# Patient Record
Sex: Female | Born: 1935 | Race: White | Hispanic: No | Smoking: Never smoker
Health system: Southern US, Community
[De-identification: ages and names within clinical notes are randomized; demographics above are authoritative.]

## PROBLEM LIST (undated history)

## (undated) DIAGNOSIS — R42 Dizziness and giddiness: Secondary | ICD-10-CM

## (undated) DIAGNOSIS — E785 Hyperlipidemia, unspecified: Secondary | ICD-10-CM

## (undated) DIAGNOSIS — R112 Nausea with vomiting, unspecified: Secondary | ICD-10-CM

## (undated) DIAGNOSIS — Z9889 Other specified postprocedural states: Secondary | ICD-10-CM

## (undated) DIAGNOSIS — M8580 Other specified disorders of bone density and structure, unspecified site: Secondary | ICD-10-CM

## (undated) DIAGNOSIS — C029 Malignant neoplasm of tongue, unspecified: Secondary | ICD-10-CM

## (undated) DIAGNOSIS — Z860100 Personal history of colon polyps, unspecified: Secondary | ICD-10-CM

## (undated) DIAGNOSIS — E039 Hypothyroidism, unspecified: Secondary | ICD-10-CM

## (undated) DIAGNOSIS — H353190 Nonexudative age-related macular degeneration, unspecified eye, stage unspecified: Secondary | ICD-10-CM

## (undated) DIAGNOSIS — R7303 Prediabetes: Secondary | ICD-10-CM

## (undated) HISTORY — DX: Personal history of colonic polyps: Z86.010

## (undated) HISTORY — DX: Malignant neoplasm of tongue, unspecified: C02.9

## (undated) HISTORY — PX: TONSILLECTOMY AND ADENOIDECTOMY: SUR1326

## (undated) HISTORY — DX: Hyperlipidemia, unspecified: E78.5

## (undated) HISTORY — PX: COLONOSCOPY W/ POLYPECTOMY: SHX1380

## (undated) HISTORY — DX: Personal history of colon polyps, unspecified: Z86.0100

## (undated) HISTORY — PX: PILONIDAL CYST EXCISION: SHX744

## (undated) HISTORY — PX: TOTAL ABDOMINAL HYSTERECTOMY: SHX209

## (undated) HISTORY — DX: Other specified disorders of bone density and structure, unspecified site: M85.80

## (undated) HISTORY — DX: Hypothyroidism, unspecified: E03.9

## (undated) HISTORY — DX: Nonexudative age-related macular degeneration, unspecified eye, stage unspecified: H35.3190

---

## 1998-02-22 ENCOUNTER — Other Ambulatory Visit: Admission: RE | Admit: 1998-02-22 | Discharge: 1998-02-22 | Payer: Self-pay | Admitting: Obstetrics and Gynecology

## 1999-07-12 ENCOUNTER — Other Ambulatory Visit: Admission: RE | Admit: 1999-07-12 | Discharge: 1999-07-12 | Payer: Self-pay | Admitting: Obstetrics and Gynecology

## 2000-11-06 ENCOUNTER — Other Ambulatory Visit: Admission: RE | Admit: 2000-11-06 | Discharge: 2000-11-06 | Payer: Self-pay | Admitting: Obstetrics and Gynecology

## 2001-11-07 ENCOUNTER — Other Ambulatory Visit: Admission: RE | Admit: 2001-11-07 | Discharge: 2001-11-07 | Payer: Self-pay | Admitting: Obstetrics and Gynecology

## 2004-09-22 ENCOUNTER — Ambulatory Visit: Payer: Self-pay | Admitting: Internal Medicine

## 2004-10-04 ENCOUNTER — Ambulatory Visit: Payer: Self-pay | Admitting: Internal Medicine

## 2005-07-06 ENCOUNTER — Ambulatory Visit: Payer: Self-pay | Admitting: Internal Medicine

## 2005-10-06 ENCOUNTER — Ambulatory Visit: Payer: Self-pay | Admitting: Internal Medicine

## 2006-10-01 ENCOUNTER — Ambulatory Visit: Payer: Self-pay | Admitting: Internal Medicine

## 2006-10-01 LAB — CONVERTED CEMR LAB
ALT: 20 units/L (ref 0–40)
AST: 25 units/L (ref 0–37)
Total CHOL/HDL Ratio: 2.7

## 2006-12-19 ENCOUNTER — Ambulatory Visit: Payer: Self-pay | Admitting: Internal Medicine

## 2007-02-15 ENCOUNTER — Telehealth (INDEPENDENT_AMBULATORY_CARE_PROVIDER_SITE_OTHER): Payer: Self-pay | Admitting: *Deleted

## 2007-02-18 DIAGNOSIS — M8580 Other specified disorders of bone density and structure, unspecified site: Secondary | ICD-10-CM

## 2007-03-12 ENCOUNTER — Encounter: Payer: Self-pay | Admitting: Internal Medicine

## 2007-07-15 ENCOUNTER — Telehealth (INDEPENDENT_AMBULATORY_CARE_PROVIDER_SITE_OTHER): Payer: Self-pay | Admitting: *Deleted

## 2007-08-29 ENCOUNTER — Telehealth (INDEPENDENT_AMBULATORY_CARE_PROVIDER_SITE_OTHER): Payer: Self-pay | Admitting: *Deleted

## 2007-09-12 HISTORY — PX: CATARACT EXTRACTION: SUR2

## 2007-11-19 ENCOUNTER — Ambulatory Visit: Payer: Self-pay | Admitting: Internal Medicine

## 2007-11-19 DIAGNOSIS — E782 Mixed hyperlipidemia: Secondary | ICD-10-CM

## 2007-11-19 DIAGNOSIS — E7800 Pure hypercholesterolemia, unspecified: Secondary | ICD-10-CM | POA: Insufficient documentation

## 2007-11-19 DIAGNOSIS — E039 Hypothyroidism, unspecified: Secondary | ICD-10-CM

## 2007-11-26 ENCOUNTER — Encounter (INDEPENDENT_AMBULATORY_CARE_PROVIDER_SITE_OTHER): Payer: Self-pay | Admitting: *Deleted

## 2008-01-23 ENCOUNTER — Telehealth (INDEPENDENT_AMBULATORY_CARE_PROVIDER_SITE_OTHER): Payer: Self-pay | Admitting: *Deleted

## 2008-02-14 ENCOUNTER — Telehealth (INDEPENDENT_AMBULATORY_CARE_PROVIDER_SITE_OTHER): Payer: Self-pay | Admitting: *Deleted

## 2008-03-06 ENCOUNTER — Encounter: Payer: Self-pay | Admitting: Internal Medicine

## 2008-03-18 ENCOUNTER — Telehealth (INDEPENDENT_AMBULATORY_CARE_PROVIDER_SITE_OTHER): Payer: Self-pay | Admitting: *Deleted

## 2008-04-27 ENCOUNTER — Telehealth (INDEPENDENT_AMBULATORY_CARE_PROVIDER_SITE_OTHER): Payer: Self-pay | Admitting: *Deleted

## 2008-06-18 ENCOUNTER — Ambulatory Visit: Payer: Self-pay | Admitting: Internal Medicine

## 2008-10-20 ENCOUNTER — Telehealth (INDEPENDENT_AMBULATORY_CARE_PROVIDER_SITE_OTHER): Payer: Self-pay | Admitting: *Deleted

## 2008-10-30 ENCOUNTER — Telehealth (INDEPENDENT_AMBULATORY_CARE_PROVIDER_SITE_OTHER): Payer: Self-pay | Admitting: *Deleted

## 2008-10-30 ENCOUNTER — Encounter: Payer: Self-pay | Admitting: Internal Medicine

## 2008-11-06 ENCOUNTER — Ambulatory Visit: Payer: Self-pay | Admitting: Internal Medicine

## 2008-11-06 DIAGNOSIS — T887XXA Unspecified adverse effect of drug or medicament, initial encounter: Secondary | ICD-10-CM | POA: Insufficient documentation

## 2008-11-06 LAB — CONVERTED CEMR LAB
Glucose, Urine, Semiquant: NEGATIVE
Specific Gravity, Urine: <1.005
WBC Urine, dipstick: NEGATIVE
pH: 7.5

## 2008-11-11 DIAGNOSIS — Z9849 Cataract extraction status, unspecified eye: Secondary | ICD-10-CM

## 2008-11-16 LAB — CONVERTED CEMR LAB
AST: 25 units/L (ref 0–37)
Albumin: 3.9 g/dL (ref 3.5–5.2)
Alkaline Phosphatase: 54 units/L (ref 39–117)
BUN: 16 mg/dL (ref 6–23)
Basophils Relative: 0.3 % (ref 0.0–3.0)
Chloride: 97 meq/L (ref 96–112)
Creatinine, Ser: 0.8 mg/dL (ref 0.4–1.2)
Eosinophils Absolute: 0.2 10*3/uL (ref 0.0–0.7)
Eosinophils Relative: 3 % (ref 0.0–5.0)
GFR calc non Af Amer: 74 mL/min
Glucose, Bld: 95 mg/dL (ref 70–99)
HCT: 37.5 % (ref 36.0–46.0)
MCV: 95.3 fL (ref 78.0–100.0)
Monocytes Absolute: 0.7 10*3/uL (ref 0.1–1.0)
Monocytes Relative: 9.6 % (ref 3.0–12.0)
RBC: 3.94 M/uL (ref 3.87–5.11)
TSH: 1.89 microintl units/mL (ref 0.35–5.50)
Total CHOL/HDL Ratio: 2.9
Total Protein: 7.4 g/dL (ref 6.0–8.3)
WBC: 7.5 10*3/uL (ref 4.5–10.5)

## 2008-11-17 ENCOUNTER — Encounter (INDEPENDENT_AMBULATORY_CARE_PROVIDER_SITE_OTHER): Payer: Self-pay | Admitting: *Deleted

## 2008-11-17 ENCOUNTER — Telehealth (INDEPENDENT_AMBULATORY_CARE_PROVIDER_SITE_OTHER): Payer: Self-pay | Admitting: *Deleted

## 2008-11-17 ENCOUNTER — Ambulatory Visit: Payer: Self-pay | Admitting: Internal Medicine

## 2008-11-17 DIAGNOSIS — Z860100 Personal history of colon polyps, unspecified: Secondary | ICD-10-CM | POA: Insufficient documentation

## 2008-11-17 DIAGNOSIS — E559 Vitamin D deficiency, unspecified: Secondary | ICD-10-CM

## 2009-01-14 ENCOUNTER — Telehealth (INDEPENDENT_AMBULATORY_CARE_PROVIDER_SITE_OTHER): Payer: Self-pay | Admitting: *Deleted

## 2009-03-18 ENCOUNTER — Ambulatory Visit: Payer: Self-pay | Admitting: Internal Medicine

## 2009-03-23 ENCOUNTER — Encounter (INDEPENDENT_AMBULATORY_CARE_PROVIDER_SITE_OTHER): Payer: Self-pay | Admitting: *Deleted

## 2009-03-23 LAB — CONVERTED CEMR LAB
AST: 22 units/L (ref 0–37)
Albumin: 3.8 g/dL (ref 3.5–5.2)
Alkaline Phosphatase: 45 units/L (ref 39–117)
Bilirubin, Direct: 0.1 mg/dL (ref 0.0–0.3)
LDL Cholesterol: 64 mg/dL (ref 0–99)
Total Bilirubin: 0.8 mg/dL (ref 0.3–1.2)
Total CHOL/HDL Ratio: 2
Triglycerides: 107 mg/dL (ref 0.0–149.0)

## 2009-05-31 ENCOUNTER — Telehealth (INDEPENDENT_AMBULATORY_CARE_PROVIDER_SITE_OTHER): Payer: Self-pay | Admitting: *Deleted

## 2009-09-30 ENCOUNTER — Telehealth (INDEPENDENT_AMBULATORY_CARE_PROVIDER_SITE_OTHER): Payer: Self-pay | Admitting: *Deleted

## 2009-11-30 ENCOUNTER — Telehealth (INDEPENDENT_AMBULATORY_CARE_PROVIDER_SITE_OTHER): Payer: Self-pay | Admitting: *Deleted

## 2010-01-07 ENCOUNTER — Telehealth (INDEPENDENT_AMBULATORY_CARE_PROVIDER_SITE_OTHER): Payer: Self-pay | Admitting: *Deleted

## 2010-01-07 ENCOUNTER — Ambulatory Visit: Payer: Self-pay | Admitting: Internal Medicine

## 2010-01-10 ENCOUNTER — Telehealth (INDEPENDENT_AMBULATORY_CARE_PROVIDER_SITE_OTHER): Payer: Self-pay | Admitting: *Deleted

## 2010-01-14 ENCOUNTER — Ambulatory Visit: Payer: Self-pay | Admitting: Internal Medicine

## 2010-01-24 LAB — CONVERTED CEMR LAB
ALT: 17 units/L (ref 0–35)
BUN: 12 mg/dL (ref 6–23)
Basophils Relative: 0.6 % (ref 0.0–3.0)
Bilirubin, Direct: 0 mg/dL (ref 0.0–0.3)
CO2: 31 meq/L (ref 19–32)
Calcium: 9.9 mg/dL (ref 8.4–10.5)
Eosinophils Relative: 3.7 % (ref 0.0–5.0)
Glucose, Bld: 95 mg/dL (ref 70–99)
HCT: 38 % (ref 36.0–46.0)
HDL: 65.4 mg/dL (ref >39.00)
Hemoglobin: 12.7 g/dL (ref 12.0–15.0)
LDL Cholesterol: 65 mg/dL (ref 0–99)
Lymphs Abs: 2.5 10*3/uL (ref 0.7–4.0)
MCV: 97.8 fL (ref 78.0–100.0)
Monocytes Absolute: 0.7 10*3/uL (ref 0.1–1.0)
Monocytes Relative: 10.4 % (ref 3.0–12.0)
Platelets: 246 10*3/uL (ref 150.0–400.0)
RBC: 3.89 M/uL (ref 3.87–5.11)
Sodium: 138 meq/L (ref 135–145)
TSH: 2.16 microintl units/mL (ref 0.35–5.50)
Total Bilirubin: 0.7 mg/dL (ref 0.3–1.2)
Total CHOL/HDL Ratio: 2
VLDL: 24.4 mg/dL (ref 0.0–40.0)
WBC: 6.6 10*3/uL (ref 4.5–10.5)

## 2010-02-14 ENCOUNTER — Telehealth (INDEPENDENT_AMBULATORY_CARE_PROVIDER_SITE_OTHER): Payer: Self-pay | Admitting: *Deleted

## 2010-03-01 ENCOUNTER — Telehealth (INDEPENDENT_AMBULATORY_CARE_PROVIDER_SITE_OTHER): Payer: Self-pay | Admitting: *Deleted

## 2010-03-21 ENCOUNTER — Encounter: Payer: Self-pay | Admitting: Internal Medicine

## 2010-06-22 ENCOUNTER — Ambulatory Visit: Payer: Self-pay | Admitting: Internal Medicine

## 2010-06-22 ENCOUNTER — Telehealth: Payer: Self-pay | Admitting: Internal Medicine

## 2010-07-12 ENCOUNTER — Encounter: Payer: Self-pay | Admitting: Internal Medicine

## 2010-08-15 ENCOUNTER — Encounter: Payer: Self-pay | Admitting: Internal Medicine

## 2010-08-18 ENCOUNTER — Telehealth (INDEPENDENT_AMBULATORY_CARE_PROVIDER_SITE_OTHER): Payer: Self-pay | Admitting: *Deleted

## 2010-08-23 ENCOUNTER — Ambulatory Visit: Payer: Self-pay | Admitting: Family Medicine

## 2010-08-23 ENCOUNTER — Encounter: Payer: Self-pay | Admitting: Internal Medicine

## 2010-08-23 ENCOUNTER — Ambulatory Visit: Payer: Self-pay | Admitting: Internal Medicine

## 2010-08-23 DIAGNOSIS — IMO0002 Reserved for concepts with insufficient information to code with codable children: Secondary | ICD-10-CM

## 2010-08-29 ENCOUNTER — Emergency Department (HOSPITAL_BASED_OUTPATIENT_CLINIC_OR_DEPARTMENT_OTHER)
Admission: EM | Admit: 2010-08-29 | Discharge: 2010-08-29 | Payer: Self-pay | Source: Home / Self Care | Admitting: Emergency Medicine

## 2010-08-29 ENCOUNTER — Ambulatory Visit: Payer: Self-pay | Admitting: Family Medicine

## 2010-08-30 ENCOUNTER — Ambulatory Visit: Payer: Self-pay | Admitting: Internal Medicine

## 2010-09-01 LAB — CONVERTED CEMR LAB
Albumin: 3.2 g/dL — ABNORMAL LOW (ref 3.5–5.2)
Alkaline Phosphatase: 70 units/L (ref 39–117)
Basophils Relative: 0.5 % (ref 0.0–3.0)
CO2: 31 meq/L (ref 19–32)
Chloride: 96 meq/L (ref 96–112)
Eosinophils Absolute: 0.3 10*3/uL (ref 0.0–0.7)
Glucose, Bld: 101 mg/dL — ABNORMAL HIGH (ref 70–99)
HCT: 34.9 % — ABNORMAL LOW (ref 36.0–46.0)
HDL: 48.3 mg/dL (ref >39.00)
Hemoglobin: 11.9 g/dL — ABNORMAL LOW (ref 12.0–15.0)
LDL Cholesterol: 53 mg/dL (ref 0–99)
Lymphs Abs: 2.1 10*3/uL (ref 0.7–4.0)
MCHC: 33.9 g/dL (ref 30.0–36.0)
MCV: 96.5 fL (ref 78.0–100.0)
Monocytes Absolute: 1.1 10*3/uL — ABNORMAL HIGH (ref 0.1–1.0)
Neutro Abs: 10 10*3/uL — ABNORMAL HIGH (ref 1.4–7.7)
RBC: 3.62 M/uL — ABNORMAL LOW (ref 3.87–5.11)
RDW: 12.6 % (ref 11.5–14.6)
Sodium: 137 meq/L (ref 135–145)
Total Bilirubin: 0.5 mg/dL (ref 0.3–1.2)
Total CHOL/HDL Ratio: 2
Triglycerides: 92 mg/dL (ref 0.0–149.0)

## 2010-09-02 ENCOUNTER — Ambulatory Visit: Payer: Self-pay | Admitting: Internal Medicine

## 2010-09-02 DIAGNOSIS — D649 Anemia, unspecified: Secondary | ICD-10-CM | POA: Insufficient documentation

## 2010-09-02 DIAGNOSIS — F411 Generalized anxiety disorder: Secondary | ICD-10-CM | POA: Insufficient documentation

## 2010-09-02 LAB — CONVERTED CEMR LAB
Eosinophils Absolute: 0.1 10*3/uL (ref 0.0–0.7)
Eosinophils Relative: 0.6 % (ref 0.0–5.0)
Folate: 15.3 ng/mL
HCT: 36.2 % (ref 36.0–46.0)
Iron: 34 ug/dL — ABNORMAL LOW (ref 42–145)
Lymphs Abs: 1.6 10*3/uL (ref 0.7–4.0)
MCHC: 33.4 g/dL (ref 30.0–36.0)
MCV: 96.3 fL (ref 78.0–100.0)
Monocytes Absolute: 1.2 10*3/uL — ABNORMAL HIGH (ref 0.1–1.0)
Platelets: 496 10*3/uL — ABNORMAL HIGH (ref 150.0–400.0)
RDW: 12.6 % (ref 11.5–14.6)
WBC: 16.4 10*3/uL — ABNORMAL HIGH (ref 4.5–10.5)

## 2010-09-07 ENCOUNTER — Ambulatory Visit
Admission: RE | Admit: 2010-09-07 | Discharge: 2010-09-07 | Payer: Self-pay | Source: Home / Self Care | Attending: Internal Medicine | Admitting: Internal Medicine

## 2010-09-07 LAB — CONVERTED CEMR LAB
OCCULT 2: NEGATIVE
OCCULT 3: NEGATIVE

## 2010-09-08 ENCOUNTER — Ambulatory Visit: Payer: Self-pay | Admitting: Internal Medicine

## 2010-09-08 ENCOUNTER — Telehealth (INDEPENDENT_AMBULATORY_CARE_PROVIDER_SITE_OTHER): Payer: Self-pay | Admitting: *Deleted

## 2010-09-08 ENCOUNTER — Encounter (INDEPENDENT_AMBULATORY_CARE_PROVIDER_SITE_OTHER): Payer: Self-pay | Admitting: *Deleted

## 2010-09-14 ENCOUNTER — Telehealth: Payer: Self-pay | Admitting: Internal Medicine

## 2010-09-19 ENCOUNTER — Ambulatory Visit
Admission: RE | Admit: 2010-09-19 | Discharge: 2010-09-19 | Payer: Self-pay | Source: Home / Self Care | Attending: Internal Medicine | Admitting: Internal Medicine

## 2010-09-21 ENCOUNTER — Telehealth: Payer: Self-pay | Admitting: Internal Medicine

## 2010-09-23 ENCOUNTER — Encounter: Payer: Self-pay | Admitting: Internal Medicine

## 2010-09-26 ENCOUNTER — Encounter
Admission: RE | Admit: 2010-09-26 | Discharge: 2010-09-26 | Payer: Self-pay | Source: Home / Self Care | Attending: Otolaryngology | Admitting: Otolaryngology

## 2010-10-09 LAB — CONVERTED CEMR LAB
Alkaline Phosphatase: 49 units/L (ref 39–117)
BUN: 16 mg/dL (ref 6–23)
Basophils Relative: 0.6 % (ref 0.0–1.0)
Bilirubin, Direct: 0.1 mg/dL (ref 0.0–0.3)
CO2: 32 meq/L (ref 19–32)
Cholesterol: 147 mg/dL (ref 0–200)
GFR calc Af Amer: 78 mL/min
Glucose, Bld: 96 mg/dL (ref 70–99)
HDL: 65 mg/dL (ref >39.0)
Hemoglobin: 12.8 g/dL (ref 12.0–15.0)
Lymphocytes Relative: 31.4 % (ref 12.0–46.0)
Monocytes Absolute: 0.6 10*3/uL (ref 0.2–0.7)
Monocytes Relative: 9.4 % (ref 3.0–11.0)
Neutro Abs: 4 10*3/uL (ref 1.4–7.7)
Potassium: 4.1 meq/L (ref 3.5–5.1)
Sodium: 140 meq/L (ref 135–145)
TSH: 1.73 microintl units/mL (ref 0.35–5.50)
Total Protein: 7.6 g/dL (ref 6.0–8.3)
VLDL: 26 mg/dL (ref 0–40)
Vit D, 1,25-Dihydroxy: 35 (ref 30–89)

## 2010-10-11 NOTE — Assessment & Plan Note (Signed)
Summary: FLU SHOT///SPH   Nurse Visit  CC: Flu shot./kb   Allergies: No Known Drug Allergies  Orders Added: 1)  Flu Vaccine 3yrs + MEDICARE PATIENTS [Q2039] 2)  Administration Flu vaccine - MCR [G0008]            Flu Vaccine Consent Questions     Do you have a history of severe allergic reactions to this vaccine? no    Any prior history of allergic reactions to egg and/or gelatin? no    Do you have a sensitivity to the preservative Thimersol? no    Do you have a past history of Guillan-Barre Syndrome? no    Do you currently have an acute febrile illness? no    Have you ever had a severe reaction to latex? no    Vaccine information given and explained to patient? yes    Are you currently pregnant? no    Lot Number:[id number]   Exp Date:03/11/2011   Site Given  Left Deltoid IMu 

## 2010-10-11 NOTE — Progress Notes (Signed)
Summary: Zosta vacc  Phone Note Other Incoming   Summary of Call: Patient was in the office for flu shot and has requested Zosta vacc. I made the patient aware to check with her insurance first and then schedule appt. Patient will call back with her insurance recommendations. Initial call taken by: Lucious Groves CMA,  June 22, 2010 9:17 AM

## 2010-10-11 NOTE — Assessment & Plan Note (Signed)
Summary: tetnus inj/cbs  Nurse Visit   Allergies: No Known Drug Allergies  Immunizations Administered:  Tetanus Vaccine:    Vaccine Type: Tdap    Site: right deltoid    Mfr: GlaxoSmithKline    Dose: 0.5 ml    Route: IM    Given by: Jeremy Johann CMA    Exp. Date: 12/04/2011    Lot #: [id number]    VIS given: 07/30/07 version given January 07, 2010.  Orders Added: 1)  Tdap => 56yrs IM [90715] 2)  Admin 1st Vaccine [09811]

## 2010-10-11 NOTE — Progress Notes (Signed)
Summary: refill  Phone Note Refill Request Message from:  Fax from Pharmacy on November 30, 2009 11:05 AM  Refills Requested: Medication #1:  SIMVASTATIN 40 MG TABS 1 by mouth once daily [facility] fax [phone]   Method Requested: Fax to Local Pharmacy Next Appointment Scheduled: no appt Initial call taken by: Barb Merino,  November 30, 2009 11:06 AM    Prescriptions: SIMVASTATIN 40 MG TABS (SIMVASTATIN) 1 by mouth once daily  #90 x 0   Entered by:   Kandice Hams   Authorized by:   Marga Melnick MD   Signed by:   Kandice Hams on 11/30/2009   Method used:   Faxed to ...       [facility] (retail)       [address] [place]       [address]       [place], [place]  [address]       Ph: [phone]       Fax: [phone]   RxID:   [id number]

## 2010-10-11 NOTE — Progress Notes (Signed)
Summary: need lab orders for 12/13  Phone Note Call from Patient   Summary of Call: has CPX on 12/20---will have labs on 12/13----what orders and codes do you want to use???   thanks Initial call taken by: Jerolyn Shin,  August 18, 2010 2:15 PM  Follow-up for Phone Call        Please make sure paitent checked with insurance to verify labs covered if done prior to appointment  Lipid,Hep,BMP,CBCD,TSH,Stool cards, Vit D: V70.0, 244.9,272.2,268.9 Follow-up by: Shonna Chock CMA,  August 18, 2010 4:06 PM  Additional Follow-up for Phone Call Additional follow up Details #1::        entered lab info Additional Follow-up by: Jerolyn Shin,  August 19, 2010 10:54 AM

## 2010-10-11 NOTE — Progress Notes (Signed)
Summary: Refill Request  Phone Note Refill Request Call back at [phone] Message from:  Pharmacy on Jan 10, 2010 9:15 AM  Refills Requested: Medication #1:  SYNTHROID 88 MCG TABS 1 tab by mouth once daily except 1/2 tab q wed   Dosage confirmed as above?Dosage Confirmed   Supply Requested: 1 month   Last Refilled: 12/06/2009 [facility]  Next Appointment Scheduled: none Initial call taken by: Harold Barban,  Jan 10, 2010 9:15 AM    Prescriptions: SYNTHROID 88 MCG TABS (LEVOTHYROXINE SODIUM) 1 tab by mouth once daily except 1/2 tab q wed  #30 x 0   Entered by:   Shonna Chock   Authorized by:   Marga Melnick MD   Signed by:   Shonna Chock on 01/10/2010   Method used:   Printed then faxed to ...       [facility] (retail)       [address] [place]       [address]       [place], [place]  [address]       Ph: [phone]       Fax: [phone]   RxID:   [id number]

## 2010-10-11 NOTE — Progress Notes (Signed)
Summary: Refill Request  Phone Note Refill Request Call back at [phone] Message from:  Pharmacy on March 01, 2010 2:36 PM  Refills Requested: Medication #1:  SIMVASTATIN 40 MG TABS 1 by mouth once daily   Dosage confirmed as above?Dosage Confirmed   Supply Requested: 3 months   Last Refilled: 01/26/2010 [facility]  Next Appointment Scheduled: none Initial call taken by: Harold Barban,  March 01, 2010 2:36 PM    Prescriptions: SIMVASTATIN 40 MG TABS (SIMVASTATIN) 1 by mouth once daily  #90 x 3   Entered by:   Shonna Chock   Authorized by:   Marga Melnick MD   Signed by:   Shonna Chock on 03/01/2010   Method used:   Faxed to ...       [facility] (retail)       [address] [place]       [address]       [place], [place]  [address]       Ph: [phone]       Fax: [phone]   RxID:   [id number]

## 2010-10-11 NOTE — Progress Notes (Signed)
Summary: lab appt [phone]  Phone Note Call from Patient Call back at Home Phone [phone]   Caller: Patient Summary of Call: patient said she is due labs ---last lab was 0710 -  need lab order   Initial call taken by: Okey Regal [place],  January 07, 2010 12:13 PM  Follow-up for Phone Call        Lipid,Hep,TSH,CBCD,Vit D, BMP 995.20/244.9/995.20/272.4 Follow-up by: Shonna Chock,  Jan 10, 2010 4:17 PM  Additional Follow-up for Phone Call Additional follow up Details #1::        lm am for patient to call to schedule lab appt Additional Follow-up by: Okey Regal [place],  Jan 11, 2010 8:16 AM    Additional Follow-up for Phone Call Additional follow up Details #2::    patient returned call lab appt scheduled [id number] Follow-up by: Okey Regal Spring,  Jan 11, 2010 10:46 AM

## 2010-10-11 NOTE — Progress Notes (Signed)
Summary: Refill Request  Phone Note Refill Request Call back at [phone] Message from:  Pharmacy on February 14, 2010 10:07 AM  Refills Requested: Medication #1:  SYNTHROID 88 MCG TABS 1 tab by mouth once daily except 1/2 tab q wed   Dosage confirmed as above?Dosage Confirmed   Supply Requested: 1 month   Last Refilled: 01/10/2010 [facility]  Next Appointment Scheduled: none Initial call taken by: Harold Barban,  February 14, 2010 10:07 AM    Prescriptions: SYNTHROID 88 MCG TABS (LEVOTHYROXINE SODIUM) 1 tab by mouth once daily except 1/2 tab q wed  #30 x 11   Entered by:   Shonna Chock   Authorized by:   Marga Melnick MD   Signed by:   Shonna Chock on 02/14/2010   Method used:   Faxed to ...       [facility] (retail)       [address] [place]       [address]       [place], [place]  [address]       Ph: [phone]       Fax: [phone]   RxID:   [id number]

## 2010-10-11 NOTE — Consult Note (Signed)
Summary: [facility]  [facility]   Imported By: Lanelle Bal 07/26/2010 11:08:15  _____________________________________________________________________  External Attachment:    Type:   Image     Comment:   External Document

## 2010-10-11 NOTE — Progress Notes (Signed)
Summary: REFILL  Phone Note Refill Request Message from:  Fax from Pharmacy on [facility] [phone]  Refills Requested: Medication #1:  SYNTHROID 88 MCG TABS 1 tab by mouth once daily except 1/2 tab q wed Initial call taken by: Barb Merino,  September 30, 2009 10:54 AM    Prescriptions: SYNTHROID 88 MCG TABS (LEVOTHYROXINE SODIUM) 1 tab by mouth once daily except 1/2 tab q wed  #30 x 2   Entered by:   Shonna Chock   Authorized by:   Marga Melnick MD   Signed by:   Shonna Chock on 09/30/2009   Method used:   Faxed to ...       [facility] (retail)       [address] [place]       [address]       [place], [place]  [address]       Ph: [phone]       Fax: [phone]   RxID:   [id number]

## 2010-10-13 ENCOUNTER — Encounter: Payer: Self-pay | Admitting: Internal Medicine

## 2010-10-13 NOTE — Progress Notes (Signed)
Summary: Iron question  Phone Note Call from Patient Call back at Home Phone [phone] Call back at [phone]   Summary of Call: Patient left message on triage noting that she is taking Slow FE iron and that it is upsetting her stomach. Patient would like to know if it is ok to stop it and if MD has other recommendations. Please advise. Initial call taken by: Lucious Groves CMA,  September 21, 2010 10:58 AM  Follow-up for Phone Call        stop it but recheck CBC , iron panel in 6 weeks (285.9). Take an OTC  MVI with iron once daily  Follow-up by: Marga Melnick MD,  September 21, 2010 11:19 AM  Additional Follow-up for Phone Call Additional follow up Details #1::        Patient notified. Additional Follow-up by: Lucious Groves CMA,  September 21, 2010 11:27 AM

## 2010-10-13 NOTE — Assessment & Plan Note (Signed)
Summary: INSECT BITE/CBS   Vital Signs:  Patient profile:   77 year old female Weight:      115 pounds Temp:     98.5 degrees F oral BP sitting:   118 / 70  (left arm)  Vitals Entered By: Doristine Devoid CMA (August 23, 2010 11:12 AM) CC: L upper arm insect bite x2 days no pain just itchy    History of Present Illness: 77 yo woman here today for bug bite.  first noticed 2 days ago.  L upper arm.  no pain.  + itching.  doesn't feel redness is spreading.  using Neosporin w/out relief.  no other lesions.  Current Medications (verified): 1)  Simvastatin 40 Mg Tabs (Simvastatin) .Marland Kitchen.. 1 By Mouth Once Daily 2)  Synthroid 88 Mcg Tabs (Levothyroxine Sodium) .Marland Kitchen.. 1 Tab By Mouth Once Daily Except 1/2 Tab Q Wed 3)  Caltrate 600  Tabs (Calcium Carbonate Tabs) .... Two Times A Day 4)  Centrum Silver  Tabs (Multiple Vitamins-Minerals) 5)  Preservision/lutein  Caps (Multiple Vitamins-Minerals) .... Daily  Allergies (verified): No Known Drug Allergies  Review of Systems      See HPI  Physical Exam  General:  Well-developed,well-nourished,in no acute distress; alert,appropriate and cooperative throughout examination Skin:  1.5 in x 2 in area of erythema on L upper arm w/ 2 central punctures.  small area of streaking redness superiorly.  no TTP, no induration, fluctuance, or drainage   Impression & Recommendations:  Problem # 1:  CELLULITIS/ABSCESS, ARM (ICD-682.3) Assessment New start abx for cellulitis.  reviewed supportive care and red flags that should prompt return.  Pt expresses understanding and is in agreement w/ this plan. Her updated medication list for this problem includes:    Cephalexin 500 Mg Tabs (Cephalexin) .Marland Kitchen... Take one by mouth 2 times daily for the skin infection  Complete Medication List: 1)  Simvastatin 40 Mg Tabs (Simvastatin) .Marland Kitchen.. 1 by mouth once daily 2)  Synthroid 88 Mcg Tabs (Levothyroxine sodium) .Marland Kitchen.. 1 tab by mouth once daily except 1/2 tab q wed 3)  Caltrate  600 Tabs (Calcium carbonate tabs) .... Two times a day 4)  Centrum Silver Tabs (Multiple vitamins-minerals) 5)  Preservision/lutein Caps (Multiple vitamins-minerals) .... Daily 6)  Cephalexin 500 Mg Tabs (Cephalexin) .... Take one by mouth 2 times daily for the skin infection  Patient Instructions: 1)  This appears to be a spider bite 2)  The Cephalexin will prevent any infection- take twice a day with food to avoid upset stomach 3)  Use Benadryl cream or hydrocortisone cream (both over the counter) as needed for itching 4)  Call with any questions or concerns- especially if the redness is spreading 5)  Hang in there! 6)  Happy Holidays! Prescriptions: CEPHALEXIN 500 MG  TABS (CEPHALEXIN) take one by mouth 2 times daily for the skin infection  #14 x 0   Entered and Authorized by:   Neena Rhymes MD   Signed by:   Neena Rhymes MD on 08/23/2010   Method used:   Faxed to ...       [facility] (retail)       [address] [place]       [address]       [place], [place]  [address]       Ph: [phone]       Fax: [phone]   RxID:   [id number]    Orders Added: 1)  Est. Patient Level III [84132]

## 2010-10-13 NOTE — Progress Notes (Signed)
Summary: RX request  Phone Note Call from Patient Call back at Home Phone [phone]   Details for Reason: [facility] Summary of Call: Patient left message on triage that she would like something called in to help increase her appetitie. Patient notes that she has to make herself eat b/c she does not feel hungry of have a desire to eat. Please advise. Initial call taken by: Lucious Groves CMA,  September 14, 2010 9:37 AM  Follow-up for Phone Call        see Rx for Citalopram Follow-up by: Marga Melnick MD,  September 14, 2010 1:46 PM  Additional Follow-up for Phone Call Additional follow up Details #1::        Patient aware. Additional Follow-up by: Lucious Groves CMA,  September 14, 2010 2:44 PM    New/Updated Medications: CITALOPRAM HYDROBROMIDE 10 MG TABS (CITALOPRAM HYDROBROMIDE) 1 once daily Prescriptions: CITALOPRAM HYDROBROMIDE 10 MG TABS (CITALOPRAM HYDROBROMIDE) 1 once daily  #30 x 2   Entered by:   Lucious Groves CMA   Authorized by:   Marga Melnick MD   Signed by:   Lucious Groves CMA on 09/14/2010   Method used:   Electronically to        [id number] (retail)       [address]       [place], [place]  [address]       Ph: [phone]       Fax: [phone]   RxID:   [id number]

## 2010-10-13 NOTE — Assessment & Plan Note (Addendum)
Summary: 3 WEEK RTO/KN   Vital Signs:  Patient profile:   77 year old female Weight:      112.2 pounds Temp:     98.3 degrees F oral Pulse rate:   76 / minute Resp:     12 per minute BP sitting:   140 / 78  (left arm) Cuff size:   regular  Vitals Entered By: Shonna Chock CMA (September 19, 2010 3:23 PM) CC: 3 week follow-up: c/o weight loss ( didnt start Citalopram yet)    CC:  3 week follow-up: c/o weight loss ( didnt start Citalopram yet) .  History of Present Illness:   Ongoing anxiety; she did not start Citalopram  fearing it would make her sleepy. The pathophysiology of Serotonin deficiency diagrammed  & discussed with her & her daughter -in -law. xanax is helping her sleep.Counselling encompassed > 50 % of appt.  Current Medications (verified): 1)  Simvastatin 40 Mg Tabs (Simvastatin) .... 1/2  By Mouth Once Daily 2)  Synthroid 88 Mcg Tabs (Levothyroxine Sodium) .Marland Kitchen.. 1 Tab By Mouth Once Daily Except 1/2 Tab Q Wed 3)  Caltrate 600  Tabs (Calcium Carbonate Tabs) .... Two Times A Day 4)  Centrum Silver  Tabs (Multiple Vitamins-Minerals) 5)  Preservision/lutein  Caps (Multiple Vitamins-Minerals) .... Daily 6)  Amoxicillin 875 Mg Tabs (Amoxicillin) .Marland Kitchen.. 1 By Mouth Two Times A Day 7)  Vitamin D3 1000 Unit Tabs (Cholecalciferol) .Marland Kitchen.. 1 By Mouth Once Daily 8)  Ciprodex 0.3-0.1 % Susp (Ciprofloxacin-Dexamethasone) .... 4 Drops Two Times A Day in Both Ears 9)  Lorazepam 0.5 Mg Tabs (Lorazepam) .Marland Kitchen.. 1 Every 8 Hrs As Needed For Anxiety 10)  Citalopram Hydrobromide 10 Mg Tabs (Citalopram Hydrobromide) .Marland Kitchen.. 1 Once Daily  Allergies (verified): No Known Drug Allergies  Physical Exam  General:  Thin ,in no acute distress; alert,appropriate and cooperative throughout examination Eyes:  No lid lag  Heart:  Normal rate and regular rhythm. S1 and S2 normal without gallop, murmur, click, rub. S4 Neurologic:  alert & oriented X3 and DTRs symmetrical and normal.   No tremor Cervical Nodes:  No  lymphadenopathy noted Axillary Nodes:  No palpable lymphadenopathy Psych:  Oriented X3 and slightly anxious.     Impression & Recommendations:  Problem # 1:  ANXIETY STATE, UNSPECIFIED (ICD-300.00)  Her updated medication list for this problem includes:    Lorazepam 0.5 Mg Tabs (Lorazepam) .Marland Kitchen... 1 every 8 hrs as needed for anxiety    Citalopram Hydrobromide 10 Mg Tabs (Citalopram hydrobromide) .Marland Kitchen... 1 once daily  Complete Medication List: 1)  Simvastatin 40 Mg Tabs (Simvastatin) .... 1/2  by mouth once daily 2)  Synthroid 88 Mcg Tabs (Levothyroxine sodium) .Marland Kitchen.. 1 tab by mouth once daily except 1/2 tab q wed 3)  Caltrate 600 Tabs (Calcium carbonate tabs) .... Two times a day 4)  Centrum Silver Tabs (Multiple vitamins-minerals) 5)  Preservision/lutein Caps (Multiple vitamins-minerals) .... Daily 6)  Amoxicillin 875 Mg Tabs (Amoxicillin) .Marland Kitchen.. 1 by mouth two times a day 7)  Vitamin D3 1000 Unit Tabs (Cholecalciferol) .Marland Kitchen.. 1 by mouth once daily 8)  Ciprodex 0.3-0.1 % Susp (Ciprofloxacin-dexamethasone) .... 4 drops two times a day in both ears 9)  Lorazepam 0.5 Mg Tabs (Lorazepam) .Marland Kitchen.. 1 every 8 hrs as needed for anxiety 10)  Citalopram Hydrobromide 10 Mg Tabs (Citalopram hydrobromide) .Marland Kitchen.. 1 once daily  Patient Instructions: 1)  Assess response  Citalopram 10 mg  once daily .  Review  diagram on Neurotransmitter Deficiency.  Orders Added: 1)  Est. Patient Level III [40981]  Appended Document: 3 WEEK RTO/KN as per Coding instructions: total time 22 minutes(3:39 - 4:01pm). Gretater than 50 % of time spent in counselling patient & her daughter-in -law, Tammy.

## 2010-10-13 NOTE — Letter (Signed)
Summary: Results Follow up Letter  [facility]  [address] [place], [place] [address]   Phone: [phone]  Fax: [phone]    09/08/2010 MRN: [id number]  Jessica Sherman [address] [place], [place]  [address]  Dear Jessica Sherman,  The following are the results of your recent test(s):  Test         Result    Pap Smear:        Normal _____  Not Normal _____ Comments: ______________________________________________________ Cholesterol: LDL(Bad cholesterol):         Your goal is less than:         HDL (Good cholesterol):       Your goal is more than: Comments:  ______________________________________________________ Mammogram:        Normal _____  Not Normal _____ Comments:  ___________________________________________________________________ Hemoccult:        Normal __X___  Not normal _______ Comments:    _____________________________________________________________________ Other Tests:    We routinely do not discuss normal results over the telephone.  If you desire a copy of the results, or you have any questions about this information we can discuss them at your next office visit.   Sincerely,

## 2010-10-13 NOTE — Progress Notes (Signed)
Summary: wants to know what iron supplement to take  Phone Note Call from Patient   Caller: Patient Call For: Marga Melnick MD Details for Reason: wants an iron supplement Summary of Call: Call from patient and she stated her iron had been low and she wanted to get an iron supplement and wanted to know the name of one. I advised she can try OTC Slow Fe. she voiced understanding.... [name] CMA Duncan Dull)  September 08, 2010 4:24 PM

## 2010-10-13 NOTE — Assessment & Plan Note (Signed)
Summary: WEAKNESS/KN   Vital Signs:  Patient profile:   77 year old female Weight:      113 pounds Temp:     98.2 degrees F oral Pulse rate:   76 / minute Resp:     15 per minute BP sitting:   132 / 78  (left arm) Cuff size:   regular  Vitals Entered By: Shonna Chock CMA (September 02, 2010 1:44 PM) CC: 1.) Weak, fatigue, no appetitie and anxiety  2.) Recheck spider bite, Fatigue   CC:  1.) Weak, fatigue, no appetitie and anxiety  2.) Recheck spider bite, and Fatigue.  History of Present Illness:     ER  records reviewed & labs discussed. Mild anemia with with  iron of  34. A1c 6.2%.  The patient reports persistent fatigue and fatigue  even without physical activity.  The patient also reports some  night sweats, weight loss, and  dry cough.  The patient denies fever, exertional chest pain, dyspnea, and hemoptysis.  The patient denies the following symptoms: leg swelling, orthopnea, PND, melena, adenopathy, and skin changes.  The patient  admits to  feeling depressed & anxious due to family stresses.  Last colonoscopy  was in 2009.  Allergies (verified): No Known Drug Allergies  Past History:  Past Medical History: Osteopenia-spine BMD-1.69 Hyperlipidemia Hypothyroidism Colonic polyps, PMH  of Dry Macular Degeneration  Past Surgical History: G4P4 TAH w/ bladder repair T&A Colon--polyps-03/07/2005; polyps 2009 (due 2011),Dr Magod Pilonidal cyst sx. x 3 Cataract extraction OS ,2009  Review of Systems ENT:  Denies nosebleeds. GI:  Denies bloody stools. GU:  Denies abnormal vaginal bleeding and hematuria. Psych:  Complains of anxiety, depression, and easily tearful; denies easily angered and irritability. Heme:  Denies abnormal bruising and bleeding.  Physical Exam  General:  Thin,in no acute distress; alert,appropriate and cooperative throughout examination Neck:  No deformities, masses, or tenderness noted. Lungs:  Normal respiratory effort, chest expands  symmetrically. Lungs are clear to auscultation, no crackles or wheezes. Heart:  Normal rate and regular rhythm. S1 and S2 normal without gallop, murmur, click, rub .S4 with slurring Abdomen:  Bowel sounds positive,abdomen soft and non-tender without masses, organomegaly or hernias noted. Aorta palpable Skin:  Intact without suspicious lesions or rashes Cervical Nodes:  No lymphadenopathy noted Axillary Nodes:  No palpable lymphadenopathy Psych:  Oriented X3, memory intact for recent and remote, and slightly anxious.     Impression & Recommendations:  Problem # 1:  ANEMIA (ICD-285.9) iron deficient  Problem # 2:  COLONIC POLYPS, HX OF (ICD-V12.72)  Problem # 3:  VITAMIN D DEFICIENCY (ICD-268.9) corrected  Problem # 4:  ANXIETY STATE, UNSPECIFIED (ICD-300.00)  Her updated medication list for this problem includes:    Lorazepam 0.5 Mg Tabs (Lorazepam) .Marland Kitchen... 1 every 8 hrs as needed for anxiety  Problem # 5:  HYPERLIPIDEMIA (ICD-272.2)  Her updated medication list for this problem includes:    Simvastatin 40 Mg Tabs (Simvastatin) .Marland Kitchen... 1/2  by mouth once daily  Problem # 6:  HYPOTHYROIDISM (ICD-244.9) TSH therapeutic Her updated medication list for this problem includes:    Synthroid 88 Mcg Tabs (Levothyroxine sodium) .Marland Kitchen... 1 tab by mouth once daily except 1/2 tab q wed  Complete Medication List: 1)  Simvastatin 40 Mg Tabs (Simvastatin) .... 1/2  by mouth once daily 2)  Synthroid 88 Mcg Tabs (Levothyroxine sodium) .Marland Kitchen.. 1 tab by mouth once daily except 1/2 tab q wed 3)  Caltrate 600 Tabs (Calcium carbonate tabs) .... Two  times a day 4)  Centrum Silver Tabs (Multiple vitamins-minerals) 5)  Preservision/lutein Caps (Multiple vitamins-minerals) .... Daily 6)  Amoxicillin 875 Mg Tabs (Amoxicillin) .Marland Kitchen.. 1 by mouth two times a day 7)  Vitamin D3 1000 Unit Tabs (Cholecalciferol) .Marland Kitchen.. 1 by mouth once daily 8)  Ciprodex 0.3-0.1 % Susp (Ciprofloxacin-dexamethasone) .... 4 drops two times a  day in both ears 9)  Lorazepam 0.5 Mg Tabs (Lorazepam) .Marland Kitchen.. 1 every 8 hrs as needed for anxiety  Patient Instructions: 1)  Please  complete & return  stool cards.Repeat CBC & dif  in 2 weeks (285.9). 2)  Please schedule a follow-up appointment in 3 weeks. Prescriptions: LORAZEPAM 0.5 MG TABS (LORAZEPAM) 1 every 8 hrs as needed for anxiety  #30 x 2   Entered and Authorized by:   Marga Melnick MD   Signed by:   Marga Melnick MD on 09/02/2010   Method used:   Print then Give to Patient   RxID:   [id number]    Orders Added: 1)  Est. Patient Level IV [14782]

## 2010-10-13 NOTE — Op Note (Signed)
Summary: [facility]  Myringotomy/[facility]   Imported By: Lanelle Bal 09/07/2010 09:19:33  _____________________________________________________________________  External Attachment:    Type:   Image     Comment:   External Document

## 2010-10-13 NOTE — Consult Note (Signed)
Summary: [facility]  [facility]   Imported By: Lanelle Bal 10/07/2010 14:26:09  _____________________________________________________________________  External Attachment:    Type:   Image     Comment:   External Document

## 2010-10-17 ENCOUNTER — Telehealth: Payer: Self-pay | Admitting: Internal Medicine

## 2010-10-27 NOTE — Progress Notes (Signed)
Summary: Probiotic question  Phone Note Call from Patient Call back at Home Phone [phone]   Summary of Call: Patient left message on triage that she is having issues with energy. She would like to know if she began taking a Probiotic could it help with her decreased energy. Please advise. Initial call taken by: Lucious Groves CMA,  October 17, 2010 1:51 PM  Follow-up for Phone Call        it will normalize bowels if loose or constipated Follow-up by: Marga Melnick MD,  October 17, 2010 2:50 PM  Additional Follow-up for Phone Call Additional follow up Details #1::        Patient notified. Additional Follow-up by: Lucious Groves CMA,  October 17, 2010 3:06 PM

## 2010-11-02 ENCOUNTER — Other Ambulatory Visit: Payer: Self-pay | Admitting: Internal Medicine

## 2010-11-02 ENCOUNTER — Telehealth: Payer: Self-pay | Admitting: Internal Medicine

## 2010-11-02 ENCOUNTER — Encounter (INDEPENDENT_AMBULATORY_CARE_PROVIDER_SITE_OTHER): Payer: Self-pay | Admitting: *Deleted

## 2010-11-02 ENCOUNTER — Other Ambulatory Visit (INDEPENDENT_AMBULATORY_CARE_PROVIDER_SITE_OTHER): Payer: Medicare Other

## 2010-11-02 DIAGNOSIS — D649 Anemia, unspecified: Secondary | ICD-10-CM

## 2010-11-02 LAB — CBC WITH DIFFERENTIAL/PLATELET
Basophils Absolute: 0 10*3/uL (ref 0.0–0.1)
Hemoglobin: 10.5 g/dL — ABNORMAL LOW (ref 12.0–15.0)
Lymphocytes Relative: 11.1 % — ABNORMAL LOW (ref 12.0–46.0)
Monocytes Relative: 9.7 % (ref 3.0–12.0)
Neutro Abs: 10 10*3/uL — ABNORMAL HIGH (ref 1.4–7.7)
Platelets: 575 10*3/uL — ABNORMAL HIGH (ref 150.0–400.0)
RDW: 13.9 % (ref 11.5–14.6)

## 2010-11-02 LAB — IBC PANEL
Iron: 26 ug/dL — ABNORMAL LOW (ref 42–145)
Saturation Ratios: 7.7 % — ABNORMAL LOW (ref 20.0–50.0)

## 2010-11-02 NOTE — Letter (Signed)
Summary: [facility]  [facility]   Imported By: Maryln Gottron 10/24/2010 13:33:12  _____________________________________________________________________  External Attachment:    Type:   Image     Comment:   External Document

## 2010-11-07 ENCOUNTER — Other Ambulatory Visit: Payer: Self-pay | Admitting: Internal Medicine

## 2010-11-07 ENCOUNTER — Encounter: Payer: Self-pay | Admitting: Internal Medicine

## 2010-11-07 ENCOUNTER — Ambulatory Visit (INDEPENDENT_AMBULATORY_CARE_PROVIDER_SITE_OTHER): Payer: Medicare Other | Admitting: Internal Medicine

## 2010-11-07 DIAGNOSIS — D649 Anemia, unspecified: Secondary | ICD-10-CM

## 2010-11-07 DIAGNOSIS — Z86010 Personal history of colonic polyps: Secondary | ICD-10-CM

## 2010-11-07 DIAGNOSIS — Z79899 Other long term (current) drug therapy: Secondary | ICD-10-CM

## 2010-11-07 LAB — B12 AND FOLATE PANEL: Vitamin B-12: 343 pg/mL (ref 211–911)

## 2010-11-08 NOTE — Progress Notes (Signed)
Summary: Shingles vacc  Phone Note Call from Patient Call back at Home Phone [phone]   Summary of Call: Patient called back noting that the shingles vacc is cheaper for her at [facility]. Patient would like prescription sent to [facility]. Please advise. Initial call taken by: Lucious Groves CMA,  November 02, 2010 12:00 PM  Follow-up for Phone Call        OK Follow-up by: Marga Melnick MD,  November 02, 2010 3:01 PM  Additional Follow-up for Phone Call Additional follow up Details #1::        Rx sent.  Additional Follow-up by: Lucious Groves CMA,  November 02, 2010 3:09 PM    New/Updated Medications: ZOSTAVAX 09811 UNT/0.65ML SOLR (ZOSTER VACCINE LIVE) administer as directed Prescriptions: ZOSTAVAX 91478 UNT/0.65ML SOLR (ZOSTER VACCINE LIVE) administer as directed  #1 x 0   Entered by:   Lucious Groves CMA   Authorized by:   Marga Melnick MD   Signed by:   Lucious Groves CMA on 11/02/2010   Method used:   Electronically to        [id number] (retail)       [address]       [place], [place]  [address]       Ph: [phone]       Fax: [phone]   RxID:   [id number]

## 2010-11-08 NOTE — Progress Notes (Signed)
Summary: Shingles Vacc.  Phone Note Call from Patient   Summary of Call: Patient called about shingles vacc. She is aware it will cost her $92+ at [facility]. I advised the patient to speak with her insurance company and see if it is convered in MD office for free to her or at least cheaper to her. She expessed understanding and will call her insurance company. Initial call taken by: Lucious Groves CMA,  November 02, 2010 11:38 AM

## 2010-11-17 ENCOUNTER — Encounter: Payer: Self-pay | Admitting: Internal Medicine

## 2010-11-17 NOTE — Assessment & Plan Note (Signed)
Summary: Follw-up on Anemia/CM   Vital Signs:  Patient profile:   77 year old female Height:      62.75 inches Weight:      108.2 pounds BMI:     19.39 Temp:     98.2 degrees F oral Pulse rate:   64 / minute Resp:     15 per minute BP sitting:   118 / 74  (left arm) Cuff size:   regular  Vitals Entered By: Shonna Chock CMA (November 07, 2010 11:18 AM) CC: Follow-up visit: address anemia, Heartburn   CC:  Follow-up visit: address anemia and Heartburn.  History of Present Illness:    Anemia has progressed ; HCT has decreased from 36.2 to 32 despite iron every other day. FOB were normal.She is still fatigued but  reports weight gain She denies acid reflux, sour taste in mouth, epigastric pain, chest pain, and trouble swallowing.  The patient denies the following alarm features: melena, dysphagia, hematemesis, and vomiting.  Colonoscopy  F/U survelliance  date will be confirmed with Dr Ewing Schlein.  Current Medications (verified): 1)  Simvastatin 40 Mg Tabs (Simvastatin) .... 1/2  By Mouth Once Daily 2)  Synthroid 88 Mcg Tabs (Levothyroxine Sodium) .Marland Kitchen.. 1 Tab By Mouth Once Daily Except 1/2 Tab Q Wed 3)  Caltrate 600  Tabs (Calcium Carbonate Tabs) .... Two Times A Day 4)  Centrum Silver  Tabs (Multiple Vitamins-Minerals) 5)  Preservision/lutein  Caps (Multiple Vitamins-Minerals) .... Daily 6)  Amoxicillin 875 Mg Tabs (Amoxicillin) .Marland Kitchen.. 1 By Mouth Two Times A Day 7)  Vitamin D3 1000 Unit Tabs (Cholecalciferol) .Marland Kitchen.. 1 By Mouth Once Daily 8)  Ciprodex 0.3-0.1 % Susp (Ciprofloxacin-Dexamethasone) .... 4 Drops Two Times A Day in Both Ears 9)  Lorazepam 0.5 Mg Tabs (Lorazepam) .Marland Kitchen.. 1 Every 8 Hrs As Needed For Anxiety 10)  Citalopram Hydrobromide 10 Mg Tabs (Citalopram Hydrobromide) .Marland Kitchen.. 1 Once Daily 11)  Slow Fe 142 (45 Fe) Mg Cr-Tabs (Ferrous Sulfate) .Marland Kitchen.. 1 By Mouth Every Other Day  Allergies (verified): No Known Drug Allergies  Past History:  Past Surgical History: G4P4 TAH w/ bladder  repair T&A Colon--polyps-03/07/2005; polyps 2009 (due 2011),Dr  [facility] Pilonidal cyst sx. x 3 Cataract extraction OS ,2009  Review of Systems ENT:  Denies nosebleeds. Resp:  Denies coughing up blood. GU:  Denies hematuria. Psych:  Denies anxiety, depression, easily angered, easily tearful, and irritability; Citalopram  has been valuable. Heme:  Denies abnormal bruising and bleeding.  Physical Exam  General:  Thin but well-nourished,in no acute distress; alert,appropriate and cooperative throughout examination Eyes:  No corneal or conjunctival inflammation noted. No icterus Mouth:  Oral mucosa and oropharynx without lesions or exudates.  Teeth in good repair.No  pharyngeal erythema.   Lungs:  Normal respiratory effort, chest expands symmetrically. Lungs are clear to auscultation, no crackles or wheezes. Heart:  Normal rate and regular rhythm. S1 and S2 normal without gallop, murmur, click, rub .S4  Abdomen:  Bowel sounds positive,abdomen soft and non-tender without masses, organomegaly or hernias noted. Aortic bruit w/o AAA Skin:  Intact without suspicious lesions or rashes No jaundice Cervical Nodes:  No lymphadenopathy noted Axillary Nodes:  No palpable lymphadenopathy Psych:  memory intact for recent and remote, normally interactive, good eye contact, not anxious appearing, and not depressed appearing.     Impression & Recommendations:  Problem # 1:  ANEMIA (ICD-285.9)  Her updated medication list for this problem includes:    Slow Fe 142 (45 Fe) Mg Cr-tabs (Ferrous sulfate) .Marland KitchenMarland KitchenMarland KitchenMarland Kitchen  1 by mouth every other day  Orders: Venipuncture (04540) TLB-B12 + Folate Pnl (98119_14782-N56/OZH) TLB-IBC Pnl (Iron/FE;Transferrin) (83550-IBC)  Problem # 2:  COLONIC POLYPS, HX OF (ICD-V12.72) survelliance as per Dr Ewing Schlein  Complete Medication List: 1)  Simvastatin 40 Mg Tabs (Simvastatin) .... 1/2  by mouth once daily 2)  Synthroid 88 Mcg Tabs (Levothyroxine sodium) .Marland Kitchen.. 1 tab by mouth  once daily except 1/2 tab q wed 3)  Caltrate 600 Tabs (Calcium carbonate tabs) .... Two times a day 4)  Centrum Silver Tabs (Multiple vitamins-minerals) 5)  Preservision/lutein Caps (Multiple vitamins-minerals) .... Daily 6)  Amoxicillin 875 Mg Tabs (Amoxicillin) .Marland Kitchen.. 1 by mouth two times a day 7)  Vitamin D3 1000 Unit Tabs (Cholecalciferol) .Marland Kitchen.. 1 by mouth once daily 8)  Ciprodex 0.3-0.1 % Susp (Ciprofloxacin-dexamethasone) .... 4 drops two times a day in both ears 9)  Lorazepam 0.5 Mg Tabs (Lorazepam) .Marland Kitchen.. 1 every 8 hrs as needed for anxiety 10)  Citalopram Hydrobromide 10 Mg Tabs (Citalopram hydrobromide) .Marland Kitchen.. 1 once daily 11)  Slow Fe 142 (45 Fe) Mg Cr-tabs (Ferrous sulfate) .Marland Kitchen.. 1 by mouth every other day  Patient Instructions: 1)  Call Dr Marlane Hatcher office concerning  any need for Colonoscopy @ this time.   Orders Added: 1)  Est. Patient Level III [08657] 2)  Venipuncture [36415] 3)  TLB-B12 + Folate Pnl [82746_82607-B12/FOL] 4)  TLB-IBC Pnl (Iron/FE;Transferrin) [83550-IBC]

## 2010-11-21 LAB — COMPREHENSIVE METABOLIC PANEL
ALT: 15 U/L (ref 0–35)
CO2: 26 mEq/L (ref 19–32)
Calcium: 9.6 mg/dL (ref 8.4–10.5)
Creatinine, Ser: 0.6 mg/dL (ref 0.4–1.2)
GFR calc non Af Amer: >60 mL/min (ref >60)
Glucose, Bld: 136 mg/dL — ABNORMAL HIGH (ref 70–99)

## 2010-11-21 LAB — DIFFERENTIAL
Basophils Absolute: 0 10*3/uL (ref 0.0–0.1)
Eosinophils Absolute: 0.1 10*3/uL (ref 0.0–0.7)
Lymphocytes Relative: 7 % — ABNORMAL LOW (ref 12–46)
Lymphs Abs: 1.1 10*3/uL (ref 0.7–4.0)
Neutrophils Relative %: 87 % — ABNORMAL HIGH (ref 43–77)

## 2010-11-21 LAB — URINE MICROSCOPIC-ADD ON

## 2010-11-21 LAB — CBC
HCT: 36.1 % (ref 36.0–46.0)
Hemoglobin: 12.3 g/dL (ref 12.0–15.0)
MCH: 30.7 pg (ref 26.0–34.0)
MCHC: 34.1 g/dL (ref 30.0–36.0)

## 2010-11-21 LAB — POCT CARDIAC MARKERS
CKMB, poc: 2 ng/mL (ref 1.0–8.0)
Myoglobin, poc: 76.7 ng/mL (ref 12–200)
Troponin i, poc: <0.05 ng/mL (ref 0.00–0.09)

## 2010-11-21 LAB — URINALYSIS, ROUTINE W REFLEX MICROSCOPIC
Bilirubin Urine: NEGATIVE
Glucose, UA: NEGATIVE mg/dL
Ketones, ur: 15 mg/dL — AB
Leukocytes, UA: NEGATIVE
Protein, ur: 30 mg/dL — AB

## 2010-11-28 ENCOUNTER — Encounter: Payer: Self-pay | Admitting: Internal Medicine

## 2010-11-28 ENCOUNTER — Telehealth: Payer: Self-pay

## 2010-11-29 ENCOUNTER — Ambulatory Visit (INDEPENDENT_AMBULATORY_CARE_PROVIDER_SITE_OTHER): Payer: Medicare Other | Admitting: Internal Medicine

## 2010-11-29 ENCOUNTER — Encounter: Payer: Self-pay | Admitting: Internal Medicine

## 2010-11-29 DIAGNOSIS — L509 Urticaria, unspecified: Secondary | ICD-10-CM

## 2010-11-29 MED ORDER — PREDNISONE 20 MG PO TABS: 20.0000 mg | ORAL_TABLET | Freq: Every day | ORAL | Status: AC

## 2010-11-29 NOTE — Letter (Signed)
Summary: [facility]   Imported By: Kassie Mends 11/24/2010 08:34:29  _____________________________________________________________________  External Attachment:    Type:   Image     Comment:   External Document

## 2010-11-29 NOTE — Patient Instructions (Signed)
Review the information on urticaria or hives in  Web MD. Please do not use your normal deodorant. Use cornstarch until this rash resolves completely.

## 2010-11-29 NOTE — Progress Notes (Signed)
  Subjective:    Patient ID: Jessica Sherman, female    DOB: 1931/07/23, 77 y.o.   MRN: [id number]  HPI Comments:  She describes a rash which appeared approximately 3 weeks ago. It started in the axillary areas as well as inguinal areas. She has used hydrocortisone cream topically with a good response only in the inguinal  areas. The rash has persisted in the armpits. She describes it is minimally itchy. She denies fever chills or sweats. Weight loss has been a slight chronic problem. She's lost approximately 3 pounds despite increased appetite.   She denies itchy watery eyes or sneezing or wheezing. She denies abdominal pain nausea vomiting or diarrhea. She's had no signs of sinusitis or conjunctivitis.    She denies using any new  Deodorant.    On exam she is in no acute distress. Eyes are clear with no signs clinically of conjunctivitis. Nares are clear without exudate. Oropharynx is unremarkable. She has no lymphadenopathy about the head neck or axilla. Chest is clear without wheezing or rhonchi. Abdomen is nontender. No organomegaly is present. She exhibits scattered plaque-like lesions which are slightly pink and raised in both axillary areas. These actually blanch with pressure. They are nontender.Dermatographia can be exhibited when a clear area of the skin is scratched with a Qtip .    Rash      Review of Systems  Skin: Positive for rash.       Objective:   Physical Exam        Assessment & Plan:   #1 she has a nonpruritic rash which appears to be an urticarial variant. Clinically there is no evidence of any underlying contributing process.   #2 weight loss ; this is essentially stable.  Plan : #1 a burst of oral steroids  will be recommended. I will ask her daughter-in-law Tammy who is a nurse to look up urticaria or hives in Web M.D.   If the lesions fail to resolve a biopsy may be necessary to rule out a   vasculitic process.

## 2010-12-08 NOTE — Progress Notes (Signed)
Summary: Rash  Phone Note Call from Patient   Caller: Patient Summary of Call: patient left msg on voicemail that she has rash under arm and in groin area   Follow-up for Phone Call        returned patient call left message on machine .Marland KitchenMarland KitchenMarland KitchenDoristine Devoid CMA  November 28, 2010 2:27 PM   Additional Follow-up for Phone Call Additional follow up Details #1::        Spoke with patient and she thinks the rash is related to slow fe iron but she feels much better since taking. Rash x 2 weeks since starting full tablet. Scheduled appointment for tomorrow to futher address Additional Follow-up by: Shonna Chock CMA,  November 28, 2010 3:18 PM

## 2011-03-07 ENCOUNTER — Other Ambulatory Visit: Payer: Self-pay | Admitting: Internal Medicine

## 2011-03-07 NOTE — Telephone Encounter (Signed)
Refill sent again

## 2011-03-07 NOTE — Telephone Encounter (Signed)
Refill sent. Pt TSH was last checked 08/2010.

## 2011-04-03 ENCOUNTER — Encounter: Payer: Self-pay | Admitting: Internal Medicine

## 2011-04-10 ENCOUNTER — Telehealth: Payer: Self-pay | Admitting: Internal Medicine

## 2011-04-10 NOTE — Telephone Encounter (Signed)
Pt aware appt for labs scheduled.

## 2011-04-10 NOTE — Telephone Encounter (Addendum)
Pt called says she has been having muscle pain for about a month believes it is causing muscle pain and would like to know what she should do.

## 2011-04-10 NOTE — Telephone Encounter (Signed)
CK & vitamin D level ( 729.1; 733.90)

## 2011-04-11 ENCOUNTER — Other Ambulatory Visit (INDEPENDENT_AMBULATORY_CARE_PROVIDER_SITE_OTHER): Payer: Medicare Other

## 2011-04-11 DIAGNOSIS — M949 Disorder of cartilage, unspecified: Secondary | ICD-10-CM

## 2011-04-11 DIAGNOSIS — IMO0001 Reserved for inherently not codable concepts without codable children: Secondary | ICD-10-CM

## 2011-04-11 LAB — CK: Total CK: 49 U/L (ref 7–177)

## 2011-04-11 NOTE — Progress Notes (Signed)
Labs only

## 2011-06-21 ENCOUNTER — Ambulatory Visit (INDEPENDENT_AMBULATORY_CARE_PROVIDER_SITE_OTHER): Payer: Medicare Other

## 2011-06-21 DIAGNOSIS — Z23 Encounter for immunization: Secondary | ICD-10-CM

## 2011-08-16 ENCOUNTER — Other Ambulatory Visit: Payer: Self-pay | Admitting: Internal Medicine

## 2011-08-16 NOTE — Telephone Encounter (Signed)
rx sent to pharmacy by e-script Noted has not been in office since 11-2010 sent only #60 to last pt till upcoming appt on 10-04-11

## 2011-09-17 ENCOUNTER — Other Ambulatory Visit: Payer: Self-pay | Admitting: Internal Medicine

## 2011-10-04 ENCOUNTER — Encounter: Payer: Self-pay | Admitting: Internal Medicine

## 2011-10-04 ENCOUNTER — Ambulatory Visit (INDEPENDENT_AMBULATORY_CARE_PROVIDER_SITE_OTHER): Payer: Medicare Other | Admitting: Internal Medicine

## 2011-10-04 DIAGNOSIS — R7309 Other abnormal glucose: Secondary | ICD-10-CM

## 2011-10-04 DIAGNOSIS — D649 Anemia, unspecified: Secondary | ICD-10-CM

## 2011-10-04 DIAGNOSIS — R9431 Abnormal electrocardiogram [ECG] [EKG]: Secondary | ICD-10-CM

## 2011-10-04 DIAGNOSIS — E559 Vitamin D deficiency, unspecified: Secondary | ICD-10-CM

## 2011-10-04 DIAGNOSIS — E782 Mixed hyperlipidemia: Secondary | ICD-10-CM

## 2011-10-04 DIAGNOSIS — Z0000 Encounter for general adult medical examination without abnormal findings: Secondary | ICD-10-CM

## 2011-10-04 DIAGNOSIS — M899 Disorder of bone, unspecified: Secondary | ICD-10-CM

## 2011-10-04 DIAGNOSIS — E039 Hypothyroidism, unspecified: Secondary | ICD-10-CM

## 2011-10-04 NOTE — Progress Notes (Signed)
Subjective:    Patient ID: Jessica Sherman, female    DOB: 03-08-1931, 78 y.o.   MRN: [id number]  HPI Medicare Wellness Visit:  The following psychosocial & medical history were reviewed as required by Medicare.   Social history: caffeine:5-6 cups tea/ day , alcohol:  1 wine /day ,  tobacco use : never  & exercise : walking 5X/ week 30-60 min.   Home & personal  safety / fall risk: no issues, activities of daily living: no limitations , seatbelt use : yes , and smoke alarm employment : yes .  Power of Attorney/Living Will status : in place  Vision ( as recorded per Nurse) & Hearing  evaluation :  Ophth sen annually(dry MD); hearing loss evaluated by Dr Jenne Pane , ENT. Orientation :oriented X 3 , memory & recall :good, spelling  testing: good,and mood & affect :normal  . Depression / anxiety: denied Travel history : last 2005 , [place] , immunization status :up to date, transfusion history: post TAH, and preventive health surveillance ( colonoscopies, BMD , etc as per protocol/ [facility]): colonoscopy up to date, Dental care: seen every 6 mos. Chart reviewed &  Updated. Active issues reviewed & addressed.       Review of Systems  Patient reports no significant   adenopathy,fever, weight change,  persistant / recurrent hoarseness , swallowing issues, chest pain,palpitations,edema,persistant /recurrent cough, hemoptysis, dyspnea( rest/ exertional/paroxysmal nocturnal), gastrointestinal bleeding(melena, rectal bleeding), abdominal pain, significant heartburn,  bowel changes,GU symptoms(dysuria, hematuria,pyuria, incontinence), Gyn symptoms(abnormal  bleeding , pain),  syncope, focal weakness, memory loss,numbness & tingling, skin/hair /nail changes,abnormal bruising or bleeding.    Objective:   Physical Exam Gen.: Thin but healthy but well-nourished in appearance. Alert, appropriate and cooperative throughout exam. Head: Normocephalic without obvious abnormalities Eyes: No corneal or conjunctival  inflammation noted.  Extraocular motion intact. Ears: External  ear exam reveals no significant lesions or deformities. Canals clear .TMs normal.  Nose: External nasal exam reveals no deformity or inflammation. Nasal mucosa are pink and moist. No lesions or exudates noted.  Mouth: Oral mucosa and oropharynx reveal no lesions or exudates. Teeth in good repair; upper & lower partials. Neck: No deformities, masses, or tenderness noted. Range of motion slightly reduced; Thyroid small. Lungs: Normal respiratory effort; chest expands symmetrically. Lungs are clear to auscultation without rales, wheezes, or increased work of breathing. Heart: Normal rate and rhythm. Normal S1 and S2. No gallop, click, or rub. S4 with slurring ; no murmur. Abdomen: Bowel sounds normal; abdomen soft and nontender. No masses, organomegaly or hernias noted. Genitalia: Dr Ambrose Mantle   .                                                                                   Musculoskeletal/extremities:Minimal lordosis noted of  the thoracic or lumbar spine. No clubbing,cyanosis, or edema.Mixed hand joint deformities noted. Range of motion  normal .Tone & strength  normal.Joints normal. Nail health  good. Vascular: Carotid, radial artery, dorsalis pedis and  posterior tibial pulses are full and equal. No bruits present. Neurologic: Alert and oriented x3. Deep tendon reflexes symmetrical and normal.          Skin: Intact without  suspicious lesions or rashes. Lymph: No cervical, axillary lymphadenopathy present. Psych: Mood and affect are normal. Normally interactive                                                                                         Assessment & Plan:  #1 Medicare Wellness Exam; criteria met ; data entered #2 Problem List reviewed ; Assessment/ Recommendations made Plan: see Orders    EKG reveals a T wave in lead V2 ; pattern suggestive of incomplete right bundle branch block. No ischemic changes are present.  EKG changes are stable compared to prior EKGs.

## 2011-10-04 NOTE — Patient Instructions (Signed)
Please  schedule fasting Labs : BMET,Lipids, hepatic panel, CBC & dif, TSH, vit d level, A1c. PLEASE BRING THESE INSTRUCTIONS TO FOLLOW UP  LAB APPOINTMENT.This will guarantee correct labs are drawn, eliminating need for repeat blood sampling ( needle sticks ! ). Diagnoses /Codes: 272.4, 995.20,211.3, [id number]

## 2011-10-09 ENCOUNTER — Other Ambulatory Visit: Payer: Self-pay | Admitting: Internal Medicine

## 2011-10-09 DIAGNOSIS — E559 Vitamin D deficiency, unspecified: Secondary | ICD-10-CM

## 2011-10-09 DIAGNOSIS — E785 Hyperlipidemia, unspecified: Secondary | ICD-10-CM

## 2011-10-09 DIAGNOSIS — T887XXA Unspecified adverse effect of drug or medicament, initial encounter: Secondary | ICD-10-CM

## 2011-10-09 DIAGNOSIS — D126 Benign neoplasm of colon, unspecified: Secondary | ICD-10-CM

## 2011-10-09 DIAGNOSIS — R7309 Other abnormal glucose: Secondary | ICD-10-CM

## 2011-10-10 ENCOUNTER — Other Ambulatory Visit (INDEPENDENT_AMBULATORY_CARE_PROVIDER_SITE_OTHER): Payer: Medicare Other

## 2011-10-10 DIAGNOSIS — T887XXA Unspecified adverse effect of drug or medicament, initial encounter: Secondary | ICD-10-CM

## 2011-10-10 DIAGNOSIS — R7309 Other abnormal glucose: Secondary | ICD-10-CM

## 2011-10-10 DIAGNOSIS — E785 Hyperlipidemia, unspecified: Secondary | ICD-10-CM

## 2011-10-10 DIAGNOSIS — E559 Vitamin D deficiency, unspecified: Secondary | ICD-10-CM

## 2011-10-10 DIAGNOSIS — D126 Benign neoplasm of colon, unspecified: Secondary | ICD-10-CM

## 2011-10-10 LAB — BASIC METABOLIC PANEL
BUN: 17 mg/dL (ref 6–23)
Chloride: 100 mEq/L (ref 96–112)
Glucose, Bld: 92 mg/dL (ref 70–99)
Potassium: 4.1 mEq/L (ref 3.5–5.1)
Sodium: 137 mEq/L (ref 135–145)

## 2011-10-10 LAB — HEPATIC FUNCTION PANEL
ALT: 16 U/L (ref 0–35)
Albumin: 4 g/dL (ref 3.5–5.2)
Bilirubin, Direct: 0 mg/dL (ref 0.0–0.3)
Total Protein: 7.5 g/dL (ref 6.0–8.3)

## 2011-10-10 LAB — CBC WITH DIFFERENTIAL/PLATELET
Basophils Relative: 0.5 % (ref 0.0–3.0)
Eosinophils Relative: 3.7 % (ref 0.0–5.0)
Hemoglobin: 12.4 g/dL (ref 12.0–15.0)
Lymphocytes Relative: 33.7 % (ref 12.0–46.0)
MCHC: 34.1 g/dL (ref 30.0–36.0)
Monocytes Relative: 7.9 % (ref 3.0–12.0)
Neutro Abs: 4.7 10*3/uL (ref 1.4–7.7)
Neutrophils Relative %: 54.2 % (ref 43.0–77.0)
RBC: 3.73 Mil/uL — ABNORMAL LOW (ref 3.87–5.11)
WBC: 8.6 10*3/uL (ref 4.5–10.5)

## 2011-10-10 LAB — LIPID PANEL
Cholesterol: 160 mg/dL (ref 0–200)
HDL: 64.6 mg/dL (ref >39.00)
LDL Cholesterol: 70 mg/dL (ref 0–99)
VLDL: 25.6 mg/dL (ref 0.0–40.0)

## 2011-10-10 LAB — HEMOGLOBIN A1C: Hgb A1c MFr Bld: 5.7 % (ref 4.6–6.5)

## 2011-10-11 LAB — VITAMIN D 25 HYDROXY (VIT D DEFICIENCY, FRACTURES): Vit D, 25-Hydroxy: 73 ng/mL (ref 30–89)

## 2011-10-20 ENCOUNTER — Other Ambulatory Visit: Payer: Self-pay | Admitting: Internal Medicine

## 2011-12-28 ENCOUNTER — Other Ambulatory Visit: Payer: Self-pay | Admitting: Internal Medicine

## 2012-04-02 ENCOUNTER — Encounter: Payer: Self-pay | Admitting: Internal Medicine

## 2012-11-13 ENCOUNTER — Other Ambulatory Visit: Payer: Self-pay | Admitting: Internal Medicine

## 2012-11-13 NOTE — Telephone Encounter (Signed)
TSH 244.9 

## 2012-12-15 ENCOUNTER — Other Ambulatory Visit: Payer: Self-pay | Admitting: Internal Medicine

## 2012-12-16 ENCOUNTER — Telehealth: Payer: Self-pay | Admitting: *Deleted

## 2012-12-16 MED ORDER — LEVOTHYROXINE SODIUM 88 MCG PO TABS: ORAL_TABLET | ORAL | Status: DC

## 2012-12-16 NOTE — Telephone Encounter (Signed)
#  30, schedule OV in next 30 days

## 2012-12-16 NOTE — Telephone Encounter (Signed)
Left message on VM informing patient she needs to schedule a medication management appointment with Dr.Hopper. RX sent electronically for #30

## 2012-12-16 NOTE — Telephone Encounter (Signed)
Last TSH 10-09-12, last filled 11-14-11, last OV 10-04-11, .Please advise ok to fill med

## 2012-12-17 ENCOUNTER — Telehealth: Payer: Self-pay | Admitting: Internal Medicine

## 2012-12-17 ENCOUNTER — Ambulatory Visit (INDEPENDENT_AMBULATORY_CARE_PROVIDER_SITE_OTHER): Payer: Medicare Other | Admitting: Internal Medicine

## 2012-12-17 ENCOUNTER — Encounter: Payer: Self-pay | Admitting: Internal Medicine

## 2012-12-17 VITALS — BP 118/72 | HR 62 | Wt 116.6 lb

## 2012-12-17 DIAGNOSIS — M899 Disorder of bone, unspecified: Secondary | ICD-10-CM

## 2012-12-17 DIAGNOSIS — M949 Disorder of cartilage, unspecified: Secondary | ICD-10-CM

## 2012-12-17 DIAGNOSIS — E782 Mixed hyperlipidemia: Secondary | ICD-10-CM

## 2012-12-17 DIAGNOSIS — E039 Hypothyroidism, unspecified: Secondary | ICD-10-CM

## 2012-12-17 NOTE — Progress Notes (Signed)
  Subjective:    Patient ID: Jessica Sherman, female    DOB: [birth year], 79 y.o.   MRN: [id number]  HPI Patient is here to monitor thyroid & lipid  status There has been no change in the dose, brand,or mode of administration of thyroid supplement Last TSH  1/13.  Dyslipidemia assessment: No Prior Advanced Lipid Testing     Family history of premature CAD/ MI not present  .  No specific nutrition plan described . Regular Exercise as walking 3 mpd  5 days / week . No PMH of Diabetes . No PMH of HTN  No Smoking history .         Review of Systems Constitutional: No significant change in weight; significant fatigue; sleep disorder; change in appetite. Eye: no blurred, double ,loss of vision Cardiovascular: no palpitations; racing; irregularity ENT/GI: no constipation; diarrhea;hoarseness;dysphagia Derm: no change in nails,hair,skin Neuro: no numbness or tingling; tremor Psych:no anxiety; depression; panic attacks Endo: no temperature intolerance to heat ,cold No chest pain  ;claudication;  dyspnea; PND No myalgias but intermittent nocturnal leg cramps No significant  lightheadedness;  syncope ; memory loss      Objective:   Physical Exam  Gen.:  Thin but well-nourished; in no acute distress.Appears younger than stated age Eyes: Extraocular motion intact; no lid lag or proptosis ,nystagmus Neck: no masses ; thyroid small  Heart: Normal rhythm and rate without significant murmur, gallop, or extra heart sounds Lungs: Chest clear to auscultation without rales,rales, wheezes Neuro:Deep tendon reflexes are equal and within normal limits; no tremor   Abdomen: No organomegaly or masses. The aorta is palpable. There is no aortic aneurysm. There no periumbilical bruits Skin: Warm and dry without significant lesions or rashes; no onycholysis Lymphatic: no cervical or axillary LA  Vascular: All pulses intact; no bruits present Psych: Normally communicative and interactive; no  abnormal mood or affect clinically.        Assessment & Plan:

## 2012-12-17 NOTE — Telephone Encounter (Signed)
Noted patient to be seen today at 11:30, # 30 was sent to pharmacy yesterday

## 2012-12-17 NOTE — Telephone Encounter (Signed)
[facility] Triage Call Report Triage Record Num: [id number] Operator: Malachi Paradise Patient Name: Jessica Sherman Call Date & Time: 12/16/2012 6:14:02PM Patient Phone: [phone] PCP: Patient Gender: Female PCP Fax : Patient DOB: Jan 19, 1931 Practice Name: [facility] Reason for Call: Caller: Jessica Sherman; PCP: Marga Melnick; CB#: [phone]; Call regarding Rx for synthroid; Patient states she needs and appt to be able to get her synthroid refiiled. Pt reports she is out of medication. Appt scheduled for 11:30 with Dr Alwyn Ren. Protocol(s) Used: Office Note Recommended Outcome per Protocol: Information Noted and Sent to Office Reason for Outcome: Caller information to office Care Advice: ~

## 2012-12-17 NOTE — Assessment & Plan Note (Signed)
Due this year

## 2012-12-17 NOTE — Assessment & Plan Note (Signed)
Advanced cholesterol testing indicated to verify need for the statin @ her age

## 2012-12-17 NOTE — Patient Instructions (Addendum)
Please  schedule fasting Labs : BMET,NMR Lipoprofile Lipid Panel, hepatic panel, CK,  TSH.PLEASE BRING THESE INSTRUCTIONS TO FOLLOW UP  LAB APPOINTMENT.This will guarantee correct labs are drawn, eliminating need for repeat blood sampling ( needle sticks ! ). Diagnoses /Codes: 272.4,995.20,244.9. Review and correct the record as indicated. Please share record with all medical staff seen.

## 2012-12-17 NOTE — Assessment & Plan Note (Signed)
TSH will be performed to verify appropriate dose of thyroid

## 2012-12-19 ENCOUNTER — Other Ambulatory Visit (INDEPENDENT_AMBULATORY_CARE_PROVIDER_SITE_OTHER): Payer: Medicare Other

## 2012-12-19 DIAGNOSIS — E785 Hyperlipidemia, unspecified: Secondary | ICD-10-CM

## 2012-12-19 DIAGNOSIS — T887XXA Unspecified adverse effect of drug or medicament, initial encounter: Secondary | ICD-10-CM

## 2012-12-19 DIAGNOSIS — E039 Hypothyroidism, unspecified: Secondary | ICD-10-CM

## 2012-12-19 LAB — TSH: TSH: 3.69 u[IU]/mL (ref 0.35–5.50)

## 2012-12-19 LAB — HEPATIC FUNCTION PANEL
Bilirubin, Direct: 0.1 mg/dL (ref 0.0–0.3)
Total Bilirubin: 0.5 mg/dL (ref 0.3–1.2)
Total Protein: 7.4 g/dL (ref 6.0–8.3)

## 2012-12-19 LAB — BASIC METABOLIC PANEL
GFR: 77.78 mL/min (ref >60.00)
Potassium: 4.1 mEq/L (ref 3.5–5.1)
Sodium: 136 mEq/L (ref 135–145)

## 2012-12-19 LAB — CK: Total CK: 63 U/L (ref 7–177)

## 2012-12-20 LAB — NMR LIPOPROFILE WITH LIPIDS
HDL Size: 9.3 nm (ref >=9.2)
HDL-C: 55 mg/dL (ref >=40)
LDL Particle Number: 878 nmol/L (ref <1000)
LDL Size: 19.9 nm — ABNORMAL LOW (ref >20.5)
Large VLDL-P: 4.3 nmol/L — ABNORMAL HIGH (ref <=2.7)
Triglycerides: 157 mg/dL — ABNORMAL HIGH (ref <150)
VLDL Size: 47.7 nm — ABNORMAL HIGH (ref <=46.6)

## 2012-12-24 ENCOUNTER — Encounter: Payer: Self-pay | Admitting: *Deleted

## 2013-01-06 ENCOUNTER — Other Ambulatory Visit: Payer: Self-pay | Admitting: Internal Medicine

## 2013-02-17 ENCOUNTER — Other Ambulatory Visit: Payer: Self-pay | Admitting: Internal Medicine

## 2013-04-01 ENCOUNTER — Encounter: Payer: Self-pay | Admitting: Internal Medicine

## 2013-04-04 ENCOUNTER — Encounter: Payer: Self-pay | Admitting: Internal Medicine

## 2013-04-18 ENCOUNTER — Encounter: Payer: Self-pay | Admitting: Internal Medicine

## 2013-05-30 ENCOUNTER — Emergency Department (HOSPITAL_COMMUNITY): Payer: Medicare Other

## 2013-05-30 ENCOUNTER — Inpatient Hospital Stay (HOSPITAL_COMMUNITY)
Admission: EM | Admit: 2013-05-30 | Discharge: 2013-06-02 | DRG: 392 | Disposition: A | Discharge status: Home / Self Care | Payer: Medicare Other | Attending: Internal Medicine | Admitting: Internal Medicine

## 2013-05-30 ENCOUNTER — Encounter (HOSPITAL_COMMUNITY): Payer: Self-pay | Admitting: Emergency Medicine

## 2013-05-30 DIAGNOSIS — D72829 Elevated white blood cell count, unspecified: Secondary | ICD-10-CM | POA: Diagnosis present

## 2013-05-30 DIAGNOSIS — Z860100 Personal history of colon polyps, unspecified: Secondary | ICD-10-CM

## 2013-05-30 DIAGNOSIS — Z79899 Other long term (current) drug therapy: Secondary | ICD-10-CM

## 2013-05-30 DIAGNOSIS — E871 Hypo-osmolality and hyponatremia: Secondary | ICD-10-CM | POA: Diagnosis not present

## 2013-05-30 DIAGNOSIS — E559 Vitamin D deficiency, unspecified: Secondary | ICD-10-CM | POA: Diagnosis present

## 2013-05-30 DIAGNOSIS — K5289 Other specified noninfective gastroenteritis and colitis: Principal | ICD-10-CM | POA: Diagnosis present

## 2013-05-30 DIAGNOSIS — R7309 Other abnormal glucose: Secondary | ICD-10-CM | POA: Diagnosis present

## 2013-05-30 DIAGNOSIS — R1084 Generalized abdominal pain: Secondary | ICD-10-CM | POA: Diagnosis present

## 2013-05-30 DIAGNOSIS — R7303 Prediabetes: Secondary | ICD-10-CM

## 2013-05-30 DIAGNOSIS — R7989 Other specified abnormal findings of blood chemistry: Secondary | ICD-10-CM

## 2013-05-30 DIAGNOSIS — D649 Anemia, unspecified: Secondary | ICD-10-CM

## 2013-05-30 DIAGNOSIS — E876 Hypokalemia: Secondary | ICD-10-CM | POA: Diagnosis not present

## 2013-05-30 DIAGNOSIS — M8580 Other specified disorders of bone density and structure, unspecified site: Secondary | ICD-10-CM | POA: Insufficient documentation

## 2013-05-30 DIAGNOSIS — K529 Noninfective gastroenteritis and colitis, unspecified: Secondary | ICD-10-CM | POA: Diagnosis present

## 2013-05-30 DIAGNOSIS — E860 Dehydration: Secondary | ICD-10-CM

## 2013-05-30 DIAGNOSIS — R42 Dizziness and giddiness: Secondary | ICD-10-CM

## 2013-05-30 DIAGNOSIS — T887XXA Unspecified adverse effect of drug or medicament, initial encounter: Secondary | ICD-10-CM

## 2013-05-30 DIAGNOSIS — E782 Mixed hyperlipidemia: Secondary | ICD-10-CM

## 2013-05-30 DIAGNOSIS — H353190 Nonexudative age-related macular degeneration, unspecified eye, stage unspecified: Secondary | ICD-10-CM | POA: Insufficient documentation

## 2013-05-30 DIAGNOSIS — R52 Pain, unspecified: Secondary | ICD-10-CM

## 2013-05-30 DIAGNOSIS — E785 Hyperlipidemia, unspecified: Secondary | ICD-10-CM | POA: Diagnosis present

## 2013-05-30 DIAGNOSIS — M899 Disorder of bone, unspecified: Secondary | ICD-10-CM | POA: Diagnosis present

## 2013-05-30 DIAGNOSIS — E039 Hypothyroidism, unspecified: Secondary | ICD-10-CM | POA: Diagnosis present

## 2013-05-30 DIAGNOSIS — E872 Acidosis: Secondary | ICD-10-CM

## 2013-05-30 HISTORY — DX: Prediabetes: R73.03

## 2013-05-30 HISTORY — DX: Dizziness and giddiness: R42

## 2013-05-30 LAB — CBC WITH DIFFERENTIAL/PLATELET
Basophils Absolute: 0 10*3/uL (ref 0.0–0.1)
Basophils Relative: 0 % (ref 0–1)
Eosinophils Absolute: 0 10*3/uL (ref 0.0–0.7)
Eosinophils Relative: 0 % (ref 0–5)
HCT: 40.8 % (ref 36.0–46.0)
Hemoglobin: 13.7 g/dL (ref 12.0–15.0)
Lymphocytes Relative: 7 % — ABNORMAL LOW (ref 12–46)
Lymphs Abs: 1.3 10*3/uL (ref 0.7–4.0)
MCH: 32.1 pg (ref 26.0–34.0)
MCHC: 33.6 g/dL (ref 30.0–36.0)
MCV: 95.6 fL (ref 78.0–100.0)
Monocytes Absolute: 0.5 10*3/uL (ref 0.1–1.0)
Monocytes Relative: 3 % (ref 3–12)
Neutro Abs: 17.1 10*3/uL — ABNORMAL HIGH (ref 1.7–7.7)
Neutrophils Relative %: 90 % — ABNORMAL HIGH (ref 43–77)
Platelets: 179 10*3/uL (ref 150–400)
RBC: 4.27 MIL/uL (ref 3.87–5.11)
RDW: 12.5 % (ref 11.5–15.5)
WBC: 18.9 10*3/uL — ABNORMAL HIGH (ref 4.0–10.5)

## 2013-05-30 LAB — CBC
HCT: 32.3 % — ABNORMAL LOW (ref 36.0–46.0)
MCH: 32.2 pg (ref 26.0–34.0)
MCHC: 34.4 g/dL (ref 30.0–36.0)
MCV: 93.6 fL (ref 78.0–100.0)
Platelets: 192 10*3/uL (ref 150–400)
RDW: 12.6 % (ref 11.5–15.5)
WBC: 20.6 10*3/uL — ABNORMAL HIGH (ref 4.0–10.5)

## 2013-05-30 LAB — COMPREHENSIVE METABOLIC PANEL
Alkaline Phosphatase: 65 U/L (ref 39–117)
BUN: 19 mg/dL (ref 6–23)
Creatinine, Ser: 0.77 mg/dL (ref 0.50–1.10)
GFR calc Af Amer: 90 mL/min — ABNORMAL LOW (ref >90)
Glucose, Bld: 145 mg/dL — ABNORMAL HIGH (ref 70–99)
Potassium: 3.9 mEq/L (ref 3.5–5.1)
Total Protein: 7.4 g/dL (ref 6.0–8.3)

## 2013-05-30 LAB — LACTIC ACID, PLASMA: Lactic Acid, Venous: 3.5 mmol/L — ABNORMAL HIGH (ref 0.5–2.2)

## 2013-05-30 LAB — URINALYSIS, ROUTINE W REFLEX MICROSCOPIC
Bilirubin Urine: NEGATIVE
Ketones, ur: NEGATIVE mg/dL
Leukocytes, UA: NEGATIVE
Nitrite: NEGATIVE
Specific Gravity, Urine: 1.015 (ref 1.005–1.030)
Urobilinogen, UA: 0.2 mg/dL (ref 0.0–1.0)

## 2013-05-30 LAB — CREATININE, SERUM: GFR calc Af Amer: >90 mL/min (ref >90)

## 2013-05-30 LAB — LIPASE, BLOOD: Lipase: 41 U/L (ref 11–59)

## 2013-05-30 LAB — GLUCOSE, CAPILLARY
Glucose-Capillary: 133 mg/dL — ABNORMAL HIGH (ref 70–99)
Glucose-Capillary: 115 mg/dL — ABNORMAL HIGH (ref 70–99)

## 2013-05-30 MED ORDER — ACETAMINOPHEN 325 MG PO TABS: 650.0000 mg | ORAL_TABLET | Freq: Four times a day (QID) | ORAL | Status: DC | PRN

## 2013-05-30 MED ORDER — CIPROFLOXACIN IN D5W 200 MG/100ML IV SOLN
200.0000 mg | Freq: Once | INTRAVENOUS | Status: AC
  Administered 2013-05-30: 200 mg via INTRAVENOUS
  Filled 2013-05-30: qty 100

## 2013-05-30 MED ORDER — CIPROFLOXACIN IN D5W 400 MG/200ML IV SOLN
400.0000 mg | INTRAVENOUS | Status: DC
  Filled 2013-05-30: qty 200

## 2013-05-30 MED ORDER — TEMAZEPAM 7.5 MG PO CAPS: 7.5000 mg | ORAL_CAPSULE | Freq: Every evening | ORAL | Status: DC | PRN

## 2013-05-30 MED ORDER — ONDANSETRON HCL 4 MG/2ML IJ SOLN
4.0000 mg | Freq: Once | INTRAMUSCULAR | Status: AC
  Administered 2013-05-30: 4 mg via INTRAVENOUS
  Filled 2013-05-30: qty 2

## 2013-05-30 MED ORDER — SACCHAROMYCES BOULARDII 250 MG PO CAPS
250.0000 mg | ORAL_CAPSULE | Freq: Two times a day (BID) | ORAL | Status: DC
  Administered 2013-05-30 – 2013-06-02 (×6): 250 mg via ORAL
  Filled 2013-05-30 (×7): qty 1

## 2013-05-30 MED ORDER — MAGNESIUM HYDROXIDE 400 MG/5ML PO SUSP: 30.0000 mL | Freq: Every day | ORAL | Status: DC | PRN

## 2013-05-30 MED ORDER — INSULIN ASPART 100 UNIT/ML ~~LOC~~ SOLN: 0.0000 [IU] | Freq: Three times a day (TID) | SUBCUTANEOUS | Status: DC

## 2013-05-30 MED ORDER — METRONIDAZOLE IVPB CUSTOM
250.0000 mg | Freq: Four times a day (QID) | INTRAVENOUS | Status: DC
  Administered 2013-05-30 – 2013-06-02 (×11): 250 mg via INTRAVENOUS
  Filled 2013-05-30 (×16): qty 50

## 2013-05-30 MED ORDER — PANTOPRAZOLE SODIUM 40 MG IV SOLR
40.0000 mg | INTRAVENOUS | Status: DC
  Administered 2013-05-30 – 2013-05-31 (×2): 40 mg via INTRAVENOUS
  Filled 2013-05-30 (×3): qty 40

## 2013-05-30 MED ORDER — ENOXAPARIN SODIUM 40 MG/0.4ML ~~LOC~~ SOLN
40.0000 mg | SUBCUTANEOUS | Status: DC
  Administered 2013-05-30 – 2013-06-01 (×3): 40 mg via SUBCUTANEOUS
  Filled 2013-05-30 (×4): qty 0.4

## 2013-05-30 MED ORDER — CIPROFLOXACIN IN D5W 400 MG/200ML IV SOLN
400.0000 mg | Freq: Two times a day (BID) | INTRAVENOUS | Status: DC
  Administered 2013-05-31 – 2013-06-02 (×5): 400 mg via INTRAVENOUS
  Filled 2013-05-30 (×8): qty 200

## 2013-05-30 MED ORDER — ACETAMINOPHEN 650 MG RE SUPP: 650.0000 mg | Freq: Four times a day (QID) | RECTAL | Status: DC | PRN

## 2013-05-30 MED ORDER — ONDANSETRON HCL 4 MG PO TABS: 4.0000 mg | ORAL_TABLET | Freq: Four times a day (QID) | ORAL | Status: DC | PRN

## 2013-05-30 MED ORDER — METRONIDAZOLE IN NACL 5-0.79 MG/ML-% IV SOLN: 500.0000 mg | Freq: Once | INTRAVENOUS | Status: DC

## 2013-05-30 MED ORDER — MORPHINE SULFATE 2 MG/ML IJ SOLN
1.0000 mg | INTRAMUSCULAR | Status: DC | PRN
  Administered 2013-05-31 (×2): 1 mg via INTRAVENOUS
  Filled 2013-05-30 (×2): qty 1

## 2013-05-30 MED ORDER — GI COCKTAIL ~~LOC~~
30.0000 mL | Freq: Two times a day (BID) | ORAL | Status: DC | PRN
  Filled 2013-05-30: qty 30

## 2013-05-30 MED ORDER — SIMVASTATIN 20 MG PO TABS
20.0000 mg | ORAL_TABLET | Freq: Every evening | ORAL | Status: DC
  Administered 2013-05-30 – 2013-06-01 (×3): 20 mg via ORAL
  Filled 2013-05-30 (×4): qty 1

## 2013-05-30 MED ORDER — SIMETHICONE 80 MG PO CHEW
80.0000 mg | CHEWABLE_TABLET | Freq: Four times a day (QID) | ORAL | Status: DC | PRN
  Filled 2013-05-30: qty 1

## 2013-05-30 MED ORDER — IOHEXOL 300 MG/ML  SOLN
25.0000 mL | INTRAMUSCULAR | Status: DC
  Administered 2013-05-30: 25 mL via ORAL

## 2013-05-30 MED ORDER — SODIUM CHLORIDE 0.9 % IV BOLUS (SEPSIS)
1000.0000 mL | Freq: Once | INTRAVENOUS | Status: AC
  Administered 2013-05-30: 1000 mL via INTRAVENOUS

## 2013-05-30 MED ORDER — MORPHINE SULFATE 2 MG/ML IJ SOLN
2.0000 mg | Freq: Once | INTRAMUSCULAR | Status: AC
  Administered 2013-05-30: 2 mg via INTRAVENOUS
  Filled 2013-05-30: qty 1

## 2013-05-30 MED ORDER — ONDANSETRON HCL 4 MG/2ML IJ SOLN
4.0000 mg | Freq: Four times a day (QID) | INTRAMUSCULAR | Status: DC | PRN
  Administered 2013-05-31 – 2013-06-01 (×4): 4 mg via INTRAVENOUS
  Filled 2013-05-30 (×4): qty 2

## 2013-05-30 MED ORDER — LEVOTHYROXINE SODIUM 88 MCG PO TABS
88.0000 ug | ORAL_TABLET | Freq: Every day | ORAL | Status: DC
  Administered 2013-05-31 – 2013-06-02 (×3): 88 ug via ORAL
  Filled 2013-05-30 (×6): qty 1

## 2013-05-30 MED ORDER — SODIUM CHLORIDE 0.9 % IV SOLN
INTRAVENOUS | Status: DC
  Administered 2013-05-30 – 2013-05-31 (×2): via INTRAVENOUS

## 2013-05-30 MED ORDER — IOHEXOL 300 MG/ML  SOLN
80.0000 mL | Freq: Once | INTRAMUSCULAR | Status: AC | PRN
  Administered 2013-05-30: 80 mL via INTRAVENOUS

## 2013-05-30 NOTE — ED Notes (Signed)
Pt c/o left upper abdominal pain onset last night. Pt reports "I think I'm having a gallbladder attack." Pt reports nausea and loose stools.

## 2013-05-30 NOTE — ED Notes (Signed)
CT paged. 

## 2013-05-30 NOTE — Progress Notes (Addendum)
ANTIBIOTIC CONSULT NOTE - INITIAL  Pharmacy Consult for Cipro Indication: Abdominal pain, acute, generalized / Enteritis  Allergies  Allergen Reactions  . Other     Anesthesia "makes me very sick"    Patient Measurements: Height: 5\' 3"  (160 cm) Weight: 118 lb 9.7 oz (53.8 kg) IBW/kg (Calculated) : 52.4 Adjusted Body Weight:n/a  Vital Signs: Temp: 99.6 F (37.6 C) (09/19 1755) Temp src: Oral (09/19 1755) BP: 127/46 mmHg (09/19 1755) Pulse Rate: 86 (09/19 1755) Intake/Output from previous day:   Intake/Output from this shift:    Labs:  Recent Labs  05/30/13 0818  WBC 18.9*  HGB 13.7  PLT 179  CREATININE 0.77   Estimated Creatinine Clearance: 46.4 ml/min (by C-G formula based on Cr of 0.77). No results found for this basename: VANCOTROUGH, VANCOPEAK, VANCORANDOM, GENTTROUGH, GENTPEAK, GENTRANDOM, TOBRATROUGH, TOBRAPEAK, TOBRARND, AMIKACINPEAK, AMIKACINTROU, AMIKACIN,  in the last 72 hours   Microbiology: No results found for this or any previous visit (from the past 720 hour(s)).  Medical History: Past Medical History  Diagnosis Date  . Osteopenia     spine BMD T score - 1.69  . Hyperlipidemia   . Hypothyroidism   . Hx of colonic polyps   . Macular degeneration, dry   . Prediabetes     FBS 136  on 08/29/2010; A1c 6.2%  . Vertigo     Medications:  Scheduled:  . ciprofloxacin  200 mg Intravenous Once  . enoxaparin (LOVENOX) injection  40 mg Subcutaneous Q24H  . [START ON 05/31/2013] insulin aspart  0-9 Units Subcutaneous TID WC  . [START ON 05/31/2013] levothyroxine  88 mcg Oral QAC breakfast  . metronidazole  250 mg Intravenous Q6H  . metronidazole  500 mg Intravenous Once  . pantoprazole (PROTONIX) IV  40 mg Intravenous Q24H  . saccharomyces boulardii  250 mg Oral BID  . simvastatin  20 mg Oral QPM   Assessment: 79 yo female admitted with abdominal pain, CT in ED revealed area of inflammation in the jejunum suspicious for infection.  MD started  flagyl, and pharmacy asked to dose Cipro.  Weight = 53.8 kg, estimated CrCl ~ 45 ml/min.     Goal of Therapy:  Resolution of infection  Plan:  1. Cipro 400 mg IV q 12 hrs. 2. Will monitor renal function and adjust dose as needed.  Tad Moore, BCPS  Clinical Pharmacist Pager [phone]  05/30/2013 6:46 PM

## 2013-05-30 NOTE — ED Provider Notes (Signed)
Medical screening examination/treatment/procedure(s) were conducted as a shared visit with non-physician practitioner(s) and myself.  I personally evaluated the patient during the encounter  79 year old female presenting with generalized abdominal pain and nausea. No vomiting. In general well-appearing, in no distress, normal vital signs, normal heart sounds with regular rate and rhythm, lungs clear to auscultation bilaterally, abdomen soft but with generalized tenderness, no rigidity, rebound, guarding. Plan lab work and CT imaging.  Lab work shows persistently elevated lactate. CT shows evidence of enteritis. To be admitted to the hospital.  Clinical Impression: 1. Enteritis   2. Dehydration       Candyce Churn, MD 05/30/13 [phone]

## 2013-05-30 NOTE — ED Notes (Signed)
Pt returned from CT °

## 2013-05-30 NOTE — ED Provider Notes (Signed)
CSN: [id number]     Arrival date & time 05/30/13  0754 History   First MD Initiated Contact with Patient 05/30/13 [phone]     Chief Complaint  Patient presents with  . Abdominal Pain   (Consider location/radiation/quality/duration/timing/severity/associated sxs/prior Treatment) The history is provided by the patient and a relative. No language interpreter was used.  Jessica Sherman is an 79 year old female with past medical history of hyperlipidemia, hypothyroidism, osteopenia, colonic polyps presenting to the emergency department with abdominal pain that started approximately 3:00 AM this morning. Patient reported that the abdominal pain started suddenly, woke her up from sleep. Patient reported that the discomfort is localized to the epigastric, left upper quadrant region described as a bloating sensation, feels a pressure that is constant. Patient reported the discomfort radiates down to the lower abdomen. Reported that nothing makes the pain worse or better, denied using anything for alleviation. Patient reported when she had sudden onset of abdominal pain she broke out into a cold sweats. Reported that she's been having some weakness ever since the pain started. Reported that she's been feeling nauseous throughout the day, reported that she's been having dry heaves intermittently without emesis. Reported that her last bowel movement was approximately 3:00 AM this morning, reported that they were mildly loose - denied any blood. Reported that she felt fine yesterday. Denied fever, chills, melena, hematochezia, urinary symptoms, chest pain, shortness of breath, difficulty breathing, visual distortions. PCP Dr. Rockie Neighbours  Past Medical History  Diagnosis Date  . Osteopenia     spine BMD T score - 1.69  . Hyperlipidemia   . Hypothyroidism   . Hx of colonic polyps   . Macular degeneration, dry   . Prediabetes     FBS 136  on 08/29/2010; A1c 6.2%  . Vertigo    Past Surgical History  Procedure  Laterality Date  . Total abdominal hysterectomy      w/ bladder repair  . Tonsillectomy and adenoidectomy    . Pilonidal cyst excision      X 3  . Cataract extraction  2009    OS  . Colonoscopy w/ polypectomy      X 2; last 2012. Dr Ewing Schlein   Family History  Problem Relation Age of Onset  . Osteoporosis Mother   . Leukemia Mother   . Cancer Maternal Uncle     ? stomach  . Heart attack Neg Hx   . Aneurysm Father     AAA   History  Substance Use Topics  . Smoking status: Never Smoker   . Smokeless tobacco: Not on file  . Alcohol Use: 4.2 oz/week    7 Glasses of wine per week     Comment:     OB History   Grav Para Term Preterm Abortions TAB SAB Ect Mult Living                 Review of Systems  Constitutional: Negative for fever and chills.  HENT: Negative for trouble swallowing, neck pain and neck stiffness.   Eyes: Negative for visual disturbance.  Respiratory: Negative for chest tightness and shortness of breath.   Cardiovascular: Negative for chest pain.  Gastrointestinal: Positive for nausea, abdominal pain and diarrhea. Negative for vomiting, constipation and blood in stool.  Genitourinary: Negative for decreased urine volume and difficulty urinating.  Neurological: Positive for weakness. Negative for numbness and headaches.  All other systems reviewed and are negative.    Allergies  Other  Home Medications  No current outpatient prescriptions on file. BP 127/46  Pulse 86  Temp(Src) 99.6 F (37.6 C) (Oral)  Resp 16  Ht 5\' 3"  (1.6 m)  Wt 118 lb 9.7 oz (53.8 kg)  BMI 21.02 kg/m2  SpO2 93% Physical Exam  Nursing note and vitals reviewed. Constitutional: She is oriented to person, place, and time. She appears well-developed and well-nourished. No distress.  Patient found laying comfrotably in bed with family at bedside  HENT:  Head: Normocephalic and atraumatic.  Mouth/Throat: No oropharyngeal exudate.  Dry mucous membranes noted  Eyes:  Conjunctivae and EOM are normal. Pupils are equal, round, and reactive to light. Right eye exhibits no discharge. Left eye exhibits no discharge.  Neck: Normal range of motion. Neck supple.  Negative neck stiffness Negative nuchal rigidity Negative cervical lymphadenopathy  Cardiovascular: Normal rate, regular rhythm and normal heart sounds.  Exam reveals no friction rub.   No murmur heard. Pulses:      Radial pulses are 2+ on the right side, and 2+ on the left side.       Dorsalis pedis pulses are 2+ on the right side, and 2+ on the left side.  Pulmonary/Chest: Effort normal and breath sounds normal. No respiratory distress. She has no wheezes. She has no rales.  Abdominal: Soft. Bowel sounds are normal. She exhibits no distension. There is tenderness. There is no rebound and no guarding.  Tenderness upon palpation to the right and left lower quadrants of the abdomen. Negative guarding, rebound tenderness. Positive McBurney's point. Negative psoas, obturator sign. Negative Murphy's sign.  Musculoskeletal: Normal range of motion.  Lymphadenopathy:    She has no cervical adenopathy.  Neurological: She is alert and oriented to person, place, and time. She exhibits normal muscle tone. Coordination normal.  Skin: Skin is warm and dry. No rash noted. She is not diaphoretic. No erythema.  Psychiatric: She has a normal mood and affect. Her behavior is normal. Thought content normal.    ED Course  Procedures (including critical care time)  1:50 PM This provider discussed labs and imaging findings in detail with patient. Discussed with patient that we are to test whether or not she is able to tolerate fluids by mouth, reported that if she is able to without emesis we will repeat a lactic acid to see if there's any decrease in the lactic acid. If there is patient's pain is controlled we will discharge. Discussed with patient that if  lactic acid continues to remain elevated, abdominal pain continues to  be present patient is to be admitted to hospital. Family and patient understood and agreed to plan. Patient was reassessed at this time, bowel sounds normal active in all 4 quadrants with discomfort upon palpation to the right lower quadrant, suprapubic and left lower quadrant regions.  2:48 PM Patient was able to eat some crackers and drink fluids by mouth. Patient reported that she feels mildly better, reported that she still has abdominal pain the lower region. Patient reported that the abdominal pain is mildly improved. Lactic acid has been ordered.  4:40 PM Patient re-assessed. Sitting comfortably in bed with family at bedside. Reported that she feels okay. When stomach palpated, patient continues to have pain upon palpation to the lower abdomen.   4:55 PM Spoke with Dr. Standley Dakins, Family Medicine, due to continuous abdominal pain, elevated WBC, and two elevated lactic acids patient to be admitted to the hospital. Dr. Maryln Manuel recommended starting the patient on IV antibiotics. Patient to be admitted to  MedSurg, Team 10, as inpatient.    Date: 05/30/2013  Rate: 79  Rhythm: normal sinus rhythm  QRS Axis: normal  Intervals: normal  ST/T Wave abnormalities: nonspecific ST/T changes  Conduction Disutrbances:right bundle branch block incomplete  Narrative Interpretation:   Old EKG Reviewed: changes noted EKG analyzed reviewed by this provider and attending physician.  Medications  ciprofloxacin (CIPRO) IVPB 200 mg (not administered)  metroNIDAZOLE (FLAGYL) IVPB 500 mg (not administered)  levothyroxine (SYNTHROID, LEVOTHROID) tablet 88 mcg (not administered)  simvastatin (ZOCOR) tablet 20 mg (not administered)  pantoprazole (PROTONIX) injection 40 mg (not administered)  enoxaparin (LOVENOX) injection 40 mg (not administered)  0.9 %  sodium chloride infusion (not administered)  acetaminophen (TYLENOL) tablet 650 mg (not administered)    Or  acetaminophen (TYLENOL) suppository 650  mg (not administered)  morphine 2 MG/ML injection 1 mg (not administered)  magnesium hydroxide (MILK OF MAGNESIA) suspension 30 mL (not administered)  ondansetron (ZOFRAN) tablet 4 mg (not administered)    Or  ondansetron (ZOFRAN) injection 4 mg (not administered)  insulin aspart (novoLOG) injection 0-9 Units (not administered)  simethicone (MYLICON) chewable tablet 80 mg (not administered)  gi cocktail (Maalox,Lidocaine,Donnatal) (not administered)  metroNIDAZOLE (FLAGYL) IVPB 250 mg (not administered)  saccharomyces boulardii (FLORASTOR) capsule 250 mg (not administered)  temazepam (RESTORIL) capsule 7.5 mg (not administered)  sodium chloride 0.9 % bolus 1,000 mL (0 mLs Intravenous Stopped 05/30/13 1145)  ondansetron (ZOFRAN) injection 4 mg (4 mg Intravenous Given 05/30/13 0931)  morphine 2 MG/ML injection 2 mg (2 mg Intravenous Given 05/30/13 1103)  ondansetron (ZOFRAN) injection 4 mg (4 mg Intravenous Given 05/30/13 1103)  iohexol (OMNIPAQUE) 300 MG/ML solution 80 mL (80 mLs Intravenous Contrast Given 05/30/13 1210)  sodium chloride 0.9 % bolus 1,000 mL (1,000 mLs Intravenous New Bag/Given 05/30/13 1353)  morphine 2 MG/ML injection 2 mg (2 mg Intravenous Given 05/30/13 1404)  ondansetron (ZOFRAN) injection 4 mg (4 mg Intravenous Given 05/30/13 1403)    Labs Review Labs Reviewed  CBC WITH DIFFERENTIAL - Abnormal; Notable for the following:    WBC 18.9 (*)    Neutrophils Relative % 90 (*)    Neutro Abs 17.1 (*)    Lymphocytes Relative 7 (*)    All other components within normal limits  COMPREHENSIVE METABOLIC PANEL - Abnormal; Notable for the following:    Glucose, Bld 145 (*)    GFR calc non Af Amer 77 (*)    GFR calc Af Amer 90 (*)    All other components within normal limits  LACTIC ACID, PLASMA - Abnormal; Notable for the following:    Lactic Acid, Venous 3.6 (*)    All other components within normal limits  LACTIC ACID, PLASMA - Abnormal; Notable for the following:    Lactic  Acid, Venous 3.5 (*)    All other components within normal limits  CULTURE, BLOOD (ROUTINE X 2)  CULTURE, BLOOD (ROUTINE X 2)  LIPASE, BLOOD  URINALYSIS, ROUTINE W REFLEX MICROSCOPIC  CBC  CREATININE, SERUM  TROPONIN I  TROPONIN I  TROPONIN I  TSH  COMPREHENSIVE METABOLIC PANEL  CBC  HEMOGLOBIN A1C  LACTIC ACID, PLASMA  DIFFERENTIAL  POCT I-STAT TROPONIN I  POCT I-STAT TROPONIN I   Imaging Review Ct Abdomen Pelvis W Contrast  05/30/2013   CLINICAL DATA:  Left upper abdominal pain. Nausea and loose stools.  EXAM: CT ABDOMEN AND PELVIS WITH CONTRAST  TECHNIQUE: Multidetector CT imaging of the abdomen and pelvis was performed using the standard protocol following bolus  administration of intravenous contrast.  CONTRAST:  80mL OMNIPAQUE IOHEXOL 300 MG/ML  SOLN  COMPARISON:  None  FINDINGS: There is a small amount of ascites at collects in the posterior pelvic recess hand lies adjacent to the liver and spleen.  There are scattered diverticula along the colon mostly the left colon. There is no convincing diverticulitis.  There is wall thickening of a loop of jejunum in the left mid to upper quadrant. Mild stranding is seen in the adjacent mesentery. There are no other bowel inflammatory changes.  There are coarse reticular opacities at the lung bases most likely atelectasis. Infiltrate should be considered in the proper clinical setting. The heart is normal in overall size. There is a small hiatal hernia. The liver, spleen, gallbladder and pancreas are unremarkable. No bile duct dilation. Normal adrenal glands, kidneys, ureters and bladder. The uterus is surgically absent. There are no pelvic masses. No pathologically enlarged lymph nodes are seen. There are degenerative changes of the visualized spine most evident at L5-S1. No osteoblastic or osteolytic lesions.  IMPRESSION: 1. Mild wall thickening and adjacent inflammation of a loop of jejunum in the left mid to upper abdomen. This is nonspecific  and may reflect an infectious or inflammatory enteritis. It is consistent with this patient's symptoms.  2. Small amount of ascites, presumably related to the small bowel inflammation.  3.  Lung base reticular opacities most likely atelectasis.  4. Colonic diverticula without diverticulitis. Status post hysterectomy.  5.  Degenerative changes of the visualized spine.   Electronically Signed   By: Amie Portland   On: 05/30/2013 12:46    MDM   1. Enteritis   2. Dehydration   3. Vertigo   4. Abdominal pain, acute, generalized   5. Elevated lactic acid level   6. History of colon polyps   7. Hyperlipidemia   8. Hypothyroidism   9. Leukocytosis   10. Macular degeneration, dry     Patient presenting to emergency department with abdominal pain that started suddenly at approximately 3:00 AM this morning, with associated symptoms of nausea and dry heaves-negative signs of emesis. Denied fever, chills, bloody stools, melena, chest pain, shortness of breath, difficulty breathing. Alert and oriented. Heart rate and rhythm normal. Pulses palpable and strong, distal and proximal. Lungs clear to auscultation to upper and lower lobes bilaterally. Negative acute abdomen, negative peritoneal signs. Bowel sounds normoactive in all 4 quadrants. Discomfort upon palpation to the right and left lower abdomen, and suprapubic region. Positive McBurney's point. Negative psoas and obturators. Negative Murphy's sign. EKG noted changes with an incomplete right bundle branch block with ST and T wave abnormalities noted when compared to EKG that was performed on 10/04/2011. First and second set of troponins negative elevation noted - doubt ACS. CBC noted elevation white blood cell count, 18.9. Elevated lactic acid 3.6. Lipase negative elevation. Second lactic acid 3.5 - elevated. CT scan of the abdomen noted mild wall thickening and adjacent inflammation of the loop of jejunum in the left mid to upper abdomen - possible  enteritis with small ascites.  Patient given IV fluids and IV pain medications. Patient was able to eat and keep food/fluids down, but pain continued with palpation to the RLQ, suprapubic, and LLQ regions. Patient 79 y/o F with elevated WBC of 18.9, two elevated lactic acids of 3.6 and 3.5, pain not controlled in ED setting - patient to be admitted. Discussed case with Dr. Standley Dakins, recommended patient be placed on antibiotics IV to be administered,  patient admitted to MedSurg as inpatient, Team 10 for enteritis, dehydration, elevated WBC and lactic acid. Patient stable, afebrile.  Discussed with patient and family plan for admission - patient and family agreed to plan.    Raymon Mutton, PA-C 05/30/13 1835

## 2013-05-30 NOTE — ED Notes (Signed)
Pt complaining of increased pain- PA M. [place] aware and states she will put order in for pain meds- family and pt aware.

## 2013-05-30 NOTE — H&P (Signed)
History and Physical Examination  Jessica Sherman VHQ:[id number] DOB: 1930/12/15 DOA: 05/30/2013  Referring physician: Raymon Mutton, PA-C PCP: Marga Melnick, MD   Chief Complaint: abdominal pain   HPI: Jessica Sherman is an active 79 y.o. female with a past medical history significant for hypothyroidism, vertigo, colon polyps and osteopenia who was awakened in her sleep with a severe bout of epigastric abdominal pain.  The patient reports that she was very concerned that it was her gallbladder or appendix.  She's never had those organs removed.  She reports that she was previously healthy and fine.  Reports that the pain was severe and associated with nausea and vomiting.  She also had loose stools with this.  Reported that her last bowel movement was approximately 3:00 AM this morning, reported that he was mildly loose denied any blood.  She had no fever or chills. Patient reported when she had sudden onset of abdominal pain she broke out into a cold sweat.  The patient is reporting that she's been having some weakness ever since the pain started. Reported that she's been feeling nauseous throughout the day, reported that she's been having dry heaves intermittently without emesis. Pt denies melena, hematochezia, urinary symptoms, chest pain, shortness of breath, difficulty breathing, visual distortions.  The patient was seen and evaluated in the emergency department and a CT scan of the abdomen revealed that she had an area of inflammation in the jejunum suspicious for infection.  The patient also had an elevated white blood cell count of greater than 18,000 and an elevated lactic acid level.  Hospitalization was requested for further evaluation and management.  Past Medical History Past Medical History  Diagnosis Date  . Osteopenia     spine BMD T score - 1.69  . Hyperlipidemia   . Hypothyroidism   . Hx of colonic polyps   . Macular degeneration, dry   . Prediabetes     FBS 136  on  08/29/2010; A1c 6.2%  . Vertigo     Past Surgical History Past Surgical History  Procedure Laterality Date  . Total abdominal hysterectomy      w/ bladder repair  . Tonsillectomy and adenoidectomy    . Pilonidal cyst excision      X 3  . Cataract extraction  2009    OS  . Colonoscopy w/ polypectomy      X 2; last 2012. Dr Ewing Schlein    Home Meds: Prior to Admission medications   Medication Sig Start Date End Date Taking? Authorizing Provider  Calcium Carbonate (CALTRATE 600 PO) Take 600 mg by mouth 2 (two) times daily.    Yes Historical Provider, MD  Cholecalciferol (VITAMIN D3) 1000 UNITS CAPS Take 1,000 Units by mouth daily.    Yes Historical Provider, MD  levothyroxine (SYNTHROID, LEVOTHROID) 88 MCG tablet Take 88 mcg by mouth daily before breakfast.   Yes Historical Provider, MD  Multiple Vitamins-Minerals (CENTRUM SILVER PO) Take 1 tablet by mouth daily.    Yes Historical Provider, MD  Multiple Vitamins-Minerals (PRESERVISION/LUTEIN) CAPS Take 1 capsule by mouth 2 (two) times daily.    Yes Historical Provider, MD  simvastatin (ZOCOR) 40 MG tablet Take 20 mg by mouth every evening.   Yes Historical Provider, MD   Allergies: Other  Social History:  History   Social History  . Marital Status: Married    Spouse Name: N/A    Number of Children: N/A  . Years of Education: N/A   Occupational History  . retired  Social History Main Topics  . Smoking status: Never Smoker   . Smokeless tobacco: Not on file  . Alcohol Use: 4.2 oz/week    7 Glasses of wine per week     Comment:    . Drug Use: No  . Sexual Activity: Not on file   Other Topics Concern  . Not on file   Social History Narrative   Regular exercise- yes w/o symptoms   Family History:  Family History  Problem Relation Age of Onset  . Osteoporosis Mother   . Leukemia Mother   . Cancer Maternal Uncle     ? stomach  . Heart attack Neg Hx   . Aneurysm Father     AAA   Review of Systems: The  patient denies anorexia, fever, weight loss, vision loss, decreased hearing, hoarseness, chest pain, syncope, dyspnea on exertion, peripheral edema, balance deficits, hemoptysis, melena, hematochezia, severe indigestion/heartburn, hematuria, incontinence, genital sores, muscle weakness, suspicious skin lesions, transient blindness, difficulty walking, depression, unusual weight change, abnormal bleeding, enlarged lymph nodes, angioedema, and breast masses.   All other systems reviewed and reported as negative.   Physical Exam: Blood pressure 136/59, pulse 81, temperature 98 F (36.7 C), temperature source Oral, resp. rate 20, height 5\' 3"  (1.6 m), weight 52.617 kg (116 lb), SpO2 96.00%. General appearance: alert, cooperative, appears stated age and no distress Head: Normocephalic, without obvious abnormality, atraumatic Eyes: conjunctivae/corneas clear. PERRL, EOM's intact. Fundi benign. Nose: Nares normal. Septum midline. Mucosa normal. No drainage or sinus tenderness., no discharge Throat: dry mucous membranes Neck: no adenopathy, no carotid bruit, no JVD, supple, symmetrical, trachea midline and thyroid not enlarged, symmetric, no tenderness/mass/nodules Lungs: clear to auscultation bilaterally and normal percussion bilaterally Heart: regular rate and rhythm, S1, S2 normal, no murmur, click, rub or gallop Abdomen: distended, tympanitic, epigastric TTP, no guarding, no rebound TTP Extremities: extremities normal, atraumatic, no cyanosis or edema Pulses: 2+ and symmetric Skin: Skin color, texture, turgor normal. No rashes or lesions Lymph nodes: Cervical, supraclavicular, and axillary nodes normal. Neurologic: Alert and oriented X 3, normal strength and tone. Normal symmetric reflexes. Normal coordination and gait  Lab  And Imaging results:  Results for orders placed during the hospital encounter of 05/30/13 (from the past 24 hour(s))  CBC WITH DIFFERENTIAL     Status: Abnormal   Collection  Time    05/30/13  8:18 AM      Result Value Range   WBC 18.9 (*) 4.0 - 10.5 K/uL   RBC 4.27  3.87 - 5.11 MIL/uL   Hemoglobin 13.7  12.0 - 15.0 g/dL   HCT 16.1  09.6 - 04.5 %   MCV 95.6  78.0 - 100.0 fL   MCH 32.1  26.0 - 34.0 pg   MCHC 33.6  30.0 - 36.0 g/dL   RDW 40.9  81.1 - 91.4 %   Platelets 179  150 - 400 K/uL   Neutrophils Relative % 90 (*) 43 - 77 %   Neutro Abs 17.1 (*) 1.7 - 7.7 K/uL   Lymphocytes Relative 7 (*) 12 - 46 %   Lymphs Abs 1.3  0.7 - 4.0 K/uL   Monocytes Relative 3  3 - 12 %   Monocytes Absolute 0.5  0.1 - 1.0 K/uL   Eosinophils Relative 0  0 - 5 %   Eosinophils Absolute 0.0  0.0 - 0.7 K/uL   Basophils Relative 0  0 - 1 %   Basophils Absolute 0.0  0.0 - 0.1  K/uL  COMPREHENSIVE METABOLIC PANEL     Status: Abnormal   Collection Time    05/30/13  8:18 AM      Result Value Range   Sodium 135  135 - 145 mEq/L   Potassium 3.9  3.5 - 5.1 mEq/L   Chloride 96  96 - 112 mEq/L   CO2 23  19 - 32 mEq/L   Glucose, Bld 145 (*) 70 - 99 mg/dL   BUN 19  6 - 23 mg/dL   Creatinine, Ser 1.61  0.50 - 1.10 mg/dL   Calcium 9.9  8.4 - 09.6 mg/dL   Total Protein 7.4  6.0 - 8.3 g/dL   Albumin 3.9  3.5 - 5.2 g/dL   AST 25  0 - 37 U/L   ALT 17  0 - 35 U/L   Alkaline Phosphatase 65  39 - 117 U/L   Total Bilirubin 0.4  0.3 - 1.2 mg/dL   GFR calc non Af Amer 77 (*) >90 mL/min   GFR calc Af Amer 90 (*) >90 mL/min  LIPASE, BLOOD     Status: None   Collection Time    05/30/13  8:18 AM      Result Value Range   Lipase 41  11 - 59 U/L  POCT I-STAT TROPONIN I     Status: None   Collection Time    05/30/13  8:56 AM      Result Value Range   Troponin i, poc 0.00  0.00 - 0.08 ng/mL   Comment 3           LACTIC ACID, PLASMA     Status: Abnormal   Collection Time    05/30/13  9:15 AM      Result Value Range   Lactic Acid, Venous 3.6 (*) 0.5 - 2.2 mmol/L  POCT I-STAT TROPONIN I     Status: None   Collection Time    05/30/13  1:03 PM      Result Value Range   Troponin i, poc  0.00  0.00 - 0.08 ng/mL   Comment 3           URINALYSIS, ROUTINE W REFLEX MICROSCOPIC     Status: None   Collection Time    05/30/13  1:31 PM      Result Value Range   Color, Urine YELLOW  YELLOW   APPearance CLEAR  CLEAR   Specific Gravity, Urine 1.015  1.005 - 1.030   pH 5.5  5.0 - 8.0   Glucose, UA NEGATIVE  NEGATIVE mg/dL   Hgb urine dipstick NEGATIVE  NEGATIVE   Bilirubin Urine NEGATIVE  NEGATIVE   Ketones, ur NEGATIVE  NEGATIVE mg/dL   Protein, ur NEGATIVE  NEGATIVE mg/dL   Urobilinogen, UA 0.2  0.0 - 1.0 mg/dL   Nitrite NEGATIVE  NEGATIVE   Leukocytes, UA NEGATIVE  NEGATIVE  LACTIC ACID, PLASMA     Status: Abnormal   Collection Time    05/30/13  3:49 PM      Result Value Range   Lactic Acid, Venous 3.5 (*) 0.5 - 2.2 mmol/L   Impression / Plan   Abdominal pain, acute, generalized / Enteritis Admit for further treatment Start cipro/flagyl IV Clear liquid diet for now Zofran IV as needed for nausea and vomiting Simethicone for bloating, GI cocktail, probiotics IV pain meds as needed to control pain Low threshold for GI consult if not improving  Elevated lactic acid level / Leukocytosis / Dehydration Aggressive IV fluid hydration  initiated Monitor levels Blood cultures x 2 ordered Monitor closely and low threshold to transfer to higher level of care for any decompensation  Prediabetes Monitor blood glucose   History of Vertigo Pt reports that she takes meclizine prn for symptoms  Hypothyroidism Resume home meds    VITAMIN D DEFICIENCY /   OSTEOPENIA Resume home meds   Family updated at bedside  Standley Dakins MD [facility] [place], [place] [phone] 05/30/2013, 5:31 PM

## 2013-05-30 NOTE — ED Notes (Signed)
Pt tolerating fluids well. 

## 2013-05-31 LAB — CBC
Hemoglobin: 10.9 g/dL — ABNORMAL LOW (ref 12.0–15.0)
MCH: 32 pg (ref 26.0–34.0)
MCHC: 34 g/dL (ref 30.0–36.0)
Platelets: 180 10*3/uL (ref 150–400)
RDW: 13 % (ref 11.5–15.5)

## 2013-05-31 LAB — COMPREHENSIVE METABOLIC PANEL
ALT: 11 U/L (ref 0–35)
Albumin: 2.7 g/dL — ABNORMAL LOW (ref 3.5–5.2)
Alkaline Phosphatase: 58 U/L (ref 39–117)
CO2: 23 mEq/L (ref 19–32)
Calcium: 8.3 mg/dL — ABNORMAL LOW (ref 8.4–10.5)
GFR calc Af Amer: >90 mL/min (ref >90)
GFR calc non Af Amer: 78 mL/min — ABNORMAL LOW (ref >90)
Glucose, Bld: 127 mg/dL — ABNORMAL HIGH (ref 70–99)
Potassium: 3.8 mEq/L (ref 3.5–5.1)
Sodium: 130 mEq/L — ABNORMAL LOW (ref 135–145)
Total Protein: 5.8 g/dL — ABNORMAL LOW (ref 6.0–8.3)

## 2013-05-31 LAB — DIFFERENTIAL
Eosinophils Absolute: 0 10*3/uL (ref 0.0–0.7)
Eosinophils Relative: 0 % (ref 0–5)
Lymphocytes Relative: 7 % — ABNORMAL LOW (ref 12–46)
Lymphs Abs: 1 10*3/uL (ref 0.7–4.0)
Monocytes Absolute: 0.7 10*3/uL (ref 0.1–1.0)
Monocytes Relative: 4 % (ref 3–12)
Neutrophils Relative %: 89 % — ABNORMAL HIGH (ref 43–77)

## 2013-05-31 LAB — TROPONIN I: Troponin I: <0.3 ng/mL (ref <0.30)

## 2013-05-31 LAB — HEMOGLOBIN A1C
Hgb A1c MFr Bld: 6 % — ABNORMAL HIGH (ref <5.7)
Mean Plasma Glucose: 126 mg/dL — ABNORMAL HIGH (ref <117)

## 2013-05-31 LAB — TSH: TSH: 1.215 u[IU]/mL (ref 0.350–4.500)

## 2013-05-31 LAB — GLUCOSE, CAPILLARY
Glucose-Capillary: 121 mg/dL — ABNORMAL HIGH (ref 70–99)
Glucose-Capillary: 111 mg/dL — ABNORMAL HIGH (ref 70–99)

## 2013-05-31 LAB — CLOSTRIDIUM DIFFICILE BY PCR: Toxigenic C. Difficile by PCR: NEGATIVE

## 2013-05-31 LAB — LACTIC ACID, PLASMA: Lactic Acid, Venous: 1.4 mmol/L (ref 0.5–2.2)

## 2013-05-31 MED ORDER — CHLORHEXIDINE GLUCONATE 0.12 % MT SOLN
15.0000 mL | Freq: Two times a day (BID) | OROMUCOSAL | Status: DC
  Administered 2013-05-31 (×2): 15 mL via OROMUCOSAL
  Filled 2013-05-31 (×2): qty 15

## 2013-05-31 MED ORDER — BIOTENE DRY MOUTH MT LIQD: 15.0000 mL | Freq: Two times a day (BID) | OROMUCOSAL | Status: DC

## 2013-05-31 NOTE — Progress Notes (Signed)
[facility] PROGRESS NOTE  [name]:[id number] DOB: [birth year] DOA: 05/30/2013 PCP: Marga Melnick, MD  HPI: Jessica Sherman is an active 79 y.o. female with a past medical history significant for hypothyroidism, vertigo, colon polyps and osteopenia who was awakened in her sleep with a severe bout of epigastric abdominal pain with nausea and vomiting. The patient was seen and evaluated in the emergency department and a CT scan of the abdomen revealed that she had an area of inflammation in the jejunum suspicious for infection. The patient also had an elevated white blood cell count of greater than 18,000 and an elevated lactic acid level.   Assessment/Plan:  Abdominal pain, acute, generalized / Enteritis - improved overnight with hydration and antibiotics. C diff negative. GI pathogen panel and fecal lactoferrin ordered and pending. Her nausea has improved and WBC is better this morning. Lactic acid is now normal. Will advance her diet and monitor. She still has ongoing diarrhea.  Elevated lactic acid level / Leukocytosis / Dehydration - improving.  History of Vertigo - Pt reports that she takes meclizine prn for symptoms  Hypothyroidism - Resume home meds  VITAMIN D DEFICIENCY / OSTEOPENIA - Resume home meds   Code Status: Full Family Communication: none  Disposition Plan: inpatient  Consultants:  none  Procedures:  none  Anti-infectives   Start     Dose/Rate Route Frequency Ordered Stop   05/31/13 0600  ciprofloxacin (CIPRO) IVPB 400 mg  Status:  Discontinued     400 mg 200 mL/hr over 60 Minutes Intravenous Every 24 hours 05/30/13 1851 05/30/13 1853   05/31/13 0600  ciprofloxacin (CIPRO) IVPB 400 mg     400 mg 200 mL/hr over 60 Minutes Intravenous Every 12 hours 05/30/13 1853     05/30/13 1900  metroNIDAZOLE (FLAGYL) IVPB 250 mg     250 mg 50 mL/hr over 60 Minutes Intravenous 4 times per day 05/30/13 1812     05/30/13 1830  ciprofloxacin (CIPRO) IVPB  200 mg     200 mg 100 mL/hr over 60 Minutes Intravenous  Once 05/30/13 1741 05/30/13 2113   05/30/13 1745  metroNIDAZOLE (FLAGYL) IVPB 500 mg     500 mg 100 mL/hr over 60 Minutes Intravenous  Once 05/30/13 1741       Antibiotics Given (last 72 hours)   Date/Time Action Medication Dose Rate   05/30/13 2012 Given   metroNIDAZOLE (FLAGYL) IVPB 250 mg 250 mg 50 mL/hr   05/30/13 2013 Given   ciprofloxacin (CIPRO) IVPB 200 mg 200 mg 100 mL/hr   05/31/13 0106 Given   metroNIDAZOLE (FLAGYL) IVPB 250 mg 250 mg 50 mL/hr   05/31/13 0539 Given   metroNIDAZOLE (FLAGYL) IVPB 250 mg 250 mg 50 mL/hr   05/31/13 0541 Given   ciprofloxacin (CIPRO) IVPB 400 mg 400 mg 200 mL/hr      HPI/Subjective: - subjectively better, abdominal pain better, still "sore", no nausea. Still having diarrhea, denies blood in her stool.  Objective: Filed Vitals:   05/30/13 1730 05/30/13 1755 05/30/13 2207 05/31/13 0500  BP: 106/84 127/46 122/56 132/60  Pulse: 85 86 83 77  Temp:  99.6 F (37.6 C) 99.3 F (37.4 C) 98.9 F (37.2 C)  TempSrc:  Oral Oral Oral  Resp: 20 16 18 18   Height:  5\' 3"  (1.6 m)    Weight:  53.8 kg (118 lb 9.7 oz)  54 kg (119 lb 0.8 oz)  SpO2: 90% 93% 94% 95%   No intake or output data  in the 24 hours ending 05/31/13 1103 Filed Weights   05/30/13 0758 05/30/13 1755 05/31/13 0500  Weight: 52.617 kg (116 lb) 53.8 kg (118 lb 9.7 oz) 54 kg (119 lb 0.8 oz)    Exam:   General:  NAD  Cardiovascular: regular rate and rhythm, without MRG  Respiratory: good air movement, clear to auscultation throughout, no wheezing, ronchi or rales  Abdomen: soft, not tender to palpation, positive bowel sounds  MSK: no peripheral edema  Neuro: non focal  Data Reviewed: Basic Metabolic Panel:  Recent Labs Lab 05/30/13 0818 05/30/13 1830 05/31/13 0530  NA 135  --  130*  K 3.9  --  3.8  CL 96  --  97  CO2 23  --  23  GLUCOSE 145*  --  127*  BUN 19  --  12  CREATININE 0.77 0.72 0.74  CALCIUM  9.9  --  8.3*   Liver Function Tests:  Recent Labs Lab 05/30/13 0818 05/31/13 0530  AST 25 18  ALT 17 11  ALKPHOS 65 58  BILITOT 0.4 0.5  PROT 7.4 5.8*  ALBUMIN 3.9 2.7*    Recent Labs Lab 05/30/13 0818  LIPASE 41   CBC:  Recent Labs Lab 05/30/13 0818 05/30/13 1830 05/31/13 0530  WBC 18.9* 20.6* 15.3*  NEUTROABS 17.1*  --  13.6*  HGB 13.7 11.1* 10.9*  HCT 40.8 32.3* 32.1*  MCV 95.6 93.6 94.1  PLT 179 192 180   Cardiac Enzymes:  Recent Labs Lab 05/30/13 1813 05/30/13 2316 05/31/13 0530  TROPONINI <0.30 <0.30 <0.30   CBG:  Recent Labs Lab 05/30/13 1825 05/30/13 2210 05/31/13 0759  GLUCAP 133* 115* 121*   Recent Results (from the past 240 hour(s))  CLOSTRIDIUM DIFFICILE BY PCR     Status: None   Collection Time    05/31/13  8:51 AM      Result Value Range Status   C difficile by pcr NEGATIVE  NEGATIVE Final   Studies: Ct Abdomen Pelvis W Contrast  05/30/2013   CLINICAL DATA:  Left upper abdominal pain. Nausea and loose stools.  EXAM: CT ABDOMEN AND PELVIS WITH CONTRAST  TECHNIQUE: Multidetector CT imaging of the abdomen and pelvis was performed using the standard protocol following bolus administration of intravenous contrast.  CONTRAST:  80mL OMNIPAQUE IOHEXOL 300 MG/ML  SOLN  COMPARISON:  None  FINDINGS: There is a small amount of ascites at collects in the posterior pelvic recess hand lies adjacent to the liver and spleen.  There are scattered diverticula along the colon mostly the left colon. There is no convincing diverticulitis.  There is wall thickening of a loop of jejunum in the left mid to upper quadrant. Mild stranding is seen in the adjacent mesentery. There are no other bowel inflammatory changes.  There are coarse reticular opacities at the lung bases most likely atelectasis. Infiltrate should be considered in the proper clinical setting. The heart is normal in overall size. There is a small hiatal hernia. The liver, spleen, gallbladder and  pancreas are unremarkable. No bile duct dilation. Normal adrenal glands, kidneys, ureters and bladder. The uterus is surgically absent. There are no pelvic masses. No pathologically enlarged lymph nodes are seen. There are degenerative changes of the visualized spine most evident at L5-S1. No osteoblastic or osteolytic lesions.  IMPRESSION: 1. Mild wall thickening and adjacent inflammation of a loop of jejunum in the left mid to upper abdomen. This is nonspecific and may reflect an infectious or inflammatory enteritis. It is consistent  with this patient's symptoms.  2. Small amount of ascites, presumably related to the small bowel inflammation.  3.  Lung base reticular opacities most likely atelectasis.  4. Colonic diverticula without diverticulitis. Status post hysterectomy.  5.  Degenerative changes of the visualized spine.   Electronically Signed   By: Amie Portland   On: 05/30/2013 12:46    Scheduled Meds: . antiseptic oral rinse  15 mL Mouth Rinse q12n4p  . chlorhexidine  15 mL Mouth Rinse BID  . ciprofloxacin  400 mg Intravenous Q12H  . enoxaparin (LOVENOX) injection  40 mg Subcutaneous Q24H  . levothyroxine  88 mcg Oral QAC breakfast  . metronidazole  250 mg Intravenous Q6H  . metronidazole  500 mg Intravenous Once  . pantoprazole (PROTONIX) IV  40 mg Intravenous Q24H  . saccharomyces boulardii  250 mg Oral BID  . simvastatin  20 mg Oral QPM   Continuous Infusions: . sodium chloride 80 mL/hr at 05/30/13 2011   Principal Problem:   Abdominal pain, acute, generalized Active Problems:   VITAMIN D DEFICIENCY   OSTEOPENIA   History of colon polyps   Prediabetes   Hypothyroidism   Hyperlipidemia   Enteritis   Leukocytosis   Dehydration   Elevated lactic acid level  Time spent: 35  Pamella Pert, MD [facility] Pager [phone]. If 7 PM - 7 AM, please contact night-coverage at [email or web address], password [facility] 05/31/2013, 11:03 AM  LOS: 1 day

## 2013-05-31 NOTE — Progress Notes (Signed)
PT Cancellation Note  Patient Details Name: Jessica Sherman MRN: [id number] DOB: [birth year]   Cancelled Treatment:    Reason Eval/Treat Not Completed: Fatigue/lethargy limiting ability to participate.  Patient declined PT session following max encouragement provided.  Will return in am for PT evaluation.   Vena Austria 05/31/2013, 5:17 PM Durenda Hurt. Renaldo Fiddler, [facility] Acute Rehab Services Pager [phone]

## 2013-05-31 NOTE — Progress Notes (Signed)
ANTIBIOTIC CONSULT NOTE - FOLLOW UP  Pharmacy Consult for Cipro Indication: Abdominal pain, acute, generalized / Enteritis  Assessment: Afebrile. WBC 15.3 down. Scr 0.74 with estimated CrCl 42 79 yo female admitted with abdominal pain, CT in ED revealed area of inflammation in the jejunum suspicious for infection.  Plan:  Continue Cipro 400mg  IV q12 hrs. Dose ok down to a CrCl of 30 Pharmacy will sign off. Please reconsult for further dosing assistance.  Alyxis Grippi S. Merilynn Finland, PharmD, BCPS Clinical Staff Pharmacist Pager [phone]   Misty Stanley Stillinger 05/31/2013,12:49 PM

## 2013-06-01 DIAGNOSIS — E876 Hypokalemia: Secondary | ICD-10-CM

## 2013-06-01 DIAGNOSIS — E871 Hypo-osmolality and hyponatremia: Secondary | ICD-10-CM | POA: Diagnosis present

## 2013-06-01 LAB — GLUCOSE, CAPILLARY: Glucose-Capillary: 117 mg/dL — ABNORMAL HIGH (ref 70–99)

## 2013-06-01 LAB — BASIC METABOLIC PANEL
BUN: 8 mg/dL (ref 6–23)
Calcium: 8.4 mg/dL (ref 8.4–10.5)
GFR calc Af Amer: >90 mL/min (ref >90)
GFR calc non Af Amer: 81 mL/min — ABNORMAL LOW (ref >90)
Glucose, Bld: 101 mg/dL — ABNORMAL HIGH (ref 70–99)
Potassium: 3.4 mEq/L — ABNORMAL LOW (ref 3.5–5.1)
Sodium: 129 mEq/L — ABNORMAL LOW (ref 135–145)

## 2013-06-01 LAB — FECAL LACTOFERRIN, QUANT: Fecal Lactoferrin: NEGATIVE

## 2013-06-01 LAB — CBC
Hemoglobin: 10.6 g/dL — ABNORMAL LOW (ref 12.0–15.0)
MCH: 31.5 pg (ref 26.0–34.0)
MCHC: 33.7 g/dL (ref 30.0–36.0)
Platelets: 168 10*3/uL (ref 150–400)
RDW: 12.7 % (ref 11.5–15.5)

## 2013-06-01 MED ORDER — POTASSIUM CHLORIDE CRYS ER 20 MEQ PO TBCR
40.0000 meq | EXTENDED_RELEASE_TABLET | Freq: Once | ORAL | Status: AC
  Administered 2013-06-01: 40 meq via ORAL
  Filled 2013-06-01: qty 2

## 2013-06-01 NOTE — ED Provider Notes (Signed)
Medical screening examination/treatment/procedure(s) were conducted as a shared visit with non-physician practitioner(s) and myself.  I personally evaluated the patient during the encounter.   Please see my separate note.     Candyce Churn, MD 06/01/13 [phone]

## 2013-06-01 NOTE — Progress Notes (Signed)
PT Cancellation Note  Patient Details Name: Jessica Sherman MRN: [id number] DOB: 1931/08/26   Cancelled Treatment:    Reason Eval/Treat Not Completed: Other (comment) Noted order to discontinue PT after had walked in hallway with pt; Pt and MD notified of error Will sign off   Van Clines [facility] 06/01/2013, 4:24 PM

## 2013-06-01 NOTE — Progress Notes (Signed)
[facility] PROGRESS NOTE  Jessica Sherman ZOX:[id number] DOB: May 26, 1931 DOA: 05/30/2013 PCP: Marga Melnick, MD  HPI: Jessica Sherman is an active 79 y.o. female with a past medical history significant for hypothyroidism, vertigo, colon polyps and osteopenia who was awakened in her sleep with a severe bout of epigastric abdominal pain with nausea and vomiting. The patient was seen and evaluated in the emergency department and a CT scan of the abdomen revealed that she had an area of inflammation in the jejunum suspicious for infection. The patient also had an elevated white blood cell count of greater than 18,000 and an elevated lactic acid level.   Assessment/Plan:  Abdominal pain, acute, generalized / Enteritis - improving.  WBC much better.  Advance diet.  D/C ivf. Increase activity.  Home in am if stable  Elevated lactic acid level / Leukocytosis / Dehydration resolved  Hypothyroidism: TSH ok  Hypokalemia: replete  Hyponatremia: monitor  Code Status: Full Family Communication: none  Disposition Plan: inpatient  Consultants:  none  Procedures:  none  Anti-infectives   Start     Dose/Rate Route Frequency Ordered Stop   05/31/13 0600  ciprofloxacin (CIPRO) IVPB 400 mg  Status:  Discontinued     400 mg 200 mL/hr over 60 Minutes Intravenous Every 24 hours 05/30/13 1851 05/30/13 1853   05/31/13 0600  ciprofloxacin (CIPRO) IVPB 400 mg     400 mg 200 mL/hr over 60 Minutes Intravenous Every 12 hours 05/30/13 1853     05/30/13 1900  metroNIDAZOLE (FLAGYL) IVPB 250 mg     250 mg 50 mL/hr over 60 Minutes Intravenous 4 times per day 05/30/13 1812     05/30/13 1830  ciprofloxacin (CIPRO) IVPB 200 mg     200 mg 100 mL/hr over 60 Minutes Intravenous  Once 05/30/13 1741 05/30/13 2113   05/30/13 1745  metroNIDAZOLE (FLAGYL) IVPB 500 mg     500 mg 100 mL/hr over 60 Minutes Intravenous  Once 05/30/13 1741       Antibiotics Given (last 72 hours)   Date/Time Action  Medication Dose Rate   05/30/13 2012 Given   metroNIDAZOLE (FLAGYL) IVPB 250 mg 250 mg 50 mL/hr   05/30/13 2013 Given   ciprofloxacin (CIPRO) IVPB 200 mg 200 mg 100 mL/hr   05/31/13 0106 Given   metroNIDAZOLE (FLAGYL) IVPB 250 mg 250 mg 50 mL/hr   05/31/13 0539 Given   metroNIDAZOLE (FLAGYL) IVPB 250 mg 250 mg 50 mL/hr   05/31/13 0541 Given   ciprofloxacin (CIPRO) IVPB 400 mg 400 mg 200 mL/hr   05/31/13 1313 Given   metroNIDAZOLE (FLAGYL) IVPB 250 mg 250 mg 50 mL/hr   05/31/13 1704 Given   metroNIDAZOLE (FLAGYL) IVPB 250 mg 250 mg 50 mL/hr   05/31/13 1747 Given   ciprofloxacin (CIPRO) IVPB 400 mg 400 mg 200 mL/hr   06/01/13 0000 Given   metroNIDAZOLE (FLAGYL) IVPB 250 mg 250 mg 50 mL/hr   06/01/13 0523 Given   metroNIDAZOLE (FLAGYL) IVPB 250 mg 250 mg 50 mL/hr   06/01/13 0523 Given   ciprofloxacin (CIPRO) IVPB 400 mg 400 mg 200 mL/hr   06/01/13 1234 Given   metroNIDAZOLE (FLAGYL) IVPB 250 mg 250 mg 50 mL/hr   06/01/13 1710 Given   metroNIDAZOLE (FLAGYL) IVPB 250 mg 250 mg 50 mL/hr      HPI/Subjective: No pain. No N/V/D  Objective: Filed Vitals:   05/31/13 2236 06/01/13 0500 06/01/13 0755 06/01/13 1424  BP: 140/63 117/57  144/56  Pulse: 78 83  73  Temp: 97.9 F (36.6 C) 98.3 F (36.8 C)  97.9 F (36.6 C)  TempSrc: Oral Oral    Resp: 18 18  20   Height:      Weight:   59.104 kg (130 lb 4.8 oz)   SpO2: 95% 93%  99%    Intake/Output Summary (Last 24 hours) at 06/01/13 1821 Last data filed at 06/01/13 1400  Gross per 24 hour  Intake   1000 ml  Output      0 ml  Net   1000 ml   Filed Weights   05/30/13 1755 05/31/13 0500 06/01/13 0755  Weight: 53.8 kg (118 lb 9.7 oz) 54 kg (119 lb 0.8 oz) 59.104 kg (130 lb 4.8 oz)    Exam:   General:  NAD  HEENT:  MMM  Cardiovascular: regular rate and rhythm, without MRG  Respiratory: good air movement, clear to auscultation throughout, no wheezing, ronchi or rales  Abdomen: soft, not tender to palpation, positive  bowel sounds  MSK: no peripheral edema  Neuro: non focal  Data Reviewed: Basic Metabolic Panel:  Recent Labs Lab 05/30/13 0818 05/30/13 1830 05/31/13 0530 06/01/13 0500  NA 135  --  130* 129*  K 3.9  --  3.8 3.4*  CL 96  --  97 97  CO2 23  --  23 20  GLUCOSE 145*  --  127* 101*  BUN 19  --  12 8  CREATININE 0.77 0.72 0.74 0.67  CALCIUM 9.9  --  8.3* 8.4   Liver Function Tests:  Recent Labs Lab 05/30/13 0818 05/31/13 0530  AST 25 18  ALT 17 11  ALKPHOS 65 58  BILITOT 0.4 0.5  PROT 7.4 5.8*  ALBUMIN 3.9 2.7*    Recent Labs Lab 05/30/13 0818  LIPASE 41   CBC:  Recent Labs Lab 05/30/13 0818 05/30/13 1830 05/31/13 0530 06/01/13 1040  WBC 18.9* 20.6* 15.3* 10.8*  NEUTROABS 17.1*  --  13.6*  --   HGB 13.7 11.1* 10.9* 10.6*  HCT 40.8 32.3* 32.1* 31.5*  MCV 95.6 93.6 94.1 93.8  PLT 179 192 180 168   Cardiac Enzymes:  Recent Labs Lab 05/30/13 1813 05/30/13 2316 05/31/13 0530  TROPONINI <0.30 <0.30 <0.30   CBG:  Recent Labs Lab 05/30/13 1825 05/30/13 2210 05/31/13 0759 05/31/13 2236 06/01/13 0806  GLUCAP 133* 115* 121* 111* 117*   Recent Results (from the past 240 hour(s))  CULTURE, BLOOD (ROUTINE X 2)     Status: None   Collection Time    05/30/13  7:10 PM      Result Value Range Status   Specimen Description BLOOD ARM LEFT   Final   Special Requests BOTTLES DRAWN AEROBIC ONLY 10CC   Final   Culture  Setup Time     Final   Value: 05/31/2013 04:52     Performed at [facility]   Culture     Final   Value:        BLOOD CULTURE RECEIVED NO GROWTH TO DATE CULTURE WILL BE HELD FOR 5 DAYS BEFORE ISSUING A FINAL NEGATIVE REPORT     Performed at [facility]   Report Status PENDING   Incomplete  CULTURE, BLOOD (ROUTINE X 2)     Status: None   Collection Time    05/30/13  7:15 PM      Result Value Range Status   Specimen Description BLOOD ARM RIGHT   Final   Special Requests BOTTLES DRAWN AEROBIC AND  ANAEROBIC 10CC   Final    Culture  Setup Time     Final   Value: 05/31/2013 04:53     Performed at [facility]   Culture     Final   Value:        BLOOD CULTURE RECEIVED NO GROWTH TO DATE CULTURE WILL BE HELD FOR 5 DAYS BEFORE ISSUING A FINAL NEGATIVE REPORT     Performed at [facility]   Report Status PENDING   Incomplete  CLOSTRIDIUM DIFFICILE BY PCR     Status: None   Collection Time    05/31/13  8:51 AM      Result Value Range Status   C difficile by pcr NEGATIVE  NEGATIVE Final   Studies: No results found.  Scheduled Meds: . ciprofloxacin  400 mg Intravenous Q12H  . enoxaparin (LOVENOX) injection  40 mg Subcutaneous Q24H  . levothyroxine  88 mcg Oral QAC breakfast  . metronidazole  250 mg Intravenous Q6H  . metronidazole  500 mg Intravenous Once  . saccharomyces boulardii  250 mg Oral BID  . simvastatin  20 mg Oral QPM   Continuous Infusions:   Time spent: 25  Crista Curb, MD [facility] Pager [phone]. If 7 PM - 7 AM, please contact night-coverage at [email or web address], password [facility] 06/01/2013, 6:21 PM  LOS: 2 days

## 2013-06-02 LAB — CBC
Hemoglobin: 10.3 g/dL — ABNORMAL LOW (ref 12.0–15.0)
MCH: 32 pg (ref 26.0–34.0)
MCV: 91 fL (ref 78.0–100.0)
Platelets: 181 10*3/uL (ref 150–400)
RBC: 3.22 MIL/uL — ABNORMAL LOW (ref 3.87–5.11)
WBC: 9.7 10*3/uL (ref 4.0–10.5)

## 2013-06-02 LAB — BASIC METABOLIC PANEL
Calcium: 8 mg/dL — ABNORMAL LOW (ref 8.4–10.5)
GFR calc Af Amer: >90 mL/min (ref >90)
GFR calc non Af Amer: 83 mL/min — ABNORMAL LOW (ref >90)
Glucose, Bld: 99 mg/dL (ref 70–99)
Potassium: 3.4 mEq/L — ABNORMAL LOW (ref 3.5–5.1)
Sodium: 132 mEq/L — ABNORMAL LOW (ref 135–145)

## 2013-06-02 LAB — GI PATHOGEN PANEL BY PCR, STOOL
Campylobacter by PCR: NEGATIVE
E coli (ETEC) LT/ST: NEGATIVE
E coli (STEC): NEGATIVE
Norovirus GI/GII: NEGATIVE
Rotavirus A by PCR: NEGATIVE
Salmonella by PCR: NEGATIVE
Shigella by PCR: NEGATIVE

## 2013-06-02 MED ORDER — POTASSIUM CHLORIDE CRYS ER 20 MEQ PO TBCR
40.0000 meq | EXTENDED_RELEASE_TABLET | Freq: Once | ORAL | Status: AC
  Administered 2013-06-02: 40 meq via ORAL
  Filled 2013-06-02: qty 2

## 2013-06-02 MED ORDER — CIPROFLOXACIN HCL 500 MG PO TABS: 500.0000 mg | ORAL_TABLET | Freq: Two times a day (BID) | ORAL | Status: DC

## 2013-06-02 MED ORDER — PROMETHAZINE HCL 12.5 MG PO TABS: 12.5000 mg | ORAL_TABLET | Freq: Four times a day (QID) | ORAL | Status: DC | PRN

## 2013-06-02 NOTE — Discharge Summary (Signed)
Physician Discharge Summary  Jessica Sherman ZOX:[id number] DOB: Jan 12, 1931 DOA: 05/30/2013  PCP: Marga Melnick, MD  Admit date: 05/30/2013 Discharge date: 06/02/2013  Time spent: 30 minutes  Discharge Diagnoses:  Enteritis, presumed infectious    VITAMIN D DEFICIENCY   OSTEOPENIA   History of colon polyps   Prediabetes   Hypothyroidism   Hyperlipidemia   Leukocytosis   Dehydration   Elevated lactic acid level   Hypokalemia   Hyponatremia   Discharge Condition: stable  Filed Weights   05/31/13 0500 06/01/13 0755 06/02/13 0500  Weight: 54 kg (119 lb 0.8 oz) 59.104 kg (130 lb 4.8 oz) 58.7 kg (129 lb 6.6 oz)    History of present illness:  Jessica Sherman is an active 79 y.o. female with a past medical history significant for hypothyroidism, vertigo, colon polyps and osteopenia who was awakened in her sleep with a severe bout of epigastric abdominal pain. The patient reports that she was very concerned that it was her gallbladder or appendix. She's never had those organs removed. She reports that she was previously healthy and fine. Reports that the pain was severe and associated with nausea and vomiting. She also had loose stools with this. Reported that her last bowel movement was approximately 3:00 AM this morning, reported that he was mildly loose denied any blood. She had no fever or chills. Patient reported when she had sudden onset of abdominal pain she broke out into a cold sweat. The patient is reporting that she's been having some weakness ever since the pain started. Reported that she's been feeling nauseous throughout the day, reported that she's been having dry heaves intermittently without emesis. Pt denies melena, hematochezia, urinary symptoms, chest pain, shortness of breath, difficulty breathing, visual distortions. The patient was seen and evaluated in the emergency department and a CT scan of the abdomen revealed that she had an area of inflammation in the  jejunum suspicious for infection. The patient also had an elevated white blood cell count of greater than 18,000 and an elevated lactic acid level. Hospitalization was requested for further evaluation and management.   Hospital Course:  Fluid resussitated, started on empiric cipro and flagyl.  C diff negative.  GI pathogen panel pending.  Electrolyte disturbances corrected.  By discharge, stable vital signs, tolerating solids, no diarrhea, ambulating.  Discharge Exam: Filed Vitals:   06/02/13 0447  BP: 154/65  Pulse: 68  Temp: 98.6 F (37 C)  Resp: 18    General: nontoxic Cardiovascular: RRR without MGR Respiratory: CTA without WRR abd soft, nontender  Discharge Instructions  Discharge Orders   Future Orders Complete By Expires   Activity as tolerated - No restrictions  As directed    Diet general  As directed        Medication List         CALTRATE 600 PO  Take 600 mg by mouth 2 (two) times daily.     CENTRUM SILVER PO  Take 1 tablet by mouth daily.     PRESERVISION/LUTEIN Caps  Take 1 capsule by mouth 2 (two) times daily.     ciprofloxacin 500 MG tablet  Commonly known as:  CIPRO  Take 1 tablet (500 mg total) by mouth 2 (two) times daily.     levothyroxine 88 MCG tablet  Commonly known as:  SYNTHROID, LEVOTHROID  Take 88 mcg by mouth daily before breakfast.     promethazine 12.5 MG tablet  Commonly known as:  PHENERGAN  Take 1 tablet (12.5 mg  total) by mouth every 6 (six) hours as needed for nausea.     simvastatin 40 MG tablet  Commonly known as:  ZOCOR  Take 20 mg by mouth every evening.     Vitamin D3 1000 UNITS Caps  Take 1,000 Units by mouth daily.       Allergies  Allergen Reactions  . Other     Anesthesia "makes me very sick"      The results of significant diagnostics from this hospitalization (including imaging, microbiology, ancillary and laboratory) are listed below for reference.    Significant Diagnostic Studies: Ct Abdomen  Pelvis W Contrast  05/30/2013   CLINICAL DATA:  Left upper abdominal pain. Nausea and loose stools.  EXAM: CT ABDOMEN AND PELVIS WITH CONTRAST  TECHNIQUE: Multidetector CT imaging of the abdomen and pelvis was performed using the standard protocol following bolus administration of intravenous contrast.  CONTRAST:  80mL OMNIPAQUE IOHEXOL 300 MG/ML  SOLN  COMPARISON:  None  FINDINGS: There is a small amount of ascites at collects in the posterior pelvic recess hand lies adjacent to the liver and spleen.  There are scattered diverticula along the colon mostly the left colon. There is no convincing diverticulitis.  There is wall thickening of a loop of jejunum in the left mid to upper quadrant. Mild stranding is seen in the adjacent mesentery. There are no other bowel inflammatory changes.  There are coarse reticular opacities at the lung bases most likely atelectasis. Infiltrate should be considered in the proper clinical setting. The heart is normal in overall size. There is a small hiatal hernia. The liver, spleen, gallbladder and pancreas are unremarkable. No bile duct dilation. Normal adrenal glands, kidneys, ureters and bladder. The uterus is surgically absent. There are no pelvic masses. No pathologically enlarged lymph nodes are seen. There are degenerative changes of the visualized spine most evident at L5-S1. No osteoblastic or osteolytic lesions.  IMPRESSION: 1. Mild wall thickening and adjacent inflammation of a loop of jejunum in the left mid to upper abdomen. This is nonspecific and may reflect an infectious or inflammatory enteritis. It is consistent with this patient's symptoms.  2. Small amount of ascites, presumably related to the small bowel inflammation.  3.  Lung base reticular opacities most likely atelectasis.  4. Colonic diverticula without diverticulitis. Status post hysterectomy.  5.  Degenerative changes of the visualized spine.   Electronically Signed   By: Amie Portland   On: 05/30/2013  12:46    Microbiology: Recent Results (from the past 240 hour(s))  CULTURE, BLOOD (ROUTINE X 2)     Status: None   Collection Time    05/30/13  7:10 PM      Result Value Range Status   Specimen Description BLOOD ARM LEFT   Final   Special Requests BOTTLES DRAWN AEROBIC ONLY 10CC   Final   Culture  Setup Time     Final   Value: 05/31/2013 04:52     Performed at [facility]   Culture     Final   Value:        BLOOD CULTURE RECEIVED NO GROWTH TO DATE CULTURE WILL BE HELD FOR 5 DAYS BEFORE ISSUING A FINAL NEGATIVE REPORT     Performed at [facility]   Report Status PENDING   Incomplete  CULTURE, BLOOD (ROUTINE X 2)     Status: None   Collection Time    05/30/13  7:15 PM      Result Value Range Status  Specimen Description BLOOD ARM RIGHT   Final   Special Requests BOTTLES DRAWN AEROBIC AND ANAEROBIC 10CC   Final   Culture  Setup Time     Final   Value: 05/31/2013 04:53     Performed at [facility]   Culture     Final   Value:        BLOOD CULTURE RECEIVED NO GROWTH TO DATE CULTURE WILL BE HELD FOR 5 DAYS BEFORE ISSUING A FINAL NEGATIVE REPORT     Performed at [facility]   Report Status PENDING   Incomplete  CLOSTRIDIUM DIFFICILE BY PCR     Status: None   Collection Time    05/31/13  8:51 AM      Result Value Range Status   C difficile by pcr NEGATIVE  NEGATIVE Final     Labs: Basic Metabolic Panel:  Recent Labs Lab 05/30/13 0818 05/30/13 1830 05/31/13 0530 06/01/13 0500 06/02/13 0445  NA 135  --  130* 129* 132*  K 3.9  --  3.8 3.4* 3.4*  CL 96  --  97 97 98  CO2 23  --  23 20 27   GLUCOSE 145*  --  127* 101* 99  BUN 19  --  12 8 5*  CREATININE 0.77 0.72 0.74 0.67 0.61  CALCIUM 9.9  --  8.3* 8.4 8.0*   Liver Function Tests:  Recent Labs Lab 05/30/13 0818 05/31/13 0530  AST 25 18  ALT 17 11  ALKPHOS 65 58  BILITOT 0.4 0.5  PROT 7.4 5.8*  ALBUMIN 3.9 2.7*    Recent Labs Lab 05/30/13 0818  LIPASE 41   No results  found for this basename: AMMONIA,  in the last 168 hours CBC:  Recent Labs Lab 05/30/13 0818 05/30/13 1830 05/31/13 0530 06/01/13 1040 06/02/13 0445  WBC 18.9* 20.6* 15.3* 10.8* 9.7  NEUTROABS 17.1*  --  13.6*  --   --   HGB 13.7 11.1* 10.9* 10.6* 10.3*  HCT 40.8 32.3* 32.1* 31.5* 29.3*  MCV 95.6 93.6 94.1 93.8 91.0  PLT 179 192 180 168 181   Cardiac Enzymes:  Recent Labs Lab 05/30/13 1813 05/30/13 2316 05/31/13 0530  TROPONINI <0.30 <0.30 <0.30   BNP: BNP (last 3 results) No results found for this basename: PROBNP,  in the last 8760 hours CBG:  Recent Labs Lab 05/30/13 1825 05/30/13 2210 05/31/13 0759 05/31/13 2236 06/01/13 0806  GLUCAP 133* 115* 121* 111* 117*    Signed:  Larry Knipp L  [facility] 06/02/2013, 8:29 AM

## 2013-06-02 NOTE — Progress Notes (Signed)
Discharge home. Home discharge instruction given, no question verbalized. 

## 2013-06-04 ENCOUNTER — Ambulatory Visit (INDEPENDENT_AMBULATORY_CARE_PROVIDER_SITE_OTHER): Payer: Medicare Other | Admitting: Internal Medicine

## 2013-06-04 ENCOUNTER — Encounter: Payer: Self-pay | Admitting: Internal Medicine

## 2013-06-04 VITALS — BP 181/76 | HR 78 | Temp 98.3°F | Wt 121.6 lb

## 2013-06-04 DIAGNOSIS — R109 Unspecified abdominal pain: Secondary | ICD-10-CM

## 2013-06-04 DIAGNOSIS — I809 Phlebitis and thrombophlebitis of unspecified site: Secondary | ICD-10-CM

## 2013-06-04 DIAGNOSIS — R030 Elevated blood-pressure reading, without diagnosis of hypertension: Secondary | ICD-10-CM

## 2013-06-04 MED ORDER — DOXYCYCLINE HYCLATE 100 MG PO TABS: 100.0000 mg | ORAL_TABLET | Freq: Two times a day (BID) | ORAL | Status: DC

## 2013-06-04 NOTE — Patient Instructions (Signed)
Take the antibiotics for one week Apply a warm compress to the left arm twice a day Call anytime or go to the ER if the infection is spreading, you have fever, chills or if you are  getting worse. Blood pressure is elevated today--->  Check the  blood pressure 2 or 3 times a week, be sure it is between 110/60 and 140/85. If it is consistently higher or lower, let me know Please schedule a visit with her primary doctor in 10 days

## 2013-06-04 NOTE — Progress Notes (Signed)
  Subjective:    Patient ID: Jessica Sherman, female    DOB: September 14, 1930, 79 y.o.   MRN: [id number]  HPI Acute visit Was admitted to the hospital 05/30/2013 discharged 2 days ago. She had abdominal pain, she received IV antibiotics and was discharged on Cipro which she will finish in 2 days. Today however she noticed redness and some discomfort in the left arm and is concerned about it. Chart is reviewed, see below.  Past Medical History  Diagnosis Date  . Osteopenia     spine BMD T score - 1.69  . Hyperlipidemia   . Hypothyroidism   . Hx of colonic polyps   . Macular degeneration, dry   . Prediabetes     FBS 136  on 08/29/2010; A1c 6.2%  . Vertigo    Past Surgical History  Procedure Laterality Date  . Total abdominal hysterectomy      w/ bladder repair  . Tonsillectomy and adenoidectomy    . Pilonidal cyst excision      X 3  . Cataract extraction  2009    OS  . Colonoscopy w/ polypectomy      X 2; last 2012. Dr Ewing Schlein     Review of Systems The abdominal pain is resolved. No nausea, vomiting, diarrhea. No fever or chills. Chart is reviewed:  Admit to the hospital with abdominal pain, CT was abnormal:  1. Mild wall thickening and adjacent inflammation of a loop of  jejunum in the left mid to upper abdomen. This is nonspecific and  may reflect an infectious or inflammatory enteritis. It is  consistent with this patient's symptoms.  2. Small amount of ascites, presumably related to the small bowel  inflammation.  3. Lung base reticular opacities most likely atelectasis.  4. Colonic diverticula without diverticulitis. Status post  hysterectomy.  5. Degenerative changes of the visualized spine.   Labs: Last CBC showed a trending down white blood cells, hemoglobin 10.3, sodium 132, potassium 3.4. Blood cultures were negative C. Difficile was negative.       Objective:   Physical Exam  Skin:      BP 181/76  Pulse 78  Temp(Src) 98.3 F (36.8 C)  Wt 121 lb  9.6 oz (55.157 kg)  BMI 21.55 kg/m2  SpO2 98% General -- alert, well-developed, NAD.   Lungs -- normal respiratory effort, no intercostal retractions, no accessory muscle use, and normal breath sounds.  Heart-- normal rate, regular rhythm, no murmur.  Abdomen-- Not distended, good bowel sounds,soft, non-tender. Extremities-- no pretibial edema bilaterally  Neurologic--  alert & oriented X3. Speech normal, gait normal, strength normal in all extremities.   Psych-- Cognition and judgment appear intact. Cooperative with normal attention span and concentration. No anxious appearing , no depressed appearing.        Assessment & Plan:   Phlebitis-cellulitis: Rx  doxycycline, warm compress, patient to come back in 10 days, call sooner if worse. See instructions  Abdominal pain, Improving, I asked her to please to take Cipro today and tomorrow and then stop. Needs followup for the hospital admission, recommend to come back in 10 days to see PCP  BP elevated today, recommend self monitoring

## 2013-06-05 ENCOUNTER — Encounter: Payer: Self-pay | Admitting: Internal Medicine

## 2013-06-06 LAB — CULTURE, BLOOD (ROUTINE X 2): Culture: NO GROWTH

## 2013-06-16 ENCOUNTER — Encounter: Payer: Self-pay | Admitting: Internal Medicine

## 2013-06-16 ENCOUNTER — Ambulatory Visit (INDEPENDENT_AMBULATORY_CARE_PROVIDER_SITE_OTHER): Payer: Medicare Other | Admitting: Internal Medicine

## 2013-06-16 VITALS — BP 170/80 | HR 70 | Temp 97.9°F | Resp 12 | Wt 114.4 lb

## 2013-06-16 DIAGNOSIS — I10 Essential (primary) hypertension: Secondary | ICD-10-CM

## 2013-06-16 DIAGNOSIS — K529 Noninfective gastroenteritis and colitis, unspecified: Secondary | ICD-10-CM

## 2013-06-16 DIAGNOSIS — I82612 Acute embolism and thrombosis of superficial veins of left upper extremity: Secondary | ICD-10-CM

## 2013-06-16 DIAGNOSIS — K5289 Other specified noninfective gastroenteritis and colitis: Secondary | ICD-10-CM

## 2013-06-16 DIAGNOSIS — I82619 Acute embolism and thrombosis of superficial veins of unspecified upper extremity: Secondary | ICD-10-CM

## 2013-06-16 MED ORDER — METOPROLOL TARTRATE 25 MG PO TABS: ORAL_TABLET | ORAL | Status: DC

## 2013-06-16 NOTE — Progress Notes (Signed)
  Subjective:    Patient ID: Jessica Sherman, female    DOB: June 08, 1931, 79 y.o.   MRN: [id number]  HPI  Her complicated Hospital course for enteritis was reviewed and summarized in the problem list. Hospitalization was complicated by phlebitis of the left forearm. She has completed 10 days of doxycycline with significant clinical response.    Review of Systems  At this time she specifically denies fever, chills, sweats, or weight loss. She has no abdominal pain, nausea, vomiting, melena, rectal bleeding, or diarrhea. The extensive microbiologic stool studies were reviewed; all were negative.  The forearm phlebitis has improved significantly with only residual erythema without associated pain.  Her daughter is Nurse & can check her BP. By history she has white cost HTN     Objective:   Physical Exam  Gen.: Thin but healthy and well-nourished in appearance. Alert, appropriate and cooperative throughout exam.Appears younger than stated age   Eyes: No corneal or conjunctival inflammation noted. No icterus. Nose: External nasal exam reveals no deformity or inflammation. Nasal mucosa are pink and moist. No lesions or exudates noted.   Mouth: Oral mucosa and oropharynx reveal no lesions or exudates. Teeth in good repair. Neck: No deformities, masses, or tenderness noted.  Lungs: Normal respiratory effort; chest expands symmetrically. Lungs are clear to auscultation without rales, wheezes, or increased work of breathing. Heart: Normal rate and rhythm. Normal S1 and S2. No gallop, click, or rub. No murmur. Abdomen: Bowel sounds normal; abdomen soft and nontender. No masses, organomegaly or hernias noted.                                  Musculoskeletal/extremities:  No clubbing, cyanosis, or edema noted.Tone & strength  Normal. Joints  reveal mixed PIP/ DIP changes. Nail health good. Able to lie down & sit up w/o help.  Vascular: Carotid, radial artery, dorsalis pedis and  posterior tibial  pulses are full and equal. No bruits present. Neurologic: Alert and oriented x3.          Skin: Intact without suspicious lesions or rashes. Mild, nontender erythema around superficial venous thrombosis 7 x 0.5 cm left forearm. Lymph: No cervical, axillary, or epitrochlear lymphadenopathy present. Psych: Mood and affect are normal. Normally interactive                                                                                        Assessment & Plan:  #1 superficial, bland venous thrombosis left forearm  #2 complicated enteritis, clinically resolved #3 HTN, white coat syndrome by history  Plan: See orders

## 2013-06-16 NOTE — Patient Instructions (Addendum)
Minimal Blood Pressure Goal= AVERAGE < 140/90;  Ideal is an AVERAGE < 135/85. This AVERAGE should be calculated from @ least 5-7 BP readings taken @ different times of day on different days of week. You should not respond to isolated BP readings , but rather the AVERAGE for that week .Please bring your  blood pressure cuff to office visits to verify that it is reliable.It  can also be checked against the blood pressure device at the pharmacy. Finger or wrist cuffs are not dependable; an arm cuff is. Use warm moist compresses to 3 times a day to the affected area.Please report warning signs as we discussed. Worrisome would be fever & pain.

## 2013-07-08 ENCOUNTER — Ambulatory Visit (INDEPENDENT_AMBULATORY_CARE_PROVIDER_SITE_OTHER): Payer: Medicare Other | Admitting: General Practice

## 2013-07-08 DIAGNOSIS — Z23 Encounter for immunization: Secondary | ICD-10-CM

## 2013-07-31 ENCOUNTER — Ambulatory Visit (INDEPENDENT_AMBULATORY_CARE_PROVIDER_SITE_OTHER): Payer: Medicare Other | Admitting: *Deleted

## 2013-07-31 DIAGNOSIS — Z23 Encounter for immunization: Secondary | ICD-10-CM

## 2014-01-14 ENCOUNTER — Other Ambulatory Visit: Payer: Self-pay | Admitting: Internal Medicine

## 2014-03-21 ENCOUNTER — Other Ambulatory Visit: Payer: Self-pay | Admitting: Internal Medicine

## 2014-04-02 ENCOUNTER — Encounter: Payer: Self-pay | Admitting: Internal Medicine

## 2014-06-22 ENCOUNTER — Other Ambulatory Visit: Payer: Self-pay | Admitting: Internal Medicine

## 2014-09-06 ENCOUNTER — Other Ambulatory Visit: Payer: Self-pay | Admitting: Internal Medicine

## 2014-09-15 ENCOUNTER — Ambulatory Visit (INDEPENDENT_AMBULATORY_CARE_PROVIDER_SITE_OTHER): Payer: PPO | Admitting: Internal Medicine

## 2014-09-15 ENCOUNTER — Other Ambulatory Visit (INDEPENDENT_AMBULATORY_CARE_PROVIDER_SITE_OTHER): Payer: PPO

## 2014-09-15 ENCOUNTER — Other Ambulatory Visit: Payer: Self-pay | Admitting: Internal Medicine

## 2014-09-15 ENCOUNTER — Encounter: Payer: Self-pay | Admitting: Internal Medicine

## 2014-09-15 VITALS — BP 168/86 | HR 70 | Temp 97.9°F | Ht 62.0 in | Wt 115.2 lb

## 2014-09-15 DIAGNOSIS — E782 Mixed hyperlipidemia: Secondary | ICD-10-CM

## 2014-09-15 DIAGNOSIS — R7309 Other abnormal glucose: Secondary | ICD-10-CM

## 2014-09-15 DIAGNOSIS — D649 Anemia, unspecified: Secondary | ICD-10-CM

## 2014-09-15 DIAGNOSIS — E559 Vitamin D deficiency, unspecified: Secondary | ICD-10-CM | POA: Diagnosis not present

## 2014-09-15 DIAGNOSIS — E039 Hypothyroidism, unspecified: Secondary | ICD-10-CM | POA: Diagnosis not present

## 2014-09-15 DIAGNOSIS — R7303 Prediabetes: Secondary | ICD-10-CM

## 2014-09-15 LAB — HEMOGLOBIN A1C: HEMOGLOBIN A1C: 6.3 % (ref 4.6–6.5)

## 2014-09-15 LAB — BASIC METABOLIC PANEL
BUN: 23 mg/dL (ref 6–23)
CO2: 26 meq/L (ref 19–32)
CREATININE: 0.8 mg/dL (ref 0.4–1.2)
Calcium: 10.2 mg/dL (ref 8.4–10.5)
Chloride: 102 mEq/L (ref 96–112)
GFR: 68.99 mL/min (ref >60.00)
Glucose, Bld: 103 mg/dL — ABNORMAL HIGH (ref 70–99)
Potassium: 4.3 mEq/L (ref 3.5–5.1)
Sodium: 138 mEq/L (ref 135–145)

## 2014-09-15 LAB — TSH: TSH: 2.78 u[IU]/mL (ref 0.35–4.50)

## 2014-09-15 LAB — HEPATIC FUNCTION PANEL
ALBUMIN: 4.3 g/dL (ref 3.5–5.2)
ALT: 15 U/L (ref 0–35)
AST: 22 U/L (ref 0–37)
Alkaline Phosphatase: 64 U/L (ref 39–117)
BILIRUBIN TOTAL: 0.7 mg/dL (ref 0.2–1.2)
Bilirubin, Direct: 0.1 mg/dL (ref 0.0–0.3)
Total Protein: 7.8 g/dL (ref 6.0–8.3)

## 2014-09-15 LAB — CBC WITH DIFFERENTIAL/PLATELET
Basophils Absolute: 0 10*3/uL (ref 0.0–0.1)
Basophils Relative: 0.6 % (ref 0.0–3.0)
Eosinophils Absolute: 0.1 10*3/uL (ref 0.0–0.7)
Eosinophils Relative: 1.1 % (ref 0.0–5.0)
HCT: 35.8 % — ABNORMAL LOW (ref 36.0–46.0)
Hemoglobin: 12.8 g/dL (ref 12.0–15.0)
Lymphocytes Relative: 29 % (ref 12.0–46.0)
Lymphs Abs: 2.1 10*3/uL (ref 0.7–4.0)
MCHC: 35.7 g/dL (ref 30.0–36.0)
MCV: 97.1 fl (ref 78.0–100.0)
Monocytes Absolute: 0.6 10*3/uL (ref 0.1–1.0)
Monocytes Relative: 7.8 % (ref 3.0–12.0)
Neutro Abs: 4.4 10*3/uL (ref 1.4–7.7)
Neutrophils Relative %: 61.5 % (ref 43.0–77.0)
Platelets: 338 10*3/uL (ref 150.0–400.0)
RBC: 3.68 Mil/uL — ABNORMAL LOW (ref 3.87–5.11)
RDW: 12.9 % (ref 11.5–15.5)
WBC: 7.1 10*3/uL (ref 4.0–10.5)

## 2014-09-15 LAB — IBC PANEL
IRON: 86 ug/dL (ref 42–145)
Saturation Ratios: 19.1 % — ABNORMAL LOW (ref 20.0–50.0)
TRANSFERRIN: 321.8 mg/dL (ref 212.0–360.0)

## 2014-09-15 LAB — VITAMIN D 25 HYDROXY (VIT D DEFICIENCY, FRACTURES): VITD: 35.8 ng/mL (ref 30.00–100.00)

## 2014-09-15 NOTE — Assessment & Plan Note (Signed)
Vitamin D level 

## 2014-09-15 NOTE — Assessment & Plan Note (Signed)
TSH Note: off thyroid 1 week

## 2014-09-15 NOTE — Patient Instructions (Signed)

## 2014-09-15 NOTE — Assessment & Plan Note (Signed)
NMR Lipoprofile, LFTs, TSH  

## 2014-09-15 NOTE — Assessment & Plan Note (Signed)
A1c

## 2014-09-15 NOTE — Assessment & Plan Note (Addendum)
CBC Iron panel

## 2014-09-15 NOTE — Progress Notes (Signed)
Pre visit review using our clinic review tool, if applicable. No additional management support is needed unless otherwise documented below in the visit note. 

## 2014-09-15 NOTE — Progress Notes (Signed)
   Subjective:    Patient ID: Jessica Sherman, female    DOB: September 04, 1931, 81 y.o.   MRN: [id number]  HPI   She has been compliant with her statin ; but she has been out of thyroid 1 week & is not taking the BP med, metoprolol. She states her blood pressures are well controlled as measured by her daughter. She is unable to give me exact numbers.  She is on a heart healthy, low-salt diet. She has decreased her formal exercise program but does climb steps repeatedly through the day without symptoms    Review of Systems   No significant change in weight; significant fatigue; sleep disorder; change in appetite. No blurred, double vision ,loss of vision No constipation; diarrhea;hoarseness;dysphagia No change in nails,hair,skin No numbness or tingling; tremor No anxiety; depression; panic attacks No temperature intolerance to heat ,cold  Significant headaches, epistaxis, chest pain, palpitations, exertional dyspnea, claudication, paroxysmal nocturnal dyspnea, or edema absent. No GI symptoms , memory loss or myalgias      Objective:   Physical Exam  Pertinent or positive findings include: She is thin but adequately nourished She appears much younger than her stated age Repeat blood pressure was 145/85. Aorta is palpable but there is no aneurysm. She has mixed DIP and PIP change in the hands  General appearance :in no distress. Eyes: No conjunctival inflammation or scleral icterus is present. Oral exam: Dental hygiene is good. Lips and gums are healthy appearing.There is no oropharyngeal erythema or exudate noted.  Heart:  Normal rate and regular rhythm. S1 and S2 normal without gallop, murmur, click, rub or other extra sounds   Lungs:Chest clear to auscultation; no wheezes, rhonchi,rales ,or rubs present.No increased work of breathing.  Abdomen: bowel sounds normal, soft and non-tender without masses, organomegaly or hernias noted.  No guarding or rebound.  Vascular : all pulses  equal ; no bruits present. Skin:Warm & dry.  Intact without suspicious lesions or rashes ; no jaundice or tenting Lymphatic: No lymphadenopathy is noted about the head, neck, axilla          Assessment & Plan:  See Current Assessment & Plan in Problem List under specific Diagnosis

## 2014-09-16 ENCOUNTER — Telehealth: Payer: Self-pay

## 2014-09-16 MED ORDER — LEVOTHYROXINE SODIUM 88 MCG PO TABS: ORAL_TABLET | ORAL | Status: DC

## 2014-09-16 NOTE — Telephone Encounter (Signed)
-----   Message from Hendricks Limes, MD sent at 09/16/2014  6:33 AM EST ----- Please renew thyroid X 1 year

## 2014-09-16 NOTE — Telephone Encounter (Signed)
Done

## 2014-09-17 LAB — NMR LIPOPROFILE WITH LIPIDS
CHOLESTEROL, TOTAL: 284 mg/dL — AB (ref 100–199)
HDL Particle Number: 41.8 umol/L (ref >=30.5)
HDL Size: 9.1 nm — ABNORMAL LOW (ref >=9.2)
HDL-C: 65 mg/dL (ref >39)
LARGE HDL: 7.8 umol/L (ref >=4.8)
LARGE VLDL-P: 2.4 nmol/L (ref <=2.7)
LDL (calc): 184 mg/dL — ABNORMAL HIGH (ref 0–99)
LDL PARTICLE NUMBER: 2479 nmol/L — AB (ref <1000)
LDL Size: 20.9 nm (ref >=20.8)
LP-IR Score: 41 (ref <=45)
SMALL LDL PARTICLE NUMBER: 1357 nmol/L — AB (ref <=527)
Triglycerides: 175 mg/dL — ABNORMAL HIGH (ref 0–149)
VLDL Size: 43.9 nm (ref <=46.6)

## 2014-11-20 ENCOUNTER — Telehealth: Payer: Self-pay

## 2014-11-23 NOTE — Telephone Encounter (Signed)
Call placed to confirm flu vaccine and confirmed receipt on 06/2014 at [facility].

## 2015-02-04 ENCOUNTER — Other Ambulatory Visit: Payer: Self-pay | Admitting: Internal Medicine

## 2015-03-16 LAB — HM DEXA SCAN

## 2015-03-17 ENCOUNTER — Encounter: Payer: Self-pay | Admitting: Internal Medicine

## 2015-03-17 ENCOUNTER — Telehealth: Payer: Self-pay | Admitting: Internal Medicine

## 2015-03-17 ENCOUNTER — Other Ambulatory Visit: Payer: Self-pay | Admitting: Emergency Medicine

## 2015-03-17 MED ORDER — MECLIZINE HCL 25 MG PO TABS: ORAL_TABLET | ORAL | Status: DC

## 2015-03-17 NOTE — Telephone Encounter (Signed)
RX sent in for antivert

## 2015-03-17 NOTE — Telephone Encounter (Signed)
Patient need a refill of Antivert 25 mg

## 2015-03-17 NOTE — Telephone Encounter (Signed)
1/2 tid prn # 15

## 2015-03-17 NOTE — Telephone Encounter (Signed)
Please advise, I dont see Antivert on pts med list.

## 2015-04-05 LAB — HM MAMMOGRAPHY

## 2015-04-05 LAB — HM DEXA SCAN

## 2015-04-12 ENCOUNTER — Telehealth: Payer: Self-pay | Admitting: Emergency Medicine

## 2015-04-12 ENCOUNTER — Encounter: Payer: Self-pay | Admitting: Internal Medicine

## 2015-04-12 NOTE — Telephone Encounter (Signed)
LVM for pt to call back, a follow-up from [facility]. Is needed with either Dr Linna Darner or Dr Ulanda Edison to discuss Osteoporosis

## 2015-04-15 ENCOUNTER — Encounter: Payer: Self-pay | Admitting: Internal Medicine

## 2015-05-03 ENCOUNTER — Encounter: Payer: Self-pay | Admitting: Internal Medicine

## 2015-05-03 ENCOUNTER — Ambulatory Visit (INDEPENDENT_AMBULATORY_CARE_PROVIDER_SITE_OTHER): Payer: PPO | Admitting: Internal Medicine

## 2015-05-03 ENCOUNTER — Other Ambulatory Visit (INDEPENDENT_AMBULATORY_CARE_PROVIDER_SITE_OTHER): Payer: PPO

## 2015-05-03 VITALS — BP 140/86 | HR 71 | Temp 98.1°F | Resp 16 | Wt 118.0 lb

## 2015-05-03 DIAGNOSIS — M8580 Other specified disorders of bone density and structure, unspecified site: Secondary | ICD-10-CM | POA: Diagnosis not present

## 2015-05-03 DIAGNOSIS — E559 Vitamin D deficiency, unspecified: Secondary | ICD-10-CM | POA: Diagnosis not present

## 2015-05-03 LAB — VITAMIN D 25 HYDROXY (VIT D DEFICIENCY, FRACTURES): VITD: 42.71 ng/mL (ref 30.00–100.00)

## 2015-05-03 NOTE — Assessment & Plan Note (Signed)
Bone density will be monitored every 25 months. She will continue her excellent exercise program but will exert caution and care to prevent falls because of the increased fracture risk as noted.  She does not wish to start a bisphosphonate. She does not qualify for the parenteral agents in the absence of frank osteoporosis.

## 2015-05-03 NOTE — Patient Instructions (Signed)
Please continue the excellent exercise program; exert extreme care to avoid any falls because of the increased fracture risk as we discussed.  Bone density should be repeated every 25 months.

## 2015-05-03 NOTE — Progress Notes (Signed)
   Subjective:    Patient ID: Jessica Sherman, female    DOB: 1931-05-05, 81 y.o.   MRN: [id number]  HPI   She is here to review her bone density results. These were discussed in detail and a copy provided to her. Although the osteopenia is stable with a T score of -2.0 at the spine; she has a significant fracture risk. This is 11.2% at the hip and 19.6% in the spine.  She does take vitamin D 1000 IU daily. Her last vitamin D level was 10/10/11; it was 73. She ingest at least 1200 units of calcium daily.  She is walking a mile a day without associated cardiopulmonary symptoms.    Review of Systems   Chest pain, palpitations, tachycardia, exertional dyspnea, paroxysmal nocturnal dyspnea, claudication or edema are absent.       Objective:   Physical Exam  Pertinent or positive findings include: She appears much younger than stated age. She has mixed DIP/PIP changes. The fusiform changes are most pronounced proximally. She has slight crepitus of knees. Dorsalis pedis pulses are decreased General appearance :adequately nourished; in no distress.  Eyes: No conjunctival inflammation or scleral icterus is present.  Heart:  Normal rate and regular rhythm. S1 and S2 normal without gallop, murmur, click, rub or other extra sounds    Lungs:Chest clear to auscultation; no wheezes, rhonchi,rales ,or rubs present.No increased work of breathing.    Vascular : all pulses equal ; no bruits present.  Skin:Warm & dry.  Intact without suspicious lesions or rashes ; no tenting or jaundice   Lymphatic: No lymphadenopathy is noted about the head, neck, axilla  Neuro: Strength, tone & DTRs normal.         Assessment & Plan:  See Current Assessment & Plan in Problem List under specific Diagnosis

## 2015-05-03 NOTE — Assessment & Plan Note (Signed)
Vitamin D level 

## 2015-05-03 NOTE — Progress Notes (Signed)
Pre visit review using our clinic review tool, if applicable. No additional management support is needed unless otherwise documented below in the visit note. 

## 2015-08-24 ENCOUNTER — Telehealth: Payer: Self-pay

## 2015-08-24 NOTE — Telephone Encounter (Signed)
Left Voice Mail for pt to call back.   RE: Flu Vaccine for 2016  

## 2015-10-08 ENCOUNTER — Ambulatory Visit (INDEPENDENT_AMBULATORY_CARE_PROVIDER_SITE_OTHER): Payer: PPO | Admitting: Internal Medicine

## 2015-10-08 ENCOUNTER — Encounter: Payer: Self-pay | Admitting: Internal Medicine

## 2015-10-08 VITALS — BP 130/80 | HR 70 | Temp 98.2°F | Ht 62.0 in | Wt 120.0 lb

## 2015-10-08 DIAGNOSIS — Z23 Encounter for immunization: Secondary | ICD-10-CM

## 2015-10-08 DIAGNOSIS — D649 Anemia, unspecified: Secondary | ICD-10-CM

## 2015-10-08 DIAGNOSIS — R7303 Prediabetes: Secondary | ICD-10-CM | POA: Diagnosis not present

## 2015-10-08 DIAGNOSIS — M8580 Other specified disorders of bone density and structure, unspecified site: Secondary | ICD-10-CM

## 2015-10-08 DIAGNOSIS — E039 Hypothyroidism, unspecified: Secondary | ICD-10-CM | POA: Diagnosis not present

## 2015-10-08 DIAGNOSIS — E782 Mixed hyperlipidemia: Secondary | ICD-10-CM

## 2015-10-08 DIAGNOSIS — Z0000 Encounter for general adult medical examination without abnormal findings: Secondary | ICD-10-CM

## 2015-10-08 NOTE — Progress Notes (Signed)
Subjective:    Patient ID: Jessica Sherman, female    DOB: [birth year], 82 y.o.   MRN: [id number]  HPI He is here to establish with a new pcp.   He is here for a wellness/physical.   She has no concerns.  Here for medicare wellness.   I have personally reviewed and have noted 1.The patient's medical and social history 2.Their use of alcohol, tobacco or illicit drugs 3.Their current medications and supplements 4.The patient's functional ability including ADL's, fall risks, home safety risks and                 hearing or visual impairment. 5.Diet and physical activities 6.Evidence for depression or mood disorders 7.Care team reviewed and updated (available in snapshot)   Are there smokers in your home (other than you)? No  Risk Factors Exercise: regular exercise Dietary issues discussed: mediterranean diet  Cardiac risk factors: advanced age (older than 48 for men, 77 for women), hypertension, hyperlipidemia  Depression Screen  Have you felt down, depressed or hopeless? No  Have you felt little interest or pleasure in doing things?  No Activities of Daily Living In your present state of health, do you have any difficulty performing the following activities?:  Driving? Yes Managing money?  Yes Feeding yourself? Yes Getting from bed to chair? Yes Climbing a flight of stairs? Yes Preparing food and eating?: Yes Bathing or showering? Yes Getting dressed: Yes Getting to/using the toilet? Yes Moving around from place to place: Yes In the past year have you fallen or had a near fall?: No   Are you sexually active?  No  Do you have more than one partner?  N/A  Hearing Difficulties: No Do you often ask people to speak up or repeat themselves? No Do you experience ringing or noises in your ears? No Do you have difficulty understanding soft or whispered voices? No Vision:              Any change in vision:  No, macular degeneration - chronic             Up to date with eye exam: yes Memory:  Do you feel that you have a problem with memory? No  Do you often misplace items? No  Do you feel safe at home?  Yes  Cognitive Testing  Alert, Orientated? Yes  Normal Appearance? Yes  Recall of three objects?  Yes  Can perform simple calculations? Yes  Displays appropriate judgment? Yes  Can read the correct time from a watch face? Yes   Advanced Directives have been discussed with the patient? Yes, in place    Medications and allergies reviewed with patient and updated if appropriate.  Patient Active Problem List   Diagnosis Date Noted  . Enteritis 05/30/2013  . Prediabetes   . Macular degeneration, dry   . Vertigo   . Anemia 09/02/2010  . Vitamin D deficiency 11/17/2008  . COLONIC POLYPS, HX OF 11/17/2008  . Hypothyroidism 11/19/2007  . HYPERLIPIDEMIA 11/19/2007  . Osteopenia 02/18/2007    Current Outpatient Prescriptions on File Prior to Visit  Medication Sig Dispense Refill  . Calcium Carbonate (CALTRATE 600 PO) Take 600 mg by mouth 2 (two) times daily.     . Cholecalciferol (VITAMIN D3) 1000 UNITS CAPS Take 1,000 Units by mouth daily.     Marland Kitchen levothyroxine (SYNTHROID, LEVOTHROID) 88 MCG tablet TAKE 1/2 TABLET BY MOUTH ON WEDNESDAY AND 1 TABLET DAILY ALL OTHER DAYS 30 tablet 11  .  meclizine (ANTIVERT) 25 MG tablet Take 1/2 tablet 3 times a day as needed 15 tablet 0  . metoprolol tartrate (LOPRESSOR) 25 MG tablet 1 bid if BP averages > 140/90 on average (Patient not taking: Reported on 10/08/2015) 60 tablet 2  . Multiple Vitamins-Minerals (CENTRUM SILVER PO) Take 1 tablet by mouth daily.     . Multiple Vitamins-Minerals (PRESERVISION/LUTEIN) CAPS Take 1 capsule by mouth 2 (two) times daily.     . simvastatin (ZOCOR) 40 MG tablet Take 20 mg by mouth every evening.     No current facility-administered medications on file prior to visit.    Past Medical History  Diagnosis Date  .  Osteopenia     spine BMD T score - 1.69  . Hyperlipidemia   . Hypothyroidism   . Hx of colonic polyps   . Macular degeneration, dry   . Prediabetes     FBS 136  on 08/29/2010; A1c 6.2%  . Vertigo     Past Surgical History  Procedure Laterality Date  . Total abdominal hysterectomy      w/ bladder repair  . Tonsillectomy and adenoidectomy    . Pilonidal cyst excision      X 3  . Cataract extraction  2009    OS  . Colonoscopy w/ polypectomy      X 2; last 2012. Dr Watt Climes    Social History   Social History  . Marital Status: Married    Spouse Name: N/A  . Number of Children: N/A  . Years of Education: N/A   Occupational History  . retired    Social History Main Topics  . Smoking status: Never Smoker   . Smokeless tobacco: None  . Alcohol Use: No     Comment:    . Drug Use: No  . Sexual Activity: Not Asked   Other Topics Concern  . None   Social History Narrative   Regular exercise- yes 33mpd w/o symptoms    Family History  Problem Relation Age of Onset  . Osteoporosis Mother   . Leukemia Mother   . Cancer Maternal Uncle     ? stomach  . Heart attack Neg Hx   . Aneurysm Father     AAA    Review of Systems  Constitutional: Negative for fever, chills, appetite change and unexpected weight change.  HENT: Negative for hearing loss and tinnitus.   Eyes: Negative for visual disturbance.  Respiratory: Negative for cough, shortness of breath and wheezing.   Cardiovascular: Negative for chest pain, palpitations and leg swelling.  Gastrointestinal: Negative for nausea, diarrhea, constipation, blood in stool and anal bleeding.       No GERD  Genitourinary: Negative for dysuria and hematuria.  Musculoskeletal: Negative for back pain and arthralgias.  Skin: Negative for rash.  Neurological: Positive for dizziness (occasional). Negative for weakness, light-headedness, numbness and headaches.  Psychiatric/Behavioral: Negative for dysphoric mood. The patient is not  nervous/anxious.        Objective:   Filed Vitals:   10/08/15 1323  BP: 130/80  Pulse: 70  Temp: 98.2 F (36.8 C)   Filed Weights   10/08/15 1323  Weight: 120 lb (54.432 kg)   Body mass index is 21.94 kg/(m^2).   Physical Exam Constitutional: She appears well-developed and well-nourished. No distress.  HENT:  Head: Normocephalic and atraumatic.  Right Ear: External ear normal. Normal ear canal and TM Left Ear: External ear normal.  Normal ear canal and TM Mouth/Throat: Oropharynx is clear and  moist.  Normal bilateral ear canals and tympanic membranes  Eyes: Conjunctivae and EOM are normal.  Neck: Neck supple. No tracheal deviation present. No thyromegaly present.  No carotid bruit  Cardiovascular: Normal rate, regular rhythm and normal heart sounds.   No murmur heard.  No edema. Pulmonary/Chest: Effort normal and breath sounds normal. No respiratory distress. She has no wheezes. She has no rales.  Breast: deferred to Gyn Abdominal: Soft. She exhibits no distension. There is no tenderness.  Lymphadenopathy: She has no cervical adenopathy.  Skin: Skin is warm and dry. She is not diaphoretic.  Psychiatric: She has a normal mood and affect. Her behavior is normal.       Assessment & Plan:   Wellness Exam Immunizations up to date - has had shingles vaccine EKG deferred today Colonoscopy -  Not needed based on age 10 up to date dexa up to date 56  - not needed Eye exam - up to date Hearing loss none Memory concerns/difficulties - none Independent of ADLs - fully independent and son lives next door Overall very healthy lifestyle - exercises regularly and eats a healthy diet.  All HM up to date.  No fall risk at this time Has advanced directives in place   Physical Exam: Screening blood work ordered mammo and dexa up to date EKG deferred Colonoscopy not needed based on age - last done 2009 GYn visit not needed Eye exams up to date Exercises  regularly Healthy diet No substance abuse No skin concerns No falls Reviewed chronic medical problems  See Problem List for Assessment and Plan of chronic medical problems.   Follow up annually

## 2015-10-08 NOTE — Patient Instructions (Addendum)
Jessica Sherman , Thank you for taking time to come for your Medicare Wellness Visit. I appreciate your ongoing commitment to your health goals. Please review the following plan we discussed and let me know if I can assist you in the future.   These are the goals we discussed: Goals    None      This is a list of the screening recommended for you and due dates:  Health Maintenance  Topic Date Due  . Shingles Vaccine  09/19/1992 - not due, done elsewhere  . Pneumonia vaccines (2 of 2 - PCV13) 07/31/2014 - will be done today   . Flu Shot  04/11/2016  . Tetanus Vaccine  01/08/2020  . DEXA scan (bone density measurement)  Completed      We have reviewed your prior records including labs and tests today.  Test(s) ordered today. Your results will be released to [place] (or called to you) after review, usually within 72hours after test completion. If any changes need to be made, you will be notified at that same time.  All other Health Maintenance issues reviewed.   All recommended immunizations and age-appropriate screenings are up-to-date.  prevnar vaccine administered today.   Medications reviewed and updated.  No changes recommended at this time.  Please followup annually, sooner if needed.   Health Maintenance, Female Adopting a healthy lifestyle and getting preventive care can go a long way to promote health and wellness. Talk with your health care provider about what schedule of regular examinations is right for you. This is a good chance for you to check in with your provider about disease prevention and staying healthy. In between checkups, there are plenty of things you can do on your own. Experts have done a lot of research about which lifestyle changes and preventive measures are most likely to keep you healthy. Ask your health care provider for more information. WEIGHT AND DIET  Eat a healthy diet  Be sure to include plenty of vegetables, fruits, low-fat dairy products,  and lean protein.  Do not eat a lot of foods high in solid fats, added sugars, or salt.  Get regular exercise. This is one of the most important things you can do for your health.  Most adults should exercise for at least 150 minutes each week. The exercise should increase your heart rate and make you sweat (moderate-intensity exercise).  Most adults should also do strengthening exercises at least twice a week. This is in addition to the moderate-intensity exercise.  Maintain a healthy weight  Body mass index (BMI) is a measurement that can be used to identify possible weight problems. It estimates body fat based on height and weight. Your health care provider can help determine your BMI and help you achieve or maintain a healthy weight.  For females 32 years of age and older:   A BMI below 18.5 is considered underweight.  A BMI of 18.5 to 24.9 is normal.  A BMI of 25 to 29.9 is considered overweight.  A BMI of 30 and above is considered obese.  Watch levels of cholesterol and blood lipids  You should start having your blood tested for lipids and cholesterol at 80 years of age, then have this test every 5 years.  You may need to have your cholesterol levels checked more often if:  Your lipid or cholesterol levels are high.  You are older than 80 years of age.  You are at high risk for heart disease.  CANCER SCREENING  Lung Cancer  Lung cancer screening is recommended for adults 74-57 years old who are at high risk for lung cancer because of a history of smoking.  A yearly low-dose CT scan of the lungs is recommended for people who:  Currently smoke.  Have quit within the past 15 years.  Have at least a 30-pack-year history of smoking. A pack year is smoking an average of one pack of cigarettes a day for 1 year.  Yearly screening should continue until it has been 15 years since you quit.  Yearly screening should stop if you develop a health problem that would  prevent you from having lung cancer treatment.  Breast Cancer  Practice breast self-awareness. This means understanding how your breasts normally appear and feel.  It also means doing regular breast self-exams. Let your health care provider know about any changes, no matter how small.  If you are in your 20s or 30s, you should have a clinical breast exam (CBE) by a health care provider every 1-3 years as part of a regular health exam.  If you are 65 or older, have a CBE every year. Also consider having a breast X-ray (mammogram) every year.  If you have a family history of breast cancer, talk to your health care provider about genetic screening.  If you are at high risk for breast cancer, talk to your health care provider about having an MRI and a mammogram every year.  Breast cancer gene (BRCA) assessment is recommended for women who have family members with BRCA-related cancers. BRCA-related cancers include:  Breast.  Ovarian.  Tubal.  Peritoneal cancers.  Results of the assessment will determine the need for genetic counseling and BRCA1 and BRCA2 testing. Cervical Cancer Your health care provider may recommend that you be screened regularly for cancer of the pelvic organs (ovaries, uterus, and vagina). This screening involves a pelvic examination, including checking for microscopic changes to the surface of your cervix (Pap test). You may be encouraged to have this screening done every 3 years, beginning at age 31.  For women ages 33-65, health care providers may recommend pelvic exams and Pap testing every 3 years, or they may recommend the Pap and pelvic exam, combined with testing for human papilloma virus (HPV), every 5 years. Some types of HPV increase your risk of cervical cancer. Testing for HPV may also be done on women of any age with unclear Pap test results.  Other health care providers may not recommend any screening for nonpregnant women who are considered low risk for  pelvic cancer and who do not have symptoms. Ask your health care provider if a screening pelvic exam is right for you.  If you have had past treatment for cervical cancer or a condition that could lead to cancer, you need Pap tests and screening for cancer for at least 20 years after your treatment. If Pap tests have been discontinued, your risk factors (such as having a new sexual partner) need to be reassessed to determine if screening should resume. Some women have medical problems that increase the chance of getting cervical cancer. In these cases, your health care provider may recommend more frequent screening and Pap tests. Colorectal Cancer  This type of cancer can be detected and often prevented.  Routine colorectal cancer screening usually begins at 80 years of age and continues through 80 years of age.  Your health care provider may recommend screening at an earlier age if you have risk factors for colon cancer.  Your health care provider may also recommend using home test kits to check for hidden blood in the stool.  A small camera at the end of a tube can be used to examine your colon directly (sigmoidoscopy or colonoscopy). This is done to check for the earliest forms of colorectal cancer.  Routine screening usually begins at age 55.  Direct examination of the colon should be repeated every 5-10 years through 80 years of age. However, you may need to be screened more often if early forms of precancerous polyps or small growths are found. Skin Cancer  Check your skin from head to toe regularly.  Tell your health care provider about any new moles or changes in moles, especially if there is a change in a mole's shape or color.  Also tell your health care provider if you have a mole that is larger than the size of a pencil eraser.  Always use sunscreen. Apply sunscreen liberally and repeatedly throughout the day.  Protect yourself by wearing long sleeves, pants, a wide-brimmed  hat, and sunglasses whenever you are outside. HEART DISEASE, DIABETES, AND HIGH BLOOD PRESSURE   High blood pressure causes heart disease and increases the risk of stroke. High blood pressure is more likely to develop in:  People who have blood pressure in the high end of the normal range (130-139/85-89 mm Hg).  People who are overweight or obese.  People who are African American.  If you are 13-85 years of age, have your blood pressure checked every 3-5 years. If you are 47 years of age or older, have your blood pressure checked every year. You should have your blood pressure measured twice--once when you are at a hospital or clinic, and once when you are not at a hospital or clinic. Record the average of the two measurements. To check your blood pressure when you are not at a hospital or clinic, you can use:  An automated blood pressure machine at a pharmacy.  A home blood pressure monitor.  If you are between 51 years and 71 years old, ask your health care provider if you should take aspirin to prevent strokes.  Have regular diabetes screenings. This involves taking a blood sample to check your fasting blood sugar level.  If you are at a normal weight and have a low risk for diabetes, have this test once every three years after 80 years of age.  If you are overweight and have a high risk for diabetes, consider being tested at a younger age or more often. PREVENTING INFECTION  Hepatitis B  If you have a higher risk for hepatitis B, you should be screened for this virus. You are considered at high risk for hepatitis B if:  You were born in a country where hepatitis B is common. Ask your health care provider which countries are considered high risk.  Your parents were born in a high-risk country, and you have not been immunized against hepatitis B (hepatitis B vaccine).  You have HIV or AIDS.  You use needles to inject street drugs.  You live with someone who has hepatitis  B.  You have had sex with someone who has hepatitis B.  You get hemodialysis treatment.  You take certain medicines for conditions, including cancer, organ transplantation, and autoimmune conditions. Hepatitis C  Blood testing is recommended for:  Everyone born from [birth year] through 1963.  Anyone with known risk factors for hepatitis C. Sexually transmitted infections (STIs)  You should be screened for sexually  transmitted infections (STIs) including gonorrhea and chlamydia if:  You are sexually active and are younger than 80 years of age.  You are older than 80 years of age and your health care provider tells you that you are at risk for this type of infection.  Your sexual activity has changed since you were last screened and you are at an increased risk for chlamydia or gonorrhea. Ask your health care provider if you are at risk.  If you do not have HIV, but are at risk, it may be recommended that you take a prescription medicine daily to prevent HIV infection. This is called pre-exposure prophylaxis (PrEP). You are considered at risk if:  You are sexually active and do not regularly use condoms or know the HIV status of your partner(s).  You take drugs by injection.  You are sexually active with a partner who has HIV. Talk with your health care provider about whether you are at high risk of being infected with HIV. If you choose to begin PrEP, you should first be tested for HIV. You should then be tested every 3 months for as long as you are taking PrEP.  PREGNANCY   If you are premenopausal and you may become pregnant, ask your health care provider about preconception counseling.  If you may become pregnant, take 400 to 800 micrograms (mcg) of folic acid every day.  If you want to prevent pregnancy, talk to your health care provider about birth control (contraception). OSTEOPOROSIS AND MENOPAUSE   Osteoporosis is a disease in which the bones lose minerals and strength with  aging. This can result in serious bone fractures. Your risk for osteoporosis can be identified using a bone density scan.  If you are 2 years of age or older, or if you are at risk for osteoporosis and fractures, ask your health care provider if you should be screened.  Ask your health care provider whether you should take a calcium or vitamin D supplement to lower your risk for osteoporosis.  Menopause may have certain physical symptoms and risks.  Hormone replacement therapy may reduce some of these symptoms and risks. Talk to your health care provider about whether hormone replacement therapy is right for you.  HOME CARE INSTRUCTIONS   Schedule regular health, dental, and eye exams.  Stay current with your immunizations.   Do not use any tobacco products including cigarettes, chewing tobacco, or electronic cigarettes.  If you are pregnant, do not drink alcohol.  If you are breastfeeding, limit how much and how often you drink alcohol.  Limit alcohol intake to no more than 1 drink per day for nonpregnant women. One drink equals 12 ounces of beer, 5 ounces of wine, or 1 ounces of hard liquor.  Do not use street drugs.  Do not share needles.  Ask your health care provider for help if you need support or information about quitting drugs.  Tell your health care provider if you often feel depressed.  Tell your health care provider if you have ever been abused or do not feel safe at home.   This information is not intended to replace advice given to you by your health care provider. Make sure you discuss any questions you have with your health care provider.   Document Released: 03/13/2011 Document Revised: 09/18/2014 Document Reviewed: 07/30/2013 Elsevier Interactive Patient Education [employer].

## 2015-10-08 NOTE — Progress Notes (Signed)
Pre visit review using our clinic review tool, if applicable. No additional management support is needed unless otherwise documented below in the visit note. 

## 2015-10-09 ENCOUNTER — Other Ambulatory Visit: Payer: Self-pay | Admitting: Internal Medicine

## 2015-10-09 NOTE — Assessment & Plan Note (Signed)
Check a1c Continue healthy lifestyle - exercise, healthy diet

## 2015-10-09 NOTE — Assessment & Plan Note (Signed)
Check lipid panel  Continue daily statin Regular exercise and healthy diet

## 2015-10-09 NOTE — Assessment & Plan Note (Signed)
dexa up to date Taking calcium, vit d Exercises regularly

## 2015-10-09 NOTE — Assessment & Plan Note (Signed)
Check tsh  Titrate med dose if needed  

## 2015-10-09 NOTE — Assessment & Plan Note (Signed)
Check cbc, iron levels 

## 2015-11-04 DIAGNOSIS — Z961 Presence of intraocular lens: Secondary | ICD-10-CM | POA: Diagnosis not present

## 2015-11-04 DIAGNOSIS — H26493 Other secondary cataract, bilateral: Secondary | ICD-10-CM | POA: Diagnosis not present

## 2015-11-13 ENCOUNTER — Other Ambulatory Visit: Payer: Self-pay | Admitting: Internal Medicine

## 2015-11-20 ENCOUNTER — Ambulatory Visit: Payer: PPO | Admitting: Family Medicine

## 2015-12-09 DIAGNOSIS — H6123 Impacted cerumen, bilateral: Secondary | ICD-10-CM | POA: Insufficient documentation

## 2015-12-09 DIAGNOSIS — H6121 Impacted cerumen, right ear: Secondary | ICD-10-CM | POA: Diagnosis not present

## 2015-12-09 DIAGNOSIS — H9011 Conductive hearing loss, unilateral, right ear, with unrestricted hearing on the contralateral side: Secondary | ICD-10-CM | POA: Insufficient documentation

## 2015-12-24 ENCOUNTER — Other Ambulatory Visit: Payer: Self-pay | Admitting: Internal Medicine

## 2016-02-10 ENCOUNTER — Other Ambulatory Visit: Payer: Self-pay | Admitting: Internal Medicine

## 2016-02-17 ENCOUNTER — Other Ambulatory Visit: Payer: Self-pay | Admitting: Internal Medicine

## 2016-03-14 ENCOUNTER — Other Ambulatory Visit: Payer: Self-pay | Admitting: Internal Medicine

## 2016-04-07 DIAGNOSIS — N63 Unspecified lump in breast: Secondary | ICD-10-CM | POA: Diagnosis not present

## 2016-04-12 DIAGNOSIS — C779 Secondary and unspecified malignant neoplasm of lymph node, unspecified: Secondary | ICD-10-CM | POA: Diagnosis not present

## 2016-04-12 DIAGNOSIS — R938 Abnormal findings on diagnostic imaging of other specified body structures: Secondary | ICD-10-CM | POA: Diagnosis not present

## 2016-04-12 DIAGNOSIS — N63 Unspecified lump in breast: Secondary | ICD-10-CM | POA: Diagnosis not present

## 2016-04-12 DIAGNOSIS — C50912 Malignant neoplasm of unspecified site of left female breast: Secondary | ICD-10-CM | POA: Diagnosis not present

## 2016-04-18 ENCOUNTER — Other Ambulatory Visit: Payer: Self-pay | Admitting: Surgery

## 2016-04-18 DIAGNOSIS — C50912 Malignant neoplasm of unspecified site of left female breast: Secondary | ICD-10-CM | POA: Diagnosis not present

## 2016-04-19 ENCOUNTER — Encounter: Payer: Self-pay | Admitting: Genetic Counselor

## 2016-04-19 ENCOUNTER — Telehealth: Payer: Self-pay | Admitting: Hematology and Oncology

## 2016-04-19 ENCOUNTER — Encounter: Payer: Self-pay | Admitting: Hematology and Oncology

## 2016-04-19 NOTE — Telephone Encounter (Signed)
Appointment scheduled with Dr. Lindi Adie on 8/21 at 3:45pm. Patient made aware to arrive 30 minutes early. She wanted appointment scheduled after her vacation. Demographics verified. Letter mailed to the patient and faxed to the referring.

## 2016-04-21 ENCOUNTER — Other Ambulatory Visit: Payer: Self-pay | Admitting: Surgery

## 2016-04-30 ENCOUNTER — Encounter: Payer: Self-pay | Admitting: Internal Medicine

## 2016-04-30 DIAGNOSIS — C50412 Malignant neoplasm of upper-outer quadrant of left female breast: Secondary | ICD-10-CM | POA: Insufficient documentation

## 2016-05-01 ENCOUNTER — Ambulatory Visit (HOSPITAL_BASED_OUTPATIENT_CLINIC_OR_DEPARTMENT_OTHER): Payer: PPO | Admitting: Hematology and Oncology

## 2016-05-01 ENCOUNTER — Encounter: Payer: Self-pay | Admitting: Internal Medicine

## 2016-05-01 ENCOUNTER — Encounter: Payer: Self-pay | Admitting: Hematology and Oncology

## 2016-05-01 VITALS — BP 190/67 | HR 75 | Temp 98.1°F | Resp 17 | Ht 62.0 in | Wt 116.6 lb

## 2016-05-01 DIAGNOSIS — C50412 Malignant neoplasm of upper-outer quadrant of left female breast: Secondary | ICD-10-CM

## 2016-05-01 DIAGNOSIS — C50812 Malignant neoplasm of overlapping sites of left female breast: Secondary | ICD-10-CM | POA: Diagnosis not present

## 2016-05-01 DIAGNOSIS — Z171 Estrogen receptor negative status [ER-]: Secondary | ICD-10-CM

## 2016-05-01 DIAGNOSIS — C773 Secondary and unspecified malignant neoplasm of axilla and upper limb lymph nodes: Secondary | ICD-10-CM

## 2016-05-01 MED ORDER — ONDANSETRON HCL 8 MG PO TABS: 8.0000 mg | ORAL_TABLET | Freq: Two times a day (BID) | ORAL | 1 refills | Status: DC | PRN

## 2016-05-01 MED ORDER — PROCHLORPERAZINE MALEATE 10 MG PO TABS: 10.0000 mg | ORAL_TABLET | Freq: Four times a day (QID) | ORAL | 1 refills | Status: DC | PRN

## 2016-05-01 MED ORDER — LIDOCAINE-PRILOCAINE 2.5-2.5 % EX CREA: TOPICAL_CREAM | CUTANEOUS | 3 refills | Status: DC

## 2016-05-01 NOTE — Progress Notes (Signed)
[facility] CONSULT NOTE  Patient Care Team: [name], MD as PCP - General (Internal Medicine)  CHIEF COMPLAINTS/PURPOSE OF CONSULTATION:  Newly diagnosed breast cancer  HISTORY OF PRESENTING ILLNESS:  Jessica Sherman 82 y.o. female is here because of recent diagnosis of left breast cancer. Patient felt a left breast mass and then underwent a mammogram. Is unclear how long she has had this mass. She had additional imaging which revealed a 5 cm tumor with enlarged axillary lymph node by ultrasound. 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 She was seen by Dr. Ninfa Linden who sent her to [place] to discuss multidisciplinary treatment options. Patient is here today accompanied by her daughter and her daughter-in-law were both extremely helpful to her. Her husband passed away 8 years ago from lung cancer. She is very anxious and worried about today's visit.  I reviewed her records extensively and collaborated the history with the patient.  SUMMARY OF ONCOLOGIC HISTORY:   Breast cancer of upper-outer quadrant of left female breast ([place])   04/12/2016 Initial Diagnosis    Left breast biopsy: IDC grade 2-3, left axillary LN positive for metastatic IDC; mammogram revealed a 5 cm solid mass left breast 12:00 anterior depth      MEDICAL HISTORY:  Past Medical History:  Diagnosis Date  . Hx of colonic polyps   . Hyperlipidemia   . Hypothyroidism   . Macular degeneration, dry   . Osteopenia    spine BMD T score - 1.69  . Prediabetes    FBS 136  on 08/29/2010; A1c 6.2%  . Vertigo     SURGICAL HISTORY: Past Surgical History:  Procedure Laterality Date  . CATARACT EXTRACTION  2009   OS  . COLONOSCOPY W/ POLYPECTOMY     X 2; last 2012. Dr Watt Climes  . PILONIDAL CYST EXCISION     X 3  . TONSILLECTOMY AND ADENOIDECTOMY    .  TOTAL ABDOMINAL HYSTERECTOMY     w/ bladder repair    SOCIAL HISTORY: Social History   Social History  . Marital status: Married    Spouse name: N/A  . Number of children: N/A  . Years of education: N/A   Occupational History  . retired    Social History Main Topics  . Smoking status: Never Smoker  . Smokeless tobacco: Not on file  . Alcohol use No     Comment:    . Drug use: No  . Sexual activity: Not on file   Other Topics Concern  . Not on file   Social History Narrative   Regular exercise- yes 31md w/o symptoms    FAMILY HISTORY: Family History  Problem Relation Age of Onset  . Osteoporosis Mother   . Leukemia Mother   . Cancer Maternal Uncle     ? stomach  . Aneurysm Father     AAA  . Heart attack Neg Hx     ALLERGIES:  is allergic to other.  MEDICATIONS:  Current Outpatient Prescriptions  Medication Sig Dispense Refill  . Calcium Carb-Cholecalciferol (CALCIUM 600+D3 PO) Take 1 tablet by mouth 2 (two) times daily.    . Cholecalciferol (VITAMIN D3) 1000 UNITS CAPS Take 1,000 Units by mouth daily.     .Marland Kitchenlevothyroxine (SYNTHROID, LEVOTHROID) 88 MCG tablet TAKE 1 TABLET BY MOUTH DAILY EXCEPT TAKE  1/2 TABLET EVERY WEDNESDAY; MUST HAVE LABS FOR FURTHER REFILLS (Patient taking differently: TAKE 1 TABLET (88 mcg) BY MOUTH DAILY EXCEPT TAKE 1/2 TABLET (44 mcg) EVERY WEDNESDAY; MUST HAVE LABS FOR FURTHER REFILLS) 30 tablet 5  . meclizine (ANTIVERT) 25 MG tablet TAKE 1/2 TABLET BY MOUTH THREE TIMES DAILY AS NEEDED (Patient taking differently: TAKE 1/2 TABLET (12.5 mg) BY MOUTH THREE TIMES DAILY AS NEEDED for vertigo) 15 tablet 2  . metoprolol tartrate (LOPRESSOR) 25 MG tablet 1 bid if BP averages > 140/90 on average (Patient taking differently: Take 25 mg by mouth 2 (two) times daily as needed (If BP averages > 140/90). ) 60 tablet 2  . Multiple Vitamins-Minerals (CENTRUM SILVER PO) Take 1 tablet by mouth daily.     . Multiple Vitamins-Minerals (PRESERVISION/LUTEIN)  CAPS Take 1 capsule by mouth 2 (two) times daily.     . simvastatin (ZOCOR) 40 MG tablet Take 20 mg by mouth every evening.    . simvastatin (ZOCOR) 40 MG tablet TAKE 1 TABLET BY MOUTH DAILY (Patient not taking: Reported on 04/28/2016) 90 tablet 1   No current facility-administered medications for this visit.     REVIEW OF SYSTEMS:   Constitutional: Denies fevers, chills or abnormal night sweats Eyes: Denies blurriness of vision, double vision or watery eyes Ears, nose, mouth, throat, and face: Denies mucositis or sore throat Respiratory: Denies cough, dyspnea or wheezes Cardiovascular: Denies palpitation, chest discomfort or lower extremity swelling Gastrointestinal:  Denies nausea, heartburn or change in bowel habits Skin: Denies abnormal skin rashes Lymphatics: Denies new lymphadenopathy or easy bruising Neurological:Denies numbness, tingling or new weaknesses Behavioral/Psych: Mood is stable, no new changes  Breast: Large lump is palpable in the left breast All other systems were reviewed with the patient and are negative.  PHYSICAL EXAMINATION: ECOG PERFORMANCE STATUS: 1 - Symptomatic but completely ambulatory  Vitals:   05/01/16 1535  BP: (!) 190/67  Pulse: 75  Resp: 17  Temp: 98.1 F (36.7 C)   Filed Weights   05/01/16 1535  Weight: 116 lb 9.6 oz (52.9 kg)    GENERAL:alert, no distress and comfortable SKIN: skin color, texture, turgor are normal, no rashes or significant lesions EYES: normal, conjunctiva are pink and non-injected, sclera clear OROPHARYNX:no exudate, no erythema and lips, buccal mucosa, and tongue normal  NECK: supple, thyroid normal size, non-tender, without nodularity LYMPH:  no palpable lymphadenopathy in the cervical, axillary or inguinal LUNGS: clear to auscultation and percussion with normal breathing effort HEART: regular rate & rhythm and no murmurs and no lower extremity edema ABDOMEN:abdomen soft, non-tender and normal bowel  sounds Musculoskeletal:no cyanosis of digits and no clubbing  PSYCH: alert & oriented x 3 with fluent speech NEURO: no focal motor/sensory deficits BREAST:6-7 cm size lump in the left breast is palpable. No palpable axillary or supraclavicular lymphadenopathy (exam performed in the presence of a chaperone)   LABORATORY DATA:  I have reviewed the data as listed Lab Results  Component Value Date   WBC 7.1 09/15/2014   HGB 12.8 09/15/2014   HCT 35.8 (L) 09/15/2014   MCV 97.1 09/15/2014   PLT 338.0 09/15/2014   Lab Results  Component Value Date   NA 138 09/15/2014   K 4.3 09/15/2014   CL 102 09/15/2014   CO2 26 09/15/2014    ASSESSMENT AND PLAN:  Breast cancer of upper-outer quadrant of left female breast ([place]) Left breast biopsy 04/12/2016: IDC grade 2-3, left axillary LN positive for metastatic IDC; mammogram revealed a 5  cm solid mass left breast 12:00 anterior depth.  Pathology and Radiology Counseling: Discussed with the patient, the details of pathology including the type of breast cancer,the clinical staging, the significance of ER, PR and HER-2/neu receptors and the implications for treatment. After reviewing the pathology in detail, we proceeded to discuss the different treatment options between surgery, radiation, and chemotherapy.  Recommendation: 1. Neoadjuvant chemotherapy with Taxol Herceptin Perjeta. Taxol to be given weekly 12, Herceptin Perjeta to be given every 3 weeks 6 cycles 2. Mastectomy with sentinel lymph node biopsy/selective axillary node dissection 3. +/- adjuvant radiation  Plan: 1. Presentation multidisciplinary tumor board 2. Breast MRI 3. Echocardiogram and cardiology evaluation 4. CT chest abdomen pelvis and bone scan 5. Chemotherapy class  Return to clinon 05/16/2016 to start chemotherapy.    All questions were answered. The patient knows to call the clinic with any problems, questions or concerns.    Rulon Eisenmenger, MD 05/01/16

## 2016-05-01 NOTE — Assessment & Plan Note (Signed)
Left breast biopsy 04/12/2016: IDC grade 2-3, left axillary LN positive for metastatic IDC; mammogram revealed a 5 cm solid mass left breast 12:00 anterior depth.  Pathology and Radiology Counseling: Discussed with the patient, the details of pathology including the type of breast cancer,the clinical staging, the significance of ER, PR and HER-2/neu receptors and the implications for treatment. After reviewing the pathology in detail, we proceeded to discuss the different treatment options between surgery, radiation, chemotherapy, antiestrogen therapies.  Recommendation: 1. Mastectomy with axillary lymph node dissection 2. await the results of ER/PR and HER-2/neu testing 3. +/- adjuvant radiation 4. Other adjuvant therapies based upon the results of ER/PR and HER-2 testing and final pathology report.  Return to clinic after surgery to discuss the results.

## 2016-05-02 ENCOUNTER — Telehealth: Payer: Self-pay | Admitting: Hematology and Oncology

## 2016-05-02 ENCOUNTER — Telehealth: Payer: Self-pay | Admitting: *Deleted

## 2016-05-02 ENCOUNTER — Other Ambulatory Visit: Payer: Self-pay | Admitting: *Deleted

## 2016-05-02 ENCOUNTER — Encounter: Payer: Self-pay | Admitting: Internal Medicine

## 2016-05-02 DIAGNOSIS — C50412 Malignant neoplasm of upper-outer quadrant of left female breast: Secondary | ICD-10-CM

## 2016-05-02 NOTE — Telephone Encounter (Signed)
Spoke with pt to confirm 9/6 appt dates/times. Per IR will schedule port placement and contact pt with information

## 2016-05-02 NOTE — Telephone Encounter (Signed)
Per LOS I have added appt. Scheduler notified

## 2016-05-03 ENCOUNTER — Encounter: Payer: Self-pay | Admitting: Hematology and Oncology

## 2016-05-03 ENCOUNTER — Encounter (HOSPITAL_COMMUNITY): Admission: RE | Admit: 2016-05-03 | Payer: PPO | Source: Ambulatory Visit

## 2016-05-03 ENCOUNTER — Telehealth: Payer: Self-pay | Admitting: *Deleted

## 2016-05-03 NOTE — Telephone Encounter (Signed)
  Oncology Nurse Navigator Documentation  Navigator Location: [facility] (05/03/16 1000) Navigator Encounter Type: Introductory phone call (05/03/16 1000)   Abnormal Finding Date: 04/07/16 (05/03/16 1000) Confirmed Diagnosis Date: 04/12/16 (05/03/16 1000)   Treatment Initiated Date: 05/17/16 (05/03/16 1000)     Barriers/Navigation Needs: Coordination of Care (05/03/16 1000)   Interventions: Coordination of Care (05/03/16 1000)            Acuity: Level 2 (05/03/16 1000)      Time Spent with Patient: 30 (05/03/16 1000)   Called to introduce navigation resources. Discussed care plan. Pt will have neoadjuvant chemotherapy. Informed Dr. Ninfa Linden of treatment decision.  Pt wishes for daughter [name] to be called with her appts.Informed pt she will be notified of her appts for CT/Bone scan, breast MRI and chemotherapy class. Confirmed future appts that have been scheduled. Denies further questions or needs. Contact information provided.

## 2016-05-03 NOTE — Progress Notes (Signed)
Sent via covermymeds ondansetron prior auth req. Recd envision form in box- completed and faxed [phone]

## 2016-05-04 ENCOUNTER — Ambulatory Visit (HOSPITAL_COMMUNITY)
Admission: RE | Admit: 2016-05-04 | Discharge: 2016-05-04 | Disposition: A | Discharge status: Home / Self Care | Payer: PPO | Source: Ambulatory Visit | Attending: Internal Medicine | Admitting: Internal Medicine

## 2016-05-04 ENCOUNTER — Telehealth: Payer: Self-pay | Admitting: *Deleted

## 2016-05-04 DIAGNOSIS — Z79899 Other long term (current) drug therapy: Secondary | ICD-10-CM | POA: Insufficient documentation

## 2016-05-04 DIAGNOSIS — I340 Nonrheumatic mitral (valve) insufficiency: Secondary | ICD-10-CM | POA: Insufficient documentation

## 2016-05-04 DIAGNOSIS — I071 Rheumatic tricuspid insufficiency: Secondary | ICD-10-CM | POA: Diagnosis not present

## 2016-05-04 DIAGNOSIS — Z5181 Encounter for therapeutic drug level monitoring: Secondary | ICD-10-CM | POA: Insufficient documentation

## 2016-05-04 DIAGNOSIS — C50412 Malignant neoplasm of upper-outer quadrant of left female breast: Secondary | ICD-10-CM | POA: Insufficient documentation

## 2016-05-04 NOTE — Telephone Encounter (Signed)
Spoke with Margaretha Sheffield with [employer] about appropriateness of zofran for use at home after taxol.  Let her know that patient would receive IV premedications with taxol and some would help with nausea.  Let her know that zofran is a better choice as phenergan or compazine may make this 44yr.old patient drowsy and at an increased risk for falling.  She verbalized understanding and will approve drug .

## 2016-05-04 NOTE — Progress Notes (Signed)
  Echocardiogram 2D Echocardiogram has been performed.  Jessica Sherman 05/04/2016, 11:52 AM

## 2016-05-07 ENCOUNTER — Telehealth: Payer: Self-pay | Admitting: Hematology and Oncology

## 2016-05-07 NOTE — Telephone Encounter (Signed)
S/w pt, gv appt for chemo ed on 8/29 @ 12p.

## 2016-05-08 ENCOUNTER — Other Ambulatory Visit: Payer: Self-pay | Admitting: Radiology

## 2016-05-08 ENCOUNTER — Ambulatory Visit (HOSPITAL_COMMUNITY)
Admission: RE | Admit: 2016-05-08 | Discharge: 2016-05-08 | Disposition: A | Discharge status: Home / Self Care | Payer: PPO | Source: Ambulatory Visit | Attending: Internal Medicine | Admitting: Internal Medicine

## 2016-05-08 ENCOUNTER — Encounter (HOSPITAL_COMMUNITY): Payer: Self-pay | Admitting: Internal Medicine

## 2016-05-08 VITALS — BP 150/78 | HR 72 | Wt 116.0 lb

## 2016-05-08 DIAGNOSIS — E785 Hyperlipidemia, unspecified: Secondary | ICD-10-CM | POA: Diagnosis not present

## 2016-05-08 DIAGNOSIS — R7303 Prediabetes: Secondary | ICD-10-CM | POA: Diagnosis not present

## 2016-05-08 DIAGNOSIS — Z79899 Other long term (current) drug therapy: Secondary | ICD-10-CM | POA: Insufficient documentation

## 2016-05-08 DIAGNOSIS — E039 Hypothyroidism, unspecified: Secondary | ICD-10-CM | POA: Insufficient documentation

## 2016-05-08 DIAGNOSIS — Z171 Estrogen receptor negative status [ER-]: Secondary | ICD-10-CM | POA: Diagnosis not present

## 2016-05-08 DIAGNOSIS — I10 Essential (primary) hypertension: Secondary | ICD-10-CM | POA: Insufficient documentation

## 2016-05-08 DIAGNOSIS — Z806 Family history of leukemia: Secondary | ICD-10-CM | POA: Diagnosis not present

## 2016-05-08 DIAGNOSIS — C50412 Malignant neoplasm of upper-outer quadrant of left female breast: Secondary | ICD-10-CM | POA: Diagnosis not present

## 2016-05-08 DIAGNOSIS — M8580 Other specified disorders of bone density and structure, unspecified site: Secondary | ICD-10-CM | POA: Insufficient documentation

## 2016-05-08 DIAGNOSIS — C773 Secondary and unspecified malignant neoplasm of axilla and upper limb lymph nodes: Secondary | ICD-10-CM | POA: Insufficient documentation

## 2016-05-08 DIAGNOSIS — H3530 Unspecified macular degeneration: Secondary | ICD-10-CM | POA: Insufficient documentation

## 2016-05-08 DIAGNOSIS — Z8262 Family history of osteoporosis: Secondary | ICD-10-CM | POA: Insufficient documentation

## 2016-05-08 NOTE — Addendum Note (Signed)
Encounter addended by: Scarlette Calico, RN on: 05/08/2016  4:41 PM<BR>    Actions taken: Sign clinical note

## 2016-05-08 NOTE — Progress Notes (Signed)
CARDIO-ONCOLOGY CLINIC CONSULT NOTE  Referring Physician: Lindi Adie Primary Cardiologist: Maxamillion Banas (new)  HPI:  Jessica Sherman 82 y.o. female with h/o hyperlipidemia, hypothyroidism with no known h/o heart disease referred by Dr. Lindi Adie for enrollment into the cardio-oncology program due to left breast cancer.  Diagnosed with left breast cancer 8/17.  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 LN positive for metastatic disease.    SUMMARY OF ONCOLOGIC HISTORY:       Breast cancer of upper-outer quadrant of left female breast ([place])   04/12/2016 Initial Diagnosis    Left breast biopsy: IDC grade 2-3, left axillary LN positive for metastatic IDC; mammogram revealed a 5 cm solid mass left breast 12:00 anterior depth      At baseline very active. No CP or SOB. No edema, orthopnea or PND. + vertigo.   ECHO 8/17: EF 55-60%. Grade I DD. Mild TR/MR. LS' 11.5 GLS -20.0%  Review of Systems: [y] = yes, '[ ]'  = no   General: Weight gain '[ ]' ; Weight loss '[ ]' ; Anorexia '[ ]' ; Fatigue '[ ]' ; Fever '[ ]' ; Chills '[ ]' ; Weakness '[ ]'   Cardiac: Chest pain/pressure '[ ]' ; Resting SOB '[ ]' ; Exertional SOB '[ ]' ; Orthopnea '[ ]' ; Pedal Edema '[ ]' ; Palpitations '[ ]' ; Syncope '[ ]' ; Presyncope '[ ]' ; Paroxysmal nocturnal dyspnea'[ ]'   Pulmonary: Cough '[ ]' ; Wheezing'[ ]' ; Hemoptysis'[ ]' ; Sputum '[ ]' ; Snoring '[ ]'   GI: Vomiting'[ ]' ; Dysphagia'[ ]' ; Melena'[ ]' ; Hematochezia '[ ]' ; Heartburn'[ ]' ; Abdominal pain '[ ]' ; Constipation '[ ]' ; Diarrhea '[ ]' ; BRBPR '[ ]'   GU: Hematuria'[ ]' ; Dysuria '[ ]' ; Nocturia'[ ]'   Vascular: Pain in legs with walking '[ ]' ; Pain in feet with lying flat '[ ]' ; Non-healing sores '[ ]' ; Stroke '[ ]' ; TIA '[ ]' ; Slurred speech '[ ]' ;  Neuro: Headaches'[ ]' ; Vertigo'[ ]' ; Seizures'[ ]' ; Paresthesias'[ ]' ;Blurred vision '[ ]' ; Diplopia '[ ]' ; Vision changes '[ ]'   Ortho/Skin: Arthritis Blue.Reese ];  Joint pain [ y]; Muscle pain '[ ]' ; Joint swelling '[ ]' ; Back Pain '[ ]' ; Rash '[ ]'   Psych: Depression'[ ]' ; Anxiety'[ ]'   Heme: Bleeding problems '[ ]' ; Clotting disorders '[ ]' ; Anemia '[ ]'   Endocrine: Diabetes '[ ]' ; Thyroid dysfunction[y ]   Past Medical History:  Diagnosis Date  . Hx of colonic polyps   . Hyperlipidemia   . Hypothyroidism   . Macular degeneration, dry   . Osteopenia    spine BMD T score - 1.69  . Prediabetes    FBS 136  on 08/29/2010; A1c 6.2%  . Vertigo     Current Outpatient Prescriptions  Medication Sig Dispense Refill  . Calcium Carb-Cholecalciferol (CALCIUM 600+D3 PO) Take 1 tablet by mouth 2 (two) times daily.    . Cholecalciferol (VITAMIN D3) 1000 UNITS CAPS Take 1,000 Units by mouth daily.     Marland Kitchen levothyroxine (SYNTHROID, LEVOTHROID) 88 MCG tablet TAKE 1 TABLET BY MOUTH DAILY EXCEPT TAKE 1/2 TABLET EVERY WEDNESDAY; MUST HAVE LABS FOR FURTHER REFILLS (Patient taking differently: TAKE 1 TABLET (88 mcg) BY MOUTH DAILY EXCEPT TAKE 1/2 TABLET (44 mcg) EVERY WEDNESDAY; MUST HAVE LABS FOR FURTHER REFILLS) 30 tablet 5  . lidocaine-prilocaine (EMLA) cream Apply to affected area once 30 g 3  . meclizine (ANTIVERT) 25 MG tablet TAKE 1/2 TABLET BY MOUTH THREE TIMES DAILY AS NEEDED (Patient  taking differently: TAKE 1/2 TABLET (12.5 mg) BY MOUTH THREE TIMES DAILY AS NEEDED for vertigo) 15 tablet 2  . Multiple Vitamins-Minerals (CENTRUM SILVER PO) Take 1 tablet by mouth daily.     . Multiple Vitamins-Minerals (PRESERVISION/LUTEIN) CAPS Take 1 capsule by mouth 2 (two) times daily.     . ondansetron (ZOFRAN) 8 MG tablet Take 1 tablet (8 mg total) by mouth 2 (two) times daily as needed (Nausea or vomiting). 30 tablet 1  . prochlorperazine (COMPAZINE) 10 MG tablet Take 1 tablet (10 mg total) by mouth every 6 (six) hours as needed (Nausea or vomiting). 30 tablet 1  . simvastatin (ZOCOR) 40 MG tablet Take 20 mg by mouth every evening.    . simvastatin (ZOCOR) 40 MG tablet TAKE 1 TABLET BY MOUTH  DAILY 90 tablet 1  . metoprolol tartrate (LOPRESSOR) 25 MG tablet 1 bid if BP averages > 140/90 on average (Patient taking differently: Take 25 mg by mouth 2 (two) times daily as needed (If BP averages > 140/90). ) 60 tablet 2   No current facility-administered medications for this encounter.     Allergies  Allergen Reactions  . Other Nausea And Vomiting    Anesthesia "makes me very sick", Please pre-medicate to control nausea and vomiting      Social History   Social History  . Marital status: Married    Spouse name: N/A  . Number of children: N/A  . Years of education: N/A   Occupational History  . retired    Social History Main Topics  . Smoking status: Never Smoker  . Smokeless tobacco: Never Used  . Alcohol use No     Comment:    . Drug use: No  . Sexual activity: Not on file   Other Topics Concern  . Not on file   Social History Narrative   Regular exercise- yes 62md w/o symptoms      Family History  Problem Relation Age of Onset  . Osteoporosis Mother   . Leukemia Mother   . Cancer Maternal Uncle     ? stomach  . Aneurysm Father     AAA  . Heart attack Neg Hx     Vitals:   05/08/16 1542  BP: (!) 150/78  Pulse: 72  SpO2: 98%  Weight: 116 lb (52.6 kg)    PHYSICAL EXAM: General:  Well appearing. Frail. No respiratory difficulty HEENT: normal Neck: supple. JVP 8 Carotids 2+ bilat; no bruits. No lymphadenopathy or thryomegaly appreciated. Cor: PMI nondisplaced. Regular rate & rhythm. 2/6 TR Lungs: clear Abdomen: soft, nontender, nondistended. No hepatosplenomegaly. No bruits or masses. Good bowel sounds. Extremities: no cyanosis, clubbing, rash, edema Neuro: alert & oriented x 3, cranial nerves grossly intact. moves all 4 extremities w/o difficulty. Affect pleasant.   ASSESSMENT & PLAN: 1. Breast cancer of upper-outer quadrant of left female breast ([place] Left breast biopsy 04/12/2016: IDC grade 2-3, left axillary LN positive for metastatic  IDC; mammogram revealed a 5 cm solid mass left breast 12:00 anterior depth. --plan for neoadjuvant chemotherapy with Taxol Herceptin Perjeta. Taxol to be given weekly 12, Herceptin Perjeta to be given every 3 weeks 6 cycles. Followed by mastectomy with sentinel lymph node biopsy/selective axillary node dissection. +/- adjuvant radiation --Explained incidence of Herceptin cardiotoxicity and role of Cardio-oncology clinic at length. Echo images reviewed personally. All parameters stable. Reviewed signs and symptoms of HF to look for. Ok to start Herceptin. Follow-up with echo in 3 months.  2. Hypertension --BP elevated  here but attributed to white-coat HTN. Says BP at home is ok.   Julio Zappia,MD 4:35 PM

## 2016-05-08 NOTE — Patient Instructions (Signed)
We will contact you in 3 months to schedule your next appointment and echocardiogram  

## 2016-05-09 ENCOUNTER — Telehealth: Payer: Self-pay

## 2016-05-09 ENCOUNTER — Encounter (HOSPITAL_COMMUNITY): Payer: Self-pay

## 2016-05-09 ENCOUNTER — Ambulatory Visit (HOSPITAL_COMMUNITY)
Admission: RE | Admit: 2016-05-09 | Discharge: 2016-05-09 | Disposition: A | Discharge status: Home / Self Care | Payer: PPO | Source: Ambulatory Visit | Attending: Hematology and Oncology | Admitting: Hematology and Oncology

## 2016-05-09 ENCOUNTER — Other Ambulatory Visit: Payer: Self-pay | Admitting: Hematology and Oncology

## 2016-05-09 ENCOUNTER — Telehealth: Payer: Self-pay | Admitting: Hematology and Oncology

## 2016-05-09 ENCOUNTER — Other Ambulatory Visit: Payer: PPO

## 2016-05-09 ENCOUNTER — Encounter: Payer: Self-pay | Admitting: *Deleted

## 2016-05-09 ENCOUNTER — Other Ambulatory Visit (HOSPITAL_COMMUNITY): Payer: PPO

## 2016-05-09 DIAGNOSIS — C50412 Malignant neoplasm of upper-outer quadrant of left female breast: Secondary | ICD-10-CM | POA: Diagnosis not present

## 2016-05-09 DIAGNOSIS — Z5111 Encounter for antineoplastic chemotherapy: Secondary | ICD-10-CM | POA: Diagnosis not present

## 2016-05-09 HISTORY — PX: IR GENERIC HISTORICAL: IMG1180011

## 2016-05-09 LAB — CBC WITH DIFFERENTIAL/PLATELET
BASOS PCT: 1 %
Basophils Absolute: 0.1 10*3/uL (ref 0.0–0.1)
Eosinophils Absolute: 0.1 10*3/uL (ref 0.0–0.7)
Eosinophils Relative: 1 %
HEMATOCRIT: 38.7 % (ref 36.0–46.0)
HEMOGLOBIN: 12.8 g/dL (ref 12.0–15.0)
LYMPHS ABS: 2 10*3/uL (ref 0.7–4.0)
Lymphocytes Relative: 22 %
MCH: 31.8 pg (ref 26.0–34.0)
MCHC: 33.1 g/dL (ref 30.0–36.0)
MCV: 96 fL (ref 78.0–100.0)
MONOS PCT: 9 %
Monocytes Absolute: 0.8 10*3/uL (ref 0.1–1.0)
NEUTROS ABS: 5.9 10*3/uL (ref 1.7–7.7)
NEUTROS PCT: 67 %
Platelets: 291 10*3/uL (ref 150–400)
RBC: 4.03 MIL/uL (ref 3.87–5.11)
RDW: 12.8 % (ref 11.5–15.5)
WBC: 8.8 10*3/uL (ref 4.0–10.5)

## 2016-05-09 LAB — APTT: aPTT: 43 seconds — ABNORMAL HIGH (ref 24–36)

## 2016-05-09 LAB — PROTIME-INR
INR: 0.98
Prothrombin Time: 13 seconds (ref 11.4–15.2)

## 2016-05-09 MED ORDER — LIDOCAINE HCL 1 % IJ SOLN
INTRAMUSCULAR | Status: AC
Start: 2016-05-09 — End: 2016-05-09
  Filled 2016-05-09: qty 20

## 2016-05-09 MED ORDER — FENTANYL CITRATE (PF) 100 MCG/2ML IJ SOLN
INTRAMUSCULAR | Status: AC
  Filled 2016-05-09: qty 2

## 2016-05-09 MED ORDER — ONDANSETRON HCL 4 MG/2ML IJ SOLN
INTRAMUSCULAR | Status: AC
  Filled 2016-05-09: qty 2

## 2016-05-09 MED ORDER — ONDANSETRON HCL 4 MG/2ML IJ SOLN
INTRAMUSCULAR | Status: AC | PRN
  Administered 2016-05-09: 4 mg via INTRAVENOUS

## 2016-05-09 MED ORDER — SODIUM CHLORIDE 0.9 % IV SOLN
INTRAVENOUS | Status: DC
  Administered 2016-05-09: 08:00:00 via INTRAVENOUS

## 2016-05-09 MED ORDER — LIDOCAINE-EPINEPHRINE (PF) 2 %-1:200000 IJ SOLN
INTRAMUSCULAR | Status: AC
  Filled 2016-05-09: qty 20

## 2016-05-09 MED ORDER — NALOXONE HCL 0.4 MG/ML IJ SOLN
INTRAMUSCULAR | Status: AC
  Filled 2016-05-09: qty 1

## 2016-05-09 MED ORDER — LIDOCAINE HCL (PF) 1 % IJ SOLN
INTRAMUSCULAR | Status: AC
  Filled 2016-05-09: qty 30

## 2016-05-09 MED ORDER — CEFAZOLIN SODIUM-DEXTROSE 2-4 GM/100ML-% IV SOLN
2.0000 g | Freq: Once | INTRAVENOUS | Status: AC
Start: 2016-05-09 — End: 2016-05-09
  Administered 2016-05-09: 2 g via INTRAVENOUS
  Filled 2016-05-09: qty 100

## 2016-05-09 MED ORDER — MIDAZOLAM HCL 2 MG/2ML IJ SOLN
INTRAMUSCULAR | Status: AC | PRN
  Administered 2016-05-09: .25 mg via INTRAVENOUS
  Administered 2016-05-09: 0.5 mg via INTRAVENOUS
  Administered 2016-05-09: .25 mg via INTRAVENOUS

## 2016-05-09 MED ORDER — FENTANYL CITRATE (PF) 100 MCG/2ML IJ SOLN
INTRAMUSCULAR | Status: AC | PRN
  Administered 2016-05-09: 25 ug via INTRAVENOUS
  Administered 2016-05-09 (×2): 12.5 ug via INTRAVENOUS

## 2016-05-09 MED ORDER — FLUMAZENIL 0.5 MG/5ML IV SOLN
INTRAVENOUS | Status: AC
  Filled 2016-05-09: qty 5

## 2016-05-09 MED ORDER — MIDAZOLAM HCL 2 MG/2ML IJ SOLN
INTRAMUSCULAR | Status: AC
  Filled 2016-05-09: qty 2

## 2016-05-09 MED ORDER — LIDOCAINE HCL 1 % IJ SOLN
INTRAMUSCULAR | Status: AC | PRN
  Administered 2016-05-09: 5 mL via INTRADERMAL

## 2016-05-09 MED ORDER — LIDOCAINE-EPINEPHRINE (PF) 2 %-1:200000 IJ SOLN
INTRAMUSCULAR | Status: AC | PRN
  Administered 2016-05-09: 3 mL via INTRADERMAL

## 2016-05-09 MED ORDER — HEPARIN SOD (PORK) LOCK FLUSH 100 UNIT/ML IV SOLN
INTRAVENOUS | Status: AC
  Filled 2016-05-09: qty 5

## 2016-05-09 NOTE — Telephone Encounter (Signed)
Spoke with pt to confirm lab appt per LOS 8/30

## 2016-05-09 NOTE — Consult Note (Signed)
Chief Complaint: Patient was seen in consultation today for port a cath placement  Referring Physician(s): [place]  Supervising Physician: Jacqulynn Cadet  Patient Status: Outpatient  History of Present Illness: Jessica Sherman is a 82 y.o. female with recently diagnosed left breast cancer who presents today for Port-A-Cath placement for chemotherapy.  Past Medical History:  Diagnosis Date  . Hx of colonic polyps   . Hyperlipidemia   . Hypothyroidism   . Macular degeneration, dry   . Osteopenia    spine BMD T score - 1.69  . Prediabetes    FBS 136  on 08/29/2010; A1c 6.2%  . Vertigo     Past Surgical History:  Procedure Laterality Date  . CATARACT EXTRACTION  2009   OS  . COLONOSCOPY W/ POLYPECTOMY     X 2; last 2012. Dr Watt Climes  . PILONIDAL CYST EXCISION     X 3  . TONSILLECTOMY AND ADENOIDECTOMY    . TOTAL ABDOMINAL HYSTERECTOMY     w/ bladder repair    Allergies: Other  Medications: Prior to Admission medications   Medication Sig Start Date End Date Taking? Authorizing Provider  Calcium Carb-Cholecalciferol (CALCIUM 600+D3 PO) Take 1 tablet by mouth 2 (two) times daily.   Yes Historical Provider, MD  Cholecalciferol (VITAMIN D3) 1000 UNITS CAPS Take 1,000 Units by mouth daily.    Yes Historical Provider, MD  levothyroxine (SYNTHROID, LEVOTHROID) 88 MCG tablet TAKE 1 TABLET BY MOUTH DAILY EXCEPT TAKE 1/2 TABLET EVERY WEDNESDAY; MUST HAVE LABS FOR FURTHER REFILLS Patient taking differently: TAKE 1 TABLET (88 mcg) BY MOUTH DAILY EXCEPT TAKE 1/2 TABLET (44 mcg) EVERY WEDNESDAY; MUST HAVE LABS FOR FURTHER REFILLS 12/27/15  Yes [name], MD  meclizine (ANTIVERT) 25 MG tablet TAKE 1/2 TABLET BY MOUTH THREE TIMES DAILY AS NEEDED Patient taking differently: TAKE 1/2 TABLET (12.5 mg) BY MOUTH THREE TIMES DAILY AS NEEDED for vertigo 03/15/16  Yes [name], MD  Multiple Vitamins-Minerals (CENTRUM SILVER PO) Take 1 tablet by mouth daily.    Yes  Historical Provider, MD  Multiple Vitamins-Minerals (PRESERVISION/LUTEIN) CAPS Take 1 capsule by mouth 2 (two) times daily.    Yes Historical Provider, MD  simvastatin (ZOCOR) 40 MG tablet Take 20 mg by mouth every evening.   Yes Historical Provider, MD  lidocaine-prilocaine (EMLA) cream Apply to affected area once 05/01/16   [name], MD  metoprolol tartrate (LOPRESSOR) 25 MG tablet 1 bid if BP averages > 140/90 on average Patient taking differently: Take 25 mg by mouth 2 (two) times daily as needed (If BP averages > 140/90).  06/16/13   Hendricks Limes, MD  ondansetron (ZOFRAN) 8 MG tablet Take 1 tablet (8 mg total) by mouth 2 (two) times daily as needed (Nausea or vomiting). 05/01/16   [name], MD  prochlorperazine (COMPAZINE) 10 MG tablet Take 1 tablet (10 mg total) by mouth every 6 (six) hours as needed (Nausea or vomiting). 05/01/16   [name], MD  simvastatin (ZOCOR) 40 MG tablet TAKE 1 TABLET BY MOUTH DAILY 02/18/16   [name], MD     Family History  Problem Relation Age of Onset  . Osteoporosis Mother   . Leukemia Mother   . Cancer Maternal Uncle     ? stomach  . Aneurysm Father     AAA  . Heart attack Neg Hx     Social History   Social History  . Marital status: Married    Spouse name:  N/A  . Number of children: N/A  . Years of education: N/A   Occupational History  . retired    Social History Main Topics  . Smoking status: Never Smoker  . Smokeless tobacco: Never Used  . Alcohol use No     Comment:    . Drug use: No  . Sexual activity: Not Asked   Other Topics Concern  . None   Social History Narrative   Regular exercise- yes 71mpd w/o symptoms      Review of Systems currently denies fever, headache, chest pain, dyspnea, cough, abdominal pain, back pain, nausea, vomiting or abnormal bleeding.  Vital Signs: BP (!) 190/67 (BP Location: Right Arm)   Pulse 73   Temp 97.8 F (36.6 C) (Oral)   Resp 16   Wt 113 lb 12.8 oz (51.6 kg)   SpO2 99%    BMI 20.81 kg/m   Physical Exam awake, alert. Chest clear to auscultation bilaterally. Heart with regular rate and rhythm. Abdomen soft, positive bowel sounds, nontender. Lower extremities with no edema  Mallampati Score:     Imaging: No results found.  Labs:  CBC:  Recent Labs  05/09/16 0750  WBC 8.8  HGB 12.8  HCT 38.7  PLT 291    COAGS:  Recent Labs  05/09/16 0750  INR 0.98  APTT 43*    BMP: No results for input(s): NA, K, CL, CO2, GLUCOSE, BUN, CALCIUM, CREATININE, GFRNONAA, GFRAA in the last 8760 hours.  Invalid input(s): CMP  LIVER FUNCTION TESTS: No results for input(s): BILITOT, AST, ALT, ALKPHOS, PROT, ALBUMIN in the last 8760 hours.  TUMOR MARKERS: No results for input(s): AFPTM, CEA, CA199, CHROMGRNA in the last 8760 hours.  Assessment and Plan: Patient with history of recently diagnosed left breast cancer/positive axillary lymph node ;presents today for Port-A-Cath placement for chemotherapy.Risks and benefits discussed with the patient/family including, but not limited to bleeding, infection, pneumothorax, or fibrin sheath development and need for additional procedures.All of the patient's questions were answered, patient is agreeable to proceed.Consent signed and in chart.     Thank you for this interesting consult.  I greatly enjoyed meeting Jessica Sherman and look forward to participating in their care.  A copy of this report was sent to the requesting provider on this date.  Electronically Signed: D. Rowe Robert 05/09/2016, 8:32 AM   I spent a total of 20 minutes   in face to face in clinical consultation, greater than 50% of which was counseling/coordinating care for port a cath placement

## 2016-05-09 NOTE — Discharge Instructions (Signed)
Implanted Port Home Guide °An implanted port is a type of central line that is placed under the skin. Central lines are used to provide IV access when treatment or nutrition needs to be given through a person's veins. Implanted ports are used for long-term IV access. An implanted port may be placed because:  °· You need IV medicine that would be irritating to the small veins in your hands or arms.   °· You need long-term IV medicines, such as antibiotics.   °· You need IV nutrition for a long period.   °· You need frequent blood draws for lab tests.   °· You need dialysis.   °Implanted ports are usually placed in the chest area, but they can also be placed in the upper arm, the abdomen, or the leg. An implanted port has two main parts:  °· Reservoir. The reservoir is round and will appear as a small, raised area under your skin. The reservoir is the part where a needle is inserted to give medicines or draw blood.   °· Catheter. The catheter is a thin, flexible tube that extends from the reservoir. The catheter is placed into a large vein. Medicine that is inserted into the reservoir goes into the catheter and then into the vein.   °HOW WILL I CARE FOR MY INCISION SITE? °Do not get the incision site wet. Bathe or shower as directed by your health care provider.  °HOW IS MY PORT ACCESSED? °Special steps must be taken to access the port:  °· Before the port is accessed, a numbing cream can be placed on the skin. This helps numb the skin over the port site.   °· Your health care provider uses a sterile technique to access the port. °· Your health care provider must put on a mask and sterile gloves. °· The skin over your port is cleaned carefully with an antiseptic and allowed to dry. °· The port is gently pinched between sterile gloves, and a needle is inserted into the port. °· Only "non-coring" port needles should be used to access the port. Once the port is accessed, a blood return should be checked. This helps  ensure that the port is in the vein and is not clogged.   °· If your port needs to remain accessed for a constant infusion, a clear (transparent) bandage will be placed over the needle site. The bandage and needle will need to be changed every week, or as directed by your health care provider.   °· Keep the bandage covering the needle clean and dry. Do not get it wet. Follow your health care provider's instructions on how to take a shower or bath while the port is accessed.   °· If your port does not need to stay accessed, no bandage is needed over the port.   °WHAT IS FLUSHING? °Flushing helps keep the port from getting clogged. Follow your health care provider's instructions on how and when to flush the port. Ports are usually flushed with saline solution or a medicine called heparin. The need for flushing will depend on how the port is used.  °· If the port is used for intermittent medicines or blood draws, the port will need to be flushed:   °· After medicines have been given.   °· After blood has been drawn.   °· As part of routine maintenance.   °· If a constant infusion is running, the port may not need to be flushed.   °HOW LONG WILL MY PORT STAY IMPLANTED? °The port can stay in for as long as your health care   provider thinks it is needed. When it is time for the port to come out, surgery will be done to remove it. The procedure is similar to the one performed when the port was put in.  °WHEN SHOULD I SEEK IMMEDIATE MEDICAL CARE? °When you have an implanted port, you should seek immediate medical care if:  °· You notice a bad smell coming from the incision site.   °· You have swelling, redness, or drainage at the incision site.   °· You have more swelling or pain at the port site or the surrounding area.   °· You have a fever that is not controlled with medicine. °  °This information is not intended to replace advice given to you by your health care provider. Make sure you discuss any questions you have with  your health care provider. °  °Document Released: 08/28/2005 Document Revised: 06/18/2013 Document Reviewed: 05/05/2013 °Elsevier Interactive Patient Education ©2016 [employer] ° °Moderate Conscious Sedation, Adult, Care After °Refer to this sheet in the next few weeks. These instructions provide you with information on caring for yourself after your procedure. Your health care provider may also give you more specific instructions. Your treatment has been planned according to current medical practices, but problems sometimes occur. Call your health care provider if you have any problems or questions after your procedure. °WHAT TO EXPECT AFTER THE PROCEDURE  °After your procedure: °· You may feel sleepy, clumsy, and have poor balance for several hours. °· Vomiting may occur if you eat too soon after the procedure. °HOME CARE INSTRUCTIONS °· Do not participate in any activities where you could become injured for at least 24 hours. Do not: °¨ Drive. °¨ Swim. °¨ Ride a bicycle. °¨ Operate heavy machinery. °¨ Cook. °¨ Use power tools. °¨ Climb ladders. °¨ Work from a high place. °· Do not make important decisions or sign legal documents until you are improved. °· If you vomit, drink water, juice, or soup when you can drink without vomiting. Make sure you have little or no nausea before eating solid foods. °· Only take over-the-counter or prescription medicines for pain, discomfort, or fever as directed by your health care provider. °· Make sure you and your family fully understand everything about the medicines given to you, including what side effects may occur. °· You should not drink alcohol, take sleeping pills, or take medicines that cause drowsiness for at least 24 hours. °· If you smoke, do not smoke without supervision. °· If you are feeling better, you may resume normal activities 24 hours after you were sedated. °· Keep all appointments with your health care provider. °SEEK MEDICAL CARE IF: °· Your skin is  pale or bluish in color. °· You continue to feel nauseous or vomit. °· Your pain is getting worse and is not helped by medicine. °· You have bleeding or swelling. °· You are still sleepy or feeling clumsy after 24 hours. °SEEK IMMEDIATE MEDICAL CARE IF: °· You develop a rash. °· You have difficulty breathing. °· You develop any type of allergic problem. °· You have a fever. °MAKE SURE YOU: °· Understand these instructions. °· Will watch your condition. °· Will get help right away if you are not doing well or get worse. °  °This information is not intended to replace advice given to you by your health care provider. Make sure you discuss any questions you have with your health care provider. °  °Document Released: 06/18/2013 Document Revised: 09/18/2014 Document Reviewed: 06/18/2013 °Elsevier Interactive Patient   Education ©2016 [employer] ° °

## 2016-05-09 NOTE — Procedures (Signed)
Interventional Radiology Procedure Note  Procedure: Placement of a right IJ approach single lumen PowerPort.  Tip is positioned at the superior cavoatrial junction and catheter is ready for immediate use.  Complications: No immediate Recommendations:  - Ok to shower tomorrow - Do not submerge for 7 days - Routine line care   Signed,  Heath K. McCullough, MD   

## 2016-05-09 NOTE — Telephone Encounter (Signed)
Received call from IR per pt request.  Caller states pt wishes to reschedule chemo education class from today at 12pm to tomorrow in conjunction with scans.  Called Ursula Beath, RN to let her know of cancellation for today and to check on availability for tomorrow.  Alleta, RN states she can see pt for class at 2pm tomorrow following bone scan.  Called to discuss with pt who is in IR for [facility] placement.  Pt's daughter answered and this information was discussed.  Pt's daughter without further questions or concerns at time of call and encouraged to contact us should she need additional assistance.

## 2016-05-10 ENCOUNTER — Ambulatory Visit: Admit: 2016-05-10 | Payer: PPO | Admitting: Surgery

## 2016-05-10 ENCOUNTER — Encounter (HOSPITAL_COMMUNITY)
Admission: RE | Admit: 2016-05-10 | Discharge: 2016-05-10 | Disposition: A | Discharge status: Home / Self Care | Payer: PPO | Source: Ambulatory Visit | Attending: Hematology and Oncology | Admitting: Hematology and Oncology

## 2016-05-10 ENCOUNTER — Encounter (HOSPITAL_COMMUNITY): Payer: Self-pay

## 2016-05-10 ENCOUNTER — Ambulatory Visit (HOSPITAL_COMMUNITY)
Admission: RE | Admit: 2016-05-10 | Discharge: 2016-05-10 | Disposition: A | Discharge status: Home / Self Care | Payer: PPO | Source: Ambulatory Visit | Attending: Hematology and Oncology | Admitting: Hematology and Oncology

## 2016-05-10 ENCOUNTER — Telehealth: Payer: Self-pay | Admitting: Hematology and Oncology

## 2016-05-10 ENCOUNTER — Other Ambulatory Visit: Payer: PPO

## 2016-05-10 ENCOUNTER — Encounter: Payer: Self-pay | Admitting: *Deleted

## 2016-05-10 ENCOUNTER — Other Ambulatory Visit (HOSPITAL_BASED_OUTPATIENT_CLINIC_OR_DEPARTMENT_OTHER): Payer: PPO

## 2016-05-10 DIAGNOSIS — C50412 Malignant neoplasm of upper-outer quadrant of left female breast: Secondary | ICD-10-CM

## 2016-05-10 DIAGNOSIS — C50812 Malignant neoplasm of overlapping sites of left female breast: Secondary | ICD-10-CM

## 2016-05-10 DIAGNOSIS — C50912 Malignant neoplasm of unspecified site of left female breast: Secondary | ICD-10-CM | POA: Diagnosis not present

## 2016-05-10 DIAGNOSIS — N8189 Other female genital prolapse: Secondary | ICD-10-CM | POA: Diagnosis not present

## 2016-05-10 DIAGNOSIS — I700 Atherosclerosis of aorta: Secondary | ICD-10-CM | POA: Insufficient documentation

## 2016-05-10 LAB — CBC WITH DIFFERENTIAL/PLATELET
BASO%: 0.6 % (ref 0.0–2.0)
Basophils Absolute: 0 10*3/uL (ref 0.0–0.1)
EOS%: 1.5 % (ref 0.0–7.0)
Eosinophils Absolute: 0.1 10*3/uL (ref 0.0–0.5)
HCT: 37.7 % (ref 34.8–46.6)
HGB: 12.5 g/dL (ref 11.6–15.9)
LYMPH#: 1.9 10*3/uL (ref 0.9–3.3)
LYMPH%: 22.8 % (ref 14.0–49.7)
MCH: 31.8 pg (ref 25.1–34.0)
MCHC: 33.1 g/dL (ref 31.5–36.0)
MCV: 96.3 fL (ref 79.5–101.0)
MONO#: 0.8 10*3/uL (ref 0.1–0.9)
MONO%: 10.1 % (ref 0.0–14.0)
NEUT%: 65 % (ref 38.4–76.8)
NEUTROS ABS: 5.5 10*3/uL (ref 1.5–6.5)
Platelets: 269 10*3/uL (ref 145–400)
RBC: 3.92 10*6/uL (ref 3.70–5.45)
RDW: 12.9 % (ref 11.2–14.5)
WBC: 8.4 10*3/uL (ref 3.9–10.3)

## 2016-05-10 LAB — COMPREHENSIVE METABOLIC PANEL
ALT: 17 U/L (ref 0–55)
AST: 25 U/L (ref 5–34)
Albumin: 3.8 g/dL (ref 3.5–5.0)
Alkaline Phosphatase: 67 U/L (ref 40–150)
Anion Gap: 11 mEq/L (ref 3–11)
BILIRUBIN TOTAL: 0.41 mg/dL (ref 0.20–1.20)
BUN: 13.9 mg/dL (ref 7.0–26.0)
CO2: 27 meq/L (ref 22–29)
CREATININE: 0.9 mg/dL (ref 0.6–1.1)
Calcium: 9.8 mg/dL (ref 8.4–10.4)
Chloride: 99 mEq/L (ref 98–109)
EGFR: 63 mL/min/{1.73_m2} — AB (ref >90)
GLUCOSE: 93 mg/dL (ref 70–140)
POTASSIUM: 4.1 meq/L (ref 3.5–5.1)
SODIUM: 138 meq/L (ref 136–145)
Total Protein: 7.9 g/dL (ref 6.4–8.3)

## 2016-05-10 SURGERY — MASTECTOMY, MODIFIED RADICAL
Anesthesia: General | Site: Breast

## 2016-05-10 MED ORDER — TECHNETIUM TC 99M MEDRONATE IV KIT
22.0000 | PACK | Freq: Once | INTRAVENOUS | Status: AC | PRN
  Administered 2016-05-10: 22 via INTRAVENOUS

## 2016-05-10 MED ORDER — IOPAMIDOL (ISOVUE-300) INJECTION 61%
100.0000 mL | Freq: Once | INTRAVENOUS | Status: AC | PRN
  Administered 2016-05-10: 100 mL via INTRAVENOUS

## 2016-05-10 NOTE — Telephone Encounter (Signed)
I called and left a message with the patient's granddaughter that the CT scans and bone scans do not show any evidence of metastatic disease.

## 2016-05-12 ENCOUNTER — Ambulatory Visit
Admission: RE | Admit: 2016-05-12 | Discharge: 2016-05-12 | Disposition: A | Discharge status: Home / Self Care | Payer: PPO | Source: Ambulatory Visit | Attending: Hematology and Oncology | Admitting: Hematology and Oncology

## 2016-05-12 DIAGNOSIS — C50412 Malignant neoplasm of upper-outer quadrant of left female breast: Secondary | ICD-10-CM

## 2016-05-12 DIAGNOSIS — C50912 Malignant neoplasm of unspecified site of left female breast: Secondary | ICD-10-CM | POA: Diagnosis not present

## 2016-05-12 MED ORDER — GADOBENATE DIMEGLUMINE 529 MG/ML IV SOLN
10.0000 mL | Freq: Once | INTRAVENOUS | Status: AC | PRN
  Administered 2016-05-12: 10 mL via INTRAVENOUS

## 2016-05-17 ENCOUNTER — Ambulatory Visit: Payer: PPO

## 2016-05-17 ENCOUNTER — Other Ambulatory Visit (HOSPITAL_BASED_OUTPATIENT_CLINIC_OR_DEPARTMENT_OTHER): Payer: PPO

## 2016-05-17 ENCOUNTER — Other Ambulatory Visit: Payer: Self-pay | Admitting: Hematology and Oncology

## 2016-05-17 ENCOUNTER — Ambulatory Visit (HOSPITAL_BASED_OUTPATIENT_CLINIC_OR_DEPARTMENT_OTHER): Payer: PPO

## 2016-05-17 ENCOUNTER — Encounter: Payer: Self-pay | Admitting: Hematology and Oncology

## 2016-05-17 ENCOUNTER — Ambulatory Visit (HOSPITAL_BASED_OUTPATIENT_CLINIC_OR_DEPARTMENT_OTHER): Payer: PPO | Admitting: Hematology and Oncology

## 2016-05-17 ENCOUNTER — Encounter: Payer: Self-pay | Admitting: *Deleted

## 2016-05-17 VITALS — BP 178/88 | HR 52 | Temp 97.9°F | Resp 20

## 2016-05-17 DIAGNOSIS — C50412 Malignant neoplasm of upper-outer quadrant of left female breast: Secondary | ICD-10-CM | POA: Diagnosis not present

## 2016-05-17 DIAGNOSIS — C773 Secondary and unspecified malignant neoplasm of axilla and upper limb lymph nodes: Secondary | ICD-10-CM | POA: Diagnosis not present

## 2016-05-17 DIAGNOSIS — Z5112 Encounter for antineoplastic immunotherapy: Secondary | ICD-10-CM | POA: Diagnosis not present

## 2016-05-17 DIAGNOSIS — Z5111 Encounter for antineoplastic chemotherapy: Secondary | ICD-10-CM

## 2016-05-17 LAB — COMPREHENSIVE METABOLIC PANEL
ALT: 21 U/L (ref 0–55)
AST: 27 U/L (ref 5–34)
Albumin: 3.5 g/dL (ref 3.5–5.0)
Alkaline Phosphatase: 59 U/L (ref 40–150)
Anion Gap: 9 mEq/L (ref 3–11)
BUN: 15.2 mg/dL (ref 7.0–26.0)
CHLORIDE: 100 meq/L (ref 98–109)
CO2: 28 mEq/L (ref 22–29)
Calcium: 10.3 mg/dL (ref 8.4–10.4)
Creatinine: 0.8 mg/dL (ref 0.6–1.1)
EGFR: 73 mL/min/{1.73_m2} — AB (ref >90)
GLUCOSE: 96 mg/dL (ref 70–140)
POTASSIUM: 4.4 meq/L (ref 3.5–5.1)
SODIUM: 137 meq/L (ref 136–145)
Total Bilirubin: 0.31 mg/dL (ref 0.20–1.20)
Total Protein: 7.2 g/dL (ref 6.4–8.3)

## 2016-05-17 LAB — CBC WITH DIFFERENTIAL/PLATELET
BASO%: 0.7 % (ref 0.0–2.0)
BASOS ABS: 0.1 10*3/uL (ref 0.0–0.1)
EOS%: 1.4 % (ref 0.0–7.0)
Eosinophils Absolute: 0.1 10*3/uL (ref 0.0–0.5)
HCT: 35.8 % (ref 34.8–46.6)
HGB: 11.9 g/dL (ref 11.6–15.9)
LYMPH%: 20.9 % (ref 14.0–49.7)
MCH: 31.7 pg (ref 25.1–34.0)
MCHC: 33.2 g/dL (ref 31.5–36.0)
MCV: 95.6 fL (ref 79.5–101.0)
MONO#: 1 10*3/uL — ABNORMAL HIGH (ref 0.1–0.9)
MONO%: 11.8 % (ref 0.0–14.0)
NEUT#: 5.4 10*3/uL (ref 1.5–6.5)
NEUT%: 65.2 % (ref 38.4–76.8)
Platelets: 236 10*3/uL (ref 145–400)
RBC: 3.74 10*6/uL (ref 3.70–5.45)
RDW: 12.9 % (ref 11.2–14.5)
WBC: 8.3 10*3/uL (ref 3.9–10.3)
lymph#: 1.7 10*3/uL (ref 0.9–3.3)

## 2016-05-17 MED ORDER — FAMOTIDINE IN NACL 20-0.9 MG/50ML-% IV SOLN
20.0000 mg | Freq: Once | INTRAVENOUS | Status: AC
  Administered 2016-05-17: 20 mg via INTRAVENOUS

## 2016-05-17 MED ORDER — ACETAMINOPHEN 325 MG PO TABS
ORAL_TABLET | ORAL | Status: AC
  Filled 2016-05-17: qty 2

## 2016-05-17 MED ORDER — SODIUM CHLORIDE 0.9 % IV SOLN
840.0000 mg | Freq: Once | INTRAVENOUS | Status: AC
  Administered 2016-05-17: 840 mg via INTRAVENOUS
  Filled 2016-05-17: qty 28

## 2016-05-17 MED ORDER — SODIUM CHLORIDE 0.9 % IV SOLN
Freq: Once | INTRAVENOUS | Status: AC
  Administered 2016-05-17: 11:00:00 via INTRAVENOUS

## 2016-05-17 MED ORDER — DIPHENHYDRAMINE HCL 50 MG/ML IJ SOLN
INTRAMUSCULAR | Status: AC
  Filled 2016-05-17: qty 1

## 2016-05-17 MED ORDER — FAMOTIDINE IN NACL 20-0.9 MG/50ML-% IV SOLN
INTRAVENOUS | Status: AC
  Filled 2016-05-17: qty 50

## 2016-05-17 MED ORDER — DIPHENHYDRAMINE HCL 12.5 MG/5ML PO ELIX
12.5000 mg | ORAL_SOLUTION | Freq: Once | ORAL | Status: AC
  Administered 2016-05-17: 12.5 mg via ORAL
  Filled 2016-05-17: qty 5

## 2016-05-17 MED ORDER — SODIUM CHLORIDE 0.9% FLUSH
10.0000 mL | INTRAVENOUS | Status: DC | PRN
  Administered 2016-05-17: 10 mL
  Filled 2016-05-17: qty 10

## 2016-05-17 MED ORDER — TRASTUZUMAB CHEMO 150 MG IV SOLR
8.0000 mg/kg | Freq: Once | INTRAVENOUS | Status: AC
  Administered 2016-05-17: 420 mg via INTRAVENOUS
  Filled 2016-05-17: qty 20

## 2016-05-17 MED ORDER — DIPHENHYDRAMINE HCL 50 MG/ML IJ SOLN
12.5000 mg | Freq: Once | INTRAMUSCULAR | Status: AC
Start: 2016-05-17 — End: 2016-05-17
  Administered 2016-05-17: 12.5 mg via INTRAVENOUS

## 2016-05-17 MED ORDER — ACETAMINOPHEN 325 MG PO TABS
650.0000 mg | ORAL_TABLET | Freq: Once | ORAL | Status: AC
Start: 2016-05-17 — End: 2016-05-17
  Administered 2016-05-17: 650 mg via ORAL

## 2016-05-17 MED ORDER — METOPROLOL TARTRATE 50 MG PO TABS
25.0000 mg | ORAL_TABLET | Freq: Once | ORAL | Status: AC
  Administered 2016-05-17: 25 mg via ORAL
  Filled 2016-05-17: qty 0.5

## 2016-05-17 MED ORDER — SODIUM CHLORIDE 0.9 % IV SOLN
50.0000 mg/m2 | Freq: Once | INTRAVENOUS | Status: AC
  Administered 2016-05-17: 78 mg via INTRAVENOUS
  Filled 2016-05-17: qty 13

## 2016-05-17 MED ORDER — HEPARIN SOD (PORK) LOCK FLUSH 100 UNIT/ML IV SOLN
500.0000 [IU] | Freq: Once | INTRAVENOUS | Status: AC | PRN
  Administered 2016-05-17: 500 [IU]
  Filled 2016-05-17: qty 5

## 2016-05-17 NOTE — Progress Notes (Signed)
[employer] prior auth for ondansetron sent via covermymeds- sent notes for appeal-denied per [employer] per [employer] it was approved and processed as paid until 09/10/16- sent to medical recds-ondansetron

## 2016-05-17 NOTE — Progress Notes (Signed)
Patient Care Team: [name], MD as PCP - General (Internal Medicine)  SUMMARY OF ONCOLOGIC HISTORY:   Breast cancer of upper-outer quadrant of left female breast ([place])   04/12/2016 Initial Diagnosis    Left breast biopsy: IDC grade 2-3, left axillary LN positive for metastatic IDC; mammogram revealed a 5 cm solid mass left breast 12:00 anterior depth      05/10/2016 Imaging    Bone scan normal; CT CAP: left breast cancer, left axillary and subpectoral nodes are not pathologic by size criteriabut suspicious, no metastatic disease      05/16/2016 Breast MRI    Left breast solid with cystic appearance OUQ 1:00 crossing into the medial left breast 4.7 x 3.3 x 4.2 cm; second 66m nodule left breast UOQ: Posterior intramammary/low axillary lymph node, indeterminate left axillary lymph nodes, skin thickening      05/17/2016 -  Neo-Adjuvant Chemotherapy    Taxol weekly 12 Herceptin and Perjeta every 3 weeks 6       CHIEF COMPLIANT: cycle 1 Taxol Herceptin Perjeta  INTERVAL HISTORY: Jessica STUMis a 82year old with above-mentioned history left breast cancer that is HER-2 positive who underwent CT scans bone scan and breast MRI and is here today to start first cycle of neoadjuvant chemotherapy. The scans did not show any evidence of metastatic disease. She does have inflammatory changes in the breast. Her echocardiogram was normal. She appears to be fairly anxious today.  REVIEW OF SYSTEMS:   Constitutional: Denies fevers, chills or abnormal weight loss Eyes: Denies blurriness of vision Ears, nose, mouth, throat, and face: Denies mucositis or sore throat Respiratory: Denies cough, dyspnea or wheezes Cardiovascular: Denies palpitation, chest discomfort Gastrointestinal:  Denies nausea, heartburn or change in bowel habits Skin: Denies abnormal skin rashes Lymphatics: Denies new lymphadenopathy or easy bruising Neurological:Denies numbness, tingling or new  weaknesses Behavioral/Psych: Mood is stable, no new changes  Extremities: No lower extremity edema Breast: large left breast mass All other systems were reviewed with the patient and are negative.  I have reviewed the past medical history, past surgical history, social history and family history with the patient and they are unchanged from previous note.  ALLERGIES:  is allergic to other.  MEDICATIONS:  Current Outpatient Prescriptions  Medication Sig Dispense Refill  . Calcium Carb-Cholecalciferol (CALCIUM 600+D3 PO) Take 1 tablet by mouth 2 (two) times daily.    . Cholecalciferol (VITAMIN D3) 1000 UNITS CAPS Take 1,000 Units by mouth daily.     .Marland Kitchenlevothyroxine (SYNTHROID, LEVOTHROID) 88 MCG tablet TAKE 1 TABLET BY MOUTH DAILY EXCEPT TAKE 1/2 TABLET EVERY WEDNESDAY; MUST HAVE LABS FOR FURTHER REFILLS (Patient taking differently: TAKE 1 TABLET (88 mcg) BY MOUTH DAILY EXCEPT TAKE 1/2 TABLET (44 mcg) EVERY WEDNESDAY; MUST HAVE LABS FOR FURTHER REFILLS) 30 tablet 5  . lidocaine-prilocaine (EMLA) cream Apply to affected area once 30 g 3  . meclizine (ANTIVERT) 25 MG tablet TAKE 1/2 TABLET BY MOUTH THREE TIMES DAILY AS NEEDED (Patient taking differently: TAKE 1/2 TABLET (12.5 mg) BY MOUTH THREE TIMES DAILY AS NEEDED for vertigo) 15 tablet 2  . metoprolol tartrate (LOPRESSOR) 25 MG tablet 1 bid if BP averages > 140/90 on average (Patient taking differently: Take 25 mg by mouth 2 (two) times daily as needed (If BP averages > 140/90). ) 60 tablet 2  . Multiple Vitamins-Minerals (CENTRUM SILVER PO) Take 1 tablet by mouth daily.     . Multiple Vitamins-Minerals (PRESERVISION/LUTEIN) CAPS Take 1 capsule by mouth 2 (  two) times daily.     . ondansetron (ZOFRAN) 8 MG tablet Take 1 tablet (8 mg total) by mouth 2 (two) times daily as needed (Nausea or vomiting). 30 tablet 1  . prochlorperazine (COMPAZINE) 10 MG tablet Take 1 tablet (10 mg total) by mouth every 6 (six) hours as needed (Nausea or vomiting). 30  tablet 1  . simvastatin (ZOCOR) 40 MG tablet Take 20 mg by mouth every evening.    . simvastatin (ZOCOR) 40 MG tablet TAKE 1 TABLET BY MOUTH DAILY 90 tablet 1   No current facility-administered medications for this visit.     PHYSICAL EXAMINATION: ECOG PERFORMANCE STATUS: 1 - Symptomatic but completely ambulatory  Vitals:   05/17/16 1001  BP: (!) 194/90  Pulse: 72  Resp: 18  Temp: 97.9 F (36.6 C)   Filed Weights   05/17/16 1001  Weight: 116 lb 3.2 oz (52.7 kg)    GENERAL:alert, no distress and comfortable SKIN: skin color, texture, turgor are normal, no rashes or significant lesions EYES: normal, Conjunctiva are pink and non-injected, sclera clear OROPHARYNX:no exudate, no erythema and lips, buccal mucosa, and tongue normal  NECK: supple, thyroid normal size, non-tender, without nodularity LYMPH:  no palpable lymphadenopathy in the cervical, axillary or inguinal LUNGS: clear to auscultation and percussion with normal breathing effort HEART: regular rate & rhythm and no murmurs and no lower extremity edema ABDOMEN:abdomen soft, non-tender and normal bowel sounds MUSCULOSKELETAL:no cyanosis of digits and no clubbing  NEURO: alert & oriented x 3 with fluent speech, no focal motor/sensory deficits EXTREMITIES: No lower extremity edema  LABORATORY DATA:  I have reviewed the data as listed   Chemistry      Component Value Date/Time   NA 137 05/17/2016 0924   K 4.4 05/17/2016 0924   CL 102 09/15/2014 1018   CO2 28 05/17/2016 0924   BUN 15.2 05/17/2016 0924   CREATININE 0.8 05/17/2016 0924      Component Value Date/Time   CALCIUM 10.3 05/17/2016 0924   ALKPHOS 59 05/17/2016 0924   AST 27 05/17/2016 0924   ALT 21 05/17/2016 0924   BILITOT 0.31 05/17/2016 0924       Lab Results  Component Value Date   WBC 8.3 05/17/2016   HGB 11.9 05/17/2016   HCT 35.8 05/17/2016   MCV 95.6 05/17/2016   PLT 236 05/17/2016   NEUTROABS 5.4 05/17/2016     ASSESSMENT & PLAN:   Breast cancer of upper-outer quadrant of left female breast ([place]) Left breast biopsy 04/12/2016: IDC grade 2-3, left axillary LN positive for metastatic IDC; mammogram revealed a 5 cm solid mass left breast 12:00 anterior depth.  Breast MRI: 05/16/2016:Left breast solid with cystic appearance OUQ 1:00 crossing into the medial left breast 4.7 x 3.3 x 4.2 cm; second 63m nodule left breast UOQ: Posterior intramammary/low axillary lymph node, indeterminate left axillary lymph nodes, skin thickening Bone scan and CT chest abdomen pelvis 05/10/2016: No metastatic disease Clinical stage: T2 N1 M0 stage IIB  Treatment plan: 1. Neoadjuvant chemotherapy with Taxol Herceptin Perjeta. Taxol to be given weekly 12, Herceptin Perjeta to be given every 3 weeks 6 cycles 2. Mastectomy with sentinel lymph node biopsy/selective axillary node dissection 3. +/- adjuvant radiation ------------------------------------------------------------------------------------------------------------------------------------------------------- Current treatment: Cycle 1 day 1 Taxol Herceptin Perjeta; Taxol being given weekly, Herceptin Perjeta every 3 weeks 6 cycles Echocardiogram 05/04/2016: EF 55-60% Antiemetics were reviewed. Labs were reviewed. Monitoring closely for toxicities. Return to clinic in one week for toxicity check.  No orders of the defined types were placed in this encounter.  The patient has a good understanding of the overall plan. she agrees with it. she will call with any problems that may develop before the next visit here.   Rulon Eisenmenger, MD 05/17/16

## 2016-05-17 NOTE — Assessment & Plan Note (Signed)
Left breast biopsy 04/12/2016: IDC grade 2-3, left axillary LN positive for metastatic IDC; mammogram revealed a 5 cm solid mass left breast 12:00 anterior depth.  Breast MRI: 05/16/2016:Left breast solid with cystic appearance OUQ 1:00 crossing into the medial left breast 4.7 x 3.3 x 4.2 cm; second 32mm nodule left breast UOQ: Posterior intramammary/low axillary lymph node, indeterminate left axillary lymph nodes, skin thickening Bone scan and CT chest abdomen pelvis 05/10/2016: No metastatic disease Clinical stage: T2 N1 M0 stage IIB  Treatment plan: 1. Neoadjuvant chemotherapy with Taxol Herceptin Perjeta. Taxol to be given weekly 12, Herceptin Perjeta to be given every 3 weeks 6 cycles 2. Mastectomy with sentinel lymph node biopsy/selective axillary node dissection 3. +/- adjuvant radiation ------------------------------------------------------------------------------------------------------------------------------------------------------- Current treatment: Cycle 1 day 1 Taxol Herceptin Perjeta; Taxol being given weekly, Herceptin Perjeta every 3 weeks 6 cycles Echocardiogram 05/04/2016: EF 55-60% Antiemetics were reviewed. Labs were reviewed. Monitoring closely for toxicities. Return to clinic in one week for toxicity check.

## 2016-05-17 NOTE — Patient Instructions (Signed)
[facility] Discharge Instructions for Patients Receiving Chemotherapy  Today you received the following chemotherapy agents Herceptin, Perjeta, Taxol  To help prevent nausea and vomiting after your treatment, we encourage you to take your nausea medication as directed.  If you develop nausea and vomiting that is not controlled by your nausea medication, call the clinic.   BELOW ARE SYMPTOMS THAT SHOULD BE REPORTED IMMEDIATELY:  *FEVER GREATER THAN 100.5 F  *CHILLS WITH OR WITHOUT FEVER  NAUSEA AND VOMITING THAT IS NOT CONTROLLED WITH YOUR NAUSEA MEDICATION  *UNUSUAL SHORTNESS OF BREATH  *UNUSUAL BRUISING OR BLEEDING  TENDERNESS IN MOUTH AND THROAT WITH OR WITHOUT PRESENCE OF ULCERS  *URINARY PROBLEMS  *BOWEL PROBLEMS  UNUSUAL RASH Items with * indicate a potential emergency and should be followed up as soon as possible.  Feel free to call the clinic you have any questions or concerns. The clinic phone number is [phone].  Please show the [facility] at check-in to the Emergency Department and triage nurse.    Trastuzumab injection for infusion What is this medicine? TRASTUZUMAB (tras TOO zoo mab) is a monoclonal antibody. It is used to treat breast cancer and stomach cancer. This medicine may be used for other purposes; ask your health care provider or pharmacist if you have questions. What should I tell my health care provider before I take this medicine? They need to know if you have any of these conditions: -heart disease -heart failure -infection (especially a virus infection such as chickenpox, cold sores, or herpes) -lung or breathing disease, like asthma -recent or ongoing radiation therapy -an unusual or allergic reaction to trastuzumab, benzyl alcohol, or other medications, foods, dyes, or preservatives -pregnant or trying to get pregnant -breast-feeding How should I use this medicine? This drug is given as an infusion into a vein.  It is administered in a hospital or clinic by a specially trained health care professional. Talk to your pediatrician regarding the use of this medicine in children. This medicine is not approved for use in children. Overdosage: If you think you have taken too much of this medicine contact a poison control center or emergency room at once. NOTE: This medicine is only for you. Do not share this medicine with others. What if I miss a dose? It is important not to miss a dose. Call your doctor or health care professional if you are unable to keep an appointment. What may interact with this medicine? -doxorubicin -warfarin This list may not describe all possible interactions. Give your health care provider a list of all the medicines, herbs, non-prescription drugs, or dietary supplements you use. Also tell them if you smoke, drink alcohol, or use illegal drugs. Some items may interact with your medicine. What should I watch for while using this medicine? Visit your doctor for checks on your progress. Report any side effects. Continue your course of treatment even though you feel ill unless your doctor tells you to stop. Call your doctor or health care professional for advice if you get a fever, chills or sore throat, or other symptoms of a cold or flu. Do not treat yourself. Try to avoid being around people who are sick. You may experience fever, chills and shaking during your first infusion. These effects are usually mild and can be treated with other medicines. Report any side effects during the infusion to your health care professional. Fever and chills usually do not happen with later infusions. Do not become pregnant while taking this  medicine or for 7 months after stopping it. Women should inform their doctor if they wish to become pregnant or think they might be pregnant. Women of child-bearing potential will need to have a negative pregnancy test before starting this medicine. There is a potential  for serious side effects to an unborn child. Talk to your health care professional or pharmacist for more information. Do not breast-feed an infant while taking this medicine or for 7 months after stopping it. Women must use effective birth control with this medicine. What side effects may I notice from receiving this medicine? Side effects that you should report to your doctor or other health care professional as soon as possible: -breathing difficulties -chest pain or palpitations -cough -dizziness or fainting -fever or chills, sore throat -skin rash, itching or hives -swelling of the legs or ankles -unusually weak or tired Side effects that usually do not require medical attention (report to your doctor or other health care professional if they continue or are bothersome): -loss of appetite -headache -muscle aches -nausea This list may not describe all possible side effects. Call your doctor for medical advice about side effects. You may report side effects to FDA at 1-800-FDA-1088. Where should I keep my medicine? This drug is given in a hospital or clinic and will not be stored at home. NOTE: This sheet is a summary. It may not cover all possible information. If you have questions about this medicine, talk to your doctor, pharmacist, or health care provider.    2016, Elsevier/[employer] Standard. (2014-12-04 11:49:32)      Pertuzumab injection What is this medicine? PERTUZUMAB (per TOOZ ue mab) is a monoclonal antibody. It is used to treat breast cancer. This medicine may be used for other purposes; ask your health care provider or pharmacist if you have questions. What should I tell my health care provider before I take this medicine? They need to know if you have any of these conditions: -heart disease -heart failure -high blood pressure -history of irregular heart beat -recent or ongoing radiation therapy -an unusual or allergic reaction to pertuzumab, other medicines,  foods, dyes, or preservatives -pregnant or trying to get pregnant -breast-feeding How should I use this medicine? This medicine is for infusion into a vein. It is given by a health care professional in a hospital or clinic setting. Talk to your pediatrician regarding the use of this medicine in children. Special care may be needed. Overdosage: If you think you have taken too much of this medicine contact a poison control center or emergency room at once. NOTE: This medicine is only for you. Do not share this medicine with others. What if I miss a dose? It is important not to miss your dose. Call your doctor or health care professional if you are unable to keep an appointment. What may interact with this medicine? Interactions are not expected. Give your health care provider a list of all the medicines, herbs, non-prescription drugs, or dietary supplements you use. Also tell them if you smoke, drink alcohol, or use illegal drugs. Some items may interact with your medicine. This list may not describe all possible interactions. Give your health care provider a list of all the medicines, herbs, non-prescription drugs, or dietary supplements you use. Also tell them if you smoke, drink alcohol, or use illegal drugs. Some items may interact with your medicine. What should I watch for while using this medicine? Your condition will be monitored carefully while you are receiving this medicine. Report any  side effects. Continue your course of treatment even though you feel ill unless your doctor tells you to stop. Do not become pregnant while taking this medicine or for 7 months after stopping it. Women should inform their doctor if they wish to become pregnant or think they might be pregnant. Women of child-bearing potential will need to have a negative pregnancy test before starting this medicine. There is a potential for serious side effects to an unborn child. Talk to your health care professional or  pharmacist for more information. Do not breast-feed an infant while taking this medicine or for 7 months after stopping it. Women must use effective birth control with this medicine. Call your doctor or health care professional for advice if you get a fever, chills or sore throat, or other symptoms of a cold or flu. Do not treat yourself. Try to avoid being around people who are sick. You may experience fever, chills, and headache during the infusion. Report any side effects during the infusion to your health care professional. What side effects may I notice from receiving this medicine? Side effects that you should report to your doctor or health care professional as soon as possible: -breathing problems -chest pain or palpitations -dizziness -feeling faint or lightheaded -fever or chills -skin rash, itching or hives -sore throat -swelling of the face, lips, or tongue -swelling of the legs or ankles -unusually weak or tired Side effects that usually do not require medical attention (Report these to your doctor or health care professional if they continue or are bothersome.): -diarrhea -hair loss -nausea, vomiting -tiredness This list may not describe all possible side effects. Call your doctor for medical advice about side effects. You may report side effects to FDA at 1-800-FDA-1088. Where should I keep my medicine? This drug is given in a hospital or clinic and will not be stored at home. NOTE: This sheet is a summary. It may not cover all possible information. If you have questions about this medicine, talk to your doctor, pharmacist, or health care provider.    2016, Elsevier/Gold Standard. (2014-12-04 16:07:57)    Paclitaxel injection What is this medicine? PACLITAXEL (PAK li TAX el) is a chemotherapy drug. It targets fast dividing cells, like cancer cells, and causes these cells to die. This medicine is used to treat ovarian cancer, breast cancer, and other cancers. This  medicine may be used for other purposes; ask your health care provider or pharmacist if you have questions. What should I tell my health care provider before I take this medicine? They need to know if you have any of these conditions: -blood disorders -irregular heartbeat -infection (especially a virus infection such as chickenpox, cold sores, or herpes) -liver disease -previous or ongoing radiation therapy -an unusual or allergic reaction to paclitaxel, alcohol, polyoxyethylated castor oil, other chemotherapy agents, other medicines, foods, dyes, or preservatives -pregnant or trying to get pregnant -breast-feeding How should I use this medicine? This drug is given as an infusion into a vein. It is administered in a hospital or clinic by a specially trained health care professional. Talk to your pediatrician regarding the use of this medicine in children. Special care may be needed. Overdosage: If you think you have taken too much of this medicine contact a poison control center or emergency room at once. NOTE: This medicine is only for you. Do not share this medicine with others. What if I miss a dose? It is important not to miss your dose. Call your doctor or health  care professional if you are unable to keep an appointment. What may interact with this medicine? Do not take this medicine with any of the following medications: -disulfiram -metronidazole This medicine may also interact with the following medications: -cyclosporine -diazepam -ketoconazole -medicines to increase blood counts like filgrastim, pegfilgrastim, sargramostim -other chemotherapy drugs like cisplatin, doxorubicin, epirubicin, etoposide, teniposide, vincristine -quinidine -testosterone -vaccines -verapamil Talk to your doctor or health care professional before taking any of these medicines: -acetaminophen -aspirin -ibuprofen -ketoprofen -naproxen This list may not describe all possible interactions. Give  your health care provider a list of all the medicines, herbs, non-prescription drugs, or dietary supplements you use. Also tell them if you smoke, drink alcohol, or use illegal drugs. Some items may interact with your medicine. What should I watch for while using this medicine? Your condition will be monitored carefully while you are receiving this medicine. You will need important blood work done while you are taking this medicine. This drug may make you feel generally unwell. This is not uncommon, as chemotherapy can affect healthy cells as well as cancer cells. Report any side effects. Continue your course of treatment even though you feel ill unless your doctor tells you to stop. This medicine can cause serious allergic reactions. To reduce your risk you will need to take other medicine(s) before treatment with this medicine. In some cases, you may be given additional medicines to help with side effects. Follow all directions for their use. Call your doctor or health care professional for advice if you get a fever, chills or sore throat, or other symptoms of a cold or flu. Do not treat yourself. This drug decreases your body's ability to fight infections. Try to avoid being around people who are sick. This medicine may increase your risk to bruise or bleed. Call your doctor or health care professional if you notice any unusual bleeding. Be careful brushing and flossing your teeth or using a toothpick because you may get an infection or bleed more easily. If you have any dental work done, tell your dentist you are receiving this medicine. Avoid taking products that contain aspirin, acetaminophen, ibuprofen, naproxen, or ketoprofen unless instructed by your doctor. These medicines may hide a fever. Do not become pregnant while taking this medicine. Women should inform their doctor if they wish to become pregnant or think they might be pregnant. There is a potential for serious side effects to an unborn  child. Talk to your health care professional or pharmacist for more information. Do not breast-feed an infant while taking this medicine. Men are advised not to father a child while receiving this medicine. This product may contain alcohol. Ask your pharmacist or healthcare provider if this medicine contains alcohol. Be sure to tell all healthcare providers you are taking this medicine. Certain medicines, like metronidazole and disulfiram, can cause an unpleasant reaction when taken with alcohol. The reaction includes flushing, headache, nausea, vomiting, sweating, and increased thirst. The reaction can last from 30 minutes to several hours. What side effects may I notice from receiving this medicine? Side effects that you should report to your doctor or health care professional as soon as possible: -allergic reactions like skin rash, itching or hives, swelling of the face, lips, or tongue -low blood counts - This drug may decrease the number of white blood cells, red blood cells and platelets. You may be at increased risk for infections and bleeding. -signs of infection - fever or chills, cough, sore throat, pain or difficulty passing urine -  signs of decreased platelets or bleeding - bruising, pinpoint red spots on the skin, black, tarry stools, nosebleeds -signs of decreased red blood cells - unusually weak or tired, fainting spells, lightheadedness -breathing problems -chest pain -high or low blood pressure -mouth sores -nausea and vomiting -pain, swelling, redness or irritation at the injection site -pain, tingling, numbness in the hands or feet -slow or irregular heartbeat -swelling of the ankle, feet, hands Side effects that usually do not require medical attention (report to your doctor or health care professional if they continue or are bothersome): -bone pain -complete hair loss including hair on your head, underarms, pubic hair, eyebrows, and eyelashes -changes in the color of  fingernails -diarrhea -loosening of the fingernails -loss of appetite -muscle or joint pain -red flush to skin -sweating This list may not describe all possible side effects. Call your doctor for medical advice about side effects. You may report side effects to FDA at 1-800-FDA-1088. Where should I keep my medicine? This drug is given in a hospital or clinic and will not be stored at home. NOTE: This sheet is a summary. It may not cover all possible information. If you have questions about this medicine, talk to your doctor, pharmacist, or health care provider.    2016, [employer]vier/Gold Standard. (2015-04-15 13:02:56)

## 2016-05-17 NOTE — Progress Notes (Signed)
Prior to start of treatment patients blood pressure was elevated at 198/83.  Per Dr. Lindi Adie give 25 mg of Metoprolol Immediate release.  Ok to proceed with treatment without waiting for response from metoprolol.  Reassessed blood pressure after 1 hour and blood pressure has decreased.

## 2016-05-18 ENCOUNTER — Telehealth: Payer: Self-pay | Admitting: *Deleted

## 2016-05-18 ENCOUNTER — Other Ambulatory Visit: Payer: Self-pay | Admitting: Internal Medicine

## 2016-05-18 NOTE — Telephone Encounter (Signed)
Ok to refill but advise her she needs to have blood work done.  She was recently diagnosed with breast cancer so I know she is busy - she can do it when she is able.

## 2016-05-18 NOTE — Telephone Encounter (Signed)
Called [name] for chemotherapy F/U.  Patient is doing well.  "I was tired after the long day yesterday.  Came home, went to bed and slept like a rock."  Denies n/v.  Denies any new side effects or symptoms.  Bowel and bladder is functioning well.  Eating and drinking well and I instructed to drink 64 oz minimum daily or at least the day before, of and after treatment.  Denies questions at this time and encouraged to call if needed.  Reviewed how to call after hours in the case of an emergency.  I [place] going [place] but I can go another time.  I feel safer staying home after this first treatment.

## 2016-05-18 NOTE — Telephone Encounter (Signed)
Please advise on refill. Pt has not had lab work done for [facility] since 09/2014. LVM informing pt.

## 2016-05-18 NOTE — Telephone Encounter (Signed)
-----   Message from Perlie Gold sent at 05/17/2016  5:36 PM EDT ----- Regarding: Chemo follow up call-[place] Contact: [phone] First time Herceptin/Perjeta/Taxol. Dr. Lindi Adie patient. Please call.  Thanks,   Barnetta Chapel, RN

## 2016-05-23 ENCOUNTER — Ambulatory Visit (HOSPITAL_BASED_OUTPATIENT_CLINIC_OR_DEPARTMENT_OTHER): Payer: PPO

## 2016-05-23 ENCOUNTER — Encounter: Payer: Self-pay | Admitting: Hematology and Oncology

## 2016-05-23 ENCOUNTER — Encounter: Payer: Self-pay | Admitting: *Deleted

## 2016-05-23 ENCOUNTER — Other Ambulatory Visit (HOSPITAL_BASED_OUTPATIENT_CLINIC_OR_DEPARTMENT_OTHER): Payer: PPO

## 2016-05-23 ENCOUNTER — Ambulatory Visit: Payer: PPO

## 2016-05-23 ENCOUNTER — Ambulatory Visit (HOSPITAL_BASED_OUTPATIENT_CLINIC_OR_DEPARTMENT_OTHER): Payer: PPO | Admitting: Hematology and Oncology

## 2016-05-23 DIAGNOSIS — C50412 Malignant neoplasm of upper-outer quadrant of left female breast: Secondary | ICD-10-CM

## 2016-05-23 DIAGNOSIS — D6481 Anemia due to antineoplastic chemotherapy: Secondary | ICD-10-CM

## 2016-05-23 DIAGNOSIS — C773 Secondary and unspecified malignant neoplasm of axilla and upper limb lymph nodes: Secondary | ICD-10-CM | POA: Diagnosis not present

## 2016-05-23 DIAGNOSIS — Z5111 Encounter for antineoplastic chemotherapy: Secondary | ICD-10-CM | POA: Diagnosis not present

## 2016-05-23 DIAGNOSIS — Z23 Encounter for immunization: Secondary | ICD-10-CM

## 2016-05-23 DIAGNOSIS — Z95828 Presence of other vascular implants and grafts: Secondary | ICD-10-CM

## 2016-05-23 DIAGNOSIS — R5383 Other fatigue: Secondary | ICD-10-CM

## 2016-05-23 LAB — CBC WITH DIFFERENTIAL/PLATELET
BASO%: 0.3 % (ref 0.0–2.0)
BASOS ABS: 0 10*3/uL (ref 0.0–0.1)
EOS ABS: 0.1 10*3/uL (ref 0.0–0.5)
EOS%: 1.2 % (ref 0.0–7.0)
HEMATOCRIT: 34.4 % — AB (ref 34.8–46.6)
HGB: 11.2 g/dL — ABNORMAL LOW (ref 11.6–15.9)
LYMPH#: 1.5 10*3/uL (ref 0.9–3.3)
LYMPH%: 25.8 % (ref 14.0–49.7)
MCH: 31.2 pg (ref 25.1–34.0)
MCHC: 32.6 g/dL (ref 31.5–36.0)
MCV: 95.8 fL (ref 79.5–101.0)
MONO#: 0.4 10*3/uL (ref 0.1–0.9)
MONO%: 7.4 % (ref 0.0–14.0)
NEUT#: 3.9 10*3/uL (ref 1.5–6.5)
NEUT%: 65.3 % (ref 38.4–76.8)
PLATELETS: 275 10*3/uL (ref 145–400)
RBC: 3.59 10*6/uL — ABNORMAL LOW (ref 3.70–5.45)
RDW: 12.7 % (ref 11.2–14.5)
WBC: 5.9 10*3/uL (ref 3.9–10.3)

## 2016-05-23 LAB — COMPREHENSIVE METABOLIC PANEL
ALK PHOS: 61 U/L (ref 40–150)
ALT: 25 U/L (ref 0–55)
ANION GAP: 10 meq/L (ref 3–11)
AST: 23 U/L (ref 5–34)
Albumin: 3.6 g/dL (ref 3.5–5.0)
BUN: 20.1 mg/dL (ref 7.0–26.0)
CALCIUM: 9.9 mg/dL (ref 8.4–10.4)
CHLORIDE: 100 meq/L (ref 98–109)
CO2: 27 mEq/L (ref 22–29)
Creatinine: 1 mg/dL (ref 0.6–1.1)
EGFR: 54 mL/min/{1.73_m2} — AB (ref >90)
Glucose: 124 mg/dl (ref 70–140)
POTASSIUM: 4.5 meq/L (ref 3.5–5.1)
Sodium: 137 mEq/L (ref 136–145)
Total Bilirubin: 0.39 mg/dL (ref 0.20–1.20)
Total Protein: 7.4 g/dL (ref 6.4–8.3)

## 2016-05-23 MED ORDER — FAMOTIDINE IN NACL 20-0.9 MG/50ML-% IV SOLN
20.0000 mg | Freq: Once | INTRAVENOUS | Status: AC
  Administered 2016-05-23: 20 mg via INTRAVENOUS

## 2016-05-23 MED ORDER — SODIUM CHLORIDE 0.9 % IV SOLN
50.0000 mg/m2 | Freq: Once | INTRAVENOUS | Status: AC
  Administered 2016-05-23: 78 mg via INTRAVENOUS
  Filled 2016-05-23: qty 13

## 2016-05-23 MED ORDER — DIPHENHYDRAMINE HCL 50 MG/ML IJ SOLN
12.5000 mg | Freq: Once | INTRAMUSCULAR | Status: AC
Start: 2016-05-23 — End: 2016-05-23
  Administered 2016-05-23: 12.5 mg via INTRAVENOUS

## 2016-05-23 MED ORDER — INFLUENZA VAC SPLIT QUAD 0.5 ML IM SUSY
0.5000 mL | PREFILLED_SYRINGE | Freq: Once | INTRAMUSCULAR | Status: AC
  Administered 2016-05-23: 0.5 mL via INTRAMUSCULAR
  Filled 2016-05-23: qty 0.5

## 2016-05-23 MED ORDER — SODIUM CHLORIDE 0.9 % IJ SOLN
10.0000 mL | INTRAMUSCULAR | Status: DC | PRN
  Administered 2016-05-23: 10 mL via INTRAVENOUS
  Filled 2016-05-23: qty 10

## 2016-05-23 MED ORDER — SODIUM CHLORIDE 0.9 % IV SOLN
Freq: Once | INTRAVENOUS | Status: AC
  Administered 2016-05-23: 10:00:00 via INTRAVENOUS

## 2016-05-23 MED ORDER — DIPHENHYDRAMINE HCL 50 MG/ML IJ SOLN
INTRAMUSCULAR | Status: AC
  Filled 2016-05-23: qty 1

## 2016-05-23 MED ORDER — HEPARIN SOD (PORK) LOCK FLUSH 100 UNIT/ML IV SOLN
500.0000 [IU] | Freq: Once | INTRAVENOUS | Status: AC | PRN
  Administered 2016-05-23: 500 [IU]
  Filled 2016-05-23: qty 5

## 2016-05-23 MED ORDER — SODIUM CHLORIDE 0.9% FLUSH
10.0000 mL | INTRAVENOUS | Status: DC | PRN
  Administered 2016-05-23: 10 mL
  Filled 2016-05-23: qty 10

## 2016-05-23 NOTE — Patient Instructions (Signed)
Paclitaxel injection What is this medicine? PACLITAXEL (PAK li TAX el) is a chemotherapy drug. It targets fast dividing cells, like cancer cells, and causes these cells to die. This medicine is used to treat ovarian cancer, breast cancer, and other cancers. This medicine may be used for other purposes; ask your health care provider or pharmacist if you have questions. What should I tell my health care provider before I take this medicine? They need to know if you have any of these conditions: -blood disorders -irregular heartbeat -infection (especially a virus infection such as chickenpox, cold sores, or herpes) -liver disease -previous or ongoing radiation therapy -an unusual or allergic reaction to paclitaxel, alcohol, polyoxyethylated castor oil, other chemotherapy agents, other medicines, foods, dyes, or preservatives -pregnant or trying to get pregnant -breast-feeding How should I use this medicine? This drug is given as an infusion into a vein. It is administered in a hospital or clinic by a specially trained health care professional. Talk to your pediatrician regarding the use of this medicine in children. Special care may be needed. Overdosage: If you think you have taken too much of this medicine contact a poison control center or emergency room at once. NOTE: This medicine is only for you. Do not share this medicine with others. What if I miss a dose? It is important not to miss your dose. Call your doctor or health care professional if you are unable to keep an appointment. What may interact with this medicine? Do not take this medicine with any of the following medications: -disulfiram -metronidazole This medicine may also interact with the following medications: -cyclosporine -diazepam -ketoconazole -medicines to increase blood counts like filgrastim, pegfilgrastim, sargramostim -other chemotherapy drugs like cisplatin, doxorubicin, epirubicin, etoposide, teniposide,  vincristine -quinidine -testosterone -vaccines -verapamil Talk to your doctor or health care professional before taking any of these medicines: -acetaminophen -aspirin -ibuprofen -ketoprofen -naproxen This list may not describe all possible interactions. Give your health care provider a list of all the medicines, herbs, non-prescription drugs, or dietary supplements you use. Also tell them if you smoke, drink alcohol, or use illegal drugs. Some items may interact with your medicine. What should I watch for while using this medicine? Your condition will be monitored carefully while you are receiving this medicine. You will need important blood work done while you are taking this medicine. This drug may make you feel generally unwell. This is not uncommon, as chemotherapy can affect healthy cells as well as cancer cells. Report any side effects. Continue your course of treatment even though you feel ill unless your doctor tells you to stop. This medicine can cause serious allergic reactions. To reduce your risk you will need to take other medicine(s) before treatment with this medicine. In some cases, you may be given additional medicines to help with side effects. Follow all directions for their use. Call your doctor or health care professional for advice if you get a fever, chills or sore throat, or other symptoms of a cold or flu. Do not treat yourself. This drug decreases your body's ability to fight infections. Try to avoid being around people who are sick. This medicine may increase your risk to bruise or bleed. Call your doctor or health care professional if you notice any unusual bleeding. Be careful brushing and flossing your teeth or using a toothpick because you may get an infection or bleed more easily. If you have any dental work done, tell your dentist you are receiving this medicine. Avoid taking products that   contain aspirin, acetaminophen, ibuprofen, naproxen, or ketoprofen unless  instructed by your doctor. These medicines may hide a fever. Do not become pregnant while taking this medicine. Women should inform their doctor if they wish to become pregnant or think they might be pregnant. There is a potential for serious side effects to an unborn child. Talk to your health care professional or pharmacist for more information. Do not breast-feed an infant while taking this medicine. Men are advised not to father a child while receiving this medicine. This product may contain alcohol. Ask your pharmacist or healthcare provider if this medicine contains alcohol. Be sure to tell all healthcare providers you are taking this medicine. Certain medicines, like metronidazole and disulfiram, can cause an unpleasant reaction when taken with alcohol. The reaction includes flushing, headache, nausea, vomiting, sweating, and increased thirst. The reaction can last from 30 minutes to several hours. What side effects may I notice from receiving this medicine? Side effects that you should report to your doctor or health care professional as soon as possible: -allergic reactions like skin rash, itching or hives, swelling of the face, lips, or tongue -low blood counts - This drug may decrease the number of white blood cells, red blood cells and platelets. You may be at increased risk for infections and bleeding. -signs of infection - fever or chills, cough, sore throat, pain or difficulty passing urine -signs of decreased platelets or bleeding - bruising, pinpoint red spots on the skin, black, tarry stools, nosebleeds -signs of decreased red blood cells - unusually weak or tired, fainting spells, lightheadedness -breathing problems -chest pain -high or low blood pressure -mouth sores -nausea and vomiting -pain, swelling, redness or irritation at the injection site -pain, tingling, numbness in the hands or feet -slow or irregular heartbeat -swelling of the ankle, feet, hands Side effects that  usually do not require medical attention (report to your doctor or health care professional if they continue or are bothersome): -bone pain -complete hair loss including hair on your head, underarms, pubic hair, eyebrows, and eyelashes -changes in the color of fingernails -diarrhea -loosening of the fingernails -loss of appetite -muscle or joint pain -red flush to skin -sweating This list may not describe all possible side effects. Call your doctor for medical advice about side effects. You may report side effects to FDA at 1-800-FDA-1088. Where should I keep my medicine? This drug is given in a hospital or clinic and will not be stored at home. NOTE: This sheet is a summary. It may not cover all possible information. If you have questions about this medicine, talk to your doctor, pharmacist, or health care provider.    2016, [employer]. (2015-04-15 13:02:56)  

## 2016-05-23 NOTE — Progress Notes (Signed)
Patient Care Team: [name], MD as PCP - General (Internal Medicine)  SUMMARY OF ONCOLOGIC HISTORY:   Breast cancer of upper-outer quadrant of left female breast ([place])   04/12/2016 Initial Diagnosis    Left breast biopsy: IDC grade 2-3, left axillary LN positive for metastatic IDC; mammogram revealed a 5 cm solid mass left breast 12:00 anterior depth      05/10/2016 Imaging    Bone scan normal; CT CAP: left breast cancer, left axillary and subpectoral nodes are not pathologic by size criteriabut suspicious, no metastatic disease      05/16/2016 Breast MRI    Left breast solid with cystic appearance OUQ 1:00 crossing into the medial left breast 4.7 x 3.3 x 4.2 cm; second 38mm nodule left breast UOQ: Posterior intramammary/low axillary lymph node, indeterminate left axillary lymph nodes, skin thickening      05/17/2016 -  Neo-Adjuvant Chemotherapy    Taxol weekly 12 Herceptin and Perjeta every 3 weeks 6       CHIEF COMPLIANT: Cycle 2 Taxol  INTERVAL HISTORY: Jessica Sherman is a 82 year old with above-mentioned history of left breast cancer currently on neoadjuvant chemotherapy with Taxol Herceptin and Perjeta. Last week she received all 3 drugs and she tells me that she did not have any nausea or diarrhea. She did feel mild fatigue. She took nausea medicines twice a day and that prevented any nausea. Today her energy levels are excellent. Denies any fevers or chills.  REVIEW OF SYSTEMS:   Constitutional: Denies fevers, chills or abnormal weight loss Eyes: Denies blurriness of vision Ears, nose, mouth, throat, and face: Denies mucositis or sore throat Respiratory: Denies cough, dyspnea or wheezes Cardiovascular: Denies palpitation, chest discomfort Gastrointestinal:  Denies nausea, heartburn or change in bowel habits Skin: Denies abnormal skin rashes Lymphatics: Denies new lymphadenopathy or easy bruising Neurological:Denies numbness, tingling or new  weaknesses Behavioral/Psych: Mood is stable, no new changes  Extremities: No lower extremity edema  All other systems were reviewed with the patient and are negative.  I have reviewed the past medical history, past surgical history, social history and family history with the patient and they are unchanged from previous note.  ALLERGIES:  is allergic to other.  MEDICATIONS:  Current Outpatient Prescriptions  Medication Sig Dispense Refill  . Calcium Carb-Cholecalciferol (CALCIUM 600+D3 PO) Take 1 tablet by mouth 2 (two) times daily.    . Cholecalciferol (VITAMIN D3) 1000 UNITS CAPS Take 1,000 Units by mouth daily.     Marland Kitchen levothyroxine (SYNTHROID, LEVOTHROID) 88 MCG tablet TAKE 1 TABLET BY MOUTH DAILY EXCEPT TAKE 1/2 TABLET EVERY WEDNESDAY; MUST HAVE LABS FOR FURTHER REFILLS 30 tablet 0  . lidocaine-prilocaine (EMLA) cream Apply to affected area once 30 g 3  . meclizine (ANTIVERT) 25 MG tablet TAKE 1/2 TABLET BY MOUTH THREE TIMES DAILY AS NEEDED (Patient taking differently: TAKE 1/2 TABLET (12.5 mg) BY MOUTH THREE TIMES DAILY AS NEEDED for vertigo) 15 tablet 2  . metoprolol tartrate (LOPRESSOR) 25 MG tablet 1 bid if BP averages > 140/90 on average (Patient taking differently: Take 25 mg by mouth 2 (two) times daily as needed (If BP averages > 140/90). ) 60 tablet 2  . Multiple Vitamins-Minerals (CENTRUM SILVER PO) Take 1 tablet by mouth daily.     . Multiple Vitamins-Minerals (PRESERVISION/LUTEIN) CAPS Take 1 capsule by mouth 2 (two) times daily.     . ondansetron (ZOFRAN) 8 MG tablet Take 1 tablet (8 mg total) by mouth 2 (two) times daily as  needed (Nausea or vomiting). 30 tablet 1  . prochlorperazine (COMPAZINE) 10 MG tablet Take 1 tablet (10 mg total) by mouth every 6 (six) hours as needed (Nausea or vomiting). 30 tablet 1  . simvastatin (ZOCOR) 40 MG tablet Take 20 mg by mouth every evening.    . simvastatin (ZOCOR) 40 MG tablet TAKE 1 TABLET BY MOUTH DAILY 90 tablet 1   No current  facility-administered medications for this visit.     PHYSICAL EXAMINATION: ECOG PERFORMANCE STATUS: 1 - Symptomatic but completely ambulatory  Vitals:   05/23/16 0940  BP: (!) 150/61  Pulse: 66  Resp: 18  Temp: 98 F (36.7 C)   Filed Weights   05/23/16 0940  Weight: 117 lb 6.4 oz (53.3 kg)    GENERAL:alert, no distress and comfortable SKIN: skin color, texture, turgor are normal, no rashes or significant lesions EYES: normal, Conjunctiva are pink and non-injected, sclera clear OROPHARYNX:no exudate, no erythema and lips, buccal mucosa, and tongue normal  NECK: supple, thyroid normal size, non-tender, without nodularity LYMPH:  no palpable lymphadenopathy in the cervical, axillary or inguinal LUNGS: clear to auscultation and percussion with normal breathing effort HEART: regular rate & rhythm and no murmurs and no lower extremity edema ABDOMEN:abdomen soft, non-tender and normal bowel sounds MUSCULOSKELETAL:no cyanosis of digits and no clubbing  NEURO: alert & oriented x 3 with fluent speech, no focal motor/sensory deficits EXTREMITIES: No lower extremity edema  LABORATORY DATA:  I have reviewed the data as listed   Chemistry      Component Value Date/Time   NA 137 05/17/2016 0924   K 4.4 05/17/2016 0924   CL 102 09/15/2014 1018   CO2 28 05/17/2016 0924   BUN 15.2 05/17/2016 0924   CREATININE 0.8 05/17/2016 0924      Component Value Date/Time   CALCIUM 10.3 05/17/2016 0924   ALKPHOS 59 05/17/2016 0924   AST 27 05/17/2016 0924   ALT 21 05/17/2016 0924   BILITOT 0.31 05/17/2016 0924       Lab Results  Component Value Date   WBC 5.9 05/23/2016   HGB 11.2 (L) 05/23/2016   HCT 34.4 (L) 05/23/2016   MCV 95.8 05/23/2016   PLT 275 05/23/2016   NEUTROABS 3.9 05/23/2016     ASSESSMENT & PLAN:  Breast cancer of upper-outer quadrant of left female breast ([place]) Left breast biopsy 04/12/2016: IDC grade 2-3, left axillary LN positive for metastatic IDC; mammogram  revealed a 5 cm solid mass left breast 12:00 anterior depth.  Breast MRI: 05/16/2016:Left breast solid with cystic appearance OUQ 1:00 crossing into the medial left breast 4.7 x 3.3 x 4.2 cm; second 50mm nodule left breast UOQ: Posterior intramammary/low axillary lymph node, indeterminate left axillary lymph nodes, skin thickening Bone scan and CT chest abdomen pelvis 05/10/2016: No metastatic disease Clinical stage: T2 N1 M0 stage IIB  Treatment plan: 1. Neoadjuvant chemotherapy with Taxol Herceptin Perjeta. Taxol to be given weekly 12, Herceptin Perjeta to be given every 3 weeks 6 cycles 2. Mastectomy with sentinel lymph node biopsy/selective axillary node dissection 3. +/-adjuvant radiation ------------------------------------------------------------------------------------------------------------------------------------------------------- Current treatment: Cycle 1 day 8 Taxol Herceptin Perjeta; Taxol being given weekly, Herceptin Perjeta every 3 weeks 6 cycles Echocardiogram 05/04/2016: EF 55-60%  Chemotherapy toxicities: 1. Grade 1 anemia 2. grade 1 fatigue Monitoring closely for chemotherapy toxicities  Return to clinic in 2 weeks for cycle 2.   No orders of the defined types were placed in this encounter.  The patient has a good understanding of  the overall plan. she agrees with it. she will call with any problems that may develop before the next visit here.   Rulon Eisenmenger, MD 05/23/16

## 2016-05-23 NOTE — Progress Notes (Signed)
[place] Social Work  Clinical Social Work was referred by Therapist, sports to review and complete healthcare advance directives.  Clinical Social Worker met with patient and patients daughter in infusion room at [facility].  The patient designated Shakenna Herrero as their primary healthcare agent and Porshia Blizzard as their secondary agent.  Patient also completed healthcare living will.    Clinical Social Worker notarized documents and made copies for patient/family. Clinical Social Worker will send documents to medical records to be scanned into patient's chart. Clinical Social Worker encouraged patient/family to contact with any additional questions or concerns.  Johnnye Lana, MSW, LCSW, OSW-C Clinical Social Worker [facility] [phone]

## 2016-05-23 NOTE — Patient Instructions (Signed)

## 2016-05-23 NOTE — Assessment & Plan Note (Signed)
Left breast biopsy 04/12/2016: IDC grade 2-3, left axillary LN positive for metastatic IDC; mammogram revealed a 5 cm solid mass left breast 12:00 anterior depth.  Breast MRI: 05/16/2016:Left breast solid with cystic appearance OUQ 1:00 crossing into the medial left breast 4.7 x 3.3 x 4.2 cm; second 26mm nodule left breast UOQ: Posterior intramammary/low axillary lymph node, indeterminate left axillary lymph nodes, skin thickening Bone scan and CT chest abdomen pelvis 05/10/2016: No metastatic disease Clinical stage: T2 N1 M0 stage IIB  Treatment plan: 1. Neoadjuvant chemotherapy with Taxol Herceptin Perjeta. Taxol to be given weekly 12, Herceptin Perjeta to be given every 3 weeks 6 cycles 2. Mastectomy with sentinel lymph node biopsy/selective axillary node dissection 3. +/-adjuvant radiation ------------------------------------------------------------------------------------------------------------------------------------------------------- Current treatment: Cycle 1 day 8 Taxol Herceptin Perjeta; Taxol being given weekly, Herceptin Perjeta every 3 weeks 6 cycles Echocardiogram 05/04/2016: EF 55-60%  Chemotherapy toxicities:  Return to clinic in 2 weeks for cycle 2.

## 2016-05-30 ENCOUNTER — Ambulatory Visit (HOSPITAL_BASED_OUTPATIENT_CLINIC_OR_DEPARTMENT_OTHER): Payer: PPO

## 2016-05-30 ENCOUNTER — Other Ambulatory Visit (HOSPITAL_BASED_OUTPATIENT_CLINIC_OR_DEPARTMENT_OTHER): Payer: PPO

## 2016-05-30 VITALS — BP 160/64 | HR 67 | Temp 97.5°F | Resp 18

## 2016-05-30 DIAGNOSIS — Z5111 Encounter for antineoplastic chemotherapy: Secondary | ICD-10-CM | POA: Diagnosis not present

## 2016-05-30 DIAGNOSIS — C50412 Malignant neoplasm of upper-outer quadrant of left female breast: Secondary | ICD-10-CM

## 2016-05-30 LAB — COMPREHENSIVE METABOLIC PANEL
ALBUMIN: 3.4 g/dL — AB (ref 3.5–5.0)
ALK PHOS: 58 U/L (ref 40–150)
ALT: 28 U/L (ref 0–55)
AST: 24 U/L (ref 5–34)
Anion Gap: 9 mEq/L (ref 3–11)
BILIRUBIN TOTAL: 0.36 mg/dL (ref 0.20–1.20)
BUN: 19.3 mg/dL (ref 7.0–26.0)
CALCIUM: 9.8 mg/dL (ref 8.4–10.4)
CO2: 27 mEq/L (ref 22–29)
CREATININE: 0.8 mg/dL (ref 0.6–1.1)
Chloride: 101 mEq/L (ref 98–109)
EGFR: 71 mL/min/{1.73_m2} — ABNORMAL LOW (ref >90)
Glucose: 91 mg/dl (ref 70–140)
POTASSIUM: 4.2 meq/L (ref 3.5–5.1)
Sodium: 136 mEq/L (ref 136–145)
TOTAL PROTEIN: 7.2 g/dL (ref 6.4–8.3)

## 2016-05-30 LAB — CBC WITH DIFFERENTIAL/PLATELET
BASO%: 0.8 % (ref 0.0–2.0)
BASOS ABS: 0 10*3/uL (ref 0.0–0.1)
EOS ABS: 0.1 10*3/uL (ref 0.0–0.5)
EOS%: 2.2 % (ref 0.0–7.0)
HEMATOCRIT: 33.1 % — AB (ref 34.8–46.6)
HEMOGLOBIN: 11 g/dL — AB (ref 11.6–15.9)
LYMPH#: 1.7 10*3/uL (ref 0.9–3.3)
LYMPH%: 33 % (ref 14.0–49.7)
MCH: 31.8 pg (ref 25.1–34.0)
MCHC: 33.3 g/dL (ref 31.5–36.0)
MCV: 95.3 fL (ref 79.5–101.0)
MONO#: 0.6 10*3/uL (ref 0.1–0.9)
MONO%: 11.5 % (ref 0.0–14.0)
NEUT%: 52.5 % (ref 38.4–76.8)
NEUTROS ABS: 2.8 10*3/uL (ref 1.5–6.5)
Platelets: 308 10*3/uL (ref 145–400)
RBC: 3.48 10*6/uL — ABNORMAL LOW (ref 3.70–5.45)
RDW: 13.1 % (ref 11.2–14.5)
WBC: 5.3 10*3/uL (ref 3.9–10.3)

## 2016-05-30 MED ORDER — SODIUM CHLORIDE 0.9 % IV SOLN
50.0000 mg/m2 | Freq: Once | INTRAVENOUS | Status: AC
  Administered 2016-05-30: 78 mg via INTRAVENOUS
  Filled 2016-05-30: qty 13

## 2016-05-30 MED ORDER — DIPHENHYDRAMINE HCL 50 MG/ML IJ SOLN
INTRAMUSCULAR | Status: AC
  Filled 2016-05-30: qty 1

## 2016-05-30 MED ORDER — DIPHENHYDRAMINE HCL 50 MG/ML IJ SOLN
12.5000 mg | Freq: Once | INTRAMUSCULAR | Status: AC
  Administered 2016-05-30: 12.5 mg via INTRAVENOUS

## 2016-05-30 MED ORDER — SODIUM CHLORIDE 0.9% FLUSH
10.0000 mL | INTRAVENOUS | Status: DC | PRN
  Administered 2016-05-30: 10 mL
  Filled 2016-05-30: qty 10

## 2016-05-30 MED ORDER — FAMOTIDINE IN NACL 20-0.9 MG/50ML-% IV SOLN
20.0000 mg | Freq: Once | INTRAVENOUS | Status: AC
  Administered 2016-05-30: 20 mg via INTRAVENOUS

## 2016-05-30 MED ORDER — SODIUM CHLORIDE 0.9 % IV SOLN
Freq: Once | INTRAVENOUS | Status: AC
  Administered 2016-05-30: 09:00:00 via INTRAVENOUS

## 2016-05-30 MED ORDER — HEPARIN SOD (PORK) LOCK FLUSH 100 UNIT/ML IV SOLN
500.0000 [IU] | Freq: Once | INTRAVENOUS | Status: AC | PRN
  Administered 2016-05-30: 500 [IU]
  Filled 2016-05-30: qty 5

## 2016-05-30 MED ORDER — FAMOTIDINE IN NACL 20-0.9 MG/50ML-% IV SOLN
INTRAVENOUS | Status: AC
  Filled 2016-05-30: qty 50

## 2016-05-30 NOTE — Patient Instructions (Signed)
[facility] Discharge Instructions for Patients Receiving Chemotherapy  Today you received the following chemotherapy agents Taxol.  To help prevent nausea and vomiting after your treatment, we encourage you to take your nausea medication as prescribed by your MD.   If you develop nausea and vomiting that is not controlled by your nausea medication, call the clinic.   BELOW ARE SYMPTOMS THAT SHOULD BE REPORTED IMMEDIATELY:  *FEVER GREATER THAN 100.5 F  *CHILLS WITH OR WITHOUT FEVER  NAUSEA AND VOMITING THAT IS NOT CONTROLLED WITH YOUR NAUSEA MEDICATION  *UNUSUAL SHORTNESS OF BREATH  *UNUSUAL BRUISING OR BLEEDING  TENDERNESS IN MOUTH AND THROAT WITH OR WITHOUT PRESENCE OF ULCERS  *URINARY PROBLEMS  *BOWEL PROBLEMS  UNUSUAL RASH Items with * indicate a potential emergency and should be followed up as soon as possible.  Feel free to call the clinic you have any questions or concerns. The clinic phone number is [phone].  Please show the [place] at check-in to the Emergency Department and triage nurse.

## 2016-06-06 ENCOUNTER — Other Ambulatory Visit (HOSPITAL_BASED_OUTPATIENT_CLINIC_OR_DEPARTMENT_OTHER): Payer: PPO

## 2016-06-06 ENCOUNTER — Ambulatory Visit (HOSPITAL_BASED_OUTPATIENT_CLINIC_OR_DEPARTMENT_OTHER): Payer: PPO

## 2016-06-06 ENCOUNTER — Ambulatory Visit (HOSPITAL_BASED_OUTPATIENT_CLINIC_OR_DEPARTMENT_OTHER): Payer: PPO | Admitting: Hematology and Oncology

## 2016-06-06 ENCOUNTER — Ambulatory Visit: Payer: PPO

## 2016-06-06 ENCOUNTER — Encounter: Payer: Self-pay | Admitting: *Deleted

## 2016-06-06 ENCOUNTER — Encounter: Payer: Self-pay | Admitting: Hematology and Oncology

## 2016-06-06 DIAGNOSIS — C50412 Malignant neoplasm of upper-outer quadrant of left female breast: Secondary | ICD-10-CM

## 2016-06-06 DIAGNOSIS — D649 Anemia, unspecified: Secondary | ICD-10-CM | POA: Diagnosis not present

## 2016-06-06 DIAGNOSIS — C773 Secondary and unspecified malignant neoplasm of axilla and upper limb lymph nodes: Secondary | ICD-10-CM | POA: Diagnosis not present

## 2016-06-06 DIAGNOSIS — Z95828 Presence of other vascular implants and grafts: Secondary | ICD-10-CM

## 2016-06-06 DIAGNOSIS — Z5112 Encounter for antineoplastic immunotherapy: Secondary | ICD-10-CM

## 2016-06-06 LAB — COMPREHENSIVE METABOLIC PANEL
ALBUMIN: 3.7 g/dL (ref 3.5–5.0)
ALT: 24 U/L (ref 0–55)
ANION GAP: 8 meq/L (ref 3–11)
AST: 25 U/L (ref 5–34)
Alkaline Phosphatase: 62 U/L (ref 40–150)
BUN: 14.2 mg/dL (ref 7.0–26.0)
CO2: 26 meq/L (ref 22–29)
CREATININE: 0.8 mg/dL (ref 0.6–1.1)
Calcium: 9.9 mg/dL (ref 8.4–10.4)
Chloride: 100 mEq/L (ref 98–109)
EGFR: 68 mL/min/{1.73_m2} — AB (ref >90)
Glucose: 111 mg/dl (ref 70–140)
Potassium: 4.4 mEq/L (ref 3.5–5.1)
Sodium: 134 mEq/L — ABNORMAL LOW (ref 136–145)
TOTAL PROTEIN: 7.5 g/dL (ref 6.4–8.3)
Total Bilirubin: 0.36 mg/dL (ref 0.20–1.20)

## 2016-06-06 LAB — CBC WITH DIFFERENTIAL/PLATELET
BASO%: 1 % (ref 0.0–2.0)
Basophils Absolute: 0.1 10*3/uL (ref 0.0–0.1)
EOS ABS: 0.1 10*3/uL (ref 0.0–0.5)
EOS%: 1.4 % (ref 0.0–7.0)
HCT: 34.7 % — ABNORMAL LOW (ref 34.8–46.6)
HEMOGLOBIN: 11.5 g/dL — AB (ref 11.6–15.9)
LYMPH%: 26.7 % (ref 14.0–49.7)
MCH: 31.7 pg (ref 25.1–34.0)
MCHC: 33 g/dL (ref 31.5–36.0)
MCV: 96.2 fL (ref 79.5–101.0)
MONO#: 0.7 10*3/uL (ref 0.1–0.9)
MONO%: 12.5 % (ref 0.0–14.0)
NEUT%: 58.4 % (ref 38.4–76.8)
NEUTROS ABS: 3.3 10*3/uL (ref 1.5–6.5)
PLATELETS: 316 10*3/uL (ref 145–400)
RBC: 3.61 10*6/uL — ABNORMAL LOW (ref 3.70–5.45)
RDW: 13.3 % (ref 11.2–14.5)
WBC: 5.6 10*3/uL (ref 3.9–10.3)
lymph#: 1.5 10*3/uL (ref 0.9–3.3)

## 2016-06-06 MED ORDER — SODIUM CHLORIDE 0.9% FLUSH
10.0000 mL | INTRAVENOUS | Status: DC | PRN
  Administered 2016-06-06: 10 mL
  Filled 2016-06-06: qty 10

## 2016-06-06 MED ORDER — PERTUZUMAB CHEMO INJECTION 420 MG/14ML
420.0000 mg | Freq: Once | INTRAVENOUS | Status: AC
  Administered 2016-06-06: 420 mg via INTRAVENOUS
  Filled 2016-06-06: qty 14

## 2016-06-06 MED ORDER — DIPHENHYDRAMINE HCL 50 MG/ML IJ SOLN
INTRAMUSCULAR | Status: AC
  Filled 2016-06-06: qty 1

## 2016-06-06 MED ORDER — ACETAMINOPHEN 325 MG PO TABS
650.0000 mg | ORAL_TABLET | Freq: Once | ORAL | Status: AC
  Administered 2016-06-06: 650 mg via ORAL

## 2016-06-06 MED ORDER — FAMOTIDINE IN NACL 20-0.9 MG/50ML-% IV SOLN
20.0000 mg | Freq: Once | INTRAVENOUS | Status: AC
  Administered 2016-06-06: 20 mg via INTRAVENOUS

## 2016-06-06 MED ORDER — FAMOTIDINE IN NACL 20-0.9 MG/50ML-% IV SOLN
INTRAVENOUS | Status: AC
  Filled 2016-06-06: qty 50

## 2016-06-06 MED ORDER — SODIUM CHLORIDE 0.9 % IV SOLN
50.0000 mg/m2 | Freq: Once | INTRAVENOUS | Status: AC
  Administered 2016-06-06: 78 mg via INTRAVENOUS
  Filled 2016-06-06: qty 13

## 2016-06-06 MED ORDER — SODIUM CHLORIDE 0.9 % IJ SOLN
10.0000 mL | INTRAMUSCULAR | Status: DC | PRN
  Administered 2016-06-06: 10 mL via INTRAVENOUS
  Filled 2016-06-06: qty 10

## 2016-06-06 MED ORDER — HEPARIN SOD (PORK) LOCK FLUSH 100 UNIT/ML IV SOLN
500.0000 [IU] | Freq: Once | INTRAVENOUS | Status: AC | PRN
  Administered 2016-06-06: 500 [IU]
  Filled 2016-06-06: qty 5

## 2016-06-06 MED ORDER — SODIUM CHLORIDE 0.9 % IV SOLN
Freq: Once | INTRAVENOUS | Status: AC
  Administered 2016-06-06: 11:00:00 via INTRAVENOUS

## 2016-06-06 MED ORDER — ACETAMINOPHEN 325 MG PO TABS
ORAL_TABLET | ORAL | Status: AC
  Filled 2016-06-06: qty 2

## 2016-06-06 MED ORDER — TRASTUZUMAB CHEMO 150 MG IV SOLR
6.0000 mg/kg | Freq: Once | INTRAVENOUS | Status: AC
  Administered 2016-06-06: 315 mg via INTRAVENOUS
  Filled 2016-06-06: qty 15

## 2016-06-06 MED ORDER — DIPHENHYDRAMINE HCL 50 MG/ML IJ SOLN
12.5000 mg | Freq: Once | INTRAMUSCULAR | Status: AC
  Administered 2016-06-06: 12.5 mg via INTRAVENOUS

## 2016-06-06 MED ORDER — PALONOSETRON HCL INJECTION 0.25 MG/5ML
INTRAVENOUS | Status: AC
  Filled 2016-06-06: qty 5

## 2016-06-06 NOTE — Progress Notes (Signed)
Patient Care Team: [name], MD as PCP - General (Internal Medicine)  SUMMARY OF ONCOLOGIC HISTORY:   Breast cancer of upper-outer quadrant of left female breast ([place])   04/12/2016 Initial Diagnosis    Left breast biopsy: IDC grade 2-3, left axillary LN positive for metastatic IDC; mammogram revealed a 5 cm solid mass left breast 12:00 anterior depth      05/10/2016 Imaging    Bone scan normal; CT CAP: left breast cancer, left axillary and subpectoral nodes are not pathologic by size criteriabut suspicious, no metastatic disease      05/16/2016 Breast MRI    Left breast solid with cystic appearance OUQ 1:00 crossing into the medial left breast 4.7 x 3.3 x 4.2 cm; second 9m nodule left breast UOQ: Posterior intramammary/low axillary lymph node, indeterminate left axillary lymph nodes, skin thickening      05/17/2016 -  Neo-Adjuvant Chemotherapy    Taxol weekly 12 Herceptin and Perjeta every 3 weeks 6       CHIEF COMPLIANT: Cycle 4 Taxol and cycle 2 Herceptin Perjeta  INTERVAL HISTORY: Jessica BRINESis a 82year old with above-mentioned history of left breast cancer HER-2 positive disease currently on neoadjuvant chemotherapy. She is doing remarkably well. She reports no side effects to Taxol. She denies any nausea vomiting or hair loss. Denies any neuropathy. She's been eating very well and maintaining her weight. She reports no loss of hair or loss of appetite. Denies any diarrhea or constipation. She reports of the breast lump is no longer palpable.  REVIEW OF SYSTEMS:   Constitutional: Denies fevers, chills or abnormal weight loss Eyes: Denies blurriness of vision Ears, nose, mouth, throat, and face: Denies mucositis or sore throat Respiratory: Denies cough, dyspnea or wheezes Cardiovascular: Denies palpitation, chest discomfort Gastrointestinal:  Denies nausea, heartburn or change in bowel habits Skin: Denies abnormal skin rashes Lymphatics: Denies new  lymphadenopathy or easy bruising Neurological:Denies numbness, tingling or new weaknesses Behavioral/Psych: Mood is stable, no new changes  Extremities: No lower extremity edema Breast: Breast lump is not palpable All other systems were reviewed with the patient and are negative.  I have reviewed the past medical history, past surgical history, social history and family history with the patient and they are unchanged from previous note.  ALLERGIES:  is allergic to other.  MEDICATIONS:  Current Outpatient Prescriptions  Medication Sig Dispense Refill  . Calcium Carb-Cholecalciferol (CALCIUM 600+D3 PO) Take 1 tablet by mouth 2 (two) times daily.    . Cholecalciferol (VITAMIN D3) 1000 UNITS CAPS Take 1,000 Units by mouth daily.     .Marland Kitchenlevothyroxine (SYNTHROID, LEVOTHROID) 88 MCG tablet TAKE 1 TABLET BY MOUTH DAILY EXCEPT TAKE 1/2 TABLET EVERY WEDNESDAY; MUST HAVE LABS FOR FURTHER REFILLS 30 tablet 0  . lidocaine-prilocaine (EMLA) cream Apply to affected area once 30 g 3  . meclizine (ANTIVERT) 25 MG tablet TAKE 1/2 TABLET BY MOUTH THREE TIMES DAILY AS NEEDED (Patient taking differently: TAKE 1/2 TABLET (12.5 mg) BY MOUTH THREE TIMES DAILY AS NEEDED for vertigo) 15 tablet 2  . metoprolol tartrate (LOPRESSOR) 25 MG tablet 1 bid if BP averages > 140/90 on average (Patient taking differently: Take 25 mg by mouth 2 (two) times daily as needed (If BP averages > 140/90). ) 60 tablet 2  . Multiple Vitamins-Minerals (CENTRUM SILVER PO) Take 1 tablet by mouth daily.     . Multiple Vitamins-Minerals (PRESERVISION/LUTEIN) CAPS Take 1 capsule by mouth 2 (two) times daily.     . ondansetron (  ZOFRAN) 8 MG tablet Take 1 tablet (8 mg total) by mouth 2 (two) times daily as needed (Nausea or vomiting). 30 tablet 1  . prochlorperazine (COMPAZINE) 10 MG tablet Take 1 tablet (10 mg total) by mouth every 6 (six) hours as needed (Nausea or vomiting). 30 tablet 1  . simvastatin (ZOCOR) 40 MG tablet Take 20 mg by mouth  every evening.    . simvastatin (ZOCOR) 40 MG tablet TAKE 1 TABLET BY MOUTH DAILY 90 tablet 1   No current facility-administered medications for this visit.     PHYSICAL EXAMINATION: ECOG PERFORMANCE STATUS: 0 - Asymptomatic  Vitals:   06/06/16 0940  BP: (!) 167/69  Pulse: 69  Resp: 18  Temp: 98 F (36.7 C)   Filed Weights   06/06/16 0940  Weight: 115 lb 14.4 oz (52.6 kg)    GENERAL:alert, no distress and comfortable SKIN: skin color, texture, turgor are normal, no rashes or significant lesions EYES: normal, Conjunctiva are pink and non-injected, sclera clear OROPHARYNX:no exudate, no erythema and lips, buccal mucosa, and tongue normal  NECK: supple, thyroid normal size, non-tender, without nodularity LYMPH:  no palpable lymphadenopathy in the cervical, axillary or inguinal LUNGS: clear to auscultation and percussion with normal breathing effort HEART: regular rate & rhythm and no murmurs and no lower extremity edema ABDOMEN:abdomen soft, non-tender and normal bowel sounds MUSCULOSKELETAL:no cyanosis of digits and no clubbing  NEURO: alert & oriented x 3 with fluent speech, no focal motor/sensory deficits EXTREMITIES: No lower extremity edema  LABORATORY DATA:  I have reviewed the data as listed   Chemistry      Component Value Date/Time   NA 136 05/30/2016 0906   K 4.2 05/30/2016 0906   CL 102 09/15/2014 1018   CO2 27 05/30/2016 0906   BUN 19.3 05/30/2016 0906   CREATININE 0.8 05/30/2016 0906      Component Value Date/Time   CALCIUM 9.8 05/30/2016 0906   ALKPHOS 58 05/30/2016 0906   AST 24 05/30/2016 0906   ALT 28 05/30/2016 0906   BILITOT 0.36 05/30/2016 0906       Lab Results  Component Value Date   WBC 5.6 06/06/2016   HGB 11.5 (L) 06/06/2016   HCT 34.7 (L) 06/06/2016   MCV 96.2 06/06/2016   PLT 316 06/06/2016   NEUTROABS 3.3 06/06/2016     ASSESSMENT & PLAN:  Breast cancer of upper-outer quadrant of left female breast ([place]) Left breast biopsy  04/12/2016: IDC grade 2-3, left axillary LN positive for metastatic IDC; mammogram revealed a 5 cm solid mass left breast 12:00 anterior depth.  Breast MRI: 05/16/2016:Left breast solid with cystic appearance OUQ 1:00 crossing into the medial left breast 4.7 x 3.3 x 4.2 cm; second 63m nodule left breast UOQ: Posterior intramammary/low axillary lymph node, indeterminate left axillary lymph nodes, skin thickening Bone scan and CT chest abdomen pelvis 05/10/2016: No metastatic disease Clinical stage: T2 N1 M0 stage IIB  Treatment plan: 1. Neoadjuvant chemotherapy with Taxol Herceptin Perjeta. Taxol to be given weekly 12, Herceptin Perjeta to be given every 3 weeks 6 cycles 2. Mastectomy with sentinel lymph node biopsy/selective axillary node dissection 3. +/-adjuvant radiation ------------------------------------------------------------------------------------------------------------------------------------------------------- Current treatment: Cycle 4 Taxol; cycle 2 Herceptin Perjeta; Taxol being given weekly X 12, Herceptin Perjeta every 3 weeks 6 cycles Echocardiogram 05/04/2016: EF 55-60%  Chemotherapy toxicities: 1. Grade 1 anemia 2. grade 1 fatigue Monitoring closely for chemotherapy toxicities  Return to clinic in 3 weeks for cycle 7 Taxol and cycle 3 Herceptin  Perjeta.   No orders of the defined types were placed in this encounter.  The patient has a good understanding of the overall plan. she agrees with it. she will call with any problems that may develop before the next visit here.   Rulon Eisenmenger, MD 06/06/16

## 2016-06-06 NOTE — Patient Instructions (Signed)
[facility] Discharge Instructions for Patients Receiving Chemotherapy  Today you received the following chemotherapy agents Herceptin/Perjeta/Taxol.  To help prevent nausea and vomiting after your treatment, we encourage you to take your nausea medication as directed.   If you develop nausea and vomiting that is not controlled by your nausea medication, call the clinic.   BELOW ARE SYMPTOMS THAT SHOULD BE REPORTED IMMEDIATELY:  *FEVER GREATER THAN 100.5 F  *CHILLS WITH OR WITHOUT FEVER  NAUSEA AND VOMITING THAT IS NOT CONTROLLED WITH YOUR NAUSEA MEDICATION  *UNUSUAL SHORTNESS OF BREATH  *UNUSUAL BRUISING OR BLEEDING  TENDERNESS IN MOUTH AND THROAT WITH OR WITHOUT PRESENCE OF ULCERS  *URINARY PROBLEMS  *BOWEL PROBLEMS  UNUSUAL RASH Items with * indicate a potential emergency and should be followed up as soon as possible.  Feel free to call the clinic you have any questions or concerns. The clinic phone number is [phone].  Please show the [place] at check-in to the Emergency Department and triage nurse.

## 2016-06-06 NOTE — Assessment & Plan Note (Signed)
Left breast biopsy 04/12/2016: IDC grade 2-3, left axillary LN positive for metastatic IDC; mammogram revealed a 5 cm solid mass left breast 12:00 anterior depth.  Breast MRI: 05/16/2016:Left breast solid with cystic appearance OUQ 1:00 crossing into the medial left breast 4.7 x 3.3 x 4.2 cm; second 69mm nodule left breast UOQ: Posterior intramammary/low axillary lymph node, indeterminate left axillary lymph nodes, skin thickening Bone scan and CT chest abdomen pelvis 05/10/2016: No metastatic disease Clinical stage: T2 N1 M0 stage IIB  Treatment plan: 1. Neoadjuvant chemotherapy with Taxol Herceptin Perjeta. Taxol to be given weekly 12, Herceptin Perjeta to be given every 3 weeks 6 cycles 2. Mastectomy with sentinel lymph node biopsy/selective axillary node dissection 3. +/-adjuvant radiation ------------------------------------------------------------------------------------------------------------------------------------------------------- Current treatment: Cycle 4 Taxol; cycle 2 Herceptin Perjeta; Taxol being given weekly X 12, Herceptin Perjeta every 3 weeks 6 cycles Echocardiogram 05/04/2016: EF 55-60%  Chemotherapy toxicities: 1. Grade 1 anemia 2. grade 1 fatigue Monitoring closely for chemotherapy toxicities  Return to clinic in 2 weeks for cycle 6 Taxol.

## 2016-06-13 ENCOUNTER — Ambulatory Visit: Payer: PPO

## 2016-06-13 ENCOUNTER — Ambulatory Visit (HOSPITAL_BASED_OUTPATIENT_CLINIC_OR_DEPARTMENT_OTHER): Payer: PPO

## 2016-06-13 ENCOUNTER — Other Ambulatory Visit (HOSPITAL_BASED_OUTPATIENT_CLINIC_OR_DEPARTMENT_OTHER): Payer: PPO

## 2016-06-13 VITALS — BP 165/64 | HR 71 | Temp 98.0°F | Resp 18

## 2016-06-13 DIAGNOSIS — C50412 Malignant neoplasm of upper-outer quadrant of left female breast: Secondary | ICD-10-CM

## 2016-06-13 DIAGNOSIS — Z95828 Presence of other vascular implants and grafts: Secondary | ICD-10-CM

## 2016-06-13 LAB — COMPREHENSIVE METABOLIC PANEL
ALT: 24 U/L (ref 0–55)
ANION GAP: 8 meq/L (ref 3–11)
AST: 22 U/L (ref 5–34)
Albumin: 3.4 g/dL — ABNORMAL LOW (ref 3.5–5.0)
Alkaline Phosphatase: 61 U/L (ref 40–150)
BUN: 18.9 mg/dL (ref 7.0–26.0)
CO2: 26 meq/L (ref 22–29)
Calcium: 9.5 mg/dL (ref 8.4–10.4)
Chloride: 101 mEq/L (ref 98–109)
Creatinine: 0.7 mg/dL (ref 0.6–1.1)
EGFR: 76 mL/min/{1.73_m2} — AB (ref >90)
Glucose: 110 mg/dl (ref 70–140)
Potassium: 4.1 mEq/L (ref 3.5–5.1)
Sodium: 135 mEq/L — ABNORMAL LOW (ref 136–145)
TOTAL PROTEIN: 7 g/dL (ref 6.4–8.3)

## 2016-06-13 LAB — CBC WITH DIFFERENTIAL/PLATELET
BASO%: 1.2 % (ref 0.0–2.0)
Basophils Absolute: 0.1 10*3/uL (ref 0.0–0.1)
EOS%: 1.7 % (ref 0.0–7.0)
Eosinophils Absolute: 0.1 10*3/uL (ref 0.0–0.5)
HCT: 32 % — ABNORMAL LOW (ref 34.8–46.6)
HGB: 10.7 g/dL — ABNORMAL LOW (ref 11.6–15.9)
LYMPH%: 24.7 % (ref 14.0–49.7)
MCH: 32.1 pg (ref 25.1–34.0)
MCHC: 33.5 g/dL (ref 31.5–36.0)
MCV: 95.9 fL (ref 79.5–101.0)
MONO#: 0.8 10*3/uL (ref 0.1–0.9)
MONO%: 12.9 % (ref 0.0–14.0)
NEUT#: 3.5 10*3/uL (ref 1.5–6.5)
NEUT%: 59.5 % (ref 38.4–76.8)
PLATELETS: 293 10*3/uL (ref 145–400)
RBC: 3.34 10*6/uL — AB (ref 3.70–5.45)
RDW: 13.6 % (ref 11.2–14.5)
WBC: 5.8 10*3/uL (ref 3.9–10.3)
lymph#: 1.4 10*3/uL (ref 0.9–3.3)

## 2016-06-13 MED ORDER — DIPHENHYDRAMINE HCL 50 MG/ML IJ SOLN
12.5000 mg | Freq: Once | INTRAMUSCULAR | Status: AC
  Administered 2016-06-13: 12.5 mg via INTRAVENOUS

## 2016-06-13 MED ORDER — SODIUM CHLORIDE 0.9 % IJ SOLN
10.0000 mL | INTRAMUSCULAR | Status: DC | PRN
  Administered 2016-06-13: 10 mL via INTRAVENOUS
  Filled 2016-06-13: qty 10

## 2016-06-13 MED ORDER — SODIUM CHLORIDE 0.9 % IV SOLN
Freq: Once | INTRAVENOUS | Status: AC
  Administered 2016-06-13: 11:00:00 via INTRAVENOUS

## 2016-06-13 MED ORDER — HEPARIN SOD (PORK) LOCK FLUSH 100 UNIT/ML IV SOLN
500.0000 [IU] | Freq: Once | INTRAVENOUS | Status: AC | PRN
  Administered 2016-06-13: 500 [IU]
  Filled 2016-06-13: qty 5

## 2016-06-13 MED ORDER — SODIUM CHLORIDE 0.9% FLUSH
10.0000 mL | INTRAVENOUS | Status: DC | PRN
  Administered 2016-06-13: 10 mL
  Filled 2016-06-13: qty 10

## 2016-06-13 MED ORDER — FAMOTIDINE IN NACL 20-0.9 MG/50ML-% IV SOLN
20.0000 mg | Freq: Once | INTRAVENOUS | Status: AC
  Administered 2016-06-13: 20 mg via INTRAVENOUS

## 2016-06-13 MED ORDER — FAMOTIDINE IN NACL 20-0.9 MG/50ML-% IV SOLN
INTRAVENOUS | Status: AC
  Filled 2016-06-13: qty 50

## 2016-06-13 MED ORDER — DIPHENHYDRAMINE HCL 50 MG/ML IJ SOLN
INTRAMUSCULAR | Status: AC
  Filled 2016-06-13: qty 1

## 2016-06-13 MED ORDER — PACLITAXEL CHEMO INJECTION 300 MG/50ML
50.0000 mg/m2 | Freq: Once | INTRAVENOUS | Status: AC
  Administered 2016-06-13: 78 mg via INTRAVENOUS
  Filled 2016-06-13: qty 13

## 2016-06-13 NOTE — Patient Instructions (Signed)
[facility] Discharge Instructions for Patients Receiving Chemotherapy  Today you received the following chemotherapy agents Taxol  To help prevent nausea and vomiting after your treatment, we encourage you to take your nausea medication   If you develop nausea and vomiting that is not controlled by your nausea medication, call the clinic.   BELOW ARE SYMPTOMS THAT SHOULD BE REPORTED IMMEDIATELY:  *FEVER GREATER THAN 100.5 F  *CHILLS WITH OR WITHOUT FEVER  NAUSEA AND VOMITING THAT IS NOT CONTROLLED WITH YOUR NAUSEA MEDICATION  *UNUSUAL SHORTNESS OF BREATH  *UNUSUAL BRUISING OR BLEEDING  TENDERNESS IN MOUTH AND THROAT WITH OR WITHOUT PRESENCE OF ULCERS  *URINARY PROBLEMS  *BOWEL PROBLEMS  UNUSUAL RASH Items with * indicate a potential emergency and should be followed up as soon as possible.  Feel free to call the clinic you have any questions or concerns. The clinic phone number is [phone].  Please show the CHEMO ALERT CARD at check-in to the Emergency Department and triage nurse.   

## 2016-06-20 ENCOUNTER — Other Ambulatory Visit (HOSPITAL_BASED_OUTPATIENT_CLINIC_OR_DEPARTMENT_OTHER): Payer: PPO

## 2016-06-20 ENCOUNTER — Ambulatory Visit (HOSPITAL_BASED_OUTPATIENT_CLINIC_OR_DEPARTMENT_OTHER): Payer: PPO

## 2016-06-20 VITALS — BP 141/69 | HR 65 | Temp 97.8°F | Resp 18

## 2016-06-20 DIAGNOSIS — C50412 Malignant neoplasm of upper-outer quadrant of left female breast: Secondary | ICD-10-CM | POA: Diagnosis not present

## 2016-06-20 DIAGNOSIS — C773 Secondary and unspecified malignant neoplasm of axilla and upper limb lymph nodes: Secondary | ICD-10-CM | POA: Diagnosis not present

## 2016-06-20 DIAGNOSIS — Z5111 Encounter for antineoplastic chemotherapy: Secondary | ICD-10-CM | POA: Diagnosis not present

## 2016-06-20 LAB — COMPREHENSIVE METABOLIC PANEL
ALBUMIN: 3.5 g/dL (ref 3.5–5.0)
ALK PHOS: 60 U/L (ref 40–150)
ALT: 18 U/L (ref 0–55)
ANION GAP: 10 meq/L (ref 3–11)
AST: 21 U/L (ref 5–34)
BUN: 20.1 mg/dL (ref 7.0–26.0)
CALCIUM: 9.6 mg/dL (ref 8.4–10.4)
CO2: 26 meq/L (ref 22–29)
CREATININE: 0.8 mg/dL (ref 0.6–1.1)
Chloride: 103 mEq/L (ref 98–109)
EGFR: 72 mL/min/{1.73_m2} — AB (ref >90)
Glucose: 98 mg/dl (ref 70–140)
Potassium: 4.3 mEq/L (ref 3.5–5.1)
Sodium: 138 mEq/L (ref 136–145)
TOTAL PROTEIN: 7 g/dL (ref 6.4–8.3)

## 2016-06-20 LAB — CBC WITH DIFFERENTIAL/PLATELET
BASO%: 0.7 % (ref 0.0–2.0)
BASOS ABS: 0 10*3/uL (ref 0.0–0.1)
EOS ABS: 0.1 10*3/uL (ref 0.0–0.5)
EOS%: 2.6 % (ref 0.0–7.0)
HEMATOCRIT: 31.4 % — AB (ref 34.8–46.6)
HGB: 10.3 g/dL — ABNORMAL LOW (ref 11.6–15.9)
LYMPH%: 31.1 % (ref 14.0–49.7)
MCH: 31.8 pg (ref 25.1–34.0)
MCHC: 32.8 g/dL (ref 31.5–36.0)
MCV: 96.9 fL (ref 79.5–101.0)
MONO#: 0.7 10*3/uL (ref 0.1–0.9)
MONO%: 12.5 % (ref 0.0–14.0)
NEUT%: 53.1 % (ref 38.4–76.8)
NEUTROS ABS: 2.9 10*3/uL (ref 1.5–6.5)
NRBC: 0 % (ref 0–0)
PLATELETS: 276 10*3/uL (ref 145–400)
RBC: 3.24 10*6/uL — AB (ref 3.70–5.45)
RDW: 13.8 % (ref 11.2–14.5)
WBC: 5.4 10*3/uL (ref 3.9–10.3)
lymph#: 1.7 10*3/uL (ref 0.9–3.3)

## 2016-06-20 MED ORDER — SODIUM CHLORIDE 0.9% FLUSH
10.0000 mL | INTRAVENOUS | Status: DC | PRN
  Administered 2016-06-20: 10 mL
  Filled 2016-06-20: qty 10

## 2016-06-20 MED ORDER — FAMOTIDINE IN NACL 20-0.9 MG/50ML-% IV SOLN
INTRAVENOUS | Status: AC
  Filled 2016-06-20: qty 50

## 2016-06-20 MED ORDER — SODIUM CHLORIDE 0.9 % IV SOLN
50.0000 mg/m2 | Freq: Once | INTRAVENOUS | Status: AC
  Administered 2016-06-20: 78 mg via INTRAVENOUS
  Filled 2016-06-20: qty 13

## 2016-06-20 MED ORDER — DIPHENHYDRAMINE HCL 50 MG/ML IJ SOLN
12.5000 mg | Freq: Once | INTRAMUSCULAR | Status: AC
  Administered 2016-06-20: 12.5 mg via INTRAVENOUS

## 2016-06-20 MED ORDER — HEPARIN SOD (PORK) LOCK FLUSH 100 UNIT/ML IV SOLN
500.0000 [IU] | Freq: Once | INTRAVENOUS | Status: AC | PRN
  Administered 2016-06-20: 500 [IU]
  Filled 2016-06-20: qty 5

## 2016-06-20 MED ORDER — SODIUM CHLORIDE 0.9 % IV SOLN
Freq: Once | INTRAVENOUS | Status: AC
  Administered 2016-06-20: 10:00:00 via INTRAVENOUS

## 2016-06-20 MED ORDER — FAMOTIDINE IN NACL 20-0.9 MG/50ML-% IV SOLN
20.0000 mg | Freq: Once | INTRAVENOUS | Status: AC
  Administered 2016-06-20: 20 mg via INTRAVENOUS

## 2016-06-20 MED ORDER — DIPHENHYDRAMINE HCL 50 MG/ML IJ SOLN
INTRAMUSCULAR | Status: AC
  Filled 2016-06-20: qty 1

## 2016-06-20 NOTE — Patient Instructions (Signed)
[facility] Discharge Instructions for Patients Receiving Chemotherapy  Today you received the following chemotherapy agents Taxol   To help prevent nausea and vomiting after your treatment, we encourage you to take your nausea medication as directed.   If you develop nausea and vomiting that is not controlled by your nausea medication, call the clinic.   BELOW ARE SYMPTOMS THAT SHOULD BE REPORTED IMMEDIATELY:  *FEVER GREATER THAN 100.5 F  *CHILLS WITH OR WITHOUT FEVER  NAUSEA AND VOMITING THAT IS NOT CONTROLLED WITH YOUR NAUSEA MEDICATION  *UNUSUAL SHORTNESS OF BREATH  *UNUSUAL BRUISING OR BLEEDING  TENDERNESS IN MOUTH AND THROAT WITH OR WITHOUT PRESENCE OF ULCERS  *URINARY PROBLEMS  *BOWEL PROBLEMS  UNUSUAL RASH Items with * indicate a potential emergency and should be followed up as soon as possible.  Feel free to call the clinic you have any questions or concerns. The clinic phone number is [phone].  Please show the CHEMO ALERT CARD at check-in to the Emergency Department and triage nurse.   

## 2016-06-27 ENCOUNTER — Encounter: Payer: Self-pay | Admitting: Hematology and Oncology

## 2016-06-27 ENCOUNTER — Encounter: Payer: Self-pay | Admitting: *Deleted

## 2016-06-27 ENCOUNTER — Ambulatory Visit (HOSPITAL_BASED_OUTPATIENT_CLINIC_OR_DEPARTMENT_OTHER): Payer: PPO | Admitting: Hematology and Oncology

## 2016-06-27 ENCOUNTER — Ambulatory Visit: Payer: PPO

## 2016-06-27 ENCOUNTER — Ambulatory Visit (HOSPITAL_BASED_OUTPATIENT_CLINIC_OR_DEPARTMENT_OTHER): Payer: PPO

## 2016-06-27 ENCOUNTER — Other Ambulatory Visit (HOSPITAL_BASED_OUTPATIENT_CLINIC_OR_DEPARTMENT_OTHER): Payer: PPO

## 2016-06-27 DIAGNOSIS — Z171 Estrogen receptor negative status [ER-]: Secondary | ICD-10-CM | POA: Diagnosis not present

## 2016-06-27 DIAGNOSIS — C50412 Malignant neoplasm of upper-outer quadrant of left female breast: Secondary | ICD-10-CM

## 2016-06-27 DIAGNOSIS — C773 Secondary and unspecified malignant neoplasm of axilla and upper limb lymph nodes: Secondary | ICD-10-CM

## 2016-06-27 DIAGNOSIS — Z95828 Presence of other vascular implants and grafts: Secondary | ICD-10-CM

## 2016-06-27 DIAGNOSIS — Z5112 Encounter for antineoplastic immunotherapy: Secondary | ICD-10-CM

## 2016-06-27 LAB — COMPREHENSIVE METABOLIC PANEL
ALK PHOS: 66 U/L (ref 40–150)
ALT: 19 U/L (ref 0–55)
ANION GAP: 7 meq/L (ref 3–11)
AST: 22 U/L (ref 5–34)
Albumin: 3.6 g/dL (ref 3.5–5.0)
BUN: 15.1 mg/dL (ref 7.0–26.0)
CO2: 27 meq/L (ref 22–29)
Calcium: 9.6 mg/dL (ref 8.4–10.4)
Chloride: 103 mEq/L (ref 98–109)
Creatinine: 0.8 mg/dL (ref 0.6–1.1)
EGFR: 72 mL/min/{1.73_m2} — ABNORMAL LOW (ref >90)
GLUCOSE: 95 mg/dL (ref 70–140)
POTASSIUM: 4.4 meq/L (ref 3.5–5.1)
SODIUM: 137 meq/L (ref 136–145)
Total Bilirubin: 0.27 mg/dL (ref 0.20–1.20)
Total Protein: 7.2 g/dL (ref 6.4–8.3)

## 2016-06-27 LAB — CBC WITH DIFFERENTIAL/PLATELET
BASO%: 1 % (ref 0.0–2.0)
BASOS ABS: 0.1 10*3/uL (ref 0.0–0.1)
EOS ABS: 0.1 10*3/uL (ref 0.0–0.5)
EOS%: 1.6 % (ref 0.0–7.0)
HCT: 33.4 % — ABNORMAL LOW (ref 34.8–46.6)
HGB: 11 g/dL — ABNORMAL LOW (ref 11.6–15.9)
LYMPH%: 23.3 % (ref 14.0–49.7)
MCH: 31.9 pg (ref 25.1–34.0)
MCHC: 33 g/dL (ref 31.5–36.0)
MCV: 96.7 fL (ref 79.5–101.0)
MONO#: 0.8 10*3/uL (ref 0.1–0.9)
MONO%: 12.5 % (ref 0.0–14.0)
NEUT#: 3.8 10*3/uL (ref 1.5–6.5)
NEUT%: 61.6 % (ref 38.4–76.8)
PLATELETS: 290 10*3/uL (ref 145–400)
RBC: 3.45 10*6/uL — AB (ref 3.70–5.45)
RDW: 13.8 % (ref 11.2–14.5)
WBC: 6.2 10*3/uL (ref 3.9–10.3)
lymph#: 1.5 10*3/uL (ref 0.9–3.3)

## 2016-06-27 MED ORDER — FAMOTIDINE IN NACL 20-0.9 MG/50ML-% IV SOLN
20.0000 mg | Freq: Once | INTRAVENOUS | Status: AC
Start: 2016-06-27 — End: 2016-06-27
  Administered 2016-06-27: 20 mg via INTRAVENOUS

## 2016-06-27 MED ORDER — SODIUM CHLORIDE 0.9 % IJ SOLN
10.0000 mL | INTRAMUSCULAR | Status: DC | PRN
  Administered 2016-06-27: 10 mL via INTRAVENOUS
  Filled 2016-06-27: qty 10

## 2016-06-27 MED ORDER — SODIUM CHLORIDE 0.9 % IV SOLN
Freq: Once | INTRAVENOUS | Status: AC
  Administered 2016-06-27: 10:00:00 via INTRAVENOUS

## 2016-06-27 MED ORDER — SODIUM CHLORIDE 0.9% FLUSH
10.0000 mL | INTRAVENOUS | Status: DC | PRN
  Administered 2016-06-27: 10 mL
  Filled 2016-06-27: qty 10

## 2016-06-27 MED ORDER — ACETAMINOPHEN 325 MG PO TABS
ORAL_TABLET | ORAL | Status: AC
  Filled 2016-06-27: qty 2

## 2016-06-27 MED ORDER — SODIUM CHLORIDE 0.9 % IV SOLN
50.0000 mg/m2 | Freq: Once | INTRAVENOUS | Status: AC
  Administered 2016-06-27: 78 mg via INTRAVENOUS
  Filled 2016-06-27: qty 13

## 2016-06-27 MED ORDER — SODIUM CHLORIDE 0.9 % IV SOLN
420.0000 mg | Freq: Once | INTRAVENOUS | Status: AC
  Administered 2016-06-27: 420 mg via INTRAVENOUS
  Filled 2016-06-27: qty 14

## 2016-06-27 MED ORDER — FAMOTIDINE IN NACL 20-0.9 MG/50ML-% IV SOLN
INTRAVENOUS | Status: AC
  Filled 2016-06-27: qty 50

## 2016-06-27 MED ORDER — HEPARIN SOD (PORK) LOCK FLUSH 100 UNIT/ML IV SOLN
500.0000 [IU] | Freq: Once | INTRAVENOUS | Status: AC | PRN
  Administered 2016-06-27: 500 [IU]
  Filled 2016-06-27: qty 5

## 2016-06-27 MED ORDER — DIPHENHYDRAMINE HCL 50 MG/ML IJ SOLN
INTRAMUSCULAR | Status: AC
  Filled 2016-06-27: qty 1

## 2016-06-27 MED ORDER — HEPARIN SOD (PORK) LOCK FLUSH 100 UNIT/ML IV SOLN
500.0000 [IU] | Freq: Once | INTRAVENOUS | Status: DC | PRN
  Filled 2016-06-27: qty 5

## 2016-06-27 MED ORDER — DIPHENHYDRAMINE HCL 50 MG/ML IJ SOLN
12.5000 mg | Freq: Once | INTRAMUSCULAR | Status: AC
  Administered 2016-06-27: 12.5 mg via INTRAVENOUS

## 2016-06-27 MED ORDER — SODIUM CHLORIDE 0.9 % IV SOLN
6.0000 mg/kg | Freq: Once | INTRAVENOUS | Status: AC
  Administered 2016-06-27: 315 mg via INTRAVENOUS
  Filled 2016-06-27: qty 15

## 2016-06-27 MED ORDER — ACETAMINOPHEN 325 MG PO TABS
650.0000 mg | ORAL_TABLET | Freq: Once | ORAL | Status: AC
  Administered 2016-06-27: 650 mg via ORAL

## 2016-06-27 NOTE — Assessment & Plan Note (Signed)
Left breast biopsy 04/12/2016: IDC grade 2-3, left axillary LN positive for metastatic IDC; mammogram revealed a 5 cm solid mass left breast 12:00 anterior depth.  Breast MRI: 05/16/2016:Left breast solid with cystic appearance OUQ 1:00 crossing into the medial left breast 4.7 x 3.3 x 4.2 cm; second 25mm nodule left breast UOQ: Posterior intramammary/low axillary lymph node, indeterminate left axillary lymph nodes, skin thickening Bone scan and CT chest abdomen pelvis 05/10/2016: No metastatic disease Clinical stage: T2 N1 M0 stage IIB  Treatment plan: 1. Neoadjuvant chemotherapy with Taxol Herceptin Perjeta. Taxol to be given weekly 12, Herceptin Perjeta to be given every 3 weeks 6 cycles 2. Mastectomy with sentinel lymph node biopsy/selective axillary node dissection 3. +/-adjuvant radiation ------------------------------------------------------------------------------------------------------------------------------------------------------- Current treatment: Cycle 7 Taxol; cycle 3 Herceptin Perjeta; Taxol being given weekly X 12, Herceptin Perjeta every 3 weeks 6 cycles Echocardiogram 05/04/2016: EF 55-60%  Chemotherapy toxicities: 1. Grade 1 anemia 2. grade 1 fatigue Monitoring closely for chemotherapy toxicities  Return to clinic in 3 weeks for cycle 10 Taxol and cycle 4 Herceptin Perjeta.

## 2016-06-27 NOTE — Progress Notes (Signed)
Patient Care Team: [name], MD as PCP - General (Internal Medicine)  DIAGNOSIS:  Encounter Diagnosis  Name Primary?  . Malignant neoplasm of upper-outer quadrant of left breast in female, estrogen receptor negative ([place])     SUMMARY OF ONCOLOGIC HISTORY:   Breast cancer of upper-outer quadrant of left female breast ([place])   04/12/2016 Initial Diagnosis    Left breast biopsy: IDC grade 2-3, left axillary LN positive for metastatic IDC; mammogram revealed a 5 cm solid mass left breast 12:00 anterior depth, ER 0%, PR 0%, Ki-67 80%, HER-2 positive ratio 7.6, copy #16.5      05/10/2016 Imaging    Bone scan normal; CT CAP: left breast cancer, left axillary and subpectoral nodes are not pathologic by size criteriabut suspicious, no metastatic disease      05/16/2016 Breast MRI    Left breast solid with cystic appearance OUQ 1:00 crossing into the medial left breast 4.7 x 3.3 x 4.2 cm; second 68m nodule left breast UOQ: Posterior intramammary/low axillary lymph node, indeterminate left axillary lymph nodes, skin thickening      05/17/2016 -  Neo-Adjuvant Chemotherapy    Taxol weekly 12 Herceptin and Perjeta every 3 weeks 6       CHIEF COMPLIANT: Cycle 7 Taxol and cycle 3 Herceptin and Perjeta  INTERVAL HISTORY: Jessica DEAMERis a 82year old with above-mentioned history of left breast cancer currently on neoadjuvant chemotherapy and today is cycle 7 of Taxol and cycle 3 of Herceptin and Perjeta. Overall she is tolerating chemotherapy extremely well. She has mild fatigue and mild nausea. she has mild diarrhea. She does have fatigue from chemotherapy. Denies neuropathy.  REVIEW OF SYSTEMS:   Constitutional: Denies fevers, chills or abnormal weight loss Eyes: Denies blurriness of vision Ears, nose, mouth, throat, and face: Denies mucositis or sore throat Respiratory: Denies cough, dyspnea or wheezes Cardiovascular: Denies palpitation, chest discomfort Gastrointestinal:  Denies  nausea, heartburn or change in bowel habits Skin: Denies abnormal skin rashes Lymphatics: Denies new lymphadenopathy or easy bruising Neurological:Denies numbness, tingling or new weaknesses Behavioral/Psych: Mood is stable, no new changes  Extremities: No lower extremity edema Breast:  denies any pain or lumps or nodules in either breasts All other systems were reviewed with the patient and are negative.  I have reviewed the past medical history, past surgical history, social history and family history with the patient and they are unchanged from previous note.  ALLERGIES:  is allergic to other.  MEDICATIONS:  Current Outpatient Prescriptions  Medication Sig Dispense Refill  . Calcium Carb-Cholecalciferol (CALCIUM 600+D3 PO) Take 1 tablet by mouth 2 (two) times daily.    . Cholecalciferol (VITAMIN D3) 1000 UNITS CAPS Take 1,000 Units by mouth daily.     .Marland Kitchenlevothyroxine (SYNTHROID, LEVOTHROID) 88 MCG tablet TAKE 1 TABLET BY MOUTH DAILY EXCEPT TAKE 1/2 TABLET EVERY WEDNESDAY; MUST HAVE LABS FOR FURTHER REFILLS 30 tablet 0  . lidocaine-prilocaine (EMLA) cream Apply to affected area once 30 g 3  . meclizine (ANTIVERT) 25 MG tablet TAKE 1/2 TABLET BY MOUTH THREE TIMES DAILY AS NEEDED (Patient taking differently: TAKE 1/2 TABLET (12.5 mg) BY MOUTH THREE TIMES DAILY AS NEEDED for vertigo) 15 tablet 2  . metoprolol tartrate (LOPRESSOR) 25 MG tablet 1 bid if BP averages > 140/90 on average (Patient taking differently: Take 25 mg by mouth 2 (two) times daily as needed (If BP averages > 140/90). ) 60 tablet 2  . Multiple Vitamins-Minerals (CENTRUM SILVER PO) Take 1 tablet by  mouth daily.     . Multiple Vitamins-Minerals (PRESERVISION/LUTEIN) CAPS Take 1 capsule by mouth 2 (two) times daily.     . ondansetron (ZOFRAN) 8 MG tablet Take 1 tablet (8 mg total) by mouth 2 (two) times daily as needed (Nausea or vomiting). 30 tablet 1  . prochlorperazine (COMPAZINE) 10 MG tablet Take 1 tablet (10 mg total) by  mouth every 6 (six) hours as needed (Nausea or vomiting). 30 tablet 1  . simvastatin (ZOCOR) 40 MG tablet Take 20 mg by mouth every evening.    . simvastatin (ZOCOR) 40 MG tablet TAKE 1 TABLET BY MOUTH DAILY 90 tablet 1   No current facility-administered medications for this visit.    Facility-Administered Medications Ordered in Other Visits  Medication Dose Route Frequency Provider Last Rate Last Dose  . heparin lock flush 100 unit/mL  500 Units Intravenous Once PRN [name], MD      . sodium chloride 0.9 % injection 10 mL  10 mL Intravenous PRN [name], MD   10 mL at 06/27/16 0931    PHYSICAL EXAMINATION: ECOG PERFORMANCE STATUS: 1 - Symptomatic but completely ambulatory  Vitals:   06/27/16 0957  BP: (!) 172/79  Pulse: 72  Resp: 18  Temp: 98.2 F (36.8 C)   Filed Weights   06/27/16 0957  Weight: 117 lb 12.8 oz (53.4 kg)    GENERAL:alert, no distress and comfortable SKIN: skin color, texture, turgor are normal, no rashes or significant lesions EYES: normal, Conjunctiva are pink and non-injected, sclera clear OROPHARYNX:no exudate, no erythema and lips, buccal mucosa, and tongue normal  NECK: supple, thyroid normal size, non-tender, without nodularity LYMPH:  no palpable lymphadenopathy in the cervical, axillary or inguinal LUNGS: clear to auscultation and percussion with normal breathing effort HEART: regular rate & rhythm and no murmurs and no lower extremity edema ABDOMEN:abdomen soft, non-tender and normal bowel sounds MUSCULOSKELETAL:no cyanosis of digits and no clubbing  NEURO: alert & oriented x 3 with fluent speech, no focal motor/sensory deficits EXTREMITIES: No lower extremity edema  LABORATORY DATA:  I have reviewed the data as listed   Chemistry      Component Value Date/Time   NA 138 06/20/2016 0854   K 4.3 06/20/2016 0854   CL 102 09/15/2014 1018   CO2 26 06/20/2016 0854   BUN 20.1 06/20/2016 0854   CREATININE 0.8 06/20/2016 0854        Component Value Date/Time   CALCIUM 9.6 06/20/2016 0854   ALKPHOS 60 06/20/2016 0854   AST 21 06/20/2016 0854   ALT 18 06/20/2016 0854   BILITOT <0.30 06/20/2016 0854       Lab Results  Component Value Date   WBC 6.2 06/27/2016   HGB 11.0 (L) 06/27/2016   HCT 33.4 (L) 06/27/2016   MCV 96.7 06/27/2016   PLT 290 06/27/2016   NEUTROABS 3.8 06/27/2016     ASSESSMENT & PLAN:  Breast cancer of upper-outer quadrant of left female breast ([place]) Left breast biopsy 04/12/2016: IDC grade 2-3, left axillary LN positive for metastatic IDC; mammogram revealed a 5 cm solid mass left breast 12:00 anterior depth. ER 0%, PR 0%, Ki-67 80%, HER-2 positive ratio 7.86, gene copy #16.5  Breast MRI: 05/16/2016:Left breast solid with cystic appearance OUQ 1:00 crossing into the medial left breast 4.7 x 3.3 x 4.2 cm; second 76m nodule left breast UOQ: Posterior intramammary/low axillary lymph node, indeterminate left axillary lymph nodes, skin thickening Bone scan and CT chest abdomen pelvis 05/10/2016: No metastatic  disease Clinical stage: T2 N1 M0 stage IIB  Treatment plan: 1. Neoadjuvant chemotherapy with Taxol Herceptin Perjeta. Taxol to be given weekly 12, Herceptin Perjeta to be given every 3 weeks 6 cycles 2. Mastectomy with sentinel lymph node biopsy/selective axillary node dissection 3. +/-adjuvant radiation ------------------------------------------------------------------------------------------------------------------------------------------------------- Current treatment: Cycle 7 Taxol; cycle 3 Herceptin Perjeta; Taxol being given weekly X 12, Herceptin Perjeta every 3 weeks 6 cycles Echocardiogram 05/04/2016: EF 55-60%  Chemotherapy toxicities: 1. Grade 1 anemia 2. grade 1 fatigue 3. Grade 1 nausea: Improved with Zofran 4. Grade 1 diarrhea: Improved with Imodium  Monitoring closely for chemotherapy toxicities  Return to clinic in 3 weeks for cycle 10 Taxol and cycle 4  Herceptin Perjeta.   No orders of the defined types were placed in this encounter.  The patient has a good understanding of the overall plan. she agrees with it. she will call with any problems that may develop before the next visit here.   Rulon Eisenmenger, MD 06/27/16

## 2016-06-27 NOTE — Patient Instructions (Signed)
[facility] Discharge Instructions for Patients Receiving Chemotherapy  Today you received the following chemotherapy agents:  Herceptin, Perjeta, and Taxol.  To help prevent nausea and vomiting after your treatment, we encourage you to take your nausea medication as directed.   If you develop nausea and vomiting that is not controlled by your nausea medication, call the clinic.   BELOW ARE SYMPTOMS THAT SHOULD BE REPORTED IMMEDIATELY:  *FEVER GREATER THAN 100.5 F  *CHILLS WITH OR WITHOUT FEVER  NAUSEA AND VOMITING THAT IS NOT CONTROLLED WITH YOUR NAUSEA MEDICATION  *UNUSUAL SHORTNESS OF BREATH  *UNUSUAL BRUISING OR BLEEDING  TENDERNESS IN MOUTH AND THROAT WITH OR WITHOUT PRESENCE OF ULCERS  *URINARY PROBLEMS  *BOWEL PROBLEMS  UNUSUAL RASH Items with * indicate a potential emergency and should be followed up as soon as possible.  Feel free to call the clinic you have any questions or concerns. The clinic phone number is [phone].  Please show the [place] at check-in to the Emergency Department and triage nurse.

## 2016-07-04 ENCOUNTER — Ambulatory Visit: Payer: PPO

## 2016-07-04 ENCOUNTER — Other Ambulatory Visit (HOSPITAL_BASED_OUTPATIENT_CLINIC_OR_DEPARTMENT_OTHER): Payer: PPO

## 2016-07-04 ENCOUNTER — Ambulatory Visit (HOSPITAL_BASED_OUTPATIENT_CLINIC_OR_DEPARTMENT_OTHER): Payer: PPO

## 2016-07-04 VITALS — BP 172/74 | HR 59 | Temp 97.8°F | Resp 17

## 2016-07-04 DIAGNOSIS — C50412 Malignant neoplasm of upper-outer quadrant of left female breast: Secondary | ICD-10-CM

## 2016-07-04 DIAGNOSIS — Z5111 Encounter for antineoplastic chemotherapy: Secondary | ICD-10-CM

## 2016-07-04 DIAGNOSIS — Z95828 Presence of other vascular implants and grafts: Secondary | ICD-10-CM

## 2016-07-04 LAB — CBC WITH DIFFERENTIAL/PLATELET
BASO%: 1.2 % (ref 0.0–2.0)
BASOS ABS: 0.1 10*3/uL (ref 0.0–0.1)
EOS ABS: 0.1 10*3/uL (ref 0.0–0.5)
EOS%: 2 % (ref 0.0–7.0)
HEMATOCRIT: 31.4 % — AB (ref 34.8–46.6)
HEMOGLOBIN: 10.3 g/dL — AB (ref 11.6–15.9)
LYMPH#: 1.7 10*3/uL (ref 0.9–3.3)
LYMPH%: 25.4 % (ref 14.0–49.7)
MCH: 31.9 pg (ref 25.1–34.0)
MCHC: 33 g/dL (ref 31.5–36.0)
MCV: 96.8 fL (ref 79.5–101.0)
MONO#: 0.8 10*3/uL (ref 0.1–0.9)
MONO%: 11.5 % (ref 0.0–14.0)
NEUT%: 59.9 % (ref 38.4–76.8)
NEUTROS ABS: 3.9 10*3/uL (ref 1.5–6.5)
PLATELETS: 300 10*3/uL (ref 145–400)
RBC: 3.24 10*6/uL — ABNORMAL LOW (ref 3.70–5.45)
RDW: 14.1 % (ref 11.2–14.5)
WBC: 6.6 10*3/uL (ref 3.9–10.3)

## 2016-07-04 LAB — COMPREHENSIVE METABOLIC PANEL
ALBUMIN: 3.5 g/dL (ref 3.5–5.0)
ALK PHOS: 64 U/L (ref 40–150)
ALT: 23 U/L (ref 0–55)
AST: 24 U/L (ref 5–34)
Anion Gap: 7 mEq/L (ref 3–11)
BUN: 17.5 mg/dL (ref 7.0–26.0)
CO2: 27 mEq/L (ref 22–29)
CREATININE: 0.8 mg/dL (ref 0.6–1.1)
Calcium: 9.4 mg/dL (ref 8.4–10.4)
Chloride: 101 mEq/L (ref 98–109)
EGFR: 73 mL/min/{1.73_m2} — ABNORMAL LOW (ref >90)
GLUCOSE: 104 mg/dL (ref 70–140)
POTASSIUM: 4.3 meq/L (ref 3.5–5.1)
SODIUM: 135 meq/L — AB (ref 136–145)
Total Bilirubin: 0.24 mg/dL (ref 0.20–1.20)
Total Protein: 7.1 g/dL (ref 6.4–8.3)

## 2016-07-04 MED ORDER — FAMOTIDINE IN NACL 20-0.9 MG/50ML-% IV SOLN
INTRAVENOUS | Status: AC
Start: 2016-07-04 — End: 2016-07-04
  Filled 2016-07-04: qty 50

## 2016-07-04 MED ORDER — SODIUM CHLORIDE 0.9% FLUSH
10.0000 mL | INTRAVENOUS | Status: DC | PRN
  Administered 2016-07-04: 10 mL
  Filled 2016-07-04: qty 10

## 2016-07-04 MED ORDER — SODIUM CHLORIDE 0.9 % IV SOLN
Freq: Once | INTRAVENOUS | Status: AC
  Administered 2016-07-04: 13:00:00 via INTRAVENOUS

## 2016-07-04 MED ORDER — PACLITAXEL CHEMO INJECTION 300 MG/50ML
50.0000 mg/m2 | Freq: Once | INTRAVENOUS | Status: AC
  Administered 2016-07-04: 78 mg via INTRAVENOUS
  Filled 2016-07-04: qty 13

## 2016-07-04 MED ORDER — HEPARIN SOD (PORK) LOCK FLUSH 100 UNIT/ML IV SOLN
500.0000 [IU] | Freq: Once | INTRAVENOUS | Status: AC | PRN
  Administered 2016-07-04: 500 [IU]
  Filled 2016-07-04: qty 5

## 2016-07-04 MED ORDER — DIPHENHYDRAMINE HCL 50 MG/ML IJ SOLN
INTRAMUSCULAR | Status: AC
  Filled 2016-07-04: qty 1

## 2016-07-04 MED ORDER — FAMOTIDINE IN NACL 20-0.9 MG/50ML-% IV SOLN
20.0000 mg | Freq: Once | INTRAVENOUS | Status: AC
  Administered 2016-07-04: 20 mg via INTRAVENOUS

## 2016-07-04 MED ORDER — DIPHENHYDRAMINE HCL 50 MG/ML IJ SOLN
12.5000 mg | Freq: Once | INTRAMUSCULAR | Status: AC
  Administered 2016-07-04: 12.5 mg via INTRAVENOUS

## 2016-07-04 MED ORDER — SODIUM CHLORIDE 0.9 % IJ SOLN
10.0000 mL | INTRAMUSCULAR | Status: DC | PRN
  Administered 2016-07-04: 10 mL via INTRAVENOUS
  Filled 2016-07-04: qty 10

## 2016-07-04 NOTE — Patient Instructions (Signed)
[facility] Discharge Instructions for Patients Receiving Chemotherapy  Today you received the following chemotherapy agents Taxol   To help prevent nausea and vomiting after your treatment, we encourage you to take your nausea medication as directed.   If you develop nausea and vomiting that is not controlled by your nausea medication, call the clinic.   BELOW ARE SYMPTOMS THAT SHOULD BE REPORTED IMMEDIATELY:  *FEVER GREATER THAN 100.5 F  *CHILLS WITH OR WITHOUT FEVER  NAUSEA AND VOMITING THAT IS NOT CONTROLLED WITH YOUR NAUSEA MEDICATION  *UNUSUAL SHORTNESS OF BREATH  *UNUSUAL BRUISING OR BLEEDING  TENDERNESS IN MOUTH AND THROAT WITH OR WITHOUT PRESENCE OF ULCERS  *URINARY PROBLEMS  *BOWEL PROBLEMS  UNUSUAL RASH Items with * indicate a potential emergency and should be followed up as soon as possible.  Feel free to call the clinic you have any questions or concerns. The clinic phone number is [phone].  Please show the CHEMO ALERT CARD at check-in to the Emergency Department and triage nurse.   

## 2016-07-11 ENCOUNTER — Other Ambulatory Visit (HOSPITAL_BASED_OUTPATIENT_CLINIC_OR_DEPARTMENT_OTHER): Payer: PPO

## 2016-07-11 ENCOUNTER — Ambulatory Visit: Payer: PPO

## 2016-07-11 ENCOUNTER — Ambulatory Visit (HOSPITAL_BASED_OUTPATIENT_CLINIC_OR_DEPARTMENT_OTHER): Payer: PPO

## 2016-07-11 VITALS — BP 173/65 | HR 60 | Temp 98.5°F

## 2016-07-11 DIAGNOSIS — Z5111 Encounter for antineoplastic chemotherapy: Secondary | ICD-10-CM

## 2016-07-11 DIAGNOSIS — C50412 Malignant neoplasm of upper-outer quadrant of left female breast: Secondary | ICD-10-CM

## 2016-07-11 DIAGNOSIS — Z95828 Presence of other vascular implants and grafts: Secondary | ICD-10-CM

## 2016-07-11 LAB — CBC WITH DIFFERENTIAL/PLATELET
BASO%: 0.9 % (ref 0.0–2.0)
Basophils Absolute: 0.1 10*3/uL (ref 0.0–0.1)
EOS%: 2.4 % (ref 0.0–7.0)
Eosinophils Absolute: 0.2 10*3/uL (ref 0.0–0.5)
HCT: 31.6 % — ABNORMAL LOW (ref 34.8–46.6)
HEMOGLOBIN: 10.4 g/dL — AB (ref 11.6–15.9)
LYMPH%: 30.5 % (ref 14.0–49.7)
MCH: 32.2 pg (ref 25.1–34.0)
MCHC: 32.9 g/dL (ref 31.5–36.0)
MCV: 97.7 fL (ref 79.5–101.0)
MONO#: 0.8 10*3/uL (ref 0.1–0.9)
MONO%: 12.6 % (ref 0.0–14.0)
NEUT%: 53.6 % (ref 38.4–76.8)
NEUTROS ABS: 3.4 10*3/uL (ref 1.5–6.5)
PLATELETS: 293 10*3/uL (ref 145–400)
RBC: 3.24 10*6/uL — AB (ref 3.70–5.45)
RDW: 14.2 % (ref 11.2–14.5)
WBC: 6.3 10*3/uL (ref 3.9–10.3)
lymph#: 1.9 10*3/uL (ref 0.9–3.3)

## 2016-07-11 LAB — COMPREHENSIVE METABOLIC PANEL
ALBUMIN: 3.6 g/dL (ref 3.5–5.0)
ALT: 23 U/L (ref 0–55)
AST: 27 U/L (ref 5–34)
Alkaline Phosphatase: 60 U/L (ref 40–150)
Anion Gap: 9 mEq/L (ref 3–11)
BILIRUBIN TOTAL: 0.26 mg/dL (ref 0.20–1.20)
BUN: 17.2 mg/dL (ref 7.0–26.0)
CO2: 28 meq/L (ref 22–29)
CREATININE: 0.8 mg/dL (ref 0.6–1.1)
Calcium: 9.7 mg/dL (ref 8.4–10.4)
Chloride: 100 mEq/L (ref 98–109)
EGFR: 72 mL/min/{1.73_m2} — ABNORMAL LOW (ref >90)
GLUCOSE: 93 mg/dL (ref 70–140)
Potassium: 4.5 mEq/L (ref 3.5–5.1)
Sodium: 137 mEq/L (ref 136–145)
TOTAL PROTEIN: 7.2 g/dL (ref 6.4–8.3)

## 2016-07-11 MED ORDER — SODIUM CHLORIDE 0.9% FLUSH
10.0000 mL | INTRAVENOUS | Status: DC | PRN
  Administered 2016-07-11: 10 mL
  Filled 2016-07-11: qty 10

## 2016-07-11 MED ORDER — FAMOTIDINE IN NACL 20-0.9 MG/50ML-% IV SOLN
20.0000 mg | Freq: Once | INTRAVENOUS | Status: AC
  Administered 2016-07-11: 20 mg via INTRAVENOUS

## 2016-07-11 MED ORDER — DIPHENHYDRAMINE HCL 50 MG/ML IJ SOLN
INTRAMUSCULAR | Status: AC
  Filled 2016-07-11: qty 1

## 2016-07-11 MED ORDER — PACLITAXEL CHEMO INJECTION 300 MG/50ML
50.0000 mg/m2 | Freq: Once | INTRAVENOUS | Status: AC
  Administered 2016-07-11: 78 mg via INTRAVENOUS
  Filled 2016-07-11: qty 13

## 2016-07-11 MED ORDER — FAMOTIDINE IN NACL 20-0.9 MG/50ML-% IV SOLN
INTRAVENOUS | Status: AC
  Filled 2016-07-11: qty 50

## 2016-07-11 MED ORDER — SODIUM CHLORIDE 0.9 % IV SOLN
Freq: Once | INTRAVENOUS | Status: AC
  Administered 2016-07-11: 14:00:00 via INTRAVENOUS

## 2016-07-11 MED ORDER — SODIUM CHLORIDE 0.9 % IJ SOLN
10.0000 mL | INTRAMUSCULAR | Status: DC | PRN
  Administered 2016-07-11: 10 mL via INTRAVENOUS
  Filled 2016-07-11: qty 10

## 2016-07-11 MED ORDER — HEPARIN SOD (PORK) LOCK FLUSH 100 UNIT/ML IV SOLN
500.0000 [IU] | Freq: Once | INTRAVENOUS | Status: AC | PRN
  Administered 2016-07-11: 500 [IU]
  Filled 2016-07-11: qty 5

## 2016-07-11 MED ORDER — DIPHENHYDRAMINE HCL 50 MG/ML IJ SOLN
12.5000 mg | Freq: Once | INTRAMUSCULAR | Status: AC
  Administered 2016-07-11: 12.5 mg via INTRAVENOUS

## 2016-07-11 NOTE — Patient Instructions (Signed)
[facility] Discharge Instructions for Patients Receiving Chemotherapy  Today you received the following chemotherapy agents:  Taxol  To help prevent nausea and vomiting after your treatment, we encourage you to take your nausea medication as prescribed.   If you develop nausea and vomiting that is not controlled by your nausea medication, call the clinic.   BELOW ARE SYMPTOMS THAT SHOULD BE REPORTED IMMEDIATELY:  *FEVER GREATER THAN 100.5 F  *CHILLS WITH OR WITHOUT FEVER  NAUSEA AND VOMITING THAT IS NOT CONTROLLED WITH YOUR NAUSEA MEDICATION  *UNUSUAL SHORTNESS OF BREATH  *UNUSUAL BRUISING OR BLEEDING  TENDERNESS IN MOUTH AND THROAT WITH OR WITHOUT PRESENCE OF ULCERS  *URINARY PROBLEMS  *BOWEL PROBLEMS  UNUSUAL RASH Items with * indicate a potential emergency and should be followed up as soon as possible.  Feel free to call the clinic you have any questions or concerns. The clinic phone number is [phone].  Please show the CHEMO ALERT CARD at check-in to the Emergency Department and triage nurse.   

## 2016-07-13 DIAGNOSIS — F1020 Alcohol dependence, uncomplicated: Secondary | ICD-10-CM | POA: Diagnosis not present

## 2016-07-13 DIAGNOSIS — H93293 Other abnormal auditory perceptions, bilateral: Secondary | ICD-10-CM | POA: Diagnosis not present

## 2016-07-13 DIAGNOSIS — H6123 Impacted cerumen, bilateral: Secondary | ICD-10-CM | POA: Diagnosis not present

## 2016-07-13 DIAGNOSIS — J343 Hypertrophy of nasal turbinates: Secondary | ICD-10-CM | POA: Diagnosis not present

## 2016-07-18 ENCOUNTER — Other Ambulatory Visit (HOSPITAL_BASED_OUTPATIENT_CLINIC_OR_DEPARTMENT_OTHER): Payer: PPO

## 2016-07-18 ENCOUNTER — Ambulatory Visit (HOSPITAL_BASED_OUTPATIENT_CLINIC_OR_DEPARTMENT_OTHER): Payer: PPO

## 2016-07-18 VITALS — BP 151/67 | HR 66 | Temp 98.5°F | Resp 18

## 2016-07-18 DIAGNOSIS — Z5111 Encounter for antineoplastic chemotherapy: Secondary | ICD-10-CM | POA: Diagnosis not present

## 2016-07-18 DIAGNOSIS — C50412 Malignant neoplasm of upper-outer quadrant of left female breast: Secondary | ICD-10-CM

## 2016-07-18 DIAGNOSIS — Z5112 Encounter for antineoplastic immunotherapy: Secondary | ICD-10-CM | POA: Diagnosis not present

## 2016-07-18 DIAGNOSIS — Z171 Estrogen receptor negative status [ER-]: Principal | ICD-10-CM

## 2016-07-18 LAB — CBC WITH DIFFERENTIAL/PLATELET
BASO%: 0.9 % (ref 0.0–2.0)
Basophils Absolute: 0.1 10*3/uL (ref 0.0–0.1)
EOS%: 1.7 % (ref 0.0–7.0)
Eosinophils Absolute: 0.1 10*3/uL (ref 0.0–0.5)
HCT: 31.8 % — ABNORMAL LOW (ref 34.8–46.6)
HEMOGLOBIN: 10.4 g/dL — AB (ref 11.6–15.9)
LYMPH%: 23.1 % (ref 14.0–49.7)
MCH: 32 pg (ref 25.1–34.0)
MCHC: 32.7 g/dL (ref 31.5–36.0)
MCV: 97.8 fL (ref 79.5–101.0)
MONO#: 0.7 10*3/uL (ref 0.1–0.9)
MONO%: 10.6 % (ref 0.0–14.0)
NEUT%: 63.7 % (ref 38.4–76.8)
NEUTROS ABS: 4.1 10*3/uL (ref 1.5–6.5)
Platelets: 289 10*3/uL (ref 145–400)
RBC: 3.25 10*6/uL — AB (ref 3.70–5.45)
RDW: 14.1 % (ref 11.2–14.5)
WBC: 6.4 10*3/uL (ref 3.9–10.3)
lymph#: 1.5 10*3/uL (ref 0.9–3.3)

## 2016-07-18 LAB — COMPREHENSIVE METABOLIC PANEL
ALBUMIN: 3.5 g/dL (ref 3.5–5.0)
ALK PHOS: 64 U/L (ref 40–150)
ALT: 28 U/L (ref 0–55)
AST: 28 U/L (ref 5–34)
Anion Gap: 9 mEq/L (ref 3–11)
BUN: 17.6 mg/dL (ref 7.0–26.0)
CO2: 27 meq/L (ref 22–29)
Calcium: 9.4 mg/dL (ref 8.4–10.4)
Chloride: 103 mEq/L (ref 98–109)
Creatinine: 0.8 mg/dL (ref 0.6–1.1)
EGFR: 67 mL/min/{1.73_m2} — ABNORMAL LOW (ref >90)
GLUCOSE: 118 mg/dL (ref 70–140)
POTASSIUM: 4.2 meq/L (ref 3.5–5.1)
SODIUM: 139 meq/L (ref 136–145)
TOTAL PROTEIN: 7.1 g/dL (ref 6.4–8.3)
Total Bilirubin: 0.29 mg/dL (ref 0.20–1.20)

## 2016-07-18 MED ORDER — PACLITAXEL CHEMO INJECTION 300 MG/50ML
50.0000 mg/m2 | Freq: Once | INTRAVENOUS | Status: AC
  Administered 2016-07-18: 78 mg via INTRAVENOUS
  Filled 2016-07-18: qty 13

## 2016-07-18 MED ORDER — SODIUM CHLORIDE 0.9 % IV SOLN
Freq: Once | INTRAVENOUS | Status: AC
  Administered 2016-07-18: 10:00:00 via INTRAVENOUS

## 2016-07-18 MED ORDER — ACETAMINOPHEN 325 MG PO TABS
ORAL_TABLET | ORAL | Status: AC
  Filled 2016-07-18: qty 2

## 2016-07-18 MED ORDER — FAMOTIDINE IN NACL 20-0.9 MG/50ML-% IV SOLN
INTRAVENOUS | Status: AC
  Filled 2016-07-18: qty 50

## 2016-07-18 MED ORDER — DIPHENHYDRAMINE HCL 50 MG/ML IJ SOLN
INTRAMUSCULAR | Status: AC
  Filled 2016-07-18: qty 1

## 2016-07-18 MED ORDER — SODIUM CHLORIDE 0.9% FLUSH
10.0000 mL | INTRAVENOUS | Status: DC | PRN
  Administered 2016-07-18: 10 mL
  Filled 2016-07-18: qty 10

## 2016-07-18 MED ORDER — TRASTUZUMAB CHEMO 150 MG IV SOLR
6.0000 mg/kg | Freq: Once | INTRAVENOUS | Status: AC
  Administered 2016-07-18: 315 mg via INTRAVENOUS
  Filled 2016-07-18: qty 15

## 2016-07-18 MED ORDER — FAMOTIDINE IN NACL 20-0.9 MG/50ML-% IV SOLN
20.0000 mg | Freq: Once | INTRAVENOUS | Status: AC
  Administered 2016-07-18: 20 mg via INTRAVENOUS

## 2016-07-18 MED ORDER — DIPHENHYDRAMINE HCL 50 MG/ML IJ SOLN
12.5000 mg | Freq: Once | INTRAMUSCULAR | Status: AC
  Administered 2016-07-18: 12.5 mg via INTRAVENOUS

## 2016-07-18 MED ORDER — ACETAMINOPHEN 325 MG PO TABS
650.0000 mg | ORAL_TABLET | Freq: Once | ORAL | Status: AC
  Administered 2016-07-18: 650 mg via ORAL

## 2016-07-18 MED ORDER — HEPARIN SOD (PORK) LOCK FLUSH 100 UNIT/ML IV SOLN
500.0000 [IU] | Freq: Once | INTRAVENOUS | Status: AC | PRN
  Administered 2016-07-18: 500 [IU]
  Filled 2016-07-18: qty 5

## 2016-07-18 MED ORDER — PERTUZUMAB CHEMO INJECTION 420 MG/14ML
420.0000 mg | Freq: Once | INTRAVENOUS | Status: AC
  Administered 2016-07-18: 420 mg via INTRAVENOUS
  Filled 2016-07-18: qty 14

## 2016-07-18 NOTE — Patient Instructions (Signed)
[facility] Discharge Instructions for Patients Receiving Chemotherapy  Today you received the following chemotherapy agents: Taxol, Herceptin and Perjeta  To help prevent nausea and vomiting after your treatment, we encourage you to take your nausea medication as directed.    If you develop nausea and vomiting that is not controlled by your nausea medication, call the clinic.   BELOW ARE SYMPTOMS THAT SHOULD BE REPORTED IMMEDIATELY:  *FEVER GREATER THAN 100.5 F  *CHILLS WITH OR WITHOUT FEVER  NAUSEA AND VOMITING THAT IS NOT CONTROLLED WITH YOUR NAUSEA MEDICATION  *UNUSUAL SHORTNESS OF BREATH  *UNUSUAL BRUISING OR BLEEDING  TENDERNESS IN MOUTH AND THROAT WITH OR WITHOUT PRESENCE OF ULCERS  *URINARY PROBLEMS  *BOWEL PROBLEMS  UNUSUAL RASH Items with * indicate a potential emergency and should be followed up as soon as possible.  Feel free to call the clinic you have any questions or concerns. The clinic phone number is [phone].  Please show the CHEMO ALERT CARD at check-in to the Emergency Department and triage nurse.   

## 2016-07-25 ENCOUNTER — Other Ambulatory Visit (HOSPITAL_BASED_OUTPATIENT_CLINIC_OR_DEPARTMENT_OTHER): Payer: PPO

## 2016-07-25 ENCOUNTER — Ambulatory Visit: Payer: PPO

## 2016-07-25 ENCOUNTER — Ambulatory Visit (HOSPITAL_BASED_OUTPATIENT_CLINIC_OR_DEPARTMENT_OTHER): Payer: PPO

## 2016-07-25 VITALS — BP 175/60 | HR 64 | Temp 98.0°F | Resp 18

## 2016-07-25 DIAGNOSIS — C50412 Malignant neoplasm of upper-outer quadrant of left female breast: Secondary | ICD-10-CM

## 2016-07-25 DIAGNOSIS — Z5111 Encounter for antineoplastic chemotherapy: Secondary | ICD-10-CM

## 2016-07-25 DIAGNOSIS — Z95828 Presence of other vascular implants and grafts: Secondary | ICD-10-CM

## 2016-07-25 LAB — CBC WITH DIFFERENTIAL/PLATELET
BASO%: 0.8 % (ref 0.0–2.0)
BASOS ABS: 0 10*3/uL (ref 0.0–0.1)
EOS%: 2.5 % (ref 0.0–7.0)
Eosinophils Absolute: 0.1 10*3/uL (ref 0.0–0.5)
HEMATOCRIT: 32.2 % — AB (ref 34.8–46.6)
HGB: 10.6 g/dL — ABNORMAL LOW (ref 11.6–15.9)
LYMPH#: 1.5 10*3/uL (ref 0.9–3.3)
LYMPH%: 28.9 % (ref 14.0–49.7)
MCH: 32.4 pg (ref 25.1–34.0)
MCHC: 32.9 g/dL (ref 31.5–36.0)
MCV: 98.5 fL (ref 79.5–101.0)
MONO#: 0.7 10*3/uL (ref 0.1–0.9)
MONO%: 12.5 % (ref 0.0–14.0)
NEUT#: 2.9 10*3/uL (ref 1.5–6.5)
NEUT%: 55.3 % (ref 38.4–76.8)
PLATELETS: 288 10*3/uL (ref 145–400)
RBC: 3.27 10*6/uL — AB (ref 3.70–5.45)
RDW: 14.1 % (ref 11.2–14.5)
WBC: 5.3 10*3/uL (ref 3.9–10.3)

## 2016-07-25 LAB — COMPREHENSIVE METABOLIC PANEL
ALT: 23 U/L (ref 0–55)
ANION GAP: 8 meq/L (ref 3–11)
AST: 23 U/L (ref 5–34)
Albumin: 3.5 g/dL (ref 3.5–5.0)
Alkaline Phosphatase: 64 U/L (ref 40–150)
BUN: 20.5 mg/dL (ref 7.0–26.0)
CALCIUM: 9.7 mg/dL (ref 8.4–10.4)
CHLORIDE: 103 meq/L (ref 98–109)
CO2: 27 meq/L (ref 22–29)
CREATININE: 0.8 mg/dL (ref 0.6–1.1)
EGFR: 67 mL/min/{1.73_m2} — AB (ref >90)
Glucose: 90 mg/dl (ref 70–140)
POTASSIUM: 4.7 meq/L (ref 3.5–5.1)
Sodium: 138 mEq/L (ref 136–145)
Total Bilirubin: 0.35 mg/dL (ref 0.20–1.20)
Total Protein: 7 g/dL (ref 6.4–8.3)

## 2016-07-25 MED ORDER — HEPARIN SOD (PORK) LOCK FLUSH 100 UNIT/ML IV SOLN
500.0000 [IU] | Freq: Once | INTRAVENOUS | Status: AC | PRN
  Administered 2016-07-25: 500 [IU]
  Filled 2016-07-25: qty 5

## 2016-07-25 MED ORDER — DIPHENHYDRAMINE HCL 50 MG/ML IJ SOLN
12.5000 mg | Freq: Once | INTRAMUSCULAR | Status: AC
Start: 2016-07-25 — End: 2016-07-25
  Administered 2016-07-25: 12.5 mg via INTRAVENOUS

## 2016-07-25 MED ORDER — SODIUM CHLORIDE 0.9% FLUSH
10.0000 mL | INTRAVENOUS | Status: DC | PRN
  Administered 2016-07-25: 10 mL
  Filled 2016-07-25: qty 10

## 2016-07-25 MED ORDER — SODIUM CHLORIDE 0.9 % IJ SOLN
10.0000 mL | INTRAMUSCULAR | Status: DC | PRN
  Administered 2016-07-25: 10 mL via INTRAVENOUS
  Filled 2016-07-25: qty 10

## 2016-07-25 MED ORDER — DIPHENHYDRAMINE HCL 50 MG/ML IJ SOLN
INTRAMUSCULAR | Status: AC
  Filled 2016-07-25: qty 1

## 2016-07-25 MED ORDER — FAMOTIDINE IN NACL 20-0.9 MG/50ML-% IV SOLN
20.0000 mg | Freq: Once | INTRAVENOUS | Status: AC
  Administered 2016-07-25: 20 mg via INTRAVENOUS

## 2016-07-25 MED ORDER — FAMOTIDINE IN NACL 20-0.9 MG/50ML-% IV SOLN
INTRAVENOUS | Status: AC
  Filled 2016-07-25: qty 50

## 2016-07-25 MED ORDER — SODIUM CHLORIDE 0.9 % IV SOLN
Freq: Once | INTRAVENOUS | Status: AC
  Administered 2016-07-25: 10:00:00 via INTRAVENOUS

## 2016-07-25 MED ORDER — PALONOSETRON HCL INJECTION 0.25 MG/5ML
INTRAVENOUS | Status: AC
  Filled 2016-07-25: qty 5

## 2016-07-25 MED ORDER — PACLITAXEL CHEMO INJECTION 300 MG/50ML
50.0000 mg/m2 | Freq: Once | INTRAVENOUS | Status: AC
  Administered 2016-07-25: 78 mg via INTRAVENOUS
  Filled 2016-07-25: qty 13

## 2016-07-25 NOTE — Patient Instructions (Signed)
[facility] Discharge Instructions for Patients Receiving Chemotherapy  Today you received the following chemotherapy agents Taxol   To help prevent nausea and vomiting after your treatment, we encourage you to take your nausea medication as directed.   If you develop nausea and vomiting that is not controlled by your nausea medication, call the clinic.   BELOW ARE SYMPTOMS THAT SHOULD BE REPORTED IMMEDIATELY:  *FEVER GREATER THAN 100.5 F  *CHILLS WITH OR WITHOUT FEVER  NAUSEA AND VOMITING THAT IS NOT CONTROLLED WITH YOUR NAUSEA MEDICATION  *UNUSUAL SHORTNESS OF BREATH  *UNUSUAL BRUISING OR BLEEDING  TENDERNESS IN MOUTH AND THROAT WITH OR WITHOUT PRESENCE OF ULCERS  *URINARY PROBLEMS  *BOWEL PROBLEMS  UNUSUAL RASH Items with * indicate a potential emergency and should be followed up as soon as possible.  Feel free to call the clinic you have any questions or concerns. The clinic phone number is [phone].  Please show the CHEMO ALERT CARD at check-in to the Emergency Department and triage nurse.   

## 2016-07-27 ENCOUNTER — Ambulatory Visit (HOSPITAL_COMMUNITY)
Admission: RE | Admit: 2016-07-27 | Discharge: 2016-07-27 | Disposition: A | Discharge status: Home / Self Care | Payer: PPO | Source: Ambulatory Visit | Attending: Internal Medicine | Admitting: Internal Medicine

## 2016-07-27 ENCOUNTER — Ambulatory Visit (HOSPITAL_BASED_OUTPATIENT_CLINIC_OR_DEPARTMENT_OTHER)
Admission: RE | Admit: 2016-07-27 | Discharge: 2016-07-27 | Disposition: A | Discharge status: Home / Self Care | Payer: PPO | Source: Ambulatory Visit | Attending: Internal Medicine | Admitting: Internal Medicine

## 2016-07-27 ENCOUNTER — Encounter (HOSPITAL_COMMUNITY): Payer: Self-pay | Admitting: Internal Medicine

## 2016-07-27 ENCOUNTER — Other Ambulatory Visit (HOSPITAL_COMMUNITY): Payer: Self-pay | Admitting: *Deleted

## 2016-07-27 VITALS — BP 156/64 | HR 66 | Wt 118.5 lb

## 2016-07-27 DIAGNOSIS — Z79899 Other long term (current) drug therapy: Secondary | ICD-10-CM | POA: Diagnosis not present

## 2016-07-27 DIAGNOSIS — Z171 Estrogen receptor negative status [ER-]: Secondary | ICD-10-CM | POA: Insufficient documentation

## 2016-07-27 DIAGNOSIS — C50011 Malignant neoplasm of nipple and areola, right female breast: Secondary | ICD-10-CM

## 2016-07-27 DIAGNOSIS — M8580 Other specified disorders of bone density and structure, unspecified site: Secondary | ICD-10-CM | POA: Insufficient documentation

## 2016-07-27 DIAGNOSIS — C773 Secondary and unspecified malignant neoplasm of axilla and upper limb lymph nodes: Secondary | ICD-10-CM | POA: Insufficient documentation

## 2016-07-27 DIAGNOSIS — E039 Hypothyroidism, unspecified: Secondary | ICD-10-CM | POA: Diagnosis not present

## 2016-07-27 DIAGNOSIS — I10 Essential (primary) hypertension: Secondary | ICD-10-CM

## 2016-07-27 DIAGNOSIS — H3530 Unspecified macular degeneration: Secondary | ICD-10-CM | POA: Insufficient documentation

## 2016-07-27 DIAGNOSIS — C50412 Malignant neoplasm of upper-outer quadrant of left female breast: Secondary | ICD-10-CM | POA: Diagnosis not present

## 2016-07-27 DIAGNOSIS — E785 Hyperlipidemia, unspecified: Secondary | ICD-10-CM | POA: Insufficient documentation

## 2016-07-27 MED ORDER — AMLODIPINE BESYLATE 5 MG PO TABS: 5.0000 mg | ORAL_TABLET | Freq: Every day | ORAL | 3 refills | Status: DC

## 2016-07-27 NOTE — Progress Notes (Signed)
*  PRELIMINARY RESULTS* Echocardiogram 2D Echocardiogram has been performed.  Leavy Cella 07/27/2016, 11:14 AM

## 2016-07-27 NOTE — Progress Notes (Signed)
CARDIO-ONCOLOGY CLINIC NOTE  Referring Physician: Lindi Adie Primary Cardiologist: Linzey Ramser   HPI:  Jessica Sherman 82 y.o. female with h/o hyperlipidemia, hypothyroidism with no known h/o heart disease referred by Dr. Lindi Adie for enrollment into the cardio-oncology program due to left breast cancer.  Diagnosed with left breast cancer 8/17.  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 LN positive for metastatic disease.    SUMMARY OF ONCOLOGIC HISTORY:       Breast cancer of upper-outer quadrant of left female breast ([place])   04/12/2016 Initial Diagnosis    Left breast biopsy: IDC grade 2-3, left axillary LN positive for metastatic IDC; mammogram revealed a 5 cm solid mass left breast 12:00 anterior depth      Doing well. Tolerating chemo without problem. About to finish chemo and start Herceptin/Perjeta q 3 weeks.  No CP or SOB. No edema, orthopnea or PND.  BP running high.  ECHO 8/17: EF 55-60%. Grade I DD. Mild TR/MR. LS' 11.5 GLS -20.0% ECHO 11/1: EF 55-60%. Grade I DD. Mild TR/MR. LS' 11.0 GLS -18.3%   Past Medical History:  Diagnosis Date  . Hx of colonic polyps   . Hyperlipidemia   . Hypothyroidism   . Macular degeneration, dry   . Osteopenia    spine BMD T score - 1.69  . Prediabetes    FBS 136  on 08/29/2010; A1c 6.2%  . Vertigo     Current Outpatient Prescriptions  Medication Sig Dispense Refill  . Calcium Carb-Cholecalciferol (CALCIUM 600+D3 PO) Take 1 tablet by mouth 2 (two) times daily.    . Cholecalciferol (VITAMIN D3) 1000 UNITS CAPS Take 1,000 Units by mouth daily.     Marland Kitchen levothyroxine (SYNTHROID, LEVOTHROID) 88 MCG tablet TAKE 1 TABLET BY MOUTH DAILY EXCEPT TAKE 1/2 TABLET EVERY WEDNESDAY; MUST HAVE LABS FOR FURTHER REFILLS 30 tablet 0  . lidocaine-prilocaine (EMLA) cream Apply to affected area  once 30 g 3  . Multiple Vitamins-Minerals (CENTRUM SILVER PO) Take 1 tablet by mouth daily.     . Multiple Vitamins-Minerals (PRESERVISION/LUTEIN) CAPS Take 1 capsule by mouth 2 (two) times daily.     . ondansetron (ZOFRAN) 8 MG tablet Take 1 tablet (8 mg total) by mouth 2 (two) times daily as needed (Nausea or vomiting). 30 tablet 1  . simvastatin (ZOCOR) 40 MG tablet TAKE 1 TABLET BY MOUTH DAILY 90 tablet 1  . meclizine (ANTIVERT) 25 MG tablet TAKE 1/2 TABLET BY MOUTH THREE TIMES DAILY AS NEEDED (Patient not taking: Reported on 07/27/2016) 15 tablet 2  . prochlorperazine (COMPAZINE) 10 MG tablet Take 1 tablet (10 mg total) by mouth every 6 (six) hours as needed (Nausea or vomiting). (Patient not taking: Reported on 07/27/2016) 30 tablet 1   No current facility-administered medications for this encounter.     Allergies  Allergen Reactions  . Other Nausea And Vomiting    Anesthesia "makes me very sick", Please pre-medicate to control nausea and vomiting      Social History   Social History  . Marital status: Married    Spouse name: N/A  . Number of children: N/A  . Years of education: N/A   Occupational History  . retired    Social History Main Topics  . Smoking status: Never Smoker  . Smokeless tobacco: Never Used  . Alcohol use No  Comment:    . Drug use: No  . Sexual activity: Not on file   Other Topics Concern  . Not on file   Social History Narrative   Regular exercise- yes 19md w/o symptoms      Family History  Problem Relation Age of Onset  . Osteoporosis Mother   . Leukemia Mother   . Cancer Maternal Uncle     ? stomach  . Aneurysm Father     AAA  . Heart attack Neg Hx     Vitals:   07/27/16 1121  BP: (!) 156/64  Pulse: 66  SpO2: 100%  Weight: 118 lb 8 oz (53.8 kg)    PHYSICAL EXAM: General:  Well appearing. Frail. No respiratory difficulty HEENT: normal Neck: supple. JVP 7 Carotids 2+ bilat; no bruits. No lymphadenopathy or thryomegaly  appreciated. Cor: PMI nondisplaced. Regular rate & rhythm. 2/6 TR Lungs: clear Abdomen: soft, nontender, nondistended. No hepatosplenomegaly. No bruits or masses. Good bowel sounds. Extremities: no cyanosis, clubbing, rash, edema Neuro: alert & oriented x 3, cranial nerves grossly intact. moves all 4 extremities w/o difficulty. Affect pleasant.   ASSESSMENT & PLAN: 1. Breast cancer of upper-outer quadrant of left female breast ([place] Left breast biopsy 04/12/2016: IDC grade 2-3, left axillary LN positive for metastatic IDC; mammogram revealed a 5 cm solid mass left breast 12:00 anterior depth. --undergoing neoadjuvant chemotherapy with Taxol Herceptin Perjeta. Taxol to be given weekly 12, Herceptin Perjeta to be given every 3 weeks 6 cycles. Followed by mastectomy with sentinel lymph node biopsy/selective axillary node dissection. +/- adjuvant radiation --I reviewed echos personally. EF and Doppler parameters stable. No HF on exam. Continue Herceptin.   2. Hypertension --BP elevated here and at all oncology visits. Moderate LVH on echo. Ideally would like to start ARB but potassium runs on high side. So I favor amlodipine 5 mg daily. Cautioned to watch for swelling and constipation.    Benecio Kluger,MD 11:57 AM

## 2016-07-27 NOTE — Patient Instructions (Signed)
Start Amlodipine 5mg  (1 Tab) once daily  Echo and Follow up in 3 months

## 2016-07-27 NOTE — Addendum Note (Signed)
Encounter addended by: Kennieth Rad, RN on: 07/27/2016 12:10 PM<BR>    Actions taken: Order list changed, Diagnosis association updated, Sign clinical note

## 2016-08-01 ENCOUNTER — Other Ambulatory Visit (HOSPITAL_BASED_OUTPATIENT_CLINIC_OR_DEPARTMENT_OTHER): Payer: PPO

## 2016-08-01 ENCOUNTER — Encounter: Payer: Self-pay | Admitting: *Deleted

## 2016-08-01 ENCOUNTER — Ambulatory Visit: Payer: PPO

## 2016-08-01 ENCOUNTER — Ambulatory Visit (HOSPITAL_BASED_OUTPATIENT_CLINIC_OR_DEPARTMENT_OTHER): Payer: PPO

## 2016-08-01 VITALS — BP 139/67 | HR 64 | Temp 98.5°F | Resp 17

## 2016-08-01 DIAGNOSIS — C50412 Malignant neoplasm of upper-outer quadrant of left female breast: Secondary | ICD-10-CM

## 2016-08-01 DIAGNOSIS — Z5111 Encounter for antineoplastic chemotherapy: Secondary | ICD-10-CM | POA: Diagnosis not present

## 2016-08-01 DIAGNOSIS — Z95828 Presence of other vascular implants and grafts: Secondary | ICD-10-CM

## 2016-08-01 LAB — CBC WITH DIFFERENTIAL/PLATELET
BASO%: 1 % (ref 0.0–2.0)
BASOS ABS: 0.1 10*3/uL (ref 0.0–0.1)
EOS ABS: 0.1 10*3/uL (ref 0.0–0.5)
EOS%: 1.5 % (ref 0.0–7.0)
HCT: 31.2 % — ABNORMAL LOW (ref 34.8–46.6)
HGB: 10.2 g/dL — ABNORMAL LOW (ref 11.6–15.9)
LYMPH%: 25.7 % (ref 14.0–49.7)
MCH: 32.1 pg (ref 25.1–34.0)
MCHC: 32.6 g/dL (ref 31.5–36.0)
MCV: 98.6 fL (ref 79.5–101.0)
MONO#: 0.7 10*3/uL (ref 0.1–0.9)
MONO%: 10.6 % (ref 0.0–14.0)
NEUT%: 61.2 % (ref 38.4–76.8)
NEUTROS ABS: 4 10*3/uL (ref 1.5–6.5)
Platelets: 264 10*3/uL (ref 145–400)
RBC: 3.16 10*6/uL — AB (ref 3.70–5.45)
RDW: 14.2 % (ref 11.2–14.5)
WBC: 6.5 10*3/uL (ref 3.9–10.3)
lymph#: 1.7 10*3/uL (ref 0.9–3.3)

## 2016-08-01 LAB — COMPREHENSIVE METABOLIC PANEL
ALT: 22 U/L (ref 0–55)
AST: 20 U/L (ref 5–34)
Albumin: 3.5 g/dL (ref 3.5–5.0)
Alkaline Phosphatase: 60 U/L (ref 40–150)
Anion Gap: 7 mEq/L (ref 3–11)
BUN: 17 mg/dL (ref 7.0–26.0)
CHLORIDE: 104 meq/L (ref 98–109)
CO2: 27 meq/L (ref 22–29)
Calcium: 9.6 mg/dL (ref 8.4–10.4)
Creatinine: 0.7 mg/dL (ref 0.6–1.1)
EGFR: 78 mL/min/{1.73_m2} — AB (ref >90)
GLUCOSE: 97 mg/dL (ref 70–140)
POTASSIUM: 4.3 meq/L (ref 3.5–5.1)
SODIUM: 139 meq/L (ref 136–145)
Total Bilirubin: 0.25 mg/dL (ref 0.20–1.20)
Total Protein: 7 g/dL (ref 6.4–8.3)

## 2016-08-01 MED ORDER — DIPHENHYDRAMINE HCL 50 MG/ML IJ SOLN
INTRAMUSCULAR | Status: AC
  Filled 2016-08-01: qty 1

## 2016-08-01 MED ORDER — SODIUM CHLORIDE 0.9 % IV SOLN
Freq: Once | INTRAVENOUS | Status: AC
  Administered 2016-08-01: 11:00:00 via INTRAVENOUS

## 2016-08-01 MED ORDER — PACLITAXEL CHEMO INJECTION 300 MG/50ML
50.0000 mg/m2 | Freq: Once | INTRAVENOUS | Status: AC
  Administered 2016-08-01: 78 mg via INTRAVENOUS
  Filled 2016-08-01: qty 13

## 2016-08-01 MED ORDER — SODIUM CHLORIDE 0.9 % IJ SOLN
10.0000 mL | INTRAMUSCULAR | Status: DC | PRN
  Filled 2016-08-01: qty 10

## 2016-08-01 MED ORDER — HEPARIN SOD (PORK) LOCK FLUSH 100 UNIT/ML IV SOLN
500.0000 [IU] | Freq: Once | INTRAVENOUS | Status: AC | PRN
  Administered 2016-08-01: 500 [IU]
  Filled 2016-08-01: qty 5

## 2016-08-01 MED ORDER — FAMOTIDINE IN NACL 20-0.9 MG/50ML-% IV SOLN
20.0000 mg | Freq: Once | INTRAVENOUS | Status: AC
  Administered 2016-08-01: 20 mg via INTRAVENOUS

## 2016-08-01 MED ORDER — SODIUM CHLORIDE 0.9% FLUSH
10.0000 mL | INTRAVENOUS | Status: DC | PRN
  Administered 2016-08-01: 10 mL
  Filled 2016-08-01: qty 10

## 2016-08-01 MED ORDER — DIPHENHYDRAMINE HCL 50 MG/ML IJ SOLN
12.5000 mg | Freq: Once | INTRAMUSCULAR | Status: AC
  Administered 2016-08-01: 12.5 mg via INTRAVENOUS

## 2016-08-01 MED ORDER — FAMOTIDINE IN NACL 20-0.9 MG/50ML-% IV SOLN
INTRAVENOUS | Status: AC
  Filled 2016-08-01: qty 50

## 2016-08-01 NOTE — Patient Instructions (Signed)
[facility] Discharge Instructions for Patients Receiving Chemotherapy  Today you received the following chemotherapy agents Taxol   To help prevent nausea and vomiting after your treatment, we encourage you to take your nausea medication as directed.   If you develop nausea and vomiting that is not controlled by your nausea medication, call the clinic.   BELOW ARE SYMPTOMS THAT SHOULD BE REPORTED IMMEDIATELY:  *FEVER GREATER THAN 100.5 F  *CHILLS WITH OR WITHOUT FEVER  NAUSEA AND VOMITING THAT IS NOT CONTROLLED WITH YOUR NAUSEA MEDICATION  *UNUSUAL SHORTNESS OF BREATH  *UNUSUAL BRUISING OR BLEEDING  TENDERNESS IN MOUTH AND THROAT WITH OR WITHOUT PRESENCE OF ULCERS  *URINARY PROBLEMS  *BOWEL PROBLEMS  UNUSUAL RASH Items with * indicate a potential emergency and should be followed up as soon as possible.  Feel free to call the clinic you have any questions or concerns. The clinic phone number is [phone].  Please show the CHEMO ALERT CARD at check-in to the Emergency Department and triage nurse.   

## 2016-08-07 ENCOUNTER — Other Ambulatory Visit: Payer: Self-pay | Admitting: Internal Medicine

## 2016-08-07 NOTE — Assessment & Plan Note (Signed)
Left breast biopsy 04/12/2016: IDC grade 2-3, left axillary LN positive for metastatic IDC; mammogram revealed a 5 cm solid mass left breast 12:00 anterior depth. ER 0%, PR 0%, Ki-67 80%, HER-2 positive ratio 7.86, gene copy #16.5  Breast MRI: 05/16/2016:Left breast solid with cystic appearance OUQ 1:00 crossing into the medial left breast 4.7 x 3.3 x 4.2 cm; second 68m nodule left breast UOQ: Posterior intramammary/low axillary lymph node, indeterminate left axillary lymph nodes, skin thickening Bone scan and CT chest abdomen pelvis 05/10/2016: No metastatic disease Clinical stage: T2 N1 M0 stage IIB  Treatment plan: 1. Neoadjuvant chemotherapy with Taxol Herceptin Perjeta. Taxol to be given weekly 12, Herceptin Perjeta to be given every 3 weeks 6 cycles 2. Mastectomy with sentinel lymph node biopsy/selective axillary node dissection 3. +/-adjuvant radiation ------------------------------------------------------------------------------------------------------------------------------------------------------- Current treatment: Completed cycle 4Herceptin Perjeta; Taxol weekly X 12 completed 08/01/16, Today is cycle 5 HP Echocardiogram 05/04/2016: EF 55-60%  Chemotherapy toxicities: 1. Grade 1 anemia 2. grade 1 fatigue 3. Grade 1 nausea: Improved with Zofran 4. Grade 1 diarrhea: Improved with Imodium  Monitoring closely for chemotherapy toxicities  Return to clinic in 3weeks for cycle 6 Herceptin Perjeta.

## 2016-08-08 ENCOUNTER — Ambulatory Visit (HOSPITAL_BASED_OUTPATIENT_CLINIC_OR_DEPARTMENT_OTHER): Payer: PPO | Admitting: Hematology and Oncology

## 2016-08-08 ENCOUNTER — Encounter: Payer: Self-pay | Admitting: Hematology and Oncology

## 2016-08-08 ENCOUNTER — Encounter: Payer: Self-pay | Admitting: *Deleted

## 2016-08-08 ENCOUNTER — Other Ambulatory Visit: Payer: Self-pay | Admitting: Hematology and Oncology

## 2016-08-08 ENCOUNTER — Ambulatory Visit (HOSPITAL_BASED_OUTPATIENT_CLINIC_OR_DEPARTMENT_OTHER): Payer: PPO

## 2016-08-08 ENCOUNTER — Other Ambulatory Visit (HOSPITAL_BASED_OUTPATIENT_CLINIC_OR_DEPARTMENT_OTHER): Payer: PPO

## 2016-08-08 VITALS — BP 143/56 | HR 64 | Temp 97.6°F | Resp 18 | Ht 62.0 in | Wt 117.5 lb

## 2016-08-08 DIAGNOSIS — Z171 Estrogen receptor negative status [ER-]: Secondary | ICD-10-CM

## 2016-08-08 DIAGNOSIS — Z5112 Encounter for antineoplastic immunotherapy: Secondary | ICD-10-CM

## 2016-08-08 DIAGNOSIS — C50412 Malignant neoplasm of upper-outer quadrant of left female breast: Secondary | ICD-10-CM

## 2016-08-08 DIAGNOSIS — D6481 Anemia due to antineoplastic chemotherapy: Secondary | ICD-10-CM | POA: Diagnosis not present

## 2016-08-08 DIAGNOSIS — Z95828 Presence of other vascular implants and grafts: Secondary | ICD-10-CM

## 2016-08-08 LAB — CBC WITH DIFFERENTIAL/PLATELET
BASO%: 0.6 % (ref 0.0–2.0)
Basophils Absolute: 0 10*3/uL (ref 0.0–0.1)
EOS%: 2.3 % (ref 0.0–7.0)
Eosinophils Absolute: 0.2 10*3/uL (ref 0.0–0.5)
HCT: 32.3 % — ABNORMAL LOW (ref 34.8–46.6)
HEMOGLOBIN: 10.6 g/dL — AB (ref 11.6–15.9)
LYMPH%: 31.4 % (ref 14.0–49.7)
MCH: 32.5 pg (ref 25.1–34.0)
MCHC: 32.8 g/dL (ref 31.5–36.0)
MCV: 99.1 fL (ref 79.5–101.0)
MONO#: 0.9 10*3/uL (ref 0.1–0.9)
MONO%: 12.2 % (ref 0.0–14.0)
NEUT%: 53.5 % (ref 38.4–76.8)
NEUTROS ABS: 3.8 10*3/uL (ref 1.5–6.5)
Platelets: 299 10*3/uL (ref 145–400)
RBC: 3.26 10*6/uL — AB (ref 3.70–5.45)
RDW: 14 % (ref 11.2–14.5)
WBC: 7.1 10*3/uL (ref 3.9–10.3)
lymph#: 2.2 10*3/uL (ref 0.9–3.3)

## 2016-08-08 LAB — COMPREHENSIVE METABOLIC PANEL
ALT: 25 U/L (ref 0–55)
AST: 23 U/L (ref 5–34)
Albumin: 3.6 g/dL (ref 3.5–5.0)
Alkaline Phosphatase: 66 U/L (ref 40–150)
Anion Gap: 7 mEq/L (ref 3–11)
BILIRUBIN TOTAL: 0.29 mg/dL (ref 0.20–1.20)
BUN: 14 mg/dL (ref 7.0–26.0)
CO2: 29 meq/L (ref 22–29)
Calcium: 9.9 mg/dL (ref 8.4–10.4)
Chloride: 101 mEq/L (ref 98–109)
Creatinine: 0.7 mg/dL (ref 0.6–1.1)
EGFR: 74 mL/min/{1.73_m2} — AB (ref >90)
GLUCOSE: 101 mg/dL (ref 70–140)
Potassium: 4.6 mEq/L (ref 3.5–5.1)
SODIUM: 137 meq/L (ref 136–145)
TOTAL PROTEIN: 7.2 g/dL (ref 6.4–8.3)

## 2016-08-08 MED ORDER — SODIUM CHLORIDE 0.9 % IV SOLN
420.0000 mg | Freq: Once | INTRAVENOUS | Status: AC
  Administered 2016-08-08: 420 mg via INTRAVENOUS
  Filled 2016-08-08: qty 14

## 2016-08-08 MED ORDER — ACETAMINOPHEN 325 MG PO TABS
650.0000 mg | ORAL_TABLET | Freq: Once | ORAL | Status: AC
  Administered 2016-08-08: 650 mg via ORAL

## 2016-08-08 MED ORDER — SODIUM CHLORIDE 0.9% FLUSH
10.0000 mL | INTRAVENOUS | Status: DC | PRN
  Administered 2016-08-08: 10 mL
  Filled 2016-08-08: qty 10

## 2016-08-08 MED ORDER — DIPHENHYDRAMINE HCL 25 MG PO CAPS
25.0000 mg | ORAL_CAPSULE | Freq: Once | ORAL | Status: DC
  Administered 2016-08-08: 25 mg via ORAL

## 2016-08-08 MED ORDER — DIPHENHYDRAMINE HCL 25 MG PO CAPS
ORAL_CAPSULE | ORAL | Status: AC
  Filled 2016-08-08: qty 1

## 2016-08-08 MED ORDER — SODIUM CHLORIDE 0.9 % IV SOLN
Freq: Once | INTRAVENOUS | Status: AC
  Administered 2016-08-08: 11:00:00 via INTRAVENOUS

## 2016-08-08 MED ORDER — HEPARIN SOD (PORK) LOCK FLUSH 100 UNIT/ML IV SOLN
500.0000 [IU] | Freq: Once | INTRAVENOUS | Status: AC | PRN
  Administered 2016-08-08: 500 [IU]
  Filled 2016-08-08: qty 5

## 2016-08-08 MED ORDER — TRASTUZUMAB CHEMO 150 MG IV SOLR
6.0000 mg/kg | Freq: Once | INTRAVENOUS | Status: AC
  Administered 2016-08-08: 315 mg via INTRAVENOUS
  Filled 2016-08-08: qty 15

## 2016-08-08 MED ORDER — ACETAMINOPHEN 325 MG PO TABS
ORAL_TABLET | ORAL | Status: AC
  Filled 2016-08-08: qty 2

## 2016-08-08 MED ORDER — SODIUM CHLORIDE 0.9 % IJ SOLN
10.0000 mL | INTRAMUSCULAR | Status: DC | PRN
  Administered 2016-08-08: 10 mL via INTRAVENOUS
  Filled 2016-08-08: qty 10

## 2016-08-08 MED ORDER — DIPHENHYDRAMINE HCL 50 MG/ML IJ SOLN: 25.0000 mg | Freq: Once | INTRAMUSCULAR | Status: DC

## 2016-08-08 NOTE — Patient Instructions (Signed)
[facility] Discharge Instructions for Patients Receiving Chemotherapy  Today you received the following chemotherapy agents:  Herceptin, Perjeta  To help prevent nausea and vomiting after your treatment, we encourage you to take your nausea medication as prescribed.   If you develop nausea and vomiting that is not controlled by your nausea medication, call the clinic.   BELOW ARE SYMPTOMS THAT SHOULD BE REPORTED IMMEDIATELY:  *FEVER GREATER THAN 100.5 F  *CHILLS WITH OR WITHOUT FEVER  NAUSEA AND VOMITING THAT IS NOT CONTROLLED WITH YOUR NAUSEA MEDICATION  *UNUSUAL SHORTNESS OF BREATH  *UNUSUAL BRUISING OR BLEEDING  TENDERNESS IN MOUTH AND THROAT WITH OR WITHOUT PRESENCE OF ULCERS  *URINARY PROBLEMS  *BOWEL PROBLEMS  UNUSUAL RASH Items with * indicate a potential emergency and should be followed up as soon as possible.  Feel free to call the clinic you have any questions or concerns. The clinic phone number is [phone].  Please show the CHEMO ALERT CARD at check-in to the Emergency Department and triage nurse.   

## 2016-08-08 NOTE — Progress Notes (Signed)
Patient Care Team: [name], MD as PCP - General (Internal Medicine)  DIAGNOSIS:  Encounter Diagnosis  Name Primary?  . Malignant neoplasm of upper-outer quadrant of left breast in female, estrogen receptor negative ([place])     SUMMARY OF ONCOLOGIC HISTORY:   Breast cancer of upper-outer quadrant of left female breast ([place])   04/12/2016 Initial Diagnosis    Left breast biopsy: IDC grade 2-3, left axillary LN positive for metastatic IDC; mammogram revealed a 5 cm solid mass left breast 12:00 anterior depth, ER 0%, PR 0%, Ki-67 80%, HER-2 positive ratio 7.6, copy #16.5      05/10/2016 Imaging    Bone scan normal; CT CAP: left breast cancer, left axillary and subpectoral nodes are not pathologic by size criteriabut suspicious, no metastatic disease      05/16/2016 Breast MRI    Left breast solid with cystic appearance OUQ 1:00 crossing into the medial left breast 4.7 x 3.3 x 4.2 cm; second 23m nodule left breast UOQ: Posterior intramammary/low axillary lymph node, indeterminate left axillary lymph nodes, skin thickening      05/17/2016 -  Neo-Adjuvant Chemotherapy    Taxol weekly 12 Herceptin and Perjeta every 3 weeks 6       CHIEF COMPLIANT: Completed 12 weeks of Taxol, today is cycle 5 of Herceptin and Perjeta  INTERVAL HISTORY: Jessica JASKULSKIis a 82year old with above-mentioned history of left breast cancer currently on Taxol Herceptin and Perjeta on neoadjuvant chemotherapy. She completed 12 weeks of Taxol. Today's cycle 5 of Herceptin and Perjeta. She had tolerated chemotherapy extremely well. She had made very mild diarrhea which improved with Imodium. She does not have any nausea vomiting.  REVIEW OF SYSTEMS:   Constitutional: Denies fevers, chills or abnormal weight loss Eyes: Denies blurriness of vision Ears, nose, mouth, throat, and face: Denies mucositis or sore throat Respiratory: Denies cough, dyspnea or wheezes Cardiovascular: Denies palpitation, chest  discomfort Gastrointestinal:  Mild diarrhea Skin: Denies abnormal skin rashes Lymphatics: Denies new lymphadenopathy or easy bruising Neurological:Denies numbness, tingling or new weaknesses Behavioral/Psych: Mood is stable, no new changes  Extremities: No lower extremity edema Breast:  denies any pain or lumps or nodules in either breasts All other systems were reviewed with the patient and are negative.  I have reviewed the past medical history, past surgical history, social history and family history with the patient and they are unchanged from previous note.  ALLERGIES:  is allergic to other.  MEDICATIONS:  Current Outpatient Prescriptions  Medication Sig Dispense Refill  . amLODipine (NORVASC) 5 MG tablet Take 1 tablet (5 mg total) by mouth daily. 180 tablet 3  . Calcium Carb-Cholecalciferol (CALCIUM 600+D3 PO) Take 1 tablet by mouth 2 (two) times daily.    . Cholecalciferol (VITAMIN D3) 1000 UNITS CAPS Take 1,000 Units by mouth daily.     .Marland Kitchenlevothyroxine (SYNTHROID, LEVOTHROID) 88 MCG tablet TAKE 1 TABLET BY MOUTH DAILY EXCEPT TAKE 1/2 TABLET EVERY WEDNESDAY; MUST HAVE LABS FOR FURTHER REFILLS 30 tablet 0  . lidocaine-prilocaine (EMLA) cream Apply to affected area once 30 g 3  . meclizine (ANTIVERT) 25 MG tablet TAKE 1/2 TABLET BY MOUTH THREE TIMES DAILY AS NEEDED (Patient not taking: Reported on 07/27/2016) 15 tablet 2  . Multiple Vitamins-Minerals (CENTRUM SILVER PO) Take 1 tablet by mouth daily.     . Multiple Vitamins-Minerals (PRESERVISION/LUTEIN) CAPS Take 1 capsule by mouth 2 (two) times daily.     . ondansetron (ZOFRAN) 8 MG tablet Take 1 tablet (  8 mg total) by mouth 2 (two) times daily as needed (Nausea or vomiting). 30 tablet 1  . prochlorperazine (COMPAZINE) 10 MG tablet Take 1 tablet (10 mg total) by mouth every 6 (six) hours as needed (Nausea or vomiting). (Patient not taking: Reported on 07/27/2016) 30 tablet 1  . simvastatin (ZOCOR) 40 MG tablet TAKE 1 TABLET BY MOUTH  DAILY 90 tablet 1   No current facility-administered medications for this visit.     PHYSICAL EXAMINATION: ECOG PERFORMANCE STATUS: 1 - Symptomatic but completely ambulatory  Vitals:   08/08/16 0956  BP: (!) 143/56  Pulse: 64  Resp: 18  Temp: 97.6 F (36.4 C)   Filed Weights   08/08/16 0956  Weight: 117 lb 8 oz (53.3 kg)    GENERAL:alert, no distress and comfortable SKIN: skin color, texture, turgor are normal, no rashes or significant lesions EYES: normal, Conjunctiva are pink and non-injected, sclera clear OROPHARYNX:no exudate, no erythema and lips, buccal mucosa, and tongue normal  NECK: supple, thyroid normal size, non-tender, without nodularity LYMPH:  no palpable lymphadenopathy in the cervical, axillary or inguinal LUNGS: clear to auscultation and percussion with normal breathing effort HEART: regular rate & rhythm and no murmurs and no lower extremity edema ABDOMEN:abdomen soft, non-tender and normal bowel sounds MUSCULOSKELETAL:no cyanosis of digits and no clubbing  NEURO: alert & oriented x 3 with fluent speech, no focal motor/sensory deficits EXTREMITIES: No lower extremity edema  LABORATORY DATA:  I have reviewed the data as listed   Chemistry      Component Value Date/Time   NA 139 08/01/2016 0939   K 4.3 08/01/2016 0939   CL 102 09/15/2014 1018   CO2 27 08/01/2016 0939   BUN 17.0 08/01/2016 0939   CREATININE 0.7 08/01/2016 0939      Component Value Date/Time   CALCIUM 9.6 08/01/2016 0939   ALKPHOS 60 08/01/2016 0939   AST 20 08/01/2016 0939   ALT 22 08/01/2016 0939   BILITOT 0.25 08/01/2016 0939       Lab Results  Component Value Date   WBC 6.5 08/01/2016   HGB 10.2 (L) 08/01/2016   HCT 31.2 (L) 08/01/2016   MCV 98.6 08/01/2016   PLT 264 08/01/2016   NEUTROABS 4.0 08/01/2016    ASSESSMENT & PLAN:  Breast cancer of upper-outer quadrant of left female breast ([place]) Left breast biopsy 04/12/2016: IDC grade 2-3, left axillary LN positive  for metastatic IDC; mammogram revealed a 5 cm solid mass left breast 12:00 anterior depth. ER 0%, PR 0%, Ki-67 80%, HER-2 positive ratio 7.86, gene copy #16.5  Breast MRI: 05/16/2016:Left breast solid with cystic appearance OUQ 1:00 crossing into the medial left breast 4.7 x 3.3 x 4.2 cm; second 29m nodule left breast UOQ: Posterior intramammary/low axillary lymph node, indeterminate left axillary lymph nodes, skin thickening Bone scan and CT chest abdomen pelvis 05/10/2016: No metastatic disease Clinical stage: T2 N1 M0 stage IIB  Treatment plan: 1. Neoadjuvant chemotherapy with Taxol Herceptin Perjeta. Taxol to be given weekly 12, Herceptin Perjeta to be given every 3 weeks 6 cycles 2. Mastectomy with sentinel lymph node biopsy/selective axillary node dissection 3. +/-adjuvant radiation ------------------------------------------------------------------------------------------------------------------------------------------------------- Current treatment: Completed cycle 4Herceptin Perjeta; Taxol weekly X 12 completed 08/01/16, Today is cycle 5 HP Echocardiogram 05/04/2016: EF 55-60%  Chemotherapy toxicities: 1. Grade 1 anemia 2. grade 1 fatigue 3. Grade 1 nausea: Improved with Zofran 4. Grade 1 diarrhea: Improved with Imodium  Monitoring closely for chemotherapy toxicities  Return to clinic in 3weeks for  cycle 6 Herceptin Perjeta. She will be set up for a breast MRI 09/07/2016. I will see her in follow-up after that she will board on 09/13/2016  No orders of the defined types were placed in this encounter.  The patient has a good understanding of the overall plan. she agrees with it. she will call with any problems that may develop before the next visit here.   Rulon Eisenmenger, MD 08/08/16

## 2016-08-08 NOTE — Addendum Note (Signed)
Addended by: Thelma Barge MAY J on: 08/08/2016 11:23 AM   Modules accepted: Orders

## 2016-08-11 ENCOUNTER — Other Ambulatory Visit: Payer: Self-pay | Admitting: *Deleted

## 2016-08-11 DIAGNOSIS — C50412 Malignant neoplasm of upper-outer quadrant of left female breast: Secondary | ICD-10-CM

## 2016-08-29 ENCOUNTER — Ambulatory Visit: Payer: PPO

## 2016-08-29 ENCOUNTER — Encounter: Payer: Self-pay | Admitting: Hematology and Oncology

## 2016-08-29 ENCOUNTER — Ambulatory Visit (HOSPITAL_BASED_OUTPATIENT_CLINIC_OR_DEPARTMENT_OTHER): Payer: PPO | Admitting: Hematology and Oncology

## 2016-08-29 ENCOUNTER — Telehealth: Payer: Self-pay | Admitting: Hematology and Oncology

## 2016-08-29 ENCOUNTER — Ambulatory Visit (HOSPITAL_BASED_OUTPATIENT_CLINIC_OR_DEPARTMENT_OTHER): Payer: PPO

## 2016-08-29 ENCOUNTER — Other Ambulatory Visit (HOSPITAL_BASED_OUTPATIENT_CLINIC_OR_DEPARTMENT_OTHER): Payer: PPO

## 2016-08-29 DIAGNOSIS — D6481 Anemia due to antineoplastic chemotherapy: Secondary | ICD-10-CM | POA: Diagnosis not present

## 2016-08-29 DIAGNOSIS — Z171 Estrogen receptor negative status [ER-]: Secondary | ICD-10-CM

## 2016-08-29 DIAGNOSIS — C50412 Malignant neoplasm of upper-outer quadrant of left female breast: Secondary | ICD-10-CM

## 2016-08-29 DIAGNOSIS — Z5112 Encounter for antineoplastic immunotherapy: Secondary | ICD-10-CM | POA: Diagnosis not present

## 2016-08-29 DIAGNOSIS — Z95828 Presence of other vascular implants and grafts: Secondary | ICD-10-CM

## 2016-08-29 LAB — COMPREHENSIVE METABOLIC PANEL
ALBUMIN: 3.5 g/dL (ref 3.5–5.0)
ALK PHOS: 67 U/L (ref 40–150)
ALT: 21 U/L (ref 0–55)
AST: 22 U/L (ref 5–34)
Anion Gap: 6 mEq/L (ref 3–11)
BILIRUBIN TOTAL: 0.39 mg/dL (ref 0.20–1.20)
BUN: 24.7 mg/dL (ref 7.0–26.0)
CO2: 29 meq/L (ref 22–29)
CREATININE: 0.8 mg/dL (ref 0.6–1.1)
Calcium: 9.8 mg/dL (ref 8.4–10.4)
Chloride: 104 mEq/L (ref 98–109)
EGFR: 65 mL/min/{1.73_m2} — AB (ref >90)
GLUCOSE: 130 mg/dL (ref 70–140)
Potassium: 4.2 mEq/L (ref 3.5–5.1)
SODIUM: 139 meq/L (ref 136–145)
TOTAL PROTEIN: 7 g/dL (ref 6.4–8.3)

## 2016-08-29 LAB — CBC WITH DIFFERENTIAL/PLATELET
BASO%: 0.9 % (ref 0.0–2.0)
Basophils Absolute: 0.1 10*3/uL (ref 0.0–0.1)
EOS ABS: 0.2 10*3/uL (ref 0.0–0.5)
EOS%: 2.7 % (ref 0.0–7.0)
HCT: 32.8 % — ABNORMAL LOW (ref 34.8–46.6)
HEMOGLOBIN: 10.7 g/dL — AB (ref 11.6–15.9)
LYMPH%: 24.6 % (ref 14.0–49.7)
MCH: 31.9 pg (ref 25.1–34.0)
MCHC: 32.5 g/dL (ref 31.5–36.0)
MCV: 98.2 fL (ref 79.5–101.0)
MONO#: 0.8 10*3/uL (ref 0.1–0.9)
MONO%: 11.1 % (ref 0.0–14.0)
NEUT%: 60.7 % (ref 38.4–76.8)
NEUTROS ABS: 4.2 10*3/uL (ref 1.5–6.5)
PLATELETS: 232 10*3/uL (ref 145–400)
RBC: 3.34 10*6/uL — AB (ref 3.70–5.45)
RDW: 13.9 % (ref 11.2–14.5)
WBC: 6.9 10*3/uL (ref 3.9–10.3)
lymph#: 1.7 10*3/uL (ref 0.9–3.3)

## 2016-08-29 MED ORDER — ACETAMINOPHEN 325 MG PO TABS
650.0000 mg | ORAL_TABLET | Freq: Once | ORAL | Status: AC
  Administered 2016-08-29: 650 mg via ORAL

## 2016-08-29 MED ORDER — DIPHENHYDRAMINE HCL 50 MG/ML IJ SOLN
25.0000 mg | Freq: Once | INTRAMUSCULAR | Status: AC
  Administered 2016-08-29: 12:00:00 via INTRAVENOUS

## 2016-08-29 MED ORDER — DIPHENHYDRAMINE HCL 50 MG/ML IJ SOLN
INTRAMUSCULAR | Status: AC
  Filled 2016-08-29: qty 1

## 2016-08-29 MED ORDER — ACETAMINOPHEN 325 MG PO TABS
ORAL_TABLET | ORAL | Status: AC
  Filled 2016-08-29: qty 2

## 2016-08-29 MED ORDER — SODIUM CHLORIDE 0.9% FLUSH
10.0000 mL | INTRAVENOUS | Status: DC | PRN
  Administered 2016-08-29: 10 mL
  Filled 2016-08-29: qty 10

## 2016-08-29 MED ORDER — SODIUM CHLORIDE 0.9 % IV SOLN
Freq: Once | INTRAVENOUS | Status: AC
  Administered 2016-08-29: 12:00:00 via INTRAVENOUS

## 2016-08-29 MED ORDER — SODIUM CHLORIDE 0.9 % IV SOLN
420.0000 mg | Freq: Once | INTRAVENOUS | Status: AC
  Administered 2016-08-29: 420 mg via INTRAVENOUS
  Filled 2016-08-29: qty 14

## 2016-08-29 MED ORDER — SODIUM CHLORIDE 0.9 % IJ SOLN
10.0000 mL | INTRAMUSCULAR | Status: DC | PRN
  Administered 2016-08-29: 10 mL via INTRAVENOUS
  Filled 2016-08-29: qty 10

## 2016-08-29 MED ORDER — SODIUM CHLORIDE 0.9 % IV SOLN
6.0000 mg/kg | Freq: Once | INTRAVENOUS | Status: AC
  Administered 2016-08-29: 315 mg via INTRAVENOUS
  Filled 2016-08-29: qty 15

## 2016-08-29 MED ORDER — HEPARIN SOD (PORK) LOCK FLUSH 100 UNIT/ML IV SOLN
500.0000 [IU] | Freq: Once | INTRAVENOUS | Status: AC | PRN
  Administered 2016-08-29: 500 [IU]
  Filled 2016-08-29: qty 5

## 2016-08-29 NOTE — Telephone Encounter (Signed)
Patient came to scheduling to obtain her AVS report and calendars.

## 2016-08-29 NOTE — Progress Notes (Signed)
Patient Care Team: [name], MD as PCP - General (Internal Medicine)  DIAGNOSIS:  Encounter Diagnosis  Name Primary?  . Malignant neoplasm of upper-outer quadrant of left breast in female, estrogen receptor negative ([place])     SUMMARY OF ONCOLOGIC HISTORY:   Breast cancer of upper-outer quadrant of left female breast ([place])   04/12/2016 Initial Diagnosis    Left breast biopsy: IDC grade 2-3, left axillary LN positive for metastatic IDC; mammogram revealed a 5 cm solid mass left breast 12:00 anterior depth, ER 0%, PR 0%, Ki-67 80%, HER-2 positive ratio 7.6, copy #16.5      05/10/2016 Imaging    Bone scan normal; CT CAP: left breast cancer, left axillary and subpectoral nodes are not pathologic by size criteriabut suspicious, no metastatic disease      05/16/2016 Breast MRI    Left breast solid with cystic appearance OUQ 1:00 crossing into the medial left breast 4.7 x 3.3 x 4.2 cm; second 13m nodule left breast UOQ: Posterior intramammary/low axillary lymph node, indeterminate left axillary lymph nodes, skin thickening      05/17/2016 -  Neo-Adjuvant Chemotherapy    Taxol weekly 12 Herceptin and Perjeta every 3 weeks 6       CHIEF COMPLIANT: Cycle 6 Herceptin and Perjeta  INTERVAL HISTORY: Jessica CONVERYis a 82year old with above-mentioned history of left breast cancer currently on Taxol Herceptin and Perjeta on neoadjuvant chemotherapy. She completed 12 weeks of Taxol. Today's cycle 6 of Herceptin and Perjeta. She had tolerated chemotherapy extremely well. She had made very mild diarrhea which improved with Imodium. She does not have any nausea vomiting.  REVIEW OF SYSTEMS:   Constitutional: Denies fevers, chills or abnormal weight loss Eyes: Denies blurriness of vision Ears, nose, mouth, throat, and face: Denies mucositis or sore throat Respiratory: Denies cough, dyspnea or wheezes Cardiovascular: Denies palpitation, chest discomfort Gastrointestinal:  Denies nausea,  heartburn or change in bowel habits Skin: Denies abnormal skin rashes Lymphatics: Denies new lymphadenopathy or easy bruising Neurological:Denies numbness, tingling or new weaknesses Behavioral/Psych: Mood is stable, no new changes  Extremities: No lower extremity edema Breast:  denies any pain or lumps or nodules in either breasts All other systems were reviewed with the patient and are negative.  I have reviewed the past medical history, past surgical history, social history and family history with the patient and they are unchanged from previous note.  ALLERGIES:  is allergic to other.  MEDICATIONS:  Current Outpatient Prescriptions  Medication Sig Dispense Refill  . amLODipine (NORVASC) 5 MG tablet Take 1 tablet (5 mg total) by mouth daily. 180 tablet 3  . Calcium Carb-Cholecalciferol (CALCIUM 600+D3 PO) Take 1 tablet by mouth 2 (two) times daily.    . Cholecalciferol (VITAMIN D3) 1000 UNITS CAPS Take 1,000 Units by mouth daily.     .Marland Kitchenlevothyroxine (SYNTHROID, LEVOTHROID) 88 MCG tablet TAKE 1 TABLET BY MOUTH DAILY EXCEPT TAKE 1/2 TABLET EVERY WEDNESDAY; MUST HAVE LABS FOR FURTHER REFILLS 30 tablet 0  . lidocaine-prilocaine (EMLA) cream Apply to affected area once 30 g 3  . meclizine (ANTIVERT) 25 MG tablet TAKE 1/2 TABLET BY MOUTH THREE TIMES DAILY AS NEEDED (Patient not taking: Reported on 07/27/2016) 15 tablet 2  . Multiple Vitamins-Minerals (CENTRUM SILVER PO) Take 1 tablet by mouth daily.     . Multiple Vitamins-Minerals (PRESERVISION/LUTEIN) CAPS Take 1 capsule by mouth 2 (two) times daily.     . ondansetron (ZOFRAN) 8 MG tablet Take 1 tablet (8 mg  total) by mouth 2 (two) times daily as needed (Nausea or vomiting). 30 tablet 1  . prochlorperazine (COMPAZINE) 10 MG tablet Take 1 tablet (10 mg total) by mouth every 6 (six) hours as needed (Nausea or vomiting). (Patient not taking: Reported on 07/27/2016) 30 tablet 1  . simvastatin (ZOCOR) 40 MG tablet TAKE 1 TABLET BY MOUTH DAILY 90  tablet 1   No current facility-administered medications for this visit.    Facility-Administered Medications Ordered in Other Visits  Medication Dose Route Frequency Provider Last Rate Last Dose  . acetaminophen (TYLENOL) tablet 650 mg  650 mg Oral Once [name], MD      . diphenhydrAMINE (BENADRYL) injection 25 mg  25 mg Intravenous Once [name], MD      . pertuzumab (PERJETA) 420 mg in sodium chloride 0.9 % 250 mL chemo infusion  420 mg Intravenous Once [name], MD      . sodium chloride flush (NS) 0.9 % injection 10 mL  10 mL Intracatheter PRN [name], MD      . trastuzumab (HERCEPTIN) 315 mg in sodium chloride 0.9 % 250 mL chemo infusion  6 mg/kg (Treatment Plan Recorded) Intravenous Once [name], MD        PHYSICAL EXAMINATION: ECOG PERFORMANCE STATUS: 1 - Symptomatic but completely ambulatory  Vitals:   08/29/16 1048  BP: (!) 128/54  Pulse: 65  Resp: 18  Temp: 97.6 F (36.4 C)   Filed Weights   08/29/16 1048  Weight: 116 lb 4.8 oz (52.8 kg)    GENERAL:alert, no distress and comfortable SKIN: skin color, texture, turgor are normal, no rashes or significant lesions EYES: normal, Conjunctiva are pink and non-injected, sclera clear OROPHARYNX:no exudate, no erythema and lips, buccal mucosa, and tongue normal  NECK: supple, thyroid normal size, non-tender, without nodularity LYMPH:  no palpable lymphadenopathy in the cervical, axillary or inguinal LUNGS: clear to auscultation and percussion with normal breathing effort HEART: regular rate & rhythm and no murmurs and no lower extremity edema ABDOMEN:abdomen soft, non-tender and normal bowel sounds MUSCULOSKELETAL:no cyanosis of digits and no clubbing  NEURO: alert & oriented x 3 with fluent speech, no focal motor/sensory deficits EXTREMITIES: No lower extremity edema  LABORATORY DATA:  I have reviewed the data as listed   Chemistry      Component Value Date/Time   NA 139 08/29/2016 1010   K 4.2  08/29/2016 1010   CL 102 09/15/2014 1018   CO2 29 08/29/2016 1010   BUN 24.7 08/29/2016 1010   CREATININE 0.8 08/29/2016 1010      Component Value Date/Time   CALCIUM 9.8 08/29/2016 1010   ALKPHOS 67 08/29/2016 1010   AST 22 08/29/2016 1010   ALT 21 08/29/2016 1010   BILITOT 0.39 08/29/2016 1010       Lab Results  Component Value Date   WBC 6.9 08/29/2016   HGB 10.7 (L) 08/29/2016   HCT 32.8 (L) 08/29/2016   MCV 98.2 08/29/2016   PLT 232 08/29/2016   NEUTROABS 4.2 08/29/2016    ASSESSMENT & PLAN:  Breast cancer of upper-outer quadrant of left female breast ([place]) Left breast biopsy 04/12/2016: IDC grade 2-3, left axillary LN positive for metastatic IDC; mammogram revealed a 5 cm solid mass left breast 12:00 anterior depth. ER 0%, PR 0%, Ki-67 80%, HER-2 positive ratio 7.86, gene copy #16.5  Breast MRI: 05/16/2016:Left breast solid with cystic appearance OUQ 1:00 crossing into the medial left breast 4.7 x 3.3 x 4.2 cm; second  44m nodule left breast UOQ: Posterior intramammary/low axillary lymph node, indeterminate left axillary lymph nodes, skin thickening Bone scan and CT chest abdomen pelvis 05/10/2016: No metastatic disease Clinical stage: T2 N1 M0 stage IIB  Treatment plan: 1. Neoadjuvant chemotherapy with Taxol Herceptin Perjeta. Taxol to be given weekly 12, Herceptin Perjeta to be given every 3 weeks 6 cycles 2. Mastectomy with sentinel lymph node biopsy/selective axillary node dissection 3. +/-adjuvant radiation ------------------------------------------------------------------------------------------------------------------------------------------------------- Current treatment: Completed cycle 4Herceptin Perjeta; Taxol weekly X 12 completed 08/01/16, Today is cycle 6 HP Echocardiogram 05/04/2016: EF 55-60%  Chemotherapy toxicities: 1. Grade 1 anemia 2. grade 1 fatigue 3. Grade 1 nausea: Improved with Zofran 4. Grade 1 diarrhea: Improved with  Imodium  Monitoring closely for chemotherapy toxicities  She will be set up for a breast MRI 09/07/2016. I will see her in follow-up after that she will board on 09/13/2016   No orders of the defined types were placed in this encounter.  The patient has a good understanding of the overall plan. she agrees with it. she will call with any problems that may develop before the next visit here.   Jessica Eisenmenger MD 08/29/16

## 2016-08-29 NOTE — Assessment & Plan Note (Signed)
Left breast biopsy 04/12/2016: IDC grade 2-3, left axillary LN positive for metastatic IDC; mammogram revealed a 5 cm solid mass left breast 12:00 anterior depth. ER 0%, PR 0%, Ki-67 80%, HER-2 positive ratio 7.86, gene copy #16.5  Breast MRI: 05/16/2016:Left breast solid with cystic appearance OUQ 1:00 crossing into the medial left breast 4.7 x 3.3 x 4.2 cm; second 26m nodule left breast UOQ: Posterior intramammary/low axillary lymph node, indeterminate left axillary lymph nodes, skin thickening Bone scan and CT chest abdomen pelvis 05/10/2016: No metastatic disease Clinical stage: T2 N1 M0 stage IIB  Treatment plan: 1. Neoadjuvant chemotherapy with Taxol Herceptin Perjeta. Taxol to be given weekly 12, Herceptin Perjeta to be given every 3 weeks 6 cycles 2. Mastectomy with sentinel lymph node biopsy/selective axillary node dissection 3. +/-adjuvant radiation ------------------------------------------------------------------------------------------------------------------------------------------------------- Current treatment: Completed cycle 4Herceptin Perjeta; Taxol weekly X 12 completed 08/01/16, Today is cycle 6 HP Echocardiogram 05/04/2016: EF 55-60%  Chemotherapy toxicities: 1. Grade 1 anemia 2. grade 1 fatigue 3. Grade 1 nausea: Improved with Zofran 4. Grade 1 diarrhea: Improved with Imodium  Monitoring closely for chemotherapy toxicities  She will be set up for a breast MRI 09/07/2016. I will see her in follow-up after that she will board on 09/13/2016

## 2016-08-29 NOTE — Patient Instructions (Signed)
[facility] Discharge Instructions for Patients Receiving Chemotherapy  Today you received the following chemotherapy agents:  Herceptin, Perjeta  To help prevent nausea and vomiting after your treatment, we encourage you to take your nausea medication as prescribed.   If you develop nausea and vomiting that is not controlled by your nausea medication, call the clinic.   BELOW ARE SYMPTOMS THAT SHOULD BE REPORTED IMMEDIATELY:  *FEVER GREATER THAN 100.5 F  *CHILLS WITH OR WITHOUT FEVER  NAUSEA AND VOMITING THAT IS NOT CONTROLLED WITH YOUR NAUSEA MEDICATION  *UNUSUAL SHORTNESS OF BREATH  *UNUSUAL BRUISING OR BLEEDING  TENDERNESS IN MOUTH AND THROAT WITH OR WITHOUT PRESENCE OF ULCERS  *URINARY PROBLEMS  *BOWEL PROBLEMS  UNUSUAL RASH Items with * indicate a potential emergency and should be followed up as soon as possible.  Feel free to call the clinic you have any questions or concerns. The clinic phone number is [phone].  Please show the CHEMO ALERT CARD at check-in to the Emergency Department and triage nurse.   

## 2016-08-31 ENCOUNTER — Ambulatory Visit
Admission: RE | Admit: 2016-08-31 | Discharge: 2016-08-31 | Disposition: A | Discharge status: Home / Self Care | Payer: PPO | Source: Ambulatory Visit | Attending: Hematology and Oncology | Admitting: Hematology and Oncology

## 2016-08-31 ENCOUNTER — Other Ambulatory Visit: Payer: Self-pay | Admitting: Hematology and Oncology

## 2016-08-31 DIAGNOSIS — C50912 Malignant neoplasm of unspecified site of left female breast: Secondary | ICD-10-CM | POA: Diagnosis not present

## 2016-08-31 DIAGNOSIS — C50412 Malignant neoplasm of upper-outer quadrant of left female breast: Secondary | ICD-10-CM

## 2016-09-02 ENCOUNTER — Ambulatory Visit
Admission: RE | Admit: 2016-09-02 | Discharge: 2016-09-02 | Disposition: A | Discharge status: Home / Self Care | Payer: PPO | Source: Ambulatory Visit | Attending: Hematology and Oncology | Admitting: Hematology and Oncology

## 2016-09-02 DIAGNOSIS — C50912 Malignant neoplasm of unspecified site of left female breast: Secondary | ICD-10-CM | POA: Diagnosis not present

## 2016-09-02 DIAGNOSIS — C50412 Malignant neoplasm of upper-outer quadrant of left female breast: Secondary | ICD-10-CM

## 2016-09-02 MED ORDER — GADOBENATE DIMEGLUMINE 529 MG/ML IV SOLN
10.0000 mL | Freq: Once | INTRAVENOUS | Status: AC | PRN
  Administered 2016-09-02: 10 mL via INTRAVENOUS

## 2016-09-09 ENCOUNTER — Other Ambulatory Visit: Payer: Self-pay | Admitting: Internal Medicine

## 2016-09-13 ENCOUNTER — Other Ambulatory Visit: Payer: PPO

## 2016-09-13 ENCOUNTER — Ambulatory Visit: Payer: PPO | Admitting: Hematology and Oncology

## 2016-09-14 ENCOUNTER — Other Ambulatory Visit: Payer: Self-pay | Admitting: *Deleted

## 2016-09-14 MED ORDER — LEVOTHYROXINE SODIUM 88 MCG PO TABS: ORAL_TABLET | ORAL | 0 refills | Status: DC

## 2016-09-14 NOTE — Telephone Encounter (Signed)
Pt left msg on triage stating she is needing refill on her Levothyroxine pharmacy has sent request but have not heard back from office. Pt is due for CPX January will send 30 day until appt...[name]

## 2016-09-15 ENCOUNTER — Other Ambulatory Visit: Payer: Self-pay | Admitting: Surgery

## 2016-09-15 DIAGNOSIS — C50912 Malignant neoplasm of unspecified site of left female breast: Secondary | ICD-10-CM | POA: Diagnosis not present

## 2016-09-17 NOTE — Assessment & Plan Note (Addendum)
Left breast biopsy 04/12/2016: IDC grade 2-3, left axillary LN positive for metastatic IDC; mammogram revealed a 5 cm solid mass left breast 12:00 anterior depth. ER 0%, PR 0%, Ki-67 80%, HER-2 positive ratio 7.86, gene copy #16.5  Breast MRI: 05/16/2016:Left breast solid with cystic appearance OUQ 1:00 crossing into the medial left breast 4.7 x 3.3 x 4.2 cm; second 49m nodule left breast UOQ: Posterior intramammary/low axillary lymph node, indeterminate left axillary lymph nodes, skin thickening Bone scan and CT chest abdomen pelvis 05/10/2016: No metastatic disease Clinical stage: T2 N1 M0 stage IIB  Treatment plan: 1. Neoadjuvant chemotherapy with Taxol Herceptin Perjeta. Taxol to be given weekly 12, Herceptin Perjeta to be given every 3 weeks 6 cycles 05/17/16 to 08/29/16 2. Mastectomy with sentinel lymph node biopsy/selective axillary node dissection 3. +/-adjuvant radiation ------------------------------------------------------------------------------------------------------------------------------------------------------- Breast MRI 09/05/16: Complete imaging response  Plan: 1. Lumpectomy Vs Mastectomy 2. +/- adj RT 3. Maintenance Herceptin-Perjeta

## 2016-09-18 ENCOUNTER — Ambulatory Visit (HOSPITAL_BASED_OUTPATIENT_CLINIC_OR_DEPARTMENT_OTHER): Payer: PPO | Admitting: Hematology and Oncology

## 2016-09-18 ENCOUNTER — Encounter: Payer: Self-pay | Admitting: Hematology and Oncology

## 2016-09-18 DIAGNOSIS — Z171 Estrogen receptor negative status [ER-]: Secondary | ICD-10-CM

## 2016-09-18 DIAGNOSIS — C50412 Malignant neoplasm of upper-outer quadrant of left female breast: Secondary | ICD-10-CM

## 2016-09-18 DIAGNOSIS — C773 Secondary and unspecified malignant neoplasm of axilla and upper limb lymph nodes: Secondary | ICD-10-CM

## 2016-09-18 DIAGNOSIS — C50812 Malignant neoplasm of overlapping sites of left female breast: Secondary | ICD-10-CM

## 2016-09-18 NOTE — Progress Notes (Signed)
Patient Care Team: Jessica Rail, MD as PCP - General (Internal Medicine)  DIAGNOSIS:  Encounter Diagnosis  Name Primary?  . Malignant neoplasm of upper-outer quadrant of left breast in female, estrogen receptor negative ([place])     SUMMARY OF ONCOLOGIC HISTORY:   Breast cancer of upper-outer quadrant of left female breast ([place])   04/12/2016 Initial Diagnosis    Left breast biopsy: IDC grade 2-3, left axillary LN positive for metastatic IDC; mammogram revealed a 5 cm solid mass left breast 12:00 anterior depth, ER 0%, PR 0%, Ki-67 80%, HER-2 positive ratio 7.6, copy #16.5      05/10/2016 Imaging    Bone scan normal; CT CAP: left breast cancer, left axillary and subpectoral nodes are not pathologic by size criteriabut suspicious, no metastatic disease      05/16/2016 Breast MRI    Left breast solid with cystic appearance OUQ 1:00 crossing into the medial left breast 4.7 x 3.3 x 4.2 cm; second 37m nodule left breast UOQ: Posterior intramammary/low axillary lymph node, indeterminate left axillary lymph nodes, skin thickening      05/17/2016 - 08/29/2016 Neo-Adjuvant Chemotherapy    Taxol weekly 12 Herceptin and Perjeta every 3 weeks 6      09/05/2016 Breast MRI    Breast MRI: Complete Imaging response       CHIEF COMPLIANT: Follow-up to review the breast MRI  INTERVAL HISTORY: Jessica HOULTONis a 83year old with above-mentioned history of left breast cancer who finished neoadjuvant chemotherapy and is here to discuss the follow-up breast MRI report. We discussed her case at tumor board on 09/13/2016. She had a complete pathologic response. She had seen Dr. BNinfa Lindenwho is planning her surgery. She had done amazingly well from chemotherapy standpoint.  REVIEW OF SYSTEMS:   Constitutional: Denies fevers, chills or abnormal weight loss Eyes: Denies blurriness of vision Ears, nose, mouth, throat, and face: Denies mucositis or sore throat Respiratory: Denies cough, dyspnea or  wheezes Cardiovascular: Denies palpitation, chest discomfort Gastrointestinal:  Denies nausea, heartburn or change in bowel habits Skin: Denies abnormal skin rashes Lymphatics: Denies new lymphadenopathy or easy bruising Neurological:Denies numbness, tingling or new weaknesses Behavioral/Psych: Mood is stable, no new changes  Extremities: No lower extremity edema Breast:  denies any pain or lumps or nodules in either breasts All other systems were reviewed with the patient and are negative.  I have reviewed the past medical history, past surgical history, social history and family history with the patient and they are unchanged from previous note.  ALLERGIES:  is allergic to other.  MEDICATIONS:  Current Outpatient Prescriptions  Medication Sig Dispense Refill  . amLODipine (NORVASC) 5 MG tablet Take 1 tablet (5 mg total) by mouth daily. 180 tablet 3  . Calcium Carb-Cholecalciferol (CALCIUM 600+D3 PO) Take 1 tablet by mouth 2 (two) times daily.    . Cholecalciferol (VITAMIN D3) 1000 UNITS CAPS Take 1,000 Units by mouth daily.     .Marland Kitchenlevothyroxine (SYNTHROID, LEVOTHROID) 88 MCG tablet Take 1 tablet by mouth daily except on Wednesday take a 1/2 tablet. Yearly physical is due w/labs must see MD for refills 30 tablet 0  . meclizine (ANTIVERT) 25 MG tablet TAKE 1/2 TABLET BY MOUTH THREE TIMES DAILY AS NEEDED (Patient not taking: Reported on 07/27/2016) 15 tablet 2  . Multiple Vitamins-Minerals (CENTRUM SILVER PO) Take 1 tablet by mouth daily.     . Multiple Vitamins-Minerals (PRESERVISION/LUTEIN) CAPS Take 1 capsule by mouth 2 (two) times daily.     .Marland Kitchen  simvastatin (ZOCOR) 40 MG tablet TAKE 1 TABLET BY MOUTH DAILY 90 tablet 1   No current facility-administered medications for this visit.     PHYSICAL EXAMINATION: ECOG PERFORMANCE STATUS: 0 - Asymptomatic  Vitals:   09/18/16 1048  BP: (!) 125/56  Pulse: 64  Resp: 17  Temp: 97.7 F (36.5 C)   Filed Weights   09/18/16 1048  Weight:  120 lb (54.4 kg)    GENERAL:alert, no distress and comfortable SKIN: skin color, texture, turgor are normal, no rashes or significant lesions EYES: normal, Conjunctiva are pink and non-injected, sclera clear OROPHARYNX:no exudate, no erythema and lips, buccal mucosa, and tongue normal  NECK: supple, thyroid normal size, non-tender, without nodularity LYMPH:  no palpable lymphadenopathy in the cervical, axillary or inguinal LUNGS: clear to auscultation and percussion with normal breathing effort HEART: regular rate & rhythm and no murmurs and no lower extremity edema ABDOMEN:abdomen soft, non-tender and normal bowel sounds MUSCULOSKELETAL:no cyanosis of digits and no clubbing  NEURO: alert & oriented x 3 with fluent speech, no focal motor/sensory deficits EXTREMITIES: No lower extremity edema   LABORATORY DATA:  I have reviewed the data as listed   Chemistry      Component Value Date/Time   NA 139 08/29/2016 1010   K 4.2 08/29/2016 1010   CL 102 09/15/2014 1018   CO2 29 08/29/2016 1010   BUN 24.7 08/29/2016 1010   CREATININE 0.8 08/29/2016 1010      Component Value Date/Time   CALCIUM 9.8 08/29/2016 1010   ALKPHOS 67 08/29/2016 1010   AST 22 08/29/2016 1010   ALT 21 08/29/2016 1010   BILITOT 0.39 08/29/2016 1010       Lab Results  Component Value Date   WBC 6.9 08/29/2016   HGB 10.7 (L) 08/29/2016   HCT 32.8 (L) 08/29/2016   MCV 98.2 08/29/2016   PLT 232 08/29/2016   NEUTROABS 4.2 08/29/2016    ASSESSMENT & PLAN:  Breast cancer of upper-outer quadrant of left female breast ([place]) Left breast biopsy 04/12/2016: IDC grade 2-3, left axillary LN positive for metastatic IDC; mammogram revealed a 5 cm solid mass left breast 12:00 anterior depth. ER 0%, PR 0%, Ki-67 80%, HER-2 positive ratio 7.86, gene copy #16.5  Breast MRI: 05/16/2016:Left breast solid with cystic appearance OUQ 1:00 crossing into the medial left breast 4.7 x 3.3 x 4.2 cm; second 51m nodule left breast  UOQ: Posterior intramammary/low axillary lymph node, indeterminate left axillary lymph nodes, skin thickening Bone scan and CT chest abdomen pelvis 05/10/2016: No metastatic disease Clinical stage: T2 N1 M0 stage IIB  Treatment plan: 1. Neoadjuvant chemotherapy with Taxol Herceptin Perjeta. Taxol to be given weekly 12, Herceptin Perjeta to be given every 3 weeks 6 cycles 05/17/16 to 08/29/16 2. Mastectomy with sentinel lymph node biopsy/selective axillary node dissection 3. +/-adjuvant radiation ------------------------------------------------------------------------------------------------------------------------------------------------------- Breast MRI 09/05/16: Complete imaging response I reviewed these results with Dr. BNinfa Lindenon the tumor board on 09/13/2016. I reviewed the radiology images with the patient today.  Plan: 1. Mastectomy 2. +/- adj RT 3. Maintenance Herceptin-Perjeta We will set her up for Herceptin and Perjeta maintenance starting next Tuesday. She'll received every 3 weeks. I will be seeing her every other Herceptin and Perjeta with labs.  No orders of the defined types were placed in this encounter.  The patient has a good understanding of the overall plan. she agrees with it. she will call with any problems that may develop before the next visit here.  Rulon Eisenmenger, MD 09/18/16

## 2016-09-26 ENCOUNTER — Ambulatory Visit (HOSPITAL_BASED_OUTPATIENT_CLINIC_OR_DEPARTMENT_OTHER): Payer: PPO

## 2016-09-26 VITALS — BP 144/53 | HR 60 | Temp 97.7°F | Resp 16

## 2016-09-26 DIAGNOSIS — C50812 Malignant neoplasm of overlapping sites of left female breast: Secondary | ICD-10-CM | POA: Diagnosis not present

## 2016-09-26 DIAGNOSIS — Z5112 Encounter for antineoplastic immunotherapy: Secondary | ICD-10-CM | POA: Diagnosis not present

## 2016-09-26 DIAGNOSIS — C773 Secondary and unspecified malignant neoplasm of axilla and upper limb lymph nodes: Secondary | ICD-10-CM

## 2016-09-26 DIAGNOSIS — C50412 Malignant neoplasm of upper-outer quadrant of left female breast: Secondary | ICD-10-CM

## 2016-09-26 DIAGNOSIS — Z171 Estrogen receptor negative status [ER-]: Principal | ICD-10-CM

## 2016-09-26 MED ORDER — SODIUM CHLORIDE 0.9 % IV SOLN
Freq: Once | INTRAVENOUS | Status: AC
  Administered 2016-09-26: 12:00:00 via INTRAVENOUS

## 2016-09-26 MED ORDER — HEPARIN SOD (PORK) LOCK FLUSH 100 UNIT/ML IV SOLN
500.0000 [IU] | Freq: Once | INTRAVENOUS | Status: AC | PRN
  Administered 2016-09-26: 500 [IU]
  Filled 2016-09-26: qty 5

## 2016-09-26 MED ORDER — DIPHENHYDRAMINE HCL 50 MG/ML IJ SOLN
INTRAMUSCULAR | Status: AC
  Filled 2016-09-26: qty 1

## 2016-09-26 MED ORDER — ACETAMINOPHEN 325 MG PO TABS
ORAL_TABLET | ORAL | Status: AC
Start: 2016-09-26 — End: 2016-09-26
  Filled 2016-09-26: qty 2

## 2016-09-26 MED ORDER — TRASTUZUMAB CHEMO 150 MG IV SOLR
6.0000 mg/kg | Freq: Once | INTRAVENOUS | Status: AC
  Administered 2016-09-26: 315 mg via INTRAVENOUS
  Filled 2016-09-26: qty 15

## 2016-09-26 MED ORDER — ACETAMINOPHEN 325 MG PO TABS
650.0000 mg | ORAL_TABLET | Freq: Once | ORAL | Status: AC
  Administered 2016-09-26: 650 mg via ORAL

## 2016-09-26 MED ORDER — PERTUZUMAB CHEMO INJECTION 420 MG/14ML
420.0000 mg | Freq: Once | INTRAVENOUS | Status: AC
  Administered 2016-09-26: 420 mg via INTRAVENOUS
  Filled 2016-09-26: qty 14

## 2016-09-26 MED ORDER — DIPHENHYDRAMINE HCL 50 MG/ML IJ SOLN
25.0000 mg | Freq: Once | INTRAMUSCULAR | Status: AC
  Administered 2016-09-26: 25 mg via INTRAVENOUS

## 2016-09-26 MED ORDER — SODIUM CHLORIDE 0.9% FLUSH
10.0000 mL | INTRAVENOUS | Status: DC | PRN
  Administered 2016-09-26: 10 mL
  Filled 2016-09-26: qty 10

## 2016-09-26 NOTE — Patient Instructions (Signed)
[facility] Discharge Instructions for Patients Receiving Chemotherapy  Today you received the following chemotherapy agents:  Herceptin, Perjeta  To help prevent nausea and vomiting after your treatment, we encourage you to take your nausea medication as prescribed.   If you develop nausea and vomiting that is not controlled by your nausea medication, call the clinic.   BELOW ARE SYMPTOMS THAT SHOULD BE REPORTED IMMEDIATELY:  *FEVER GREATER THAN 100.5 F  *CHILLS WITH OR WITHOUT FEVER  NAUSEA AND VOMITING THAT IS NOT CONTROLLED WITH YOUR NAUSEA MEDICATION  *UNUSUAL SHORTNESS OF BREATH  *UNUSUAL BRUISING OR BLEEDING  TENDERNESS IN MOUTH AND THROAT WITH OR WITHOUT PRESENCE OF ULCERS  *URINARY PROBLEMS  *BOWEL PROBLEMS  UNUSUAL RASH Items with * indicate a potential emergency and should be followed up as soon as possible.  Feel free to call the clinic you have any questions or concerns. The clinic phone number is [phone].  Please show the CHEMO ALERT CARD at check-in to the Emergency Department and triage nurse.   

## 2016-09-26 NOTE — Progress Notes (Signed)
1335 - Patient declined 30 min observation post-Perjeta. No history of reaction. No physical complaints from patient.   Wylene Simmer, BSN, RN 09/26/2016 2:33 PM

## 2016-10-10 ENCOUNTER — Telehealth: Payer: Self-pay | Admitting: Hematology and Oncology

## 2016-10-10 NOTE — Telephone Encounter (Signed)
lvm to inform pt of 2/14 at 1130 am MD follow up per LOS

## 2016-10-12 ENCOUNTER — Telehealth: Payer: Self-pay | Admitting: Hematology and Oncology

## 2016-10-12 ENCOUNTER — Encounter (HOSPITAL_BASED_OUTPATIENT_CLINIC_OR_DEPARTMENT_OTHER): Payer: Self-pay | Admitting: *Deleted

## 2016-10-12 NOTE — Telephone Encounter (Signed)
called patient to advise that appointment time had changed for 10/25/16 from 11:30am to 3pm. Not able to leave a message so information was mailed to patient's address

## 2016-10-13 ENCOUNTER — Encounter (HOSPITAL_BASED_OUTPATIENT_CLINIC_OR_DEPARTMENT_OTHER)
Admission: RE | Admit: 2016-10-13 | Discharge: 2016-10-13 | Disposition: A | Discharge status: Home / Self Care | Payer: PPO | Source: Ambulatory Visit | Attending: Surgery | Admitting: Surgery

## 2016-10-13 ENCOUNTER — Other Ambulatory Visit: Payer: Self-pay

## 2016-10-13 DIAGNOSIS — Z01810 Encounter for preprocedural cardiovascular examination: Secondary | ICD-10-CM | POA: Diagnosis not present

## 2016-10-13 DIAGNOSIS — Z01818 Encounter for other preprocedural examination: Secondary | ICD-10-CM | POA: Diagnosis not present

## 2016-10-13 NOTE — Progress Notes (Addendum)
Pt instructed to drink Boost by 0630 day of surgery with teach back method. Pt taken to [facility] to see rooms since she will be spending the night. Ekg shown to Dr. [name] - ok for surgery

## 2016-10-17 ENCOUNTER — Other Ambulatory Visit: Payer: Self-pay | Admitting: Internal Medicine

## 2016-10-17 ENCOUNTER — Other Ambulatory Visit: Payer: Self-pay | Admitting: Emergency Medicine

## 2016-10-17 ENCOUNTER — Ambulatory Visit (HOSPITAL_BASED_OUTPATIENT_CLINIC_OR_DEPARTMENT_OTHER): Payer: PPO

## 2016-10-17 VITALS — BP 150/57 | HR 67 | Temp 98.9°F | Resp 16

## 2016-10-17 DIAGNOSIS — C773 Secondary and unspecified malignant neoplasm of axilla and upper limb lymph nodes: Secondary | ICD-10-CM

## 2016-10-17 DIAGNOSIS — C50412 Malignant neoplasm of upper-outer quadrant of left female breast: Secondary | ICD-10-CM

## 2016-10-17 DIAGNOSIS — Z171 Estrogen receptor negative status [ER-]: Principal | ICD-10-CM

## 2016-10-17 DIAGNOSIS — Z5112 Encounter for antineoplastic immunotherapy: Secondary | ICD-10-CM

## 2016-10-17 DIAGNOSIS — C50812 Malignant neoplasm of overlapping sites of left female breast: Secondary | ICD-10-CM

## 2016-10-17 LAB — COMPREHENSIVE METABOLIC PANEL
ALT: 23 U/L (ref 0–55)
AST: 24 U/L (ref 5–34)
Albumin: 3.7 g/dL (ref 3.5–5.0)
Alkaline Phosphatase: 70 U/L (ref 40–150)
Anion Gap: 7 mEq/L (ref 3–11)
BUN: 20.9 mg/dL (ref 7.0–26.0)
CO2: 29 meq/L (ref 22–29)
Calcium: 9.7 mg/dL (ref 8.4–10.4)
Chloride: 102 mEq/L (ref 98–109)
Creatinine: 0.8 mg/dL (ref 0.6–1.1)
EGFR: 68 mL/min/{1.73_m2} — AB (ref >90)
GLUCOSE: 124 mg/dL (ref 70–140)
POTASSIUM: 4.3 meq/L (ref 3.5–5.1)
SODIUM: 139 meq/L (ref 136–145)
Total Bilirubin: 0.32 mg/dL (ref 0.20–1.20)
Total Protein: 7 g/dL (ref 6.4–8.3)

## 2016-10-17 LAB — CBC WITH DIFFERENTIAL/PLATELET
BASO%: 0.6 % (ref 0.0–2.0)
Basophils Absolute: 0 10*3/uL (ref 0.0–0.1)
EOS ABS: 0.1 10*3/uL (ref 0.0–0.5)
EOS%: 1.8 % (ref 0.0–7.0)
HCT: 34.3 % — ABNORMAL LOW (ref 34.8–46.6)
HGB: 11.4 g/dL — ABNORMAL LOW (ref 11.6–15.9)
LYMPH%: 26.7 % (ref 14.0–49.7)
MCH: 32.4 pg (ref 25.1–34.0)
MCHC: 33.3 g/dL (ref 31.5–36.0)
MCV: 97.1 fL (ref 79.5–101.0)
MONO#: 0.8 10*3/uL (ref 0.1–0.9)
MONO%: 10.6 % (ref 0.0–14.0)
NEUT%: 60.3 % (ref 38.4–76.8)
NEUTROS ABS: 4.3 10*3/uL (ref 1.5–6.5)
Platelets: 213 10*3/uL (ref 145–400)
RBC: 3.53 10*6/uL — AB (ref 3.70–5.45)
RDW: 12.7 % (ref 11.2–14.5)
WBC: 7.1 10*3/uL (ref 3.9–10.3)
lymph#: 1.9 10*3/uL (ref 0.9–3.3)

## 2016-10-17 MED ORDER — SODIUM CHLORIDE 0.9 % IV SOLN
Freq: Once | INTRAVENOUS | Status: AC
  Administered 2016-10-17: 11:00:00 via INTRAVENOUS

## 2016-10-17 MED ORDER — ACETAMINOPHEN 325 MG PO TABS
ORAL_TABLET | ORAL | Status: AC
  Filled 2016-10-17: qty 2

## 2016-10-17 MED ORDER — SODIUM CHLORIDE 0.9% FLUSH
10.0000 mL | INTRAVENOUS | Status: DC | PRN
  Administered 2016-10-17: 10 mL
  Filled 2016-10-17: qty 10

## 2016-10-17 MED ORDER — PERTUZUMAB CHEMO INJECTION 420 MG/14ML
420.0000 mg | Freq: Once | INTRAVENOUS | Status: AC
  Administered 2016-10-17: 420 mg via INTRAVENOUS
  Filled 2016-10-17: qty 14

## 2016-10-17 MED ORDER — HEPARIN SOD (PORK) LOCK FLUSH 100 UNIT/ML IV SOLN
500.0000 [IU] | Freq: Once | INTRAVENOUS | Status: AC | PRN
  Administered 2016-10-17: 500 [IU]
  Filled 2016-10-17: qty 5

## 2016-10-17 MED ORDER — TRASTUZUMAB CHEMO 150 MG IV SOLR
6.0000 mg/kg | Freq: Once | INTRAVENOUS | Status: AC
  Administered 2016-10-17: 315 mg via INTRAVENOUS
  Filled 2016-10-17: qty 15

## 2016-10-17 MED ORDER — DIPHENHYDRAMINE HCL 50 MG/ML IJ SOLN
INTRAMUSCULAR | Status: AC
  Filled 2016-10-17: qty 1

## 2016-10-17 MED ORDER — ACETAMINOPHEN 325 MG PO TABS
650.0000 mg | ORAL_TABLET | Freq: Once | ORAL | Status: AC
Start: 2016-10-17 — End: 2016-10-17
  Administered 2016-10-17: 650 mg via ORAL

## 2016-10-17 MED ORDER — DIPHENHYDRAMINE HCL 50 MG/ML IJ SOLN
25.0000 mg | Freq: Once | INTRAMUSCULAR | Status: AC
Start: 2016-10-17 — End: 2016-10-17
  Administered 2016-10-17: 25 mg via INTRAVENOUS

## 2016-10-17 NOTE — H&P (Signed)
[name]  Location: [facility] Surgery Patient #: [id number] DOB: 12-01-1930 Widowed / Language: Cleophus Molt / Race: White Female   History of Present Illness  The patient is a 83 year old female who presents with breast cancer. This is a pleasant 83 year old female referred by Dr. Christene Slates after the recent diagnosis of a left breast cancer. She palpated a mass approximately a month ago. Mammograms were performed as well as ultrasound showing a large, 5 cm mass in the left breast. Biopsy of the mass and in a lymph node were performed both showing invasive cancer. I do not have the receptor status. She has been getting yearly mammograms which have been normal. She denies nipple discharge. She is otherwise healthy and without complaints.   Other Problems  Breast Cancer  General anesthesia - complications  Hypercholesterolemia  Lump In Breast  Thyroid Disease  Transfusion history   Past Surgical History  Breast Biopsy  Left. Cataract Surgery  Bilateral. Hysterectomy (not due to cancer) - Partial  Tonsillectomy   Diagnostic Studies History  Colonoscopy  1-5 years ago Mammogram  within last year Pap Smear  1-5 years ago  Allergies Anesthesia Extension Set *MEDICAL DEVICES AND SUPPLIES*   Medication History Meclizine HCl (25MG  Tablet, Oral as needed) Active. Simvastatin (40MG  Tablet, Oral) Active. Levothyroxine Sodium (88MCG Tablet, Oral) Active. Iron (325 (65 Fe)MG Tablet, Oral) Active. PreserVision AREDS 2 (Oral) Active. Vitamin D (1000UNIT Tablet, Oral) Active. Multiple Vitamin (Oral) Active. Calcium 600 (1500 (600 Ca)MG Tablet, Oral) Active. Centrum Silver Ultra Womens (Oral) Active. Medications Reconciled  Social History  Alcohol use  Occasional alcohol use. Caffeine use  Tea. No drug use  Tobacco use  Never smoker.  Family History  Bleeding disorder  Mother. Breast Cancer  Sister. Cerebrovascular Accident   Father. Depression  Mother.  Pregnancy / Birth History  Age at menarche  63 years. Age of menopause  <47 Gravida  4 Length (months) of breastfeeding  3-6 Maternal age  67-25 Para  4 Regular periods     Review of Systems General Not Present- Appetite Loss, Chills, Fatigue, Fever, Night Sweats, Weight Gain and Weight Loss. Skin Not Present- Change in Wart/Mole, Dryness, Hives, Jaundice, New Lesions, Non-Healing Wounds, Rash and Ulcer. HEENT Present- Hearing Loss and Wears glasses/contact lenses. Not Present- Earache, Hoarseness, Nose Bleed, Oral Ulcers, Ringing in the Ears, Seasonal Allergies, Sinus Pain, Sore Throat, Visual Disturbances and Yellow Eyes. Respiratory Not Present- Bloody sputum, Chronic Cough, Difficulty Breathing, Snoring and Wheezing. Breast Present- Breast Mass. Not Present- Breast Pain, Nipple Discharge and Skin Changes. Cardiovascular Not Present- Chest Pain, Difficulty Breathing Lying Down, Leg Cramps, Palpitations, Rapid Heart Rate, Shortness of Breath and Swelling of Extremities. Gastrointestinal Not Present- Abdominal Pain, Bloating, Bloody Stool, Change in Bowel Habits, Chronic diarrhea, Constipation, Difficulty Swallowing, Excessive gas, Gets full quickly at meals, Hemorrhoids, Indigestion, Nausea, Rectal Pain and Vomiting. Female Genitourinary Present- Frequency and Urgency. Not Present- Nocturia, Painful Urination and Pelvic Pain. Musculoskeletal Not Present- Back Pain, Joint Pain, Joint Stiffness, Muscle Pain, Muscle Weakness and Swelling of Extremities. Neurological Not Present- Decreased Memory, Fainting, Headaches, Numbness, Seizures, Tingling, Tremor, Trouble walking and Weakness. Endocrine Not Present- Cold Intolerance, Excessive Hunger, Hair Changes, Heat Intolerance, Hot flashes and New Diabetes. Hematology Not Present- Blood Thinners, Easy Bruising, Excessive bleeding, Gland problems, HIV and Persistent Infections.  Vitals   Weight: 116 lb  Height: 62in Body Surface Area: 1.52 m Body Mass Index: 21.22 kg/m  Temp.: 98.38F  Pulse: 97 (Regular)  BP: 140/82 (  Sitting, Left Arm, Standard)    Physical Exam  General Mental Status-Alert. General Appearance-Consistent with stated age. Hydration-Well hydrated. Voice-Normal.  Head and Neck Head-normocephalic, atraumatic with no lesions or palpable masses. Trachea-midline. Thyroid Gland Characteristics - normal size and consistency.  Eye Eyeball - Bilateral-Extraocular movements intact. Sclera/Conjunctiva - Bilateral-No scleral icterus.  Chest and Lung Exam Chest and lung exam reveals -quiet, even and easy respiratory effort with no use of accessory muscles and on auscultation, normal breath sounds, no adventitious sounds and normal vocal resonance. Inspection Chest Wall - Normal. Back - normal.  Breast Breast - Left-Biopsy scar and Bulging, Non Tender, No Dimpling, No Inflammation, No Lumpectomy scars, No Mastectomy scars, No Peau d' Orange. Note: There is a large firm mass in the upper-outer quadrant of the left breast. I cannot feel the enlarged node in the axilla but there is a lot of ecchymosis in the breast and axilla from the biopsy. The mass is mobile. There are no other breast masses Breast - Right-Symmetric, Non Tender, No Biopsy scars, no Dimpling, No Inflammation, No Lumpectomy scars, No Mastectomy scars, No Peau d' Orange. Breast Lump-No Palpable Breast Mass.  Cardiovascular Cardiovascular examination reveals -normal heart sounds, regular rate and rhythm with no murmurs and normal pedal pulses bilaterally.  Abdomen Inspection Inspection of the abdomen reveals - No Hernias. Skin - Scar - no surgical scars. Palpation/Percussion Palpation and Percussion of the abdomen reveal - Soft, Non Tender, No Rebound tenderness, No Rigidity (guarding) and No hepatosplenomegaly. Auscultation Auscultation of the abdomen reveals - Bowel  sounds normal.  Neurologic Neurologic evaluation reveals -alert and oriented x 3 with no impairment of recent or remote memory. Mental Status-Normal.  Musculoskeletal Normal Exam - Left-Upper Extremity Strength Normal and Lower Extremity Strength Normal. Normal Exam - Right-Upper Extremity Strength Normal and Lower Extremity Strength Normal.  Lymphatic Head & Neck  General Head & Neck Lymphatics: Bilateral - Description - Normal. Axillary  General Axillary Region: Bilateral - Description - Normal. Tenderness - Non Tender. Femoral & Inguinal  Generalized Femoral & Inguinal Lymphatics: Bilateral - Description - Normal. Tenderness - Non Tender.    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 It is worrisome that this may be a fast growing malignancy. She has already basically decided that she was to proceed with a mastectomy. I will go ahead and refer her to medical oncology preoperatively. Receptor status again is pending. I will discuss with oncology whether they wanted to proceed with an axillary dissection as well as the mastectomy. I discussed the risk of surgery which includes but is not limited to bleeding, infection, injury to surrounding structures, seroma formation, drain placement, cardiopulmonary issues, etc. She and her family understand and wish to proceed as soon as possible after she gets back from the beach  ADDENDUM:  The patient is a 83 year old female who presents with breast cancer. She is here for a follow-up regarding her left breast cancer. She is completing chemotherapy and has had a complete radiographic response based on her last MRI. There is no more evidence of malignancy and the lymph nodes in axilla or smaller. The multidisciplinary breast cancer conference has recommended that she undergo a left mastectomy and axillary dissection. She  currently is doing very well and is handled her treatment well and has no complaints.  She has had an excellent response to chemotherapy. It is  still been recommended that she undergo a left mastectomy with axillary dissection. I will do a limited axillary dissection. I discussed this with the patient her daughter in detail. I discussed the risk of surgery in detail. She agrees to proceed with mastectomy which will be scheduled

## 2016-10-17 NOTE — Patient Instructions (Signed)
[facility] Discharge Instructions for Patients Receiving Chemotherapy  Today you received the following chemotherapy agents Herceptin and Perjeta   To help prevent nausea and vomiting after your treatment, we encourage you to take your nausea medication as directed.    If you develop nausea and vomiting that is not controlled by your nausea medication, call the clinic.   BELOW ARE SYMPTOMS THAT SHOULD BE REPORTED IMMEDIATELY:  *FEVER GREATER THAN 100.5 F  *CHILLS WITH OR WITHOUT FEVER  NAUSEA AND VOMITING THAT IS NOT CONTROLLED WITH YOUR NAUSEA MEDICATION  *UNUSUAL SHORTNESS OF BREATH  *UNUSUAL BRUISING OR BLEEDING  TENDERNESS IN MOUTH AND THROAT WITH OR WITHOUT PRESENCE OF ULCERS  *URINARY PROBLEMS  *BOWEL PROBLEMS  UNUSUAL RASH Items with * indicate a potential emergency and should be followed up as soon as possible.  Feel free to call the clinic you have any questions or concerns. The clinic phone number is [phone].  Please show the CHEMO ALERT CARD at check-in to the Emergency Department and triage nurse.   

## 2016-10-18 ENCOUNTER — Encounter (HOSPITAL_BASED_OUTPATIENT_CLINIC_OR_DEPARTMENT_OTHER)
Admission: RE | Disposition: A | Discharge status: Home / Self Care | Payer: Self-pay | Source: Ambulatory Visit | Attending: Surgery

## 2016-10-18 ENCOUNTER — Ambulatory Visit (HOSPITAL_BASED_OUTPATIENT_CLINIC_OR_DEPARTMENT_OTHER)
Admission: RE | Admit: 2016-10-18 | Discharge: 2016-10-19 | Disposition: A | Discharge status: Home / Self Care | Payer: PPO | Source: Ambulatory Visit | Attending: Surgery | Admitting: Surgery

## 2016-10-18 ENCOUNTER — Ambulatory Visit (HOSPITAL_BASED_OUTPATIENT_CLINIC_OR_DEPARTMENT_OTHER): Payer: PPO | Admitting: Anesthesiology

## 2016-10-18 ENCOUNTER — Encounter (HOSPITAL_BASED_OUTPATIENT_CLINIC_OR_DEPARTMENT_OTHER): Payer: Self-pay | Admitting: Anesthesiology

## 2016-10-18 DIAGNOSIS — E039 Hypothyroidism, unspecified: Secondary | ICD-10-CM | POA: Diagnosis not present

## 2016-10-18 DIAGNOSIS — Z90710 Acquired absence of both cervix and uterus: Secondary | ICD-10-CM | POA: Insufficient documentation

## 2016-10-18 DIAGNOSIS — C50912 Malignant neoplasm of unspecified site of left female breast: Secondary | ICD-10-CM | POA: Diagnosis not present

## 2016-10-18 DIAGNOSIS — Z9012 Acquired absence of left breast and nipple: Secondary | ICD-10-CM

## 2016-10-18 DIAGNOSIS — C50412 Malignant neoplasm of upper-outer quadrant of left female breast: Secondary | ICD-10-CM | POA: Diagnosis not present

## 2016-10-18 DIAGNOSIS — N6012 Diffuse cystic mastopathy of left breast: Secondary | ICD-10-CM | POA: Diagnosis not present

## 2016-10-18 DIAGNOSIS — G8918 Other acute postprocedural pain: Secondary | ICD-10-CM | POA: Diagnosis not present

## 2016-10-18 DIAGNOSIS — Z853 Personal history of malignant neoplasm of breast: Secondary | ICD-10-CM | POA: Diagnosis present

## 2016-10-18 DIAGNOSIS — E7800 Pure hypercholesterolemia, unspecified: Secondary | ICD-10-CM | POA: Diagnosis not present

## 2016-10-18 DIAGNOSIS — Z9221 Personal history of antineoplastic chemotherapy: Secondary | ICD-10-CM | POA: Insufficient documentation

## 2016-10-18 DIAGNOSIS — C50919 Malignant neoplasm of unspecified site of unspecified female breast: Secondary | ICD-10-CM | POA: Diagnosis present

## 2016-10-18 HISTORY — PX: MODIFIED MASTECTOMY: SHX5268

## 2016-10-18 HISTORY — DX: Other specified postprocedural states: Z98.890

## 2016-10-18 HISTORY — DX: Nausea with vomiting, unspecified: R11.2

## 2016-10-18 LAB — POCT HEMOGLOBIN-HEMACUE: Hemoglobin: 12.6 g/dL (ref 12.0–15.0)

## 2016-10-18 SURGERY — MODIFIED MASTECTOMY
Anesthesia: General | Site: Breast | Laterality: Left

## 2016-10-18 MED ORDER — CHLORHEXIDINE GLUCONATE CLOTH 2 % EX PADS: 6.0000 | MEDICATED_PAD | Freq: Once | CUTANEOUS | Status: DC

## 2016-10-18 MED ORDER — BUPIVACAINE-EPINEPHRINE (PF) 0.5% -1:200000 IJ SOLN
INTRAMUSCULAR | Status: DC | PRN
  Administered 2016-10-18: 25 mL

## 2016-10-18 MED ORDER — LEVOTHYROXINE SODIUM 88 MCG PO TABS
88.0000 ug | ORAL_TABLET | Freq: Every day | ORAL | Status: DC
  Administered 2016-10-19: 88 ug via ORAL

## 2016-10-18 MED ORDER — DEXAMETHASONE SODIUM PHOSPHATE 10 MG/ML IJ SOLN
INTRAMUSCULAR | Status: AC
  Filled 2016-10-18: qty 1

## 2016-10-18 MED ORDER — ENOXAPARIN SODIUM 40 MG/0.4ML ~~LOC~~ SOLN: 40.0000 mg | SUBCUTANEOUS | Status: DC

## 2016-10-18 MED ORDER — METHOCARBAMOL 500 MG PO TABS
500.0000 mg | ORAL_TABLET | Freq: Four times a day (QID) | ORAL | Status: DC | PRN
  Administered 2016-10-18: 500 mg via ORAL
  Filled 2016-10-18: qty 1

## 2016-10-18 MED ORDER — MORPHINE SULFATE (PF) 2 MG/ML IV SOLN: 1.0000 mg | INTRAVENOUS | Status: DC | PRN

## 2016-10-18 MED ORDER — LIDOCAINE HCL (CARDIAC) 20 MG/ML IV SOLN
INTRAVENOUS | Status: DC | PRN
  Administered 2016-10-18: 30 mg via INTRAVENOUS

## 2016-10-18 MED ORDER — MIDAZOLAM HCL 2 MG/2ML IJ SOLN: 1.0000 mg | INTRAMUSCULAR | Status: DC | PRN

## 2016-10-18 MED ORDER — ACETAMINOPHEN 325 MG PO TABS: 650.0000 mg | ORAL_TABLET | Freq: Four times a day (QID) | ORAL | Status: DC | PRN

## 2016-10-18 MED ORDER — GLYCOPYRROLATE 0.2 MG/ML IJ SOLN
INTRAMUSCULAR | Status: DC | PRN
  Administered 2016-10-18: 0.1 mg via INTRAVENOUS

## 2016-10-18 MED ORDER — PROPOFOL 10 MG/ML IV BOLUS
INTRAVENOUS | Status: DC | PRN
  Administered 2016-10-18 (×3): 50 mg via INTRAVENOUS
  Administered 2016-10-18: 150 mg via INTRAVENOUS

## 2016-10-18 MED ORDER — ONDANSETRON HCL 4 MG/2ML IJ SOLN
INTRAMUSCULAR | Status: AC
  Filled 2016-10-18: qty 2

## 2016-10-18 MED ORDER — BUPIVACAINE-EPINEPHRINE (PF) 0.5% -1:200000 IJ SOLN
INTRAMUSCULAR | Status: AC
  Filled 2016-10-18: qty 30

## 2016-10-18 MED ORDER — SCOPOLAMINE 1 MG/3DAYS TD PT72: 1.0000 | MEDICATED_PATCH | Freq: Once | TRANSDERMAL | Status: DC | PRN

## 2016-10-18 MED ORDER — FENTANYL CITRATE (PF) 100 MCG/2ML IJ SOLN
25.0000 ug | INTRAMUSCULAR | Status: DC | PRN
  Administered 2016-10-18: 50 ug via INTRAVENOUS

## 2016-10-18 MED ORDER — AMLODIPINE BESYLATE 5 MG PO TABS
5.0000 mg | ORAL_TABLET | Freq: Every day | ORAL | Status: DC
  Administered 2016-10-18: 5 mg via ORAL

## 2016-10-18 MED ORDER — SODIUM CHLORIDE 0.9 % IV SOLN
INTRAVENOUS | Status: DC
  Administered 2016-10-18: 13:00:00 via INTRAVENOUS

## 2016-10-18 MED ORDER — CEFAZOLIN SODIUM-DEXTROSE 2-4 GM/100ML-% IV SOLN
2.0000 g | INTRAVENOUS | Status: AC
  Administered 2016-10-18: 2 g via INTRAVENOUS

## 2016-10-18 MED ORDER — FENTANYL CITRATE (PF) 100 MCG/2ML IJ SOLN
50.0000 ug | INTRAMUSCULAR | Status: DC | PRN
  Administered 2016-10-18: 25 ug via INTRAVENOUS

## 2016-10-18 MED ORDER — PROPOFOL 10 MG/ML IV BOLUS
INTRAVENOUS | Status: AC
  Filled 2016-10-18: qty 20

## 2016-10-18 MED ORDER — OXYCODONE HCL 5 MG PO TABS
5.0000 mg | ORAL_TABLET | ORAL | Status: DC | PRN
  Administered 2016-10-18: 10 mg via ORAL
  Filled 2016-10-18: qty 2

## 2016-10-18 MED ORDER — ONDANSETRON HCL 4 MG/2ML IJ SOLN: 4.0000 mg | Freq: Four times a day (QID) | INTRAMUSCULAR | Status: DC | PRN

## 2016-10-18 MED ORDER — FENTANYL CITRATE (PF) 100 MCG/2ML IJ SOLN
INTRAMUSCULAR | Status: AC
  Filled 2016-10-18: qty 2

## 2016-10-18 MED ORDER — CEFAZOLIN SODIUM-DEXTROSE 2-4 GM/100ML-% IV SOLN
INTRAVENOUS | Status: AC
  Filled 2016-10-18: qty 100

## 2016-10-18 MED ORDER — ONDANSETRON 4 MG PO TBDP: 4.0000 mg | ORAL_TABLET | Freq: Four times a day (QID) | ORAL | Status: DC | PRN

## 2016-10-18 MED ORDER — DEXAMETHASONE SODIUM PHOSPHATE 4 MG/ML IJ SOLN
INTRAMUSCULAR | Status: DC | PRN
  Administered 2016-10-18: 10 mg via INTRAVENOUS

## 2016-10-18 MED ORDER — LACTATED RINGERS IV SOLN
INTRAVENOUS | Status: DC
  Administered 2016-10-18 (×2): via INTRAVENOUS

## 2016-10-18 MED ORDER — ONDANSETRON HCL 4 MG/2ML IJ SOLN
INTRAMUSCULAR | Status: DC | PRN
  Administered 2016-10-18 (×2): 4 mg via INTRAVENOUS

## 2016-10-18 MED ORDER — LIDOCAINE 2% (20 MG/ML) 5 ML SYRINGE
INTRAMUSCULAR | Status: AC
  Filled 2016-10-18: qty 5

## 2016-10-18 MED ORDER — ACETAMINOPHEN 650 MG RE SUPP: 650.0000 mg | Freq: Four times a day (QID) | RECTAL | Status: DC | PRN

## 2016-10-18 SURGICAL SUPPLY — 48 items
APPLIER CLIP 9.375 MED OPEN (MISCELLANEOUS) ×3
BINDER BREAST MEDIUM (GAUZE/BANDAGES/DRESSINGS) ×3 IMPLANT
BLADE HEX COATED 2.75 (ELECTRODE) ×3 IMPLANT
BLADE SURG 15 STRL LF DISP TIS (BLADE) ×2 IMPLANT
BLADE SURG 15 STRL SS (BLADE) ×4
CANISTER SUCT 1200ML W/VALVE (MISCELLANEOUS) ×3 IMPLANT
CHLORAPREP W/TINT 26ML (MISCELLANEOUS) ×3 IMPLANT
CLIP APPLIE 9.375 MED OPEN (MISCELLANEOUS) ×1 IMPLANT
COVER BACK TABLE 60X90IN (DRAPES) ×3 IMPLANT
COVER MAYO STAND STRL (DRAPES) ×3 IMPLANT
DERMABOND ADVANCED (GAUZE/BANDAGES/DRESSINGS) ×4
DERMABOND ADVANCED .7 DNX12 (GAUZE/BANDAGES/DRESSINGS) ×2 IMPLANT
DRAIN CHANNEL 19F RND (DRAIN) ×3 IMPLANT
DRAPE LAPAROSCOPIC ABDOMINAL (DRAPES) ×3 IMPLANT
DRAPE UTILITY XL STRL (DRAPES) ×3 IMPLANT
DRSG PAD ABDOMINAL 8X10 ST (GAUZE/BANDAGES/DRESSINGS) ×3 IMPLANT
ELECT REM PT RETURN 9FT ADLT (ELECTROSURGICAL) ×3
ELECTRODE REM PT RTRN 9FT ADLT (ELECTROSURGICAL) ×1 IMPLANT
EVACUATOR SILICONE 100CC (DRAIN) ×3 IMPLANT
GAUZE SPONGE 4X4 12PLY STRL (GAUZE/BANDAGES/DRESSINGS) ×3 IMPLANT
GLOVE BIO SURGEON STRL SZ 6.5 (GLOVE) ×2 IMPLANT
GLOVE BIO SURGEONS STRL SZ 6.5 (GLOVE) ×1
GLOVE BIOGEL PI IND STRL 7.0 (GLOVE) ×1 IMPLANT
GLOVE BIOGEL PI INDICATOR 7.0 (GLOVE) ×2
GLOVE SURG SIGNA 7.5 PF LTX (GLOVE) ×3 IMPLANT
GOWN STRL REUS W/ TWL LRG LVL3 (GOWN DISPOSABLE) ×1 IMPLANT
GOWN STRL REUS W/ TWL XL LVL3 (GOWN DISPOSABLE) ×1 IMPLANT
GOWN STRL REUS W/TWL LRG LVL3 (GOWN DISPOSABLE) ×2
GOWN STRL REUS W/TWL XL LVL3 (GOWN DISPOSABLE) ×2
HEMOSTAT SURGICEL 2X14 (HEMOSTASIS) IMPLANT
NS IRRIG 1000ML POUR BTL (IV SOLUTION) ×3 IMPLANT
PACK BASIN DAY SURGERY FS (CUSTOM PROCEDURE TRAY) ×3 IMPLANT
PENCIL BUTTON HOLSTER BLD 10FT (ELECTRODE) ×3 IMPLANT
PIN SAFETY STERILE (MISCELLANEOUS) ×6 IMPLANT
SLEEVE SCD COMPRESS KNEE MED (MISCELLANEOUS) ×3 IMPLANT
SPONGE LAP 18X18 X RAY DECT (DISPOSABLE) ×3 IMPLANT
SUT ETHILON 2 0 FS 18 (SUTURE) ×3 IMPLANT
SUT ETHILON 3 0 PS 1 (SUTURE) ×3 IMPLANT
SUT MNCRL AB 4-0 PS2 18 (SUTURE) ×6 IMPLANT
SUT SILK 2 0 SH (SUTURE) ×3 IMPLANT
SUT VIC AB 3-0 SH 27 (SUTURE) ×4
SUT VIC AB 3-0 SH 27X BRD (SUTURE) ×2 IMPLANT
SYR BULB 3OZ (MISCELLANEOUS) ×3 IMPLANT
TOWEL OR 17X24 6PK STRL BLUE (TOWEL DISPOSABLE) ×3 IMPLANT
TOWEL OR NON WOVEN STRL DISP B (DISPOSABLE) ×3 IMPLANT
TUBE CONNECTING 20'X1/4 (TUBING) ×1
TUBE CONNECTING 20X1/4 (TUBING) ×2 IMPLANT
YANKAUER SUCT BULB TIP NO VENT (SUCTIONS) ×3 IMPLANT

## 2016-10-18 NOTE — Anesthesia Postprocedure Evaluation (Signed)
Anesthesia Post Note  Patient: Jessica Sherman  Procedure(s) Performed: Procedure(s) (LRB): LEFT MODIFIED RADICAL MASTECTOMY (Left)  Patient location during evaluation: PACU Anesthesia Type: General Level of consciousness: awake and alert Pain management: pain level controlled Vital Signs Assessment: post-procedure vital signs reviewed and stable Respiratory status: spontaneous breathing, nonlabored ventilation, respiratory function stable and patient connected to nasal cannula oxygen Cardiovascular status: blood pressure returned to baseline and stable Postop Assessment: no signs of nausea or vomiting Anesthetic complications: no       Last Vitals:  Vitals:   10/18/16 1200 10/18/16 1215  BP: (!) 162/68 (!) 172/77  Pulse: 65 67  Resp: 16 18  Temp:  36.2 C    Last Pain:  Vitals:   10/18/16 1245  TempSrc:   PainSc: 3                  Tiajuana Amass

## 2016-10-18 NOTE — Op Note (Signed)
LEFT MODIFIED RADICAL MASTECTOMY  Procedure Note  Jessica Sherman 10/18/2016   Pre-op Diagnosis: LEFT BREAST CANCER     Post-op Diagnosis: same  Procedure(s): LEFT MODIFIED RADICAL MASTECTOMY  Surgeon(s): [name], MD  Anesthesia: General  Staff:  Circulator: Izora Ribas, RN Scrub Person: Lorenza Burton, CST  Estimated Blood Loss: 150 cc               Specimens: sent to path  Indications: This is an 83 year old female who presented last year with a large left breast cancer. She was node positive. She underwent neoadjuvant therapy and now has had a complete radiographic response on her most recent MRI. Because of the extent of her disease, the decision was made to proceed with a modified radical mastectomy on the left.  Procedure: The patient was brought to the operating room and identified as the correct patient. She was placed supine on the operating room table and general anesthesia was induced. Her left breast and axilla were then prepped and draped in the usual sterile fashion. I made an elliptical incision going medial to lateral on the left breast incorporating the nipple areolar complex. I then took this down into the subjacent tissue with electrocautery. With the aid of the skin hooks I then created the superior skin flap staying underneath the skin with the electrocautery. The patient had a lot of neovascularization. I took this down to the level of the chest wall with electrocautery. I then dissected from medial to lateral going toward the axilla with the electrocautery. Next I turned my attention to the lower skin flap. Again we used the skin hooks to elevate the flap and I dissected the underlying breast tissue off of the overlying skin and subjacent tissue. I then took this down to the inframammary ridge. I moved medial to lateral going toward the axilla was well. I then dissected the breast off of the pectoralis fashion with the electrocautery moving medial to  lateral. As I was getting toward the axilla identified the edge of the pectoralis major and minor muscles and dissected the breast tissue free from this. There was minimal enlarged lymph nodes in the axilla. As I was trying to dissect the overlying skin off of the axillary tissue I created a small buttonhole in the skin which I repaired with silk with Vicryl sutures. I then got into the axilla with the dissection and dissected out the deep axillary lymph nodes. Identified the thoracodorsal and long thoracic nerves and spare them. Several bridging veins and lymphatics were clipped with surgical clips. I then completely dissected free the nodal package from the axilla with the aid of electrocautery. Once the mastectomy and axillary contents were completely removed, I marked the skin laterally the skin with a silk suture. The specimen was then sent to pathology for evaluation. I then thoroughly irrigated the wound with normal saline. Hemostasis appeared to be achieved with the cautery. I made a separate skin incision and placed a 19 [place] Blake drain into the axilla and underneath the mastectomy flaps. I said this and placed a 2-0 nylon suture. I then closed the skin incisions with interrupted 3-0 Vicryl sutures and 4-0 running Monocryl sutures. Dermabond was then applied. Gauze was then placed over this and the patient was placed in a breast binder. The drain was placed to bulb suction. The patient tolerated procedure well. All the counts were correct at the end procedure. The patient was then activated and the operating room and taken in  a stable condition to the recovery room.           Jessica Sherman A   Date: 10/18/2016  Time: 11:22 AM

## 2016-10-18 NOTE — Progress Notes (Signed)
Assisted Dr. Rob Fitzgerald with left, ultrasound guided, pectoralis block. Side rails up, monitors on throughout procedure. See vital signs in flow sheet. Tolerated Procedure well. 

## 2016-10-18 NOTE — Transfer of Care (Signed)
Immediate Anesthesia Transfer of Care Note  Patient: Jessica Sherman  Procedure(s) Performed: Procedure(s): LEFT MODIFIED RADICAL MASTECTOMY (Left)  Patient Location: PACU  Anesthesia Type:GA combined with regional for post-op pain  Level of Consciousness: awake and patient cooperative  Airway & Oxygen Therapy: Patient Spontanous Breathing and Patient connected to face mask oxygen  Post-op Assessment: Report given to RN and Post -op Vital signs reviewed and stable  Post vital signs: Reviewed and stable  Last Vitals:  Vitals:   10/18/16 0930 10/18/16 0935  BP: (!) 187/45 (!) 162/56  Pulse: 82 77  Resp: 17 17  Temp:      Last Pain:  Vitals:   10/18/16 0849  TempSrc: Oral         Complications: No apparent anesthesia complications

## 2016-10-18 NOTE — Anesthesia Procedure Notes (Signed)
Procedure Name: LMA Insertion Date/Time: 10/18/2016 10:11 AM Performed by: Toula Moos L Pre-anesthesia Checklist: Patient identified, Emergency Drugs available, Suction available, Patient being monitored and Timeout performed Patient Re-evaluated:Patient Re-evaluated prior to inductionOxygen Delivery Method: Circle system utilized Preoxygenation: Pre-oxygenation with 100% oxygen Intubation Type: IV induction Ventilation: Mask ventilation without difficulty LMA: LMA inserted LMA Size: 4.0 Number of attempts: 1 Airway Equipment and Method: Bite block Placement Confirmation: positive ETCO2 Tube secured with: Tape Dental Injury: Teeth and Oropharynx as per pre-operative assessment

## 2016-10-18 NOTE — Anesthesia Preprocedure Evaluation (Signed)
Anesthesia Evaluation  Patient identified by MRN, date of birth, ID band Patient awake    Reviewed: Allergy & Precautions, NPO status , Patient's Chart, lab work & pertinent test results  History of Anesthesia Complications (+) PONV  Airway Mallampati: II  TM Distance: >3 FB Neck ROM: Full    Dental  (+) Dental Advisory Given   Pulmonary neg pulmonary ROS,    breath sounds clear to auscultation       Cardiovascular negative cardio ROS   Rhythm:Regular Rate:Normal     Neuro/Psych Vertigo    GI/Hepatic negative GI ROS, Neg liver ROS,   Endo/Other  Hypothyroidism   Renal/GU negative Renal ROS     Musculoskeletal negative musculoskeletal ROS (+)   Abdominal   Peds  Hematology negative hematology ROS (+)   Anesthesia Other Findings   Reproductive/Obstetrics                             Lab Results  Component Value Date   WBC 7.1 10/17/2016   HGB 12.6 10/18/2016   HCT 34.3 (L) 10/17/2016   MCV 97.1 10/17/2016   PLT 213 10/17/2016   Lab Results  Component Value Date   CREATININE 0.8 10/17/2016   BUN 20.9 10/17/2016   NA 139 10/17/2016   K 4.3 10/17/2016   CL 102 09/15/2014   CO2 29 10/17/2016    Anesthesia Physical Anesthesia Plan  ASA: II  Anesthesia Plan: General   Post-op Pain Management:  Regional for Post-op pain   Induction: Intravenous  Airway Management Planned: LMA  Additional Equipment:   Intra-op Plan:   Post-operative Plan: Extubation in OR  Informed Consent: I have reviewed the patients History and Physical, chart, labs and discussed the procedure including the risks, benefits and alternatives for the proposed anesthesia with the patient or authorized representative who has indicated his/her understanding and acceptance.   Dental advisory given  Plan Discussed with: CRNA  Anesthesia Plan Comments:         Anesthesia Quick Evaluation

## 2016-10-18 NOTE — Anesthesia Procedure Notes (Signed)
Anesthesia Regional Block:  Pectoralis block  Pre-Anesthetic Checklist: ,, timeout performed, Correct Patient, Correct Site, Correct Laterality, Correct Procedure, Correct Position, site marked, Risks and benefits discussed,  Surgical consent,  Pre-op evaluation,  At surgeon's request and post-op pain management  Laterality: Left  Prep: chloraprep       Needles:  Injection technique: Single-shot  Needle Type: Echogenic Needle     Needle Length: 9cm 9 cm Needle Gauge: 21 and 21 G    Additional Needles:  Procedures: ultrasound guided (picture in chart) Pectoralis block Narrative:  Start time: 10/18/2016 9:27 AM End time: 10/18/2016 9:34 AM Injection made incrementally with aspirations every 5 mL.  Performed by: Personally  Anesthesiologist: Suzette Battiest

## 2016-10-18 NOTE — Interval H&P Note (Signed)
History and Physical Interval Note: no change in H and P  10/18/2016 9:54 AM  Jessica Sherman  has presented today for surgery, with the diagnosis of LEFT BREAST CANCER  The various methods of treatment have been discussed with the patient and family. After consideration of risks, benefits and other options for treatment, the patient has consented to  Procedure(s): LEFT MODIFIED RADICAL MASTECTOMY (Left) as a surgical intervention .  The patient's history has been reviewed, patient examined, no change in status, stable for surgery.  I have reviewed the patient's chart and labs.  Questions were answered to the patient's satisfaction.     Syd Newsome A

## 2016-10-19 ENCOUNTER — Encounter (HOSPITAL_BASED_OUTPATIENT_CLINIC_OR_DEPARTMENT_OTHER): Payer: Self-pay | Admitting: Surgery

## 2016-10-19 DIAGNOSIS — C50912 Malignant neoplasm of unspecified site of left female breast: Secondary | ICD-10-CM | POA: Diagnosis not present

## 2016-10-19 MED ORDER — TRAMADOL HCL 50 MG PO TABS: 50.0000 mg | ORAL_TABLET | Freq: Four times a day (QID) | ORAL | 0 refills | Status: DC | PRN

## 2016-10-19 NOTE — Discharge Instructions (Signed)
About my Jackson-Pratt Bulb Drain  What is a Jackson-Pratt bulb? A Jackson-Pratt is a soft, round device used to collect drainage. It is connected to a long, thin drainage catheter, which is held in place by one or two small stiches near your surgical incision site. When the bulb is squeezed, it forms a vacuum, forcing the drainage to empty into the bulb.  Emptying the Jackson-Pratt bulb- To empty the bulb: 1. Release the plug on the top of the bulb. 2. Pour the bulb's contents into a measuring container which your nurse will provide. 3. Record the time emptied and amount of drainage. Empty the drain(s) as often as your     doctor or nurse recommends.  Date                  Time                    Amount (Drain 1)                 Amount (Drain 2)  _____________________________________________________________________  _____________________________________________________________________  _____________________________________________________________________  _____________________________________________________________________  _____________________________________________________________________  _____________________________________________________________________  _____________________________________________________________________  _____________________________________________________________________  Squeezing the Jackson-Pratt Bulb- To squeeze the bulb: 1. Make sure the plug at the top of the bulb is open. 2. Squeeze the bulb tightly in your fist. You will hear air squeezing from the bulb. 3. Replace the plug while the bulb is squeezed. 4. Use a safety pin to attach the bulb to your clothing. This will keep the catheter from     pulling at the bulb insertion site.  When to call your doctor- Call your doctor if:  Drain site becomes red, swollen or hot.  You have a fever greater than 101 degrees F.  There is oozing at the drain site.  Drain falls out (apply a guaze  bandage over the drain hole and secure it with tape).  Drainage increases daily not related to activity patterns. (You will usually have more drainage when you are active than when you are resting.)  Drainage has a bad odor.    [facility] [phone]  MASTECTOMY: POST OP INSTRUCTIONS  Always review your discharge instruction sheet given to you by the facility where your surgery was performed. IF YOU HAVE DISABILITY OR FAMILY LEAVE FORMS, YOU MUST BRING THEM TO THE OFFICE FOR PROCESSING.   DO NOT GIVE THEM TO YOUR DOCTOR. A prescription for pain medication may be given to you upon discharge.  Take your pain medication as prescribed, if needed.  If narcotic pain medicine is not needed, then you may take acetaminophen (Tylenol) or ibuprofen (Advil) as needed. 1. Take your usually prescribed medications unless otherwise directed. 2. If you need a refill on your pain medication, please contact your pharmacy.  They will contact our office to request authorization.  Prescriptions will not be filled after 5pm or on week-ends. 3. You should follow a light diet the first few days after arrival home, such as soup and crackers, etc.  Resume your normal diet the day after surgery. 4. Most patients will experience some swelling and bruising on the chest and underarm.  Ice packs will help.  Swelling and bruising can take several days to resolve.  5. It is common to experience some constipation if taking pain medication after surgery.  Increasing fluid intake and taking a stool softener (such as Colace) will usually help or prevent this problem from occurring.  A mild laxative (Milk of Magnesia or Miralax)  should be taken according to package instructions if there are no bowel movements after 48 hours. 6. Unless discharge instructions indicate otherwise, leave your bandage dry and in place until your next appointment in 3-5 days.  You may take a limited sponge bath.  No tube baths or  showers until the drains are removed.  You may have steri-strips (small skin tapes) in place directly over the incision.  These strips should be left on the skin for 7-10 days.  If your surgeon used skin glue on the incision, you may shower in 24 hours.  The glue will flake off over the next 2-3 weeks.  Any sutures or staples will be removed at the office during your follow-up visit. 7. DRAINS:  If you have drains in place, it is important to keep a list of the amount of drainage produced each day in your drains.  Before leaving the hospital, you should be instructed on drain care.  Call our office if you have any questions about your drains. 8. ACTIVITIES:  You may resume regular (light) daily activities beginning the next day--such as daily self-care, walking, climbing stairs--gradually increasing activities as tolerated.  You may have sexual intercourse when it is comfortable.  Refrain from any heavy lifting or straining until approved by your doctor. a. You may drive when you are no longer taking prescription pain medication, you can comfortably wear a seatbelt, and you can safely maneuver your car and apply brakes. b. RETURN TO WORK:  __________________________________________________________ 9. You should see your doctor in the office for a follow-up appointment approximately 3-5 days after your surgery.  Your doctors nurse will typically make your follow-up appointment when she calls you with your pathology report.  Expect your pathology report 2-3 business days after your surgery.  You may call to check if you do not hear from [place] after three days.   10. OTHER INSTRUCTIONS: ______________________________________________________________________________________________ ____________________________________________________________________________________________ WHEN TO CALL YOUR DOCTOR: 1. Fever over 101.0 2. Nausea and/or vomiting 3. Extreme swelling or bruising 4. Continued bleeding from  incision. 5. Increased pain, redness, or drainage from the incision. The clinic staff is available to answer your questions during regular business hours.  Please dont hesitate to call and ask to speak to one of the nurses for clinical concerns.  If you have a medical emergency, go to the nearest emergency room or call 911.  A surgeon from [facility] is always on call at the hospital. [address] [place], [place], [place]  [address] ? [address], [place], [place]   [address] [phone] ? [phone] ? FAX [phone] Web site: [email or web address]  [facility] Anest[facility] Blocks  1. Numbness or the inability to move the "blocked" extremity may last from 3-48 hours after placement. The length of time depends on the medication injected and your individual response to the medication. If the numbness is not going away after 48 hours, call your surgeon.  2. The extremity that is blocked will need to be protected until the numbness is gone and the  Strength has returned. Because you cannot feel it, you will need to take extra care to avoid injury. Because it may be weak, you may have difficulty moving it or using it. You may not know what position it is in without looking at it while the block is in effect.  3. For blocks in the legs and feet, returning to weight bearing and walking needs to be done carefully. You will need to wait until  the numbness is entirely gone and the strength has returned. You should be able to move your leg and foot normally before you try and bear weight or walk. You will need someone to be with you when you first try to ensure you do not fall and possibly risk injury.  4. Bruising and tenderness at the needle site are common side effects and will resolve in a few days.  5. Persistent numbness or new problems with movement should be communicated to the surgeon or the [place] [phone] [place] [phone]).  Post  Anesthesia Home Care Instructions  Activity: Get plenty of rest for the remainder of the day. A responsible adult should stay with you for 24 hours following the procedure.  For the next 24 hours, DO NOT: -Drive a car -Paediatric nurse -Drink alcoholic beverages -Take any medication unless instructed by your physician -Make any legal decisions or sign important papers.  Meals: Start with liquid foods such as gelatin or soup. Progress to regular foods as tolerated. Avoid greasy, spicy, heavy foods. If nausea and/or vomiting occur, drink only clear liquids until the nausea and/or vomiting subsides. Call your physician if vomiting continues.  Special Instructions/Symptoms: Your throat may feel dry or sore from the anesthesia or the breathing tube placed in your throat during surgery. If this causes discomfort, gargle with warm salt water. The discomfort should disappear within 24 hours.  If you had a scopolamine patch placed behind your ear for the management of post- operative nausea and/or vomiting:  1. The medication in the patch is effective for 72 hours, after which it should be removed.  Wrap patch in a tissue and discard in the trash. Wash hands thoroughly with soap and water. 2. You may remove the patch earlier than 72 hours if you experience unpleasant side effects which may include dry mouth, dizziness or visual disturbances. 3. Avoid touching the patch. Wash your hands with soap and water after contact with the patch.

## 2016-10-19 NOTE — Discharge Summary (Signed)
Physician Discharge Summary  Patient ID: Jessica Sherman MRN: [id number] DOB/AGE: 1931/07/08 83 y.o.  Admit date: 10/18/2016 Discharge date: 10/19/2016  Admission Diagnoses:  Discharge Diagnoses:  Active Problems:   S/P mastectomy, left   Breast cancer [facility])   Discharged Condition: good  Hospital Course: uneventful post op recovery.  Discharged POD#1  Consults: None  Significant Diagnostic Studies:   Treatments: surgery: left modified radical mastectomy  Discharge Exam: Blood pressure 128/63, pulse 65, temperature 97.6 F (36.4 C), resp. rate 18, height 5\' 2"  (1.575 m), weight 53.5 kg (118 lb), SpO2 98 %. General appearance: alert, cooperative and no distress Resp: clear to auscultation bilaterally Cardio: regular rate and rhythm, S1, S2 normal, no murmur, click, rub or gallop Incision/Wound: left chest without hematoma, drain serosang  Disposition: 01-Home or Self Care   Allergies as of 10/19/2016      Reactions   Other Nausea And Vomiting   Anesthesia "makes me very sick", Please pre-medicate to control nausea and vomiting      Medication List    TAKE these medications   amLODipine 5 MG tablet Commonly known as:  NORVASC Take 1 tablet (5 mg total) by mouth daily.   CALCIUM 600+D3 PO Take 1 tablet by mouth 2 (two) times daily.   CENTRUM SILVER PO Take 1 tablet by mouth daily.   PRESERVISION/LUTEIN Caps Take 1 capsule by mouth 2 (two) times daily.   levothyroxine 88 MCG tablet Commonly known as:  SYNTHROID, LEVOTHROID TAKE 1 TABLET BY MOUTH DAILY EXCEPT WEDNESDAY TAKE 1/2 TABLET --- Office visit needed for further refills What changed:  additional instructions   meclizine 25 MG tablet Commonly known as:  ANTIVERT TAKE 1/2 TABLET BY MOUTH THREE TIMES DAILY AS NEEDED   simvastatin 40 MG tablet Commonly known as:  ZOCOR TAKE 1 TABLET BY MOUTH DAILY   traMADol 50 MG tablet Commonly known as:  ULTRAM Take 1 tablet (50 mg total) by mouth every 6 (six)  hours as needed for severe pain.   Vitamin D3 1000 units Caps Take 1,000 Units by mouth daily.        SignedCoralie Keens A 10/19/2016, 9:19 AM

## 2016-10-19 NOTE — Progress Notes (Signed)
Patient ID: Jessica Sherman, female   DOB: [birth year], 83 y.o.   MRN: [id number]   Doing well No complaints Left mastectomy site without hematoma Drain serosang  Plan: Discharge home

## 2016-10-25 ENCOUNTER — Ambulatory Visit: Payer: PPO | Admitting: Hematology and Oncology

## 2016-10-26 ENCOUNTER — Other Ambulatory Visit (HOSPITAL_COMMUNITY): Payer: PPO

## 2016-10-26 ENCOUNTER — Encounter (HOSPITAL_COMMUNITY): Payer: PPO | Admitting: Internal Medicine

## 2016-11-06 ENCOUNTER — Other Ambulatory Visit: Payer: Self-pay

## 2016-11-06 DIAGNOSIS — C50412 Malignant neoplasm of upper-outer quadrant of left female breast: Secondary | ICD-10-CM

## 2016-11-07 ENCOUNTER — Ambulatory Visit (HOSPITAL_BASED_OUTPATIENT_CLINIC_OR_DEPARTMENT_OTHER): Payer: PPO

## 2016-11-07 ENCOUNTER — Ambulatory Visit (HOSPITAL_BASED_OUTPATIENT_CLINIC_OR_DEPARTMENT_OTHER): Payer: PPO | Admitting: Hematology and Oncology

## 2016-11-07 ENCOUNTER — Encounter: Payer: Self-pay | Admitting: Hematology and Oncology

## 2016-11-07 ENCOUNTER — Other Ambulatory Visit (HOSPITAL_BASED_OUTPATIENT_CLINIC_OR_DEPARTMENT_OTHER): Payer: PPO

## 2016-11-07 ENCOUNTER — Encounter: Payer: Self-pay | Admitting: *Deleted

## 2016-11-07 ENCOUNTER — Ambulatory Visit: Payer: PPO

## 2016-11-07 VITALS — BP 135/69 | HR 58 | Temp 97.8°F | Resp 18

## 2016-11-07 DIAGNOSIS — C50412 Malignant neoplasm of upper-outer quadrant of left female breast: Secondary | ICD-10-CM | POA: Diagnosis not present

## 2016-11-07 DIAGNOSIS — Z171 Estrogen receptor negative status [ER-]: Principal | ICD-10-CM

## 2016-11-07 DIAGNOSIS — Z5112 Encounter for antineoplastic immunotherapy: Secondary | ICD-10-CM | POA: Diagnosis not present

## 2016-11-07 DIAGNOSIS — C773 Secondary and unspecified malignant neoplasm of axilla and upper limb lymph nodes: Secondary | ICD-10-CM | POA: Diagnosis not present

## 2016-11-07 DIAGNOSIS — Z95828 Presence of other vascular implants and grafts: Secondary | ICD-10-CM

## 2016-11-07 LAB — COMPREHENSIVE METABOLIC PANEL
ALBUMIN: 3.6 g/dL (ref 3.5–5.0)
ALK PHOS: 69 U/L (ref 40–150)
ALT: 18 U/L (ref 0–55)
ANION GAP: 7 meq/L (ref 3–11)
AST: 19 U/L (ref 5–34)
BILIRUBIN TOTAL: 0.35 mg/dL (ref 0.20–1.20)
BUN: 21.2 mg/dL (ref 7.0–26.0)
CALCIUM: 9.6 mg/dL (ref 8.4–10.4)
CO2: 28 mEq/L (ref 22–29)
CREATININE: 0.8 mg/dL (ref 0.6–1.1)
Chloride: 104 mEq/L (ref 98–109)
EGFR: 68 mL/min/{1.73_m2} — ABNORMAL LOW (ref >90)
Glucose: 82 mg/dl (ref 70–140)
Potassium: 4.5 mEq/L (ref 3.5–5.1)
Sodium: 138 mEq/L (ref 136–145)
TOTAL PROTEIN: 6.8 g/dL (ref 6.4–8.3)

## 2016-11-07 LAB — CBC WITH DIFFERENTIAL/PLATELET
BASO%: 0.4 % (ref 0.0–2.0)
Basophils Absolute: 0 10*3/uL (ref 0.0–0.1)
EOS ABS: 0.2 10*3/uL (ref 0.0–0.5)
EOS%: 2.9 % (ref 0.0–7.0)
HEMATOCRIT: 32.4 % — AB (ref 34.8–46.6)
HEMOGLOBIN: 10.5 g/dL — AB (ref 11.6–15.9)
LYMPH#: 1.5 10*3/uL (ref 0.9–3.3)
LYMPH%: 26.5 % (ref 14.0–49.7)
MCH: 31.5 pg (ref 25.1–34.0)
MCHC: 32.4 g/dL (ref 31.5–36.0)
MCV: 97.3 fL (ref 79.5–101.0)
MONO#: 0.8 10*3/uL (ref 0.1–0.9)
MONO%: 13.9 % (ref 0.0–14.0)
NEUT%: 56.3 % (ref 38.4–76.8)
NEUTROS ABS: 3.1 10*3/uL (ref 1.5–6.5)
PLATELETS: 245 10*3/uL (ref 145–400)
RBC: 3.33 10*6/uL — ABNORMAL LOW (ref 3.70–5.45)
RDW: 12.9 % (ref 11.2–14.5)
WBC: 5.6 10*3/uL (ref 3.9–10.3)

## 2016-11-07 MED ORDER — DIPHENHYDRAMINE HCL 50 MG/ML IJ SOLN
12.5000 mg | Freq: Once | INTRAMUSCULAR | Status: AC
Start: 2016-11-07 — End: 2016-11-07
  Administered 2016-11-07: 12.5 mg via INTRAVENOUS

## 2016-11-07 MED ORDER — HEPARIN SOD (PORK) LOCK FLUSH 100 UNIT/ML IV SOLN
500.0000 [IU] | Freq: Once | INTRAVENOUS | Status: AC | PRN
  Administered 2016-11-07: 500 [IU]
  Filled 2016-11-07: qty 5

## 2016-11-07 MED ORDER — SODIUM CHLORIDE 0.9% FLUSH
10.0000 mL | INTRAVENOUS | Status: DC | PRN
  Administered 2016-11-07: 10 mL
  Filled 2016-11-07: qty 10

## 2016-11-07 MED ORDER — ACETAMINOPHEN 325 MG PO TABS
650.0000 mg | ORAL_TABLET | Freq: Once | ORAL | Status: AC
  Administered 2016-11-07: 650 mg via ORAL

## 2016-11-07 MED ORDER — SODIUM CHLORIDE 0.9 % IJ SOLN
10.0000 mL | INTRAMUSCULAR | Status: DC | PRN
  Administered 2016-11-07: 10 mL via INTRAVENOUS
  Filled 2016-11-07: qty 10

## 2016-11-07 MED ORDER — SODIUM CHLORIDE 0.9 % IV SOLN
420.0000 mg | Freq: Once | INTRAVENOUS | Status: AC
  Administered 2016-11-07: 420 mg via INTRAVENOUS
  Filled 2016-11-07: qty 14

## 2016-11-07 MED ORDER — TRASTUZUMAB CHEMO 150 MG IV SOLR
6.0000 mg/kg | Freq: Once | INTRAVENOUS | Status: AC
  Administered 2016-11-07: 315 mg via INTRAVENOUS
  Filled 2016-11-07: qty 15

## 2016-11-07 MED ORDER — DIPHENHYDRAMINE HCL 50 MG/ML IJ SOLN
INTRAMUSCULAR | Status: AC
  Filled 2016-11-07: qty 1

## 2016-11-07 MED ORDER — ACETAMINOPHEN 325 MG PO TABS
ORAL_TABLET | ORAL | Status: AC
  Filled 2016-11-07: qty 2

## 2016-11-07 MED ORDER — SODIUM CHLORIDE 0.9 % IV SOLN
Freq: Once | INTRAVENOUS | Status: AC
  Administered 2016-11-07: 11:00:00 via INTRAVENOUS

## 2016-11-07 NOTE — Patient Instructions (Signed)
[facility] Discharge Instructions for Patients Receiving Chemotherapy  Today you received the following chemotherapy agents Herceptin and Perjeta   To help prevent nausea and vomiting after your treatment, we encourage you to take your nausea medication as directed.    If you develop nausea and vomiting that is not controlled by your nausea medication, call the clinic.   BELOW ARE SYMPTOMS THAT SHOULD BE REPORTED IMMEDIATELY:  *FEVER GREATER THAN 100.5 F  *CHILLS WITH OR WITHOUT FEVER  NAUSEA AND VOMITING THAT IS NOT CONTROLLED WITH YOUR NAUSEA MEDICATION  *UNUSUAL SHORTNESS OF BREATH  *UNUSUAL BRUISING OR BLEEDING  TENDERNESS IN MOUTH AND THROAT WITH OR WITHOUT PRESENCE OF ULCERS  *URINARY PROBLEMS  *BOWEL PROBLEMS  UNUSUAL RASH Items with * indicate a potential emergency and should be followed up as soon as possible.  Feel free to call the clinic you have any questions or concerns. The clinic phone number is [phone].  Please show the CHEMO ALERT CARD at check-in to the Emergency Department and triage nurse.   

## 2016-11-07 NOTE — Assessment & Plan Note (Signed)
Left breast biopsy 04/12/2016: IDC grade 2-3, left axillary LN positive for metastatic IDC; mammogram revealed a 5 cm solid mass left breast 12:00 anterior depth. ER 0%, PR 0%, Ki-67 80%, HER-2 positive ratio 7.86, gene copy #16.5  Breast MRI: 05/16/2016:Left breast solid with cystic appearance OUQ 1:00 crossing into the medial left breast 4.7 x 3.3 x 4.2 cm; second 63m nodule left breast UOQ: Posterior intramammary/low axillary lymph node, indeterminate left axillary lymph nodes, skin thickening Bone scan and CT chest abdomen pelvis 05/10/2016: No metastatic disease Clinical stage: T2 N1 M0 stage IIB  Left modified radical mastectomy 10/18/2016: Complete response to treatment 0/9 lymph nodes negative [id number]  Treatment summary: 1. Neoadjuvant chemotherapy with Taxol Herceptin Perjeta. Taxol to be given weekly 12, Herceptin Perjeta to be given every 3 weeks 6 cycles 05/17/16 to 08/29/16 2. Mastectomy with sentinel lymph node biopsy/selective axillary node dissection 10/18/2016: Complete pathologic response 3. +/-adjuvant radiation ------------------------------------------------------------------------------------------------------------------- Current treatment: Maintenance Herceptin Perjeta every 3 weeks Monitoring echocardiograms for toxicities every 3 months Return to clinic every 6 weeks with labs and follow-up

## 2016-11-07 NOTE — Progress Notes (Signed)
Pt refused 30 minute post observation. Pt and VS stable at discharge.

## 2016-11-07 NOTE — Progress Notes (Signed)
Patient Care Team: Jessica Rail, MD as PCP - General (Internal Medicine)  DIAGNOSIS:  Encounter Diagnosis  Name Primary?  . Malignant neoplasm of upper-outer quadrant of left breast in female, estrogen receptor negative ([place])     SUMMARY OF ONCOLOGIC HISTORY:   Breast cancer of upper-outer quadrant of left female breast ([place])   04/12/2016 Initial Diagnosis    Left breast biopsy: IDC grade 2-3, left axillary LN positive for metastatic IDC; mammogram revealed a 5 cm solid mass left breast 12:00 anterior depth, ER 0%, PR 0%, Ki-67 80%, HER-2 positive ratio 7.6, copy #16.5      05/10/2016 Imaging    Bone scan normal; CT CAP: left breast cancer, left axillary and subpectoral nodes are not pathologic by size criteriabut suspicious, no metastatic disease      05/16/2016 Breast MRI    Left breast solid with cystic appearance OUQ 1:00 crossing into the medial left breast 4.7 x 3.3 x 4.2 cm; second 46m nodule left breast UOQ: Posterior intramammary/low axillary lymph node, indeterminate left axillary lymph nodes, skin thickening      05/17/2016 - 08/29/2016 Neo-Adjuvant Chemotherapy    Taxol weekly 12 Herceptin and Perjeta every 3 weeks 6      09/05/2016 Breast MRI    Breast MRI: Complete Imaging response      10/18/2016 Surgery    Left modified radical mastectomy: Complete response to treatment 0/9 lymph nodes negative [id number]        CHIEF COMPLIANT: Follow-up after recent mastectomy  INTERVAL HISTORY: Jessica DIXSONis a 83year old with above-mentioned history of left breast cancer was treated with neoadjuvant chemotherapy and had a complete pathologic response and ostectomy done on 10/18/2016. She is recovering very well from the surgery. She reports that her energy levels are back to normal. Her range of motion in the arm is fully normal. She is here to continue her maintenance therapy with Herceptin and Perjeta. She is seeing cardiology. Apparently they're planning to do a  stress test on her.  REVIEW OF SYSTEMS:   Constitutional: Denies fevers, chills or abnormal weight loss Eyes: Denies blurriness of vision Ears, nose, mouth, throat, and face: Denies mucositis or sore throat Respiratory: Denies cough, dyspnea or wheezes Cardiovascular: Denies palpitation, chest discomfort Gastrointestinal:  Denies nausea, heartburn or change in bowel habits Skin: Denies abnormal skin rashes Lymphatics: Denies new lymphadenopathy or easy bruising Neurological:Denies numbness, tingling or new weaknesses Behavioral/Psych: Mood is stable, no new changes  Extremities: No lower extremity edema Breast: Recent mastectomy All other systems were reviewed with the patient and are negative.  I have reviewed the past medical history, past surgical history, social history and family history with the patient and they are unchanged from previous note.  ALLERGIES:  is allergic to other.  MEDICATIONS:  Current Outpatient Prescriptions  Medication Sig Dispense Refill  . amLODipine (NORVASC) 5 MG tablet Take 1 tablet (5 mg total) by mouth daily. 180 tablet 3  . Calcium Carb-Cholecalciferol (CALCIUM 600+D3 PO) Take 1 tablet by mouth 2 (two) times daily.    . Cholecalciferol (VITAMIN D3) 1000 UNITS CAPS Take 1,000 Units by mouth daily.     .Marland Kitchenlevothyroxine (SYNTHROID, LEVOTHROID) 88 MCG tablet TAKE 1 TABLET BY MOUTH DAILY EXCEPT WEDNESDAY TAKE 1/2 TABLET --- Office visit needed for further refills 30 tablet 0  . meclizine (ANTIVERT) 25 MG tablet TAKE 1/2 TABLET BY MOUTH THREE TIMES DAILY AS NEEDED 15 tablet 2  . Multiple Vitamins-Minerals (CENTRUM SILVER PO) Take 1 tablet  by mouth daily.     . Multiple Vitamins-Minerals (PRESERVISION/LUTEIN) CAPS Take 1 capsule by mouth 2 (two) times daily.     . simvastatin (ZOCOR) 40 MG tablet TAKE 1 TABLET BY MOUTH DAILY 90 tablet 1  . traMADol (ULTRAM) 50 MG tablet Take 1 tablet (50 mg total) by mouth every 6 (six) hours as needed for severe pain. 20  tablet 0   No current facility-administered medications for this visit.    Facility-Administered Medications Ordered in Other Visits  Medication Dose Route Frequency Provider Last Rate Last Dose  . sodium chloride flush (NS) 0.9 % injection 10 mL  10 mL Intracatheter PRN Jessica Lose, MD   10 mL at 11/07/16 1242    PHYSICAL EXAMINATION: ECOG PERFORMANCE STATUS: 1 - Symptomatic but completely ambulatory  Vitals:   11/07/16 1020  BP: (!) 136/58  Pulse: 63  Resp: 18  Temp: 97.8 F (36.6 C)   Filed Weights   11/07/16 1020  Weight: 117 lb 11.2 oz (53.4 kg)    GENERAL:alert, no distress and comfortable SKIN: skin color, texture, turgor are normal, no rashes or significant lesions EYES: normal, Conjunctiva are pink and non-injected, sclera clear OROPHARYNX:no exudate, no erythema and lips, buccal mucosa, and tongue normal  NECK: supple, thyroid normal size, non-tender, without nodularity LYMPH:  no palpable lymphadenopathy in the cervical, axillary or inguinal LUNGS: clear to auscultation and percussion with normal breathing effort HEART: regular rate & rhythm and no murmurs and no lower extremity edema ABDOMEN:abdomen soft, non-tender and normal bowel sounds MUSCULOSKELETAL:no cyanosis of digits and no clubbing  NEURO: alert & oriented x 3 with fluent speech, no focal motor/sensory deficits EXTREMITIES: No lower extremity edema  LABORATORY DATA:  I have reviewed the data as listed   Chemistry      Component Value Date/Time   NA 138 11/07/2016 0929   K 4.5 11/07/2016 0929   CL 102 09/15/2014 1018   CO2 28 11/07/2016 0929   BUN 21.2 11/07/2016 0929   CREATININE 0.8 11/07/2016 0929      Component Value Date/Time   CALCIUM 9.6 11/07/2016 0929   ALKPHOS 69 11/07/2016 0929   AST 19 11/07/2016 0929   ALT 18 11/07/2016 0929   BILITOT 0.35 11/07/2016 0929       Lab Results  Component Value Date   WBC 5.6 11/07/2016   HGB 10.5 (L) 11/07/2016   HCT 32.4 (L) 11/07/2016     MCV 97.3 11/07/2016   PLT 245 11/07/2016   NEUTROABS 3.1 11/07/2016    ASSESSMENT & PLAN:  Breast cancer of upper-outer quadrant of left female breast ([place]) Left breast biopsy 04/12/2016: IDC grade 2-3, left axillary LN positive for metastatic IDC; mammogram revealed a 5 cm solid mass left breast 12:00 anterior depth. ER 0%, PR 0%, Ki-67 80%, HER-2 positive ratio 7.86, gene copy #16.5  Breast MRI: 05/16/2016:Left breast solid with cystic appearance OUQ 1:00 crossing into the medial left breast 4.7 x 3.3 x 4.2 cm; second 75m nodule left breast UOQ: Posterior intramammary/low axillary lymph node, indeterminate left axillary lymph nodes, skin thickening Bone scan and CT chest abdomen pelvis 05/10/2016: No metastatic disease Clinical stage: T2 N1 M0 stage IIB  Left modified radical mastectomy 10/18/2016: Complete response to treatment 0/9 lymph nodes negative ypT0ypN0  Treatment summary: 1. Neoadjuvant chemotherapy with Taxol Herceptin Perjeta. Taxol to be given weekly 12, Herceptin Perjeta to be given every 3 weeks 6 cycles 05/17/16 to 08/29/16 2. Mastectomy with sentinel lymph node biopsy/selective axillary node  dissection 10/18/2016: Complete pathologic response 3. +/-adjuvant radiation ------------------------------------------------------------------------------------------------------------------- Current treatment: Maintenance Herceptin Perjeta every 3 weeks Monitoring echocardiograms for toxicities every 3 months Return to clinic every 6 weeks with labs and follow-up   I spent 25 minutes talking to the patient of which more than half was spent in counseling and coordination of care.  Orders Placed This Encounter  Procedures  . Ambulatory referral to Physical Therapy    Referral Priority:   Routine    Referral Type:   Physical Medicine    Referral Reason:   Specialty Services Required    Requested Specialty:   Physical Therapy    Number of Visits Requested:   1   The  patient has a good understanding of the overall plan. she agrees with it. she will call with any problems that may develop before the next visit here.   Rulon Eisenmenger, MD 11/07/16

## 2016-11-17 ENCOUNTER — Other Ambulatory Visit: Payer: Self-pay | Admitting: Internal Medicine

## 2016-11-20 ENCOUNTER — Ambulatory Visit (HOSPITAL_BASED_OUTPATIENT_CLINIC_OR_DEPARTMENT_OTHER)
Admission: RE | Admit: 2016-11-20 | Discharge: 2016-11-20 | Disposition: A | Discharge status: Home / Self Care | Payer: PPO | Source: Ambulatory Visit | Attending: Internal Medicine | Admitting: Internal Medicine

## 2016-11-20 ENCOUNTER — Encounter (HOSPITAL_COMMUNITY): Payer: Self-pay | Admitting: Internal Medicine

## 2016-11-20 ENCOUNTER — Ambulatory Visit (HOSPITAL_COMMUNITY)
Admission: RE | Admit: 2016-11-20 | Discharge: 2016-11-20 | Disposition: A | Discharge status: Home / Self Care | Payer: PPO | Source: Ambulatory Visit | Attending: Internal Medicine | Admitting: Internal Medicine

## 2016-11-20 VITALS — BP 142/70 | HR 63 | Wt 119.2 lb

## 2016-11-20 DIAGNOSIS — C50011 Malignant neoplasm of nipple and areola, right female breast: Secondary | ICD-10-CM | POA: Diagnosis not present

## 2016-11-20 DIAGNOSIS — I351 Nonrheumatic aortic (valve) insufficiency: Secondary | ICD-10-CM | POA: Diagnosis not present

## 2016-11-20 DIAGNOSIS — Z86010 Personal history of colonic polyps: Secondary | ICD-10-CM | POA: Diagnosis not present

## 2016-11-20 DIAGNOSIS — I10 Essential (primary) hypertension: Secondary | ICD-10-CM | POA: Insufficient documentation

## 2016-11-20 DIAGNOSIS — I501 Left ventricular failure: Secondary | ICD-10-CM | POA: Insufficient documentation

## 2016-11-20 DIAGNOSIS — Z800 Family history of malignant neoplasm of digestive organs: Secondary | ICD-10-CM | POA: Insufficient documentation

## 2016-11-20 DIAGNOSIS — E785 Hyperlipidemia, unspecified: Secondary | ICD-10-CM | POA: Insufficient documentation

## 2016-11-20 DIAGNOSIS — I341 Nonrheumatic mitral (valve) prolapse: Secondary | ICD-10-CM | POA: Diagnosis not present

## 2016-11-20 DIAGNOSIS — Z9012 Acquired absence of left breast and nipple: Secondary | ICD-10-CM | POA: Insufficient documentation

## 2016-11-20 DIAGNOSIS — Z8262 Family history of osteoporosis: Secondary | ICD-10-CM | POA: Insufficient documentation

## 2016-11-20 DIAGNOSIS — I340 Nonrheumatic mitral (valve) insufficiency: Secondary | ICD-10-CM | POA: Diagnosis not present

## 2016-11-20 DIAGNOSIS — Z8249 Family history of ischemic heart disease and other diseases of the circulatory system: Secondary | ICD-10-CM | POA: Diagnosis not present

## 2016-11-20 DIAGNOSIS — M8580 Other specified disorders of bone density and structure, unspecified site: Secondary | ICD-10-CM | POA: Insufficient documentation

## 2016-11-20 DIAGNOSIS — Z9109 Other allergy status, other than to drugs and biological substances: Secondary | ICD-10-CM | POA: Diagnosis not present

## 2016-11-20 DIAGNOSIS — I510 Cardiac septal defect, acquired: Secondary | ICD-10-CM | POA: Diagnosis not present

## 2016-11-20 DIAGNOSIS — R7303 Prediabetes: Secondary | ICD-10-CM | POA: Insufficient documentation

## 2016-11-20 DIAGNOSIS — E039 Hypothyroidism, unspecified: Secondary | ICD-10-CM | POA: Diagnosis not present

## 2016-11-20 DIAGNOSIS — Z9889 Other specified postprocedural states: Secondary | ICD-10-CM | POA: Insufficient documentation

## 2016-11-20 DIAGNOSIS — Z806 Family history of leukemia: Secondary | ICD-10-CM | POA: Insufficient documentation

## 2016-11-20 DIAGNOSIS — H3530 Unspecified macular degeneration: Secondary | ICD-10-CM | POA: Insufficient documentation

## 2016-11-20 NOTE — Patient Instructions (Signed)
We will contact you in 3 months to schedule your next appointment and echocardiogram  

## 2016-11-20 NOTE — Progress Notes (Addendum)
CARDIO-ONCOLOGY CLINIC NOTE  Referring Physician: Lindi Adie Primary Cardiologist: Bensimhon   HPI:  Jessica Sherman 83 y.o. female with h/o hyperlipidemia, hypothyroidism with no known h/o heart disease referred by Dr. Lindi Adie for enrollment into the cardio-oncology program due to left breast cancer.  Diagnosed with left breast cancer 8/17.  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 LN positive for metastatic disease.    SUMMARY OF ONCOLOGIC HISTORY:       Breast cancer of upper-outer quadrant of left female breast ([place])   04/12/2016 Initial Diagnosis    Left breast biopsy: IDC grade 2-3, left axillary LN positive for metastatic IDC; mammogram revealed a 5 cm solid mass left breast 12:00 anterior depth      Doing well. Has completed adjuvant chemo. Now s/p mastectomy. Now on Herceptin/Perjeta q 3 weeks.  No CP or SOB. No edema, orthopnea or PND.  At last visit we started amlodipine 81m qhs. Tolerating well. [facility].   ECHO 8/17: EF 55-60%. Grade I DD. Mild TR/MR. LS' 11.5 GLS -20.0% ECHO 11/1: EF 55-60%. Grade I DD. Mild TR/MR. LS' 11.0 GLS -18.3% ECHO 11/20/16: EF 55-60%. Grade I DD. Mild TR/MR. LS' 11.1 GLS -20.1%   Past Medical History:  Diagnosis Date  . Hx of colonic polyps   . Hyperlipidemia   . Hypothyroidism   . Macular degeneration, dry   . Osteopenia    spine BMD T score - 1.69  . PONV (postoperative nausea and vomiting)    pt has vertigo and can not lie flat  . Prediabetes    FBS 136  on 08/29/2010; A1c 6.2%  . Vertigo     Current Outpatient Prescriptions  Medication Sig Dispense Refill  . amLODipine (NORVASC) 5 MG tablet Take 1 tablet (5 mg total) by mouth daily. 180 tablet 3  . Calcium Carb-Cholecalciferol (CALCIUM 600+D3 PO) Take 1 tablet by mouth 2  (two) times daily.    . Cholecalciferol (VITAMIN D3) 1000 UNITS CAPS Take 1,000 Units by mouth daily.     .Marland Kitchenlevothyroxine (SYNTHROID, LEVOTHROID) 88 MCG tablet TAKE 1 TABLET BY MOUTH DAILY EXCEPT WEDNESDAY TAKE 1/2 TABLET --- Office visit needed for further refills 30 tablet 0  . meclizine (ANTIVERT) 25 MG tablet TAKE 1/2 TABLET BY MOUTH THREE TIMES DAILY AS NEEDED 15 tablet 2  . Multiple Vitamins-Minerals (CENTRUM SILVER PO) Take 1 tablet by mouth daily.     . Multiple Vitamins-Minerals (PRESERVISION/LUTEIN) CAPS Take 1 capsule by mouth 2 (two) times daily.     . simvastatin (ZOCOR) 40 MG tablet TAKE 1 TABLET BY MOUTH DAILY 90 tablet 1  . traMADol (ULTRAM) 50 MG tablet Take 1 tablet (50 mg total) by mouth every 6 (six) hours as needed for severe pain. 20 tablet 0   No current facility-administered medications for this encounter.     Allergies  Allergen Reactions  . Other Nausea And Vomiting    Anesthesia "makes me very sick", Please pre-medicate to control nausea and vomiting      Social History   Social History  . Marital status: Married    Spouse name: N/A  . Number of children: N/A  . Years of education: N/A   Occupational History  . retired    Social History Main Topics  . Smoking status: Never Smoker  .  Smokeless tobacco: Never Used  . Alcohol use No     Comment:    . Drug use: No  . Sexual activity: Not on file   Other Topics Concern  . Not on file   Social History Narrative   Regular exercise- yes 70md w/o symptoms      Family History  Problem Relation Age of Onset  . Osteoporosis Mother   . Leukemia Mother   . Cancer Maternal Uncle     ? stomach  . Aneurysm Father     AAA  . Heart attack Neg Hx     Vitals:   11/20/16 0947  BP: (!) 142/70  Pulse: 63  SpO2: 100%  Weight: 119 lb 4 oz (54.1 kg)    PHYSICAL EXAM: General:  Well appearing. No respiratory difficulty HEENT: normal Neck: supple. JVP 7 Carotids 2+ bilat; no bruits. No lymphadenopathy  or thryomegaly appreciated. Cor: PMI nondisplaced. Regular rate & rhythm. 2/6 TR Lungs: clear Abdomen: soft, nontender, nondistended. No hepatosplenomegaly. No bruits or masses. Good bowel sounds. Extremities: no cyanosis, clubbing, rash, edema Neuro: alert & oriented x 3, cranial nerves grossly intact. moves all 4 extremities w/o difficulty. Affect pleasant.   ASSESSMENT & PLAN: 1. Breast cancer of upper-outer quadrant of left female breast [place] breast biopsy 04/12/2016: IDC grade 2-3, left axillary LN positive for metastatic IDC; mammogram revealed a 5 cm solid mass left breast 12:00 anterior depth. S/p mastectomy in 2/18  --has completed neoadjuvant chemotherapy with Taxol Herceptin Perjeta.  --Now on Herceptin Perjeta to be given every 3 weeks through 9/18 --I reviewed echos personally. EF and Doppler parameters stable. No HF on exam. Continue Herceptin. Repeat echo in 3 months  2. Hypertension --BP improved with amlodipine. SBP now 120-130 at oncology visits. Continue amlodipine 5 mg daily. Can increase to 10 as needed    Bensimhon, Daniel,MD 10:21 AM

## 2016-11-20 NOTE — Progress Notes (Signed)
  Echocardiogram 2D Echocardiogram has been performed.  Darlina Sicilian M 11/20/2016, 9:52 AM

## 2016-11-20 NOTE — Addendum Note (Signed)
Encounter addended by: Scarlette Calico, RN on: 11/20/2016 10:35 AM<BR>    Actions taken: Order list changed, Diagnosis association updated

## 2016-11-21 ENCOUNTER — Ambulatory Visit: Payer: PPO | Attending: Hematology and Oncology | Admitting: Physical Therapy

## 2016-11-21 ENCOUNTER — Ambulatory Visit: Payer: PPO | Admitting: Physical Therapy

## 2016-11-21 DIAGNOSIS — M25612 Stiffness of left shoulder, not elsewhere classified: Secondary | ICD-10-CM | POA: Diagnosis not present

## 2016-11-21 DIAGNOSIS — R222 Localized swelling, mass and lump, trunk: Secondary | ICD-10-CM | POA: Diagnosis not present

## 2016-11-21 NOTE — Therapy (Signed)
[place], [place], [address] Phone: [phone]   Fax:  [phone]  Physical Therapy Evaluation  Patient Details  Name: Jessica Sherman MRN: [id number] Date of Birth: January 04, 1931 Referring Provider: Dr. Lindi Adie   Encounter Date: 11/21/2016      PT End of Session - 11/21/16 1715    Visit Number 1   Number of Visits 3   Date for PT Re-Evaluation 12/22/16   PT Start Time 1430   PT Stop Time 1520   PT Time Calculation (min) 50 min   Activity Tolerance Patient tolerated treatment well;Patient limited by pain   Behavior During Therapy [facility] for tasks assessed/performed      Past Medical History:  Diagnosis Date  . Hx of colonic polyps   . Hyperlipidemia   . Hypothyroidism   . Macular degeneration, dry   . Osteopenia    spine BMD T score - 1.69  . PONV (postoperative nausea and vomiting)    pt has vertigo and can not lie flat  . Prediabetes    FBS 136  on 08/29/2010; A1c 6.2%  . Vertigo     Past Surgical History:  Procedure Laterality Date  . CATARACT EXTRACTION  2009   OS  . COLONOSCOPY W/ POLYPECTOMY     X 2; last 2012. Dr Watt Climes  . IR GENERIC HISTORICAL  05/09/2016   IR US GUIDE VASC ACCESS RIGHT 05/09/2016 Jacqulynn Cadet, MD [facility]  . IR GENERIC HISTORICAL  05/09/2016   IR FLUORO GUIDE CV LINE RIGHT 05/09/2016 Jacqulynn Cadet, MD [facility]  . MODIFIED MASTECTOMY Left 10/18/2016   Procedure: LEFT MODIFIED RADICAL MASTECTOMY;  Surgeon: [name], MD;  Location: [facility];  Service: General;  Laterality: Left;  . PILONIDAL CYST EXCISION     X 3  . TONSILLECTOMY AND ADENOIDECTOMY    . TOTAL ABDOMINAL HYSTERECTOMY     w/ bladder repair    There were no vitals filed for this visit.       Subjective Assessment - 11/21/16 1443    Subjective Pt states she is doing very well after surgery    Pertinent History left mastectomy with 9 lymph nodes removed in  Pt had 12 chemo treatments first . Past history includes vertigo and osteopenia    Patient Stated Goals to stay healthy    Currently in Pain? No/denies            [facility] PT Assessment - 11/21/16 0001      Assessment   Medical Diagnosis left breast cancer    Referring Provider Dr. Lindi Adie    Onset Date/Surgical Date 10/18/16   Hand Dominance Right     Precautions   Precautions Other (comment)   Precaution Comments previous cancer      Restrictions   Weight Bearing Restrictions No     Balance Screen   Has the patient fallen in the past 6 months No   Has the patient had a decrease in activity level because of a fear of falling?  No   Is the patient reluctant to leave their home because of a fear of falling?  No     Home Environment   Living Environment Private residence   Living Arrangements Alone   Available Help at Discharge Family;Friend(s);Available PRN/intermittently     Prior Function   Level of Independence Independent     Cognition   Overall Cognitive Status Within Functional Limits for tasks assessed  Observation/Other Assessments   Observations healing incsion in left chest and axilla with some puffines above incision  and in axilla    Skin Integrity no open areas, but healing still visible    Quick DASH  22.73     Sensation   Light Touch Impaired by gross assessment   Additional Comments pt feels chest is still numb     Coordination   Gross Motor Movements are Fluid and Coordinated Yes     Posture/Postural Control   Posture/Postural Control Postural limitations   Postural Limitations Forward head   Posture Comments Pt very thin with decreased muscle mass      ROM / Strength   AROM / PROM / Strength AROM;Strength     AROM   Right Shoulder Flexion 135 Degrees   Right Shoulder ABduction 142 Degrees   Right Shoulder Internal Rotation 50 Degrees   Right Shoulder External Rotation 90 Degrees   Left Shoulder Flexion 128 Degrees   Left Shoulder  ABduction 113 Degrees   Left Shoulder Internal Rotation 50 Degrees   Left Shoulder External Rotation 90 Degrees     Strength   Overall Strength Within functional limits for tasks performed   Overall Strength Comments limited in right arm due to pulling in chest and axilla            LYMPHEDEMA/ONCOLOGY QUESTIONNAIRE - 11/21/16 1511      Right Upper Extremity Lymphedema   10 cm Proximal to Olecranon Process 21.5 cm   Olecranon Process 20.2 cm   15 cm Proximal to Ulnar Styloid Process 19 cm   Just Proximal to Ulnar Styloid Process 13.5 cm   Across Hand at [employer] 18 cm   At [place] of 2nd Digit 5.3 cm     Left Upper Extremity Lymphedema   10 cm Proximal to Olecranon Process 22.2 cm   Olecranon Process 20.5 cm   15 cm Proximal to Ulnar Styloid Process 18.7 cm   Just Proximal to Ulnar Styloid Process 13 cm   Across Hand at [employer] 17 cm   At [place] of 2nd Digit 5.1 cm           Quick Dash - 11/21/16 0001    Open a tight or new jar Moderate difficulty   Do heavy household chores (wash walls, wash floors) Moderate difficulty   Carry a shopping bag or briefcase Moderate difficulty   Wash your back Moderate difficulty   Use a knife to cut food No difficulty   Recreational activities in which you take some force or impact through your arm, shoulder, or hand (golf, hammering, tennis) Moderate difficulty   During the past week, to what extent has your arm, shoulder or hand problem interfered with your normal social activities with family, friends, neighbors, or groups? Not at all   During the past week, to what extent has your arm, shoulder or hand problem limited your work or other regular daily activities Not at all   Arm, shoulder, or hand pain. None   Tingling (pins and needles) in your arm, shoulder, or hand None   Difficulty Sleeping No difficulty   DASH Score 22.73 %             [facility] Adult PT Treatment/Exercise - 11/21/16 0001      Self-Care    Self-Care Other Self-Care Comments   Other Self-Care Comments  pt to wear pink compression binder over chest to see if her body with absorb the fluid. she  was given information about knitted knockers and plans to go to [employer] for mastectomy bra      Shoulder Exercises: Seated   Other Seated Exercises dowel rod elevation and flexion      Shoulder Exercises: ROM/Strengthening   Wall Wash for left arm in standing                 PT Education - 11/21/16 1715    Education provided Yes   Education Details shoulder range of motion exercises, Tai Chi classess at [employer], ABC class    Person(s) Educated Patient   Methods Explanation;Demonstration;Handout   Comprehension Verbalized understanding;Returned demonstration                [facility] Goals - 11/21/16 1724      CC Long Term Goal  #1   Title Patient will be independent in a home exercise program for shoulder range of motion    Time 4   Period Weeks   Status New     CC Long Term Goal  #2   Title Patient with verbalize an understanding of lymphedema risk reduction precautions   Time 4   Period Weeks   Status New     CC Long Term Goal  #3   Title Pt will have 125 degrees of left shoulder abduction so that she can more easily perform bathing and dressing activities and receive radiation if needed    Baseline 113   Time 4   Period Weeks   Status New     CC Long Term Goal  #4   Title Pt will have decrease in Quick DASH to < 19 indicating an improvement in functional status of left UE    Baseline 22.73   Time 4   Period Weeks   Status New            Plan - 11/21/16 1716    Clinical Impression Statement 83 yo female status post mastectomy with 9 nodes removed has some edema in left chest above incision site with decreased range of motion of shoulder.  She was instructed in beginning exercise today and wants to try them at home, attend ABC class and Tai chi class and see if she  swelling comes down a bit with use of the pink post op binder and mastecomy bra that she plans to get today.  She would like to come back in 2-3 weeks to see if she can make the improvments on her own and decide at that time if she needs more physical therapy  Because of age and cormorbities and evoving status of edema and incsion healing, this is a moderate complexity eval    Rehab Potential Good   Clinical Impairments Affecting Rehab Potential 9 lymph nodes removed    PT Frequency Other (comment)  1-2 times over 4 weeks    PT Duration 4 weeks   PT Treatment/Interventions ADLs/Self Care Home Management;Patient/family education;Taping;Therapeutic activities;Therapeutic exercise;Manual lymph drainage;Scar mobilization;Passive range of motion   PT Next Visit Plan reassess range of motion and swelling .  teach manual lymph draiange if needed and decide if more sessions are needed    Consulted and Agree with Plan of Care Patient      Patient will benefit from skilled therapeutic intervention in order to improve the following deficits and impairments:  Decreased skin integrity, Increased edema, Decreased knowledge of precautions, Decreased knowledge of use of DME, Decreased range of motion, Postural dysfunction  Visit Diagnosis:  Stiffness of left shoulder, not elsewhere classified - Plan: PT plan of care cert/re-cert  Localized swelling, mass and lump, trunk - Plan: PT plan of care cert/re-cert      G-Codes - 71/69/67 1726    Functional Assessment Tool Used (Outpatient Only) Quick DASH    Functional Limitation Carrying, moving and handling objects   Carrying, Moving and Handling Objects Current Status (E9381) At least 20 percent but less than 40 percent impaired, limited or restricted   Carrying, Moving and Handling Objects Goal Status (O1751) At least 1 percent but less than 20 percent impaired, limited or restricted       Problem List Patient Active Problem List   Diagnosis Date Noted   . S/P mastectomy, left 10/18/2016  . Breast cancer ([place]) 10/18/2016  . Port catheter in place 05/23/2016  . Breast cancer of upper-outer quadrant of left female breast ([place]) 04/30/2016  . Enteritis 05/30/2013  . Prediabetes   . Macular degeneration, dry   . Vertigo   . Anemia 09/02/2010  . Vitamin D deficiency 11/17/2008  . COLONIC POLYPS, HX OF 11/17/2008  . Hypothyroidism 11/19/2007  . HYPERLIPIDEMIA 11/19/2007  . Osteopenia 02/18/2007   Donato Heinz. Owens Shark PT  Norwood Levo 11/21/2016, 5:30 PM  [place], [place], [address] Phone: [phone]   Fax:  [phone]  Name: Jessica Sherman MRN: [id number] Date of Birth: 1931-02-10

## 2016-11-21 NOTE — Patient Instructions (Addendum)
Flexion (Eccentric) - Active-Assist (Cane)          Cancer Rehab [phone]    Use unaffected arm to push affected arm forward. Avoid hiking shoulder (shoulder should NOT touch cheek). Keep palm relaxed. Slowly lower affected arm. Hold stretch for _5_ seconds repeating _5-10_ times, _1-2_ times a day.  Abduction (Eccentric) - Active-Assist (Cane)    Use unaffected arm to push affected arm out to side. Avoid hiking shoulder (shoulder should NOT touch cheek). Keep palm relaxed. Slowly lower affected arm. Hold stretch _5_ seconds repeating _5-10_ times, _1-2_ times a day    Slide a towel up the wall facing the wall and facing sideways so you can feel the stretch in your chest and in your armpit

## 2016-11-22 DIAGNOSIS — C50912 Malignant neoplasm of unspecified site of left female breast: Secondary | ICD-10-CM | POA: Diagnosis not present

## 2016-11-23 ENCOUNTER — Telehealth: Payer: Self-pay | Admitting: Internal Medicine

## 2016-11-23 NOTE — Telephone Encounter (Signed)
Pt called in and need refill on her levothyroxine (SYNTHROID, LEVOTHROID) 88 MCG tablet [[id number]]

## 2016-11-24 MED ORDER — LEVOTHYROXINE SODIUM 88 MCG PO TABS: ORAL_TABLET | ORAL | 0 refills | Status: DC

## 2016-11-24 NOTE — Telephone Encounter (Signed)
Pt called to check up on this request, please send into [facility] on [address]. She is out of this med and has appt with Dr. Quay Burow 12/2016

## 2016-11-28 ENCOUNTER — Other Ambulatory Visit: Payer: Self-pay

## 2016-11-28 ENCOUNTER — Other Ambulatory Visit (HOSPITAL_BASED_OUTPATIENT_CLINIC_OR_DEPARTMENT_OTHER): Payer: PPO

## 2016-11-28 ENCOUNTER — Ambulatory Visit (HOSPITAL_BASED_OUTPATIENT_CLINIC_OR_DEPARTMENT_OTHER): Payer: PPO

## 2016-11-28 VITALS — BP 131/57 | HR 62 | Temp 98.1°F | Resp 18

## 2016-11-28 DIAGNOSIS — Z171 Estrogen receptor negative status [ER-]: Principal | ICD-10-CM

## 2016-11-28 DIAGNOSIS — Z5112 Encounter for antineoplastic immunotherapy: Secondary | ICD-10-CM

## 2016-11-28 DIAGNOSIS — C50412 Malignant neoplasm of upper-outer quadrant of left female breast: Secondary | ICD-10-CM

## 2016-11-28 LAB — COMPREHENSIVE METABOLIC PANEL
ALT: 19 U/L (ref 0–55)
AST: 22 U/L (ref 5–34)
Albumin: 3.7 g/dL (ref 3.5–5.0)
Alkaline Phosphatase: 76 U/L (ref 40–150)
Anion Gap: 9 mEq/L (ref 3–11)
BUN: 20.3 mg/dL (ref 7.0–26.0)
CHLORIDE: 103 meq/L (ref 98–109)
CO2: 28 meq/L (ref 22–29)
Calcium: 9.9 mg/dL (ref 8.4–10.4)
Creatinine: 0.8 mg/dL (ref 0.6–1.1)
EGFR: 66 mL/min/{1.73_m2} — AB (ref >90)
GLUCOSE: 115 mg/dL (ref 70–140)
POTASSIUM: 4.3 meq/L (ref 3.5–5.1)
SODIUM: 140 meq/L (ref 136–145)
Total Bilirubin: 0.35 mg/dL (ref 0.20–1.20)
Total Protein: 7.1 g/dL (ref 6.4–8.3)

## 2016-11-28 LAB — CBC WITH DIFFERENTIAL/PLATELET
BASO%: 0.8 % (ref 0.0–2.0)
BASOS ABS: 0.1 10*3/uL (ref 0.0–0.1)
EOS ABS: 0.1 10*3/uL (ref 0.0–0.5)
EOS%: 1.8 % (ref 0.0–7.0)
HCT: 33.1 % — ABNORMAL LOW (ref 34.8–46.6)
HGB: 11.2 g/dL — ABNORMAL LOW (ref 11.6–15.9)
LYMPH%: 21.3 % (ref 14.0–49.7)
MCH: 32.1 pg (ref 25.1–34.0)
MCHC: 33.8 g/dL (ref 31.5–36.0)
MCV: 95.1 fL (ref 79.5–101.0)
MONO#: 0.7 10*3/uL (ref 0.1–0.9)
MONO%: 9.5 % (ref 0.0–14.0)
NEUT#: 4.9 10*3/uL (ref 1.5–6.5)
NEUT%: 66.6 % (ref 38.4–76.8)
Platelets: 249 10*3/uL (ref 145–400)
RBC: 3.48 10*6/uL — AB (ref 3.70–5.45)
RDW: 12.9 % (ref 11.2–14.5)
WBC: 7.4 10*3/uL (ref 3.9–10.3)
lymph#: 1.6 10*3/uL (ref 0.9–3.3)

## 2016-11-28 MED ORDER — SODIUM CHLORIDE 0.9 % IV SOLN
Freq: Once | INTRAVENOUS | Status: AC
  Administered 2016-11-28: 11:00:00 via INTRAVENOUS

## 2016-11-28 MED ORDER — DIPHENHYDRAMINE HCL 50 MG/ML IJ SOLN
INTRAMUSCULAR | Status: AC
  Filled 2016-11-28: qty 1

## 2016-11-28 MED ORDER — HEPARIN SOD (PORK) LOCK FLUSH 100 UNIT/ML IV SOLN
500.0000 [IU] | Freq: Once | INTRAVENOUS | Status: AC | PRN
  Administered 2016-11-28: 500 [IU]
  Filled 2016-11-28: qty 5

## 2016-11-28 MED ORDER — TRASTUZUMAB CHEMO 150 MG IV SOLR
6.0000 mg/kg | Freq: Once | INTRAVENOUS | Status: AC
  Administered 2016-11-28: 315 mg via INTRAVENOUS
  Filled 2016-11-28: qty 15

## 2016-11-28 MED ORDER — ACETAMINOPHEN 325 MG PO TABS
ORAL_TABLET | ORAL | Status: AC
  Filled 2016-11-28: qty 2

## 2016-11-28 MED ORDER — ACETAMINOPHEN 325 MG PO TABS
650.0000 mg | ORAL_TABLET | Freq: Once | ORAL | Status: AC
  Administered 2016-11-28: 650 mg via ORAL

## 2016-11-28 MED ORDER — SODIUM CHLORIDE 0.9 % IV SOLN
420.0000 mg | Freq: Once | INTRAVENOUS | Status: AC
  Administered 2016-11-28: 420 mg via INTRAVENOUS
  Filled 2016-11-28: qty 14

## 2016-11-28 MED ORDER — DIPHENHYDRAMINE HCL 50 MG/ML IJ SOLN
12.5000 mg | Freq: Once | INTRAMUSCULAR | Status: AC
  Administered 2016-11-28: 12.5 mg via INTRAVENOUS

## 2016-11-28 MED ORDER — SODIUM CHLORIDE 0.9% FLUSH
10.0000 mL | INTRAVENOUS | Status: DC | PRN
  Administered 2016-11-28: 10 mL
  Filled 2016-11-28: qty 10

## 2016-11-28 NOTE — Patient Instructions (Signed)
[facility] Discharge Instructions for Patients Receiving Chemotherapy  Today you received the following chemotherapy agents Herceptin and Perjeta   To help prevent nausea and vomiting after your treatment, we encourage you to take your nausea medication as directed.    If you develop nausea and vomiting that is not controlled by your nausea medication, call the clinic.   BELOW ARE SYMPTOMS THAT SHOULD BE REPORTED IMMEDIATELY:  *FEVER GREATER THAN 100.5 F  *CHILLS WITH OR WITHOUT FEVER  NAUSEA AND VOMITING THAT IS NOT CONTROLLED WITH YOUR NAUSEA MEDICATION  *UNUSUAL SHORTNESS OF BREATH  *UNUSUAL BRUISING OR BLEEDING  TENDERNESS IN MOUTH AND THROAT WITH OR WITHOUT PRESENCE OF ULCERS  *URINARY PROBLEMS  *BOWEL PROBLEMS  UNUSUAL RASH Items with * indicate a potential emergency and should be followed up as soon as possible.  Feel free to call the clinic you have any questions or concerns. The clinic phone number is [phone].  Please show the CHEMO ALERT CARD at check-in to the Emergency Department and triage nurse.   

## 2016-11-29 ENCOUNTER — Encounter (HOSPITAL_COMMUNITY): Payer: PPO | Admitting: Internal Medicine

## 2016-11-29 ENCOUNTER — Other Ambulatory Visit (HOSPITAL_COMMUNITY): Payer: PPO

## 2016-12-12 ENCOUNTER — Ambulatory Visit: Payer: PPO | Attending: Hematology and Oncology | Admitting: Physical Therapy

## 2016-12-12 DIAGNOSIS — R222 Localized swelling, mass and lump, trunk: Secondary | ICD-10-CM | POA: Diagnosis not present

## 2016-12-12 DIAGNOSIS — M25612 Stiffness of left shoulder, not elsewhere classified: Secondary | ICD-10-CM | POA: Diagnosis not present

## 2016-12-12 NOTE — Therapy (Signed)
[place], [place], [address] Phone: [phone]   Fax:  [phone]  Physical Therapy Treatment  Patient Details  Name: Jessica Sherman MRN: [id number] Date of Birth: 21-Feb-1931 Referring Provider: Dr. Lindi Adie   Encounter Date: 12/12/2016      PT End of Session - 12/12/16 1605    Visit Number 2   Number of Visits 3   Date for PT Re-Evaluation 12/22/16   PT Start Time 1400  pt late for appointment    PT Stop Time 1430   PT Time Calculation (Sherman) 30 Sherman   Activity Tolerance Patient tolerated treatment well   Behavior During Therapy [facility] for tasks assessed/performed      Past Medical History:  Diagnosis Date  . Hx of colonic polyps   . Hyperlipidemia   . Hypothyroidism   . Macular degeneration, dry   . Osteopenia    spine BMD T score - 1.69  . PONV (postoperative nausea and vomiting)    pt has vertigo and can not lie flat  . Prediabetes    FBS 136  on 08/29/2010; A1c 6.2%  . Vertigo     Past Surgical History:  Procedure Laterality Date  . CATARACT EXTRACTION  2009   OS  . COLONOSCOPY W/ POLYPECTOMY     X 2; last 2012. Dr Watt Climes  . IR GENERIC HISTORICAL  05/09/2016   IR US GUIDE VASC ACCESS RIGHT 05/09/2016 Jacqulynn Cadet, MD [facility]  . IR GENERIC HISTORICAL  05/09/2016   IR FLUORO GUIDE CV LINE RIGHT 05/09/2016 Jacqulynn Cadet, MD [facility]  . MODIFIED MASTECTOMY Left 10/18/2016   Procedure: LEFT MODIFIED RADICAL MASTECTOMY;  Surgeon: [name], MD;  Location: [place];  Service: General;  Laterality: Left;  . PILONIDAL CYST EXCISION     X 3  . TONSILLECTOMY AND ADENOIDECTOMY    . TOTAL ABDOMINAL HYSTERECTOMY     w/ bladder repair    There were no vitals filed for this visit.      Subjective Assessment - 12/12/16 1407    Subjective Pt states she still has some numbness after surgery under left arm.  " When will the numbness go away?"     Pertinent History left mastectomy with 9 lymph nodes removed in Pt had 12 chemo treatments first . Past history includes vertigo and osteopenia    Patient Stated Goals to stay healthy    Currently in Pain? No/denies            [facility] PT Assessment - 12/12/16 0001      Assessment   Medical Diagnosis left breast cancer    Referring Provider Dr. Lindi Adie    Onset Date/Surgical Date 10/18/16   Hand Dominance Right     Observation/Other Assessments   Observations developing tighness in left axilla that is not painful.  still some glue along mastectomy incision. mild puffiness above incision that appears to be resolving    Quick DASH  2.27     Sensation   Light Touch Impaired by gross assessment  numbness in left axilla      Coordination   Gross Motor Movements are Fluid and Coordinated Yes     AROM   Left Shoulder Flexion 140 Degrees   Left Shoulder ABduction 140 Degrees              Quick Dash - 12/12/16 0001    Open a tight or new jar No difficulty   Do heavy  household chores (wash walls, wash floors) No difficulty   Carry a shopping bag or briefcase No difficulty   Wash your back No difficulty   Use a knife to cut food No difficulty   Recreational activities in which you take some force or impact through your arm, shoulder, or hand (golf, hammering, tennis) No difficulty   During the past week, to what extent has your arm, shoulder or hand problem interfered with your normal social activities with family, friends, neighbors, or groups? Not at all   During the past week, to what extent has your arm, shoulder or hand problem limited your work or other regular daily activities Not at all   Arm, shoulder, or hand pain. None   Tingling (pins and needles) in your arm, shoulder, or hand Mild   Difficulty Sleeping No difficulty   DASH Score 2.27 %               [facility] Adult PT Treatment/Exercise - 12/12/16 0001      Self-Care   Other Self-Care Comments  educated in  lymphedema risk reduction.  Pt does not do airplane travel.  do not feel she needs a compression sleeve.      Shoulder Exercises: Standing   Other Standing Exercises dowel rod shoulder flexion, abduction and extension, doorway stretch for anterior chest      Shoulder Exercises: ROM/Strengthening   Wall Wash for left arm in standing    Other ROM/Strengthening Exercises standing dowel rod for flexion and abduction                 PT Education - 12/12/16 1745    Education provided Yes   Education Details standing dowel rod exercises for shoulder    Person(s) Educated Patient   Methods Explanation;Demonstration;Handout   Comprehension Verbalized understanding;Returned demonstration                [facility] Goals - 12/12/16 1410      CC Long Term Goal  #1   Title Patient will be independent in a home exercise program for shoulder range of motion    Status Achieved     CC Long Term Goal  #2   Title Patient with verbalize an understanding of lymphedema risk reduction precautions   Status Achieved     CC Long Term Goal  #3   Title Pt will have 125 degrees of left shoulder abduction so that she can more easily perform bathing and dressing activities and receive radiation if needed    Baseline 113, 12/12/2016 140 degrees    Time 4   Period Weeks   Status Achieved     CC Long Term Goal  #4   Title Pt will have decrease in Quick DASH to < 19 indicating an improvement in functional status of left UE    Baseline 22.73, 12/12/2016 2.27   Status Achieved            Plan - 12/12/16 1605    Clinical Impression Statement Pt feels that she is doing well and she has no pain.  She has regained her functional range of motion.  She is devleoping some tighness in left axilla, but feels that it is not bothering her.  Reinforced stretches and educated in lymphedema risk reduction.  Do not feel patient needs follow up PT at this time    Rehab Potential --   Clinical  Impairments Affecting Rehab Potential 9 lymph nodes removed    PT Next Visit  Plan discharge this episode.    Consulted and Agree with Plan of Care Patient      Patient will benefit from skilled therapeutic intervention in order to improve the following deficits and impairments:  Decreased skin integrity, Increased edema, Decreased knowledge of precautions, Decreased knowledge of use of DME, Decreased range of motion, Postural dysfunction  Visit Diagnosis: Stiffness of left shoulder, not elsewhere classified  Localized swelling, mass and lump, trunk       G-Codes - 12/30/16 1749    Functional Assessment Tool Used (Outpatient Only) Quick DASH    Functional Limitation Carrying, moving and handling objects   Carrying, Moving and Handling Objects Goal Status (F0071) At least 1 percent but less than 20 percent impaired, limited or restricted   Carrying, Moving and Handling Objects Discharge Status [phone]) 0 percent impaired, limited or restricted      Problem List Patient Active Problem List   Diagnosis Date Noted  . S/P mastectomy, left 10/18/2016  . Breast cancer ([place]) 10/18/2016  . Port catheter in place 05/23/2016  . Breast cancer of upper-outer quadrant of left female breast ([place]) 04/30/2016  . Enteritis 05/30/2013  . Prediabetes   . Macular degeneration, dry   . Vertigo   . Anemia 09/02/2010  . Vitamin D deficiency 11/17/2008  . COLONIC POLYPS, HX OF 11/17/2008  . Hypothyroidism 11/19/2007  . HYPERLIPIDEMIA 11/19/2007  . Osteopenia 02/18/2007   .PHYSICAL THERAPY DISCHARGE SUMMARY  Visits from Start of Care: 2  Current functional level related to goals / functional outcomes: As above    Remaining deficits: Asymptomatic tightness in left axilla    Education / Equipment: Home exercise, lymphedema risk reduction  Plan: Patient agrees to discharge.  Patient goals were met. Patient is being discharged due to being pleased with the current functional level.  ?????     Donato Heinz. Owens Shark PT  [place] Levo 12-30-16, 5:51 PM  [place], [place], [address] Phone: [phone]   Fax:  [phone]  Name: SWAYZE KOZUCH MRN: [id number] Date of Birth: [birth year]

## 2016-12-12 NOTE — Progress Notes (Signed)
Subjective:    Patient ID: Jessica Sherman, female    DOB: 1931/03/19, 84 y.o.   MRN: [id number]  HPI She is here for follow up.   Since she was here last she was diagnosed with left sided breast cancer (04/2016).  She had neo-adjuvant chemo and then a left modified radical mastectomy.   She is following with oncology and cardiology to monitor for cardiac side effects from the chemo.    Hyperlipidemia: She is taking her medication daily. She is compliant with a low fat/cholesterol diet. She is exercising regularly. She denies myalgias.   Prediabetes:  She is compliant with a low sugar/carbohydrate diet.  She is exercising regularly.  Hypertension: She is taking her medication daily - was recently started on one by cardiology. She is compliant with a low sodium diet.  She denies chest pain, palpitations, edema, shortness of breath and regular headaches. She is exercising regularly.       Medications and allergies reviewed with patient and updated if appropriate.  Patient Active Problem List   Diagnosis Date Noted  . S/P mastectomy, left 10/18/2016  . Breast cancer ([place]) 10/18/2016  . Port catheter in place 05/23/2016  . Breast cancer of upper-outer quadrant of left female breast ([place]) 04/30/2016  . Conductive hearing loss in right ear 12/09/2015  . Enteritis 05/30/2013  . Prediabetes   . Macular degeneration, dry   . Vertigo   . Anemia 09/02/2010  . Vitamin D deficiency 11/17/2008  . COLONIC POLYPS, HX OF 11/17/2008  . Hypothyroidism 11/19/2007  . HYPERLIPIDEMIA 11/19/2007  . Osteopenia 02/18/2007    Current Outpatient Prescriptions on File Prior to Visit  Medication Sig Dispense Refill  . amLODipine (NORVASC) 5 MG tablet Take 1 tablet (5 mg total) by mouth daily. 180 tablet 3  . Calcium Carb-Cholecalciferol (CALCIUM 600+D3 PO) Take 1 tablet by mouth 2 (two) times daily.    . Cholecalciferol (VITAMIN D3) 1000 UNITS CAPS Take 1,000 Units by mouth daily.     Marland Kitchen  levothyroxine (SYNTHROID, LEVOTHROID) 88 MCG tablet TAKE 1 TABLET BY MOUTH DAILY EXCEPT WEDNESDAY TAKE 1/2 TABLET 30 tablet 0  . meclizine (ANTIVERT) 25 MG tablet TAKE 1/2 TABLET BY MOUTH THREE TIMES DAILY AS NEEDED 15 tablet 2  . Multiple Vitamins-Minerals (CENTRUM SILVER PO) Take 1 tablet by mouth daily.     . Multiple Vitamins-Minerals (PRESERVISION/LUTEIN) CAPS Take 1 capsule by mouth 2 (two) times daily.     . simvastatin (ZOCOR) 40 MG tablet TAKE 1 TABLET BY MOUTH DAILY 90 tablet 1  . traMADol (ULTRAM) 50 MG tablet Take 1 tablet (50 mg total) by mouth every 6 (six) hours as needed for severe pain. 20 tablet 0  . [DISCONTINUED] prochlorperazine (COMPAZINE) 10 MG tablet Take 1 tablet (10 mg total) by mouth every 6 (six) hours as needed (Nausea or vomiting). (Patient not taking: Reported on 07/27/2016) 30 tablet 1   No current facility-administered medications on file prior to visit.     Past Medical History:  Diagnosis Date  . Hx of colonic polyps   . Hyperlipidemia   . Hypothyroidism   . Macular degeneration, dry   . Osteopenia    spine BMD T score - 1.69  . PONV (postoperative nausea and vomiting)    pt has vertigo and can not lie flat  . Prediabetes    FBS 136  on 08/29/2010; A1c 6.2%  . Vertigo     Past Surgical History:  Procedure Laterality Date  . CATARACT  EXTRACTION  2009   OS  . COLONOSCOPY W/ POLYPECTOMY     X 2; last 2012. Dr Watt Climes  . IR GENERIC HISTORICAL  05/09/2016   IR US GUIDE VASC ACCESS RIGHT 05/09/2016 Jacqulynn Cadet, MD [facility]  . IR GENERIC HISTORICAL  05/09/2016   IR FLUORO GUIDE CV LINE RIGHT 05/09/2016 Jacqulynn Cadet, MD [facility]  . MODIFIED MASTECTOMY Left 10/18/2016   Procedure: LEFT MODIFIED RADICAL MASTECTOMY;  Surgeon: [name], MD;  Location: [place];  Service: General;  Laterality: Left;  . PILONIDAL CYST EXCISION     X 3  . TONSILLECTOMY AND ADENOIDECTOMY    . TOTAL ABDOMINAL HYSTERECTOMY     w/ bladder  repair    Social History   Social History  . Marital status: Married    Spouse name: N/A  . Number of children: N/A  . Years of education: N/A   Occupational History  . retired    Social History Main Topics  . Smoking status: Never Smoker  . Smokeless tobacco: Never Used  . Alcohol use No     Comment:    . Drug use: No  . Sexual activity: Not on file   Other Topics Concern  . Not on file   Social History Narrative   Regular exercise- yes 94mpd w/o symptoms    Family History  Problem Relation Age of Onset  . Osteoporosis Mother   . Leukemia Mother   . Cancer Maternal Uncle     ? stomach  . Aneurysm Father     AAA  . Heart attack Neg Hx     Review of Systems  Constitutional: Negative for chills and fever.  Respiratory: Negative for cough, shortness of breath and wheezing.   Cardiovascular: Negative for chest pain, palpitations and leg swelling.  Neurological: Negative for light-headedness and headaches.       Objective:   Vitals:   12/13/16 0951  BP: (!) 174/78  Pulse: 68  Resp: 16  Temp: 98.2 F (36.8 C)   Filed Weights   12/13/16 0951  Weight: 118 lb (53.5 kg)   Body mass index is 20.9 kg/m.  Wt Readings from Last 3 Encounters:  12/13/16 118 lb (53.5 kg)  11/20/16 119 lb 4 oz (54.1 kg)  11/07/16 117 lb 11.2 oz (53.4 kg)     Physical Exam Constitutional: Appears well-developed and well-nourished. No distress.  HENT:  Head: Normocephalic and atraumatic.  Neck: Neck supple. No tracheal deviation present. No thyromegaly present.  No cervical lymphadenopathy Cardiovascular: Normal rate, regular rhythm and normal heart sounds.   No murmur heard. No carotid bruit .  No edema Pulmonary/Chest: Effort normal and breath sounds normal. No respiratory distress. No has no wheezes. No rales.  Skin: Skin is warm and dry. Not diaphoretic.  Psychiatric: Normal mood and affect. Behavior is normal.          Assessment & Plan:    See Problem List for  Assessment and Plan of chronic medical problems.

## 2016-12-12 NOTE — Patient Instructions (Signed)

## 2016-12-13 ENCOUNTER — Ambulatory Visit (INDEPENDENT_AMBULATORY_CARE_PROVIDER_SITE_OTHER): Payer: PPO | Admitting: Internal Medicine

## 2016-12-13 ENCOUNTER — Encounter: Payer: Self-pay | Admitting: Internal Medicine

## 2016-12-13 VITALS — BP 174/78 | HR 68 | Temp 98.2°F | Resp 16 | Ht 63.0 in | Wt 118.0 lb

## 2016-12-13 DIAGNOSIS — R7303 Prediabetes: Secondary | ICD-10-CM

## 2016-12-13 DIAGNOSIS — I10 Essential (primary) hypertension: Secondary | ICD-10-CM

## 2016-12-13 DIAGNOSIS — Z171 Estrogen receptor negative status [ER-]: Secondary | ICD-10-CM | POA: Diagnosis not present

## 2016-12-13 DIAGNOSIS — E039 Hypothyroidism, unspecified: Secondary | ICD-10-CM | POA: Diagnosis not present

## 2016-12-13 DIAGNOSIS — C50912 Malignant neoplasm of unspecified site of left female breast: Secondary | ICD-10-CM

## 2016-12-13 DIAGNOSIS — E782 Mixed hyperlipidemia: Secondary | ICD-10-CM

## 2016-12-13 NOTE — Progress Notes (Signed)
Pre visit review using our clinic review tool, if applicable. No additional management support is needed unless otherwise documented below in the visit note. 

## 2016-12-13 NOTE — Assessment & Plan Note (Signed)
Check tsh  Titrate med dose if needed  

## 2016-12-13 NOTE — Assessment & Plan Note (Signed)
Check lipid panel  Continue daily statin Regular exercise and healthy diet encouraged  

## 2016-12-13 NOTE — Assessment & Plan Note (Signed)
Check a1c Low sugar / carb diet Stressed regular exercise   

## 2016-12-13 NOTE — Assessment & Plan Note (Signed)
Following with cardiology Started on amlodipine - tolerating it well BP well controlled at home -elevated here - has some white coat htn Continue current medication

## 2016-12-13 NOTE — Patient Instructions (Addendum)
  A prescription for blood work was given.  All other Health Maintenance issues reviewed.   All recommended immunizations and age-appropriate screenings are up-to-date or discussed.  No immunizations administered today.   Medications reviewed and updated.  No changes recommended at this time.  Your prescription(s) have been submitted to your pharmacy. Please take as directed and contact our office if you believe you are having problem(s) with the medication(s).   Please followup in one year

## 2016-12-18 ENCOUNTER — Other Ambulatory Visit: Payer: Self-pay | Admitting: *Deleted

## 2016-12-18 ENCOUNTER — Other Ambulatory Visit: Payer: Self-pay

## 2016-12-18 DIAGNOSIS — Z171 Estrogen receptor negative status [ER-]: Principal | ICD-10-CM

## 2016-12-18 DIAGNOSIS — C50412 Malignant neoplasm of upper-outer quadrant of left female breast: Secondary | ICD-10-CM

## 2016-12-19 ENCOUNTER — Ambulatory Visit (HOSPITAL_BASED_OUTPATIENT_CLINIC_OR_DEPARTMENT_OTHER): Payer: PPO

## 2016-12-19 ENCOUNTER — Other Ambulatory Visit (HOSPITAL_BASED_OUTPATIENT_CLINIC_OR_DEPARTMENT_OTHER): Payer: PPO

## 2016-12-19 ENCOUNTER — Other Ambulatory Visit: Payer: Self-pay | Admitting: Internal Medicine

## 2016-12-19 ENCOUNTER — Ambulatory Visit: Payer: PPO

## 2016-12-19 VITALS — BP 148/66 | HR 58 | Temp 98.1°F | Resp 18

## 2016-12-19 DIAGNOSIS — Z95828 Presence of other vascular implants and grafts: Secondary | ICD-10-CM

## 2016-12-19 DIAGNOSIS — Z171 Estrogen receptor negative status [ER-]: Principal | ICD-10-CM

## 2016-12-19 DIAGNOSIS — C50412 Malignant neoplasm of upper-outer quadrant of left female breast: Secondary | ICD-10-CM

## 2016-12-19 DIAGNOSIS — R7303 Prediabetes: Secondary | ICD-10-CM | POA: Diagnosis not present

## 2016-12-19 DIAGNOSIS — Z5112 Encounter for antineoplastic immunotherapy: Secondary | ICD-10-CM

## 2016-12-19 DIAGNOSIS — E039 Hypothyroidism, unspecified: Secondary | ICD-10-CM | POA: Diagnosis not present

## 2016-12-19 LAB — COMPREHENSIVE METABOLIC PANEL
ALT: 18 U/L (ref 0–55)
ANION GAP: 8 meq/L (ref 3–11)
AST: 19 U/L (ref 5–34)
Albumin: 3.6 g/dL (ref 3.5–5.0)
Alkaline Phosphatase: 66 U/L (ref 40–150)
BUN: 21.3 mg/dL (ref 7.0–26.0)
CO2: 27 meq/L (ref 22–29)
CREATININE: 0.8 mg/dL (ref 0.6–1.1)
Calcium: 9.4 mg/dL (ref 8.4–10.4)
Chloride: 105 mEq/L (ref 98–109)
EGFR: 69 mL/min/{1.73_m2} — ABNORMAL LOW (ref >90)
GLUCOSE: 112 mg/dL (ref 70–140)
Potassium: 4.1 mEq/L (ref 3.5–5.1)
Sodium: 140 mEq/L (ref 136–145)
TOTAL PROTEIN: 6.8 g/dL (ref 6.4–8.3)
Total Bilirubin: 0.32 mg/dL (ref 0.20–1.20)

## 2016-12-19 LAB — CBC WITH DIFFERENTIAL/PLATELET
BASO%: 0.5 % (ref 0.0–2.0)
Basophils Absolute: 0 10*3/uL (ref 0.0–0.1)
EOS%: 1.8 % (ref 0.0–7.0)
Eosinophils Absolute: 0.1 10*3/uL (ref 0.0–0.5)
HCT: 34.1 % — ABNORMAL LOW (ref 34.8–46.6)
HGB: 11.1 g/dL — ABNORMAL LOW (ref 11.6–15.9)
LYMPH#: 1.7 10*3/uL (ref 0.9–3.3)
LYMPH%: 25.6 % (ref 14.0–49.7)
MCH: 31.3 pg (ref 25.1–34.0)
MCHC: 32.6 g/dL (ref 31.5–36.0)
MCV: 96.1 fL (ref 79.5–101.0)
MONO#: 0.8 10*3/uL (ref 0.1–0.9)
MONO%: 11.5 % (ref 0.0–14.0)
NEUT%: 60.6 % (ref 38.4–76.8)
NEUTROS ABS: 4 10*3/uL (ref 1.5–6.5)
PLATELETS: 225 10*3/uL (ref 145–400)
RBC: 3.55 10*6/uL — AB (ref 3.70–5.45)
RDW: 12.9 % (ref 11.2–14.5)
WBC: 6.6 10*3/uL (ref 3.9–10.3)

## 2016-12-19 MED ORDER — ACETAMINOPHEN 325 MG PO TABS
650.0000 mg | ORAL_TABLET | Freq: Once | ORAL | Status: AC
  Administered 2016-12-19: 650 mg via ORAL

## 2016-12-19 MED ORDER — DIPHENHYDRAMINE HCL 50 MG/ML IJ SOLN
12.5000 mg | Freq: Once | INTRAMUSCULAR | Status: AC
  Administered 2016-12-19: 12.5 mg via INTRAVENOUS

## 2016-12-19 MED ORDER — DIPHENHYDRAMINE HCL 50 MG/ML IJ SOLN
INTRAMUSCULAR | Status: AC
  Filled 2016-12-19: qty 1

## 2016-12-19 MED ORDER — ACETAMINOPHEN 325 MG PO TABS
ORAL_TABLET | ORAL | Status: AC
  Filled 2016-12-19: qty 2

## 2016-12-19 MED ORDER — SODIUM CHLORIDE 0.9% FLUSH
10.0000 mL | INTRAVENOUS | Status: DC | PRN
  Administered 2016-12-19: 10 mL
  Filled 2016-12-19: qty 10

## 2016-12-19 MED ORDER — TRASTUZUMAB CHEMO 150 MG IV SOLR
6.0000 mg/kg | Freq: Once | INTRAVENOUS | Status: AC
  Administered 2016-12-19: 315 mg via INTRAVENOUS
  Filled 2016-12-19: qty 15

## 2016-12-19 MED ORDER — SODIUM CHLORIDE 0.9 % IV SOLN
420.0000 mg | Freq: Once | INTRAVENOUS | Status: AC
  Administered 2016-12-19: 420 mg via INTRAVENOUS
  Filled 2016-12-19: qty 14

## 2016-12-19 MED ORDER — SODIUM CHLORIDE 0.9 % IV SOLN
Freq: Once | INTRAVENOUS | Status: AC
  Administered 2016-12-19: 12:00:00 via INTRAVENOUS

## 2016-12-19 MED ORDER — HEPARIN SOD (PORK) LOCK FLUSH 100 UNIT/ML IV SOLN
500.0000 [IU] | Freq: Once | INTRAVENOUS | Status: AC | PRN
  Administered 2016-12-19: 500 [IU]
  Filled 2016-12-19: qty 5

## 2016-12-19 MED ORDER — SODIUM CHLORIDE 0.9 % IJ SOLN
10.0000 mL | INTRAMUSCULAR | Status: DC | PRN
  Administered 2016-12-19: 10 mL via INTRAVENOUS
  Filled 2016-12-19: qty 10

## 2016-12-19 NOTE — Patient Instructions (Signed)
[facility] Discharge Instructions for Patients Receiving Chemotherapy  Today you received the following chemotherapy agents:  Herceptin, Perjeta  To help prevent nausea and vomiting after your treatment, we encourage you to take your nausea medication as prescribed.   If you develop nausea and vomiting that is not controlled by your nausea medication, call the clinic.   BELOW ARE SYMPTOMS THAT SHOULD BE REPORTED IMMEDIATELY:  *FEVER GREATER THAN 100.5 F  *CHILLS WITH OR WITHOUT FEVER  NAUSEA AND VOMITING THAT IS NOT CONTROLLED WITH YOUR NAUSEA MEDICATION  *UNUSUAL SHORTNESS OF BREATH  *UNUSUAL BRUISING OR BLEEDING  TENDERNESS IN MOUTH AND THROAT WITH OR WITHOUT PRESENCE OF ULCERS  *URINARY PROBLEMS  *BOWEL PROBLEMS  UNUSUAL RASH Items with * indicate a potential emergency and should be followed up as soon as possible.  Feel free to call the clinic you have any questions or concerns. The clinic phone number is [phone].  Please show the CHEMO ALERT CARD at check-in to the Emergency Department and triage nurse.   

## 2016-12-21 LAB — TSH: TSH: 0.598 u[IU]/mL (ref 0.450–4.500)

## 2016-12-21 LAB — HGB A1C W/O EAG: HEMOGLOBIN A1C: 5.6 % (ref 4.8–5.6)

## 2016-12-25 ENCOUNTER — Telehealth: Payer: Self-pay | Admitting: Internal Medicine

## 2016-12-25 NOTE — Telephone Encounter (Signed)
She had blood work done recently --  Your thyroid function is in the normal range and you should continue your current dose of your medication.  Her sugars are in the normal range.

## 2016-12-26 MED ORDER — LEVOTHYROXINE SODIUM 88 MCG PO TABS: ORAL_TABLET | ORAL | 1 refills | Status: DC

## 2016-12-26 NOTE — Telephone Encounter (Signed)
Spoke with pt to inform. Refill sent for Levothyroxine per pts request.

## 2017-01-08 ENCOUNTER — Other Ambulatory Visit: Payer: Self-pay

## 2017-01-08 DIAGNOSIS — Z171 Estrogen receptor negative status [ER-]: Principal | ICD-10-CM

## 2017-01-08 DIAGNOSIS — C50412 Malignant neoplasm of upper-outer quadrant of left female breast: Secondary | ICD-10-CM

## 2017-01-09 ENCOUNTER — Encounter: Payer: Self-pay | Admitting: Hematology and Oncology

## 2017-01-09 ENCOUNTER — Other Ambulatory Visit (HOSPITAL_BASED_OUTPATIENT_CLINIC_OR_DEPARTMENT_OTHER): Payer: PPO

## 2017-01-09 ENCOUNTER — Ambulatory Visit (HOSPITAL_BASED_OUTPATIENT_CLINIC_OR_DEPARTMENT_OTHER): Payer: PPO

## 2017-01-09 ENCOUNTER — Ambulatory Visit: Payer: PPO

## 2017-01-09 ENCOUNTER — Encounter: Payer: Self-pay | Admitting: *Deleted

## 2017-01-09 ENCOUNTER — Ambulatory Visit (HOSPITAL_BASED_OUTPATIENT_CLINIC_OR_DEPARTMENT_OTHER): Payer: PPO | Admitting: Hematology and Oncology

## 2017-01-09 DIAGNOSIS — C773 Secondary and unspecified malignant neoplasm of axilla and upper limb lymph nodes: Secondary | ICD-10-CM | POA: Diagnosis not present

## 2017-01-09 DIAGNOSIS — Z5112 Encounter for antineoplastic immunotherapy: Secondary | ICD-10-CM

## 2017-01-09 DIAGNOSIS — Z171 Estrogen receptor negative status [ER-]: Secondary | ICD-10-CM | POA: Diagnosis not present

## 2017-01-09 DIAGNOSIS — C50412 Malignant neoplasm of upper-outer quadrant of left female breast: Secondary | ICD-10-CM

## 2017-01-09 DIAGNOSIS — Z95828 Presence of other vascular implants and grafts: Secondary | ICD-10-CM

## 2017-01-09 LAB — CBC WITH DIFFERENTIAL/PLATELET
BASO%: 1 % (ref 0.0–2.0)
BASOS ABS: 0.1 10*3/uL (ref 0.0–0.1)
EOS%: 2.9 % (ref 0.0–7.0)
Eosinophils Absolute: 0.2 10*3/uL (ref 0.0–0.5)
HEMATOCRIT: 35.5 % (ref 34.8–46.6)
HEMOGLOBIN: 11.9 g/dL (ref 11.6–15.9)
LYMPH#: 1.5 10*3/uL (ref 0.9–3.3)
LYMPH%: 28.8 % (ref 14.0–49.7)
MCH: 31.9 pg (ref 25.1–34.0)
MCHC: 33.5 g/dL (ref 31.5–36.0)
MCV: 95.2 fL (ref 79.5–101.0)
MONO#: 0.6 10*3/uL (ref 0.1–0.9)
MONO%: 11.4 % (ref 0.0–14.0)
NEUT#: 2.9 10*3/uL (ref 1.5–6.5)
NEUT%: 55.9 % (ref 38.4–76.8)
Platelets: 227 10*3/uL (ref 145–400)
RBC: 3.73 10*6/uL (ref 3.70–5.45)
RDW: 12.9 % (ref 11.2–14.5)
WBC: 5.3 10*3/uL (ref 3.9–10.3)

## 2017-01-09 LAB — COMPREHENSIVE METABOLIC PANEL
ALBUMIN: 3.8 g/dL (ref 3.5–5.0)
ALK PHOS: 72 U/L (ref 40–150)
ALT: 19 U/L (ref 0–55)
ANION GAP: 9 meq/L (ref 3–11)
AST: 22 U/L (ref 5–34)
BUN: 23.8 mg/dL (ref 7.0–26.0)
CALCIUM: 10 mg/dL (ref 8.4–10.4)
CHLORIDE: 101 meq/L (ref 98–109)
CO2: 29 mEq/L (ref 22–29)
Creatinine: 1 mg/dL (ref 0.6–1.1)
EGFR: 54 mL/min/{1.73_m2} — AB (ref >90)
Glucose: 120 mg/dl (ref 70–140)
POTASSIUM: 4.4 meq/L (ref 3.5–5.1)
Sodium: 139 mEq/L (ref 136–145)
Total Bilirubin: 0.43 mg/dL (ref 0.20–1.20)
Total Protein: 7.1 g/dL (ref 6.4–8.3)

## 2017-01-09 MED ORDER — SODIUM CHLORIDE 0.9 % IJ SOLN
10.0000 mL | INTRAMUSCULAR | Status: DC | PRN
  Administered 2017-01-09: 10 mL via INTRAVENOUS
  Filled 2017-01-09: qty 10

## 2017-01-09 MED ORDER — ACETAMINOPHEN 325 MG PO TABS
ORAL_TABLET | ORAL | Status: AC
  Filled 2017-01-09: qty 2

## 2017-01-09 MED ORDER — DIPHENHYDRAMINE HCL 50 MG/ML IJ SOLN
INTRAMUSCULAR | Status: AC
  Filled 2017-01-09: qty 1

## 2017-01-09 MED ORDER — ACETAMINOPHEN 325 MG PO TABS
650.0000 mg | ORAL_TABLET | Freq: Once | ORAL | Status: AC
  Administered 2017-01-09: 650 mg via ORAL

## 2017-01-09 MED ORDER — SODIUM CHLORIDE 0.9 % IV SOLN
Freq: Once | INTRAVENOUS | Status: AC
  Administered 2017-01-09: 11:00:00 via INTRAVENOUS

## 2017-01-09 MED ORDER — SODIUM CHLORIDE 0.9% FLUSH
10.0000 mL | INTRAVENOUS | Status: DC | PRN
  Administered 2017-01-09: 10 mL
  Filled 2017-01-09: qty 10

## 2017-01-09 MED ORDER — HEPARIN SOD (PORK) LOCK FLUSH 100 UNIT/ML IV SOLN
500.0000 [IU] | Freq: Once | INTRAVENOUS | Status: AC | PRN
  Administered 2017-01-09: 500 [IU]
  Filled 2017-01-09: qty 5

## 2017-01-09 MED ORDER — DIPHENHYDRAMINE HCL 50 MG/ML IJ SOLN
12.5000 mg | Freq: Once | INTRAMUSCULAR | Status: AC
  Administered 2017-01-09: 12.5 mg via INTRAVENOUS

## 2017-01-09 MED ORDER — TRASTUZUMAB CHEMO 150 MG IV SOLR
6.0000 mg/kg | Freq: Once | INTRAVENOUS | Status: AC
  Administered 2017-01-09: 315 mg via INTRAVENOUS
  Filled 2017-01-09: qty 15

## 2017-01-09 MED ORDER — PERTUZUMAB CHEMO INJECTION 420 MG/14ML
420.0000 mg | Freq: Once | INTRAVENOUS | Status: AC
  Administered 2017-01-09: 420 mg via INTRAVENOUS
  Filled 2017-01-09: qty 14

## 2017-01-09 NOTE — Assessment & Plan Note (Signed)
Left breast biopsy 04/12/2016: IDC grade 2-3, left axillary LN positive for metastatic IDC; mammogram revealed a 5 cm solid mass left breast 12:00 anterior depth. ER 0%, PR 0%, Ki-67 80%, HER-2 positive ratio 7.86, gene copy #16.5  Breast MRI: 05/16/2016:Left breast solid with cystic appearance OUQ 1:00 crossing into the medial left breast 4.7 x 3.3 x 4.2 cm; second 50m nodule left breast UOQ: Posterior intramammary/low axillary lymph node, indeterminate left axillary lymph nodes, skin thickening Bone scan and CT chest abdomen pelvis 05/10/2016: No metastatic disease Clinical stage: T2 N1 M0 stage IIB  Left modified radical mastectomy 10/18/2016: Complete response to treatment 0/9 lymph nodes negative [id number]  Treatment summary: 1. Neoadjuvant chemotherapy with Taxol Herceptin Perjeta. Taxol to be given weekly 12, Herceptin Perjeta to be given every 3 weeks 6 cycles 05/17/16 to 08/29/16 2. Mastectomy with sentinel lymph node biopsy/selective axillary node dissection 10/18/2016: Complete pathologic response 3. +/-adjuvant radiation ------------------------------------------------------------------------------------------------------------------- Current treatment: Maintenance Herceptin Perjeta every 3 weeks Toxicities:  Labs reviewed Close monitoring for toxicities to Herceptin and Perjeta Monitoring echocardiograms for toxicities every 3 months Return to clinic every 6 weeks with labs and follow-up

## 2017-01-09 NOTE — Progress Notes (Signed)
Patient Care Team: [name], MD as PCP - General (Internal Medicine)  DIAGNOSIS:  Encounter Diagnosis  Name Primary?  . Malignant neoplasm of upper-outer quadrant of left breast in female, estrogen receptor negative (Jessica Sherman)     SUMMARY OF ONCOLOGIC HISTORY:   Breast cancer of upper-outer quadrant of left female breast ([place])   04/12/2016 Initial Diagnosis    Left breast biopsy: IDC grade 2-3, left axillary LN positive for metastatic IDC; mammogram revealed a 5 cm solid mass left breast 12:00 anterior depth, ER 0%, PR 0%, Ki-67 80%, HER-2 positive ratio 7.6, copy #16.5      05/10/2016 Imaging    Bone scan normal; CT CAP: left breast cancer, left axillary and subpectoral nodes are not pathologic by size criteriabut suspicious, no metastatic disease      05/16/2016 Breast MRI    Left breast solid with cystic appearance OUQ 1:00 crossing into the medial left breast 4.7 x 3.3 x 4.2 cm; second 22m nodule left breast UOQ: Posterior intramammary/low axillary lymph node, indeterminate left axillary lymph nodes, skin thickening      05/17/2016 - 08/29/2016 Neo-Adjuvant Chemotherapy    Taxol weekly 12 Herceptin and Perjeta every 3 weeks 6      09/05/2016 Breast MRI    Breast MRI: Complete Imaging response      10/18/2016 Surgery    Left modified radical mastectomy: Complete response to treatment 0/9 lymph nodes negative [id number]       CHIEF COMPLIANT: Follow-up on Herceptin and Perjeta  INTERVAL HISTORY: Jessica AVERITTis a 83year old with above-mentioned history left breast cancer was treated with neoadjuvant chemotherapy and had a complete pathologic response. She is currently on maintenance Herceptin and Perjeta. She is tolerating it fairly well except for fatigue on the day of treatment as well as loose stools once or twice every day but no diarrhea. She takes Imodium as needed.  REVIEW OF SYSTEMS:   Constitutional: Denies fevers, chills or abnormal weight loss, complains of  fatigue Eyes: Denies blurriness of vision Ears, nose, mouth, throat, and face: Denies mucositis or sore throat Respiratory: Denies cough, dyspnea or wheezes Cardiovascular: Denies palpitation, chest discomfort Gastrointestinal:  Loose stools Skin: Denies abnormal skin rashes Lymphatics: Denies new lymphadenopathy or easy bruising Neurological:Denies numbness, tingling or new weaknesses Behavioral/Psych: Mood is stable, no new changes  Extremities: No lower extremity edema Breast:  denies any pain or lumps or nodules in either breasts All other systems were reviewed with the patient and are negative.  I have reviewed the past medical history, past surgical history, social history and family history with the patient and they are unchanged from previous note.  ALLERGIES:  is allergic to other.  MEDICATIONS:  Current Outpatient Prescriptions  Medication Sig Dispense Refill  . amLODipine (NORVASC) 5 MG tablet Take 1 tablet (5 mg total) by mouth daily. 180 tablet 3  . Calcium Carb-Cholecalciferol (CALCIUM 600+D3 PO) Take 1 tablet by mouth 2 (two) times daily.    . Cholecalciferol (VITAMIN D3) 1000 UNITS CAPS Take 1,000 Units by mouth daily.     .Marland Kitchenlevothyroxine (SYNTHROID, LEVOTHROID) 88 MCG tablet TAKE 1 TABLET BY MOUTH DAILY EXCEPT WEDNESDAY TAKE 1/2 TABLET 90 tablet 1  . lidocaine-prilocaine (EMLA) cream APP AA ONCE  3  . meclizine (ANTIVERT) 25 MG tablet TAKE 1/2 TABLET BY MOUTH THREE TIMES DAILY AS NEEDED 15 tablet 2  . Multiple Vitamins-Minerals (CENTRUM SILVER PO) Take 1 tablet by mouth daily.     . Multiple Vitamins-Minerals (PRESERVISION/LUTEIN)  CAPS Take 1 capsule by mouth 2 (two) times daily.     . simvastatin (ZOCOR) 40 MG tablet TAKE 1 TABLET BY MOUTH DAILY 90 tablet 1  . traMADol (ULTRAM) 50 MG tablet Take 1 tablet (50 mg total) by mouth every 6 (six) hours as needed for severe pain. 20 tablet 0   No current facility-administered medications for this visit.     PHYSICAL  EXAMINATION: ECOG PERFORMANCE STATUS: 1 - Symptomatic but completely ambulatory  Vitals:   01/09/17 1021  BP: (!) 134/52  Pulse: 64  Resp: 18  Temp: 98 F (36.7 C)   Filed Weights   01/09/17 1021  Weight: 116 lb 3.2 oz (52.7 kg)    GENERAL:alert, no distress and comfortable SKIN: skin color, texture, turgor are normal, no rashes or significant lesions EYES: normal, Conjunctiva are pink and non-injected, sclera clear OROPHARYNX:no exudate, no erythema and lips, buccal mucosa, and tongue normal  NECK: supple, thyroid normal size, non-tender, without nodularity LYMPH:  no palpable lymphadenopathy in the cervical, axillary or inguinal LUNGS: clear to auscultation and percussion with normal breathing effort HEART: regular rate & rhythm and no murmurs and no lower extremity edema ABDOMEN:abdomen soft, non-tender and normal bowel sounds MUSCULOSKELETAL:no cyanosis of digits and no clubbing  NEURO: alert & oriented x 3 with fluent speech, no focal motor/sensory deficits EXTREMITIES: No lower extremity edema  LABORATORY DATA:  I have reviewed the data as listed   Chemistry      Component Value Date/Time   NA 139 01/09/2017 0916   K 4.4 01/09/2017 0916   CL 102 09/15/2014 1018   CO2 29 01/09/2017 0916   BUN 23.8 01/09/2017 0916   CREATININE 1.0 01/09/2017 0916      Component Value Date/Time   CALCIUM 10.0 01/09/2017 0916   ALKPHOS 72 01/09/2017 0916   AST 22 01/09/2017 0916   ALT 19 01/09/2017 0916   BILITOT 0.43 01/09/2017 0916       Lab Results  Component Value Date   WBC 5.3 01/09/2017   HGB 11.9 01/09/2017   HCT 35.5 01/09/2017   MCV 95.2 01/09/2017   PLT 227 01/09/2017   NEUTROABS 2.9 01/09/2017    ASSESSMENT & PLAN:  Breast cancer of upper-outer quadrant of left female breast ([place]) Left breast biopsy 04/12/2016: IDC grade 2-3, left axillary LN positive for metastatic IDC; mammogram revealed a 5 cm solid mass left breast 12:00 anterior depth. ER 0%, PR 0%,  Ki-67 80%, HER-2 positive ratio 7.86, gene copy #16.5  Breast MRI: 05/16/2016:Left breast solid with cystic appearance OUQ 1:00 crossing into the medial left breast 4.7 x 3.3 x 4.2 cm; second 62m nodule left breast UOQ: Posterior intramammary/low axillary lymph node, indeterminate left axillary lymph nodes, skin thickening Bone scan and CT chest abdomen pelvis 05/10/2016: No metastatic disease Clinical stage: T2 N1 M0 stage IIB  Left modified radical mastectomy 10/18/2016: Complete response to treatment 0/9 lymph nodes negative ypT0ypN0  Treatment summary: 1. Neoadjuvant chemotherapy with Taxol Herceptin Perjeta. Taxol to be given weekly 12, Herceptin Perjeta to be given every 3 weeks 6 cycles 05/17/16 to 08/29/16 2. Mastectomy with sentinel lymph node biopsy/selective axillary node dissection 10/18/2016: Complete pathologic response 3. +/-adjuvant radiation ------------------------------------------------------------------------------------------------------------------- Current treatment: Maintenance Herceptin Perjeta every 3 weeks Toxicities: 1. Intermittent loose stools   Labs reviewed Close monitoring for toxicities to Herceptin and Perjeta Monitoring echocardiograms for toxicities every 3 months Return to clinic every 6 weeks with labs and follow-up. [facility] and needs to move her next treatment to 4 weeks from today.  I spent 25 minutes talking to the patient of which more than half was spent in counseling and coordination of care.  No orders of the defined types were placed in this encounter.  The patient has a good understanding of the overall plan. she agrees with it. she will call with any problems that may develop before the next visit here.   Rulon Eisenmenger, MD 01/09/17

## 2017-01-09 NOTE — Patient Instructions (Signed)
[facility] Discharge Instructions for Patients Receiving Chemotherapy  Today you received the following chemotherapy agents:  Herceptin, Perjeta  To help prevent nausea and vomiting after your treatment, we encourage you to take your nausea medication as prescribed.   If you develop nausea and vomiting that is not controlled by your nausea medication, call the clinic.   BELOW ARE SYMPTOMS THAT SHOULD BE REPORTED IMMEDIATELY:  *FEVER GREATER THAN 100.5 F  *CHILLS WITH OR WITHOUT FEVER  NAUSEA AND VOMITING THAT IS NOT CONTROLLED WITH YOUR NAUSEA MEDICATION  *UNUSUAL SHORTNESS OF BREATH  *UNUSUAL BRUISING OR BLEEDING  TENDERNESS IN MOUTH AND THROAT WITH OR WITHOUT PRESENCE OF ULCERS  *URINARY PROBLEMS  *BOWEL PROBLEMS  UNUSUAL RASH Items with * indicate a potential emergency and should be followed up as soon as possible.  Feel free to call the clinic you have any questions or concerns. The clinic phone number is [phone].  Please show the CHEMO ALERT CARD at check-in to the Emergency Department and triage nurse.   

## 2017-02-02 ENCOUNTER — Other Ambulatory Visit: Payer: Self-pay

## 2017-02-02 DIAGNOSIS — C50412 Malignant neoplasm of upper-outer quadrant of left female breast: Secondary | ICD-10-CM

## 2017-02-02 DIAGNOSIS — Z171 Estrogen receptor negative status [ER-]: Principal | ICD-10-CM

## 2017-02-05 NOTE — Assessment & Plan Note (Signed)
Left breast biopsy 04/12/2016: IDC grade 2-3, left axillary LN positive for metastatic IDC; mammogram revealed a 5 cm solid mass left breast 12:00 anterior depth. ER 0%, PR 0%, Ki-67 80%, HER-2 positive ratio 7.86, gene copy #16.5  Breast MRI: 05/16/2016:Left breast solid with cystic appearance OUQ 1:00 crossing into the medial left breast 4.7 x 3.3 x 4.2 cm; second 43m nodule left breast UOQ: Posterior intramammary/low axillary lymph node, indeterminate left axillary lymph nodes, skin thickening Bone scan and CT chest abdomen pelvis 05/10/2016: No metastatic disease Clinical stage: T2 N1 M0 stage IIB  Left modified radical mastectomy 10/18/2016: Complete response to treatment 0/9 lymph nodes negative ypT0ypN0  Treatment summary: 1. Neoadjuvant chemotherapy with Taxol Herceptin Perjeta. Taxol to be given weekly 12, Herceptin Perjeta to be given every 3 weeks 6 cycles 05/17/16 to 08/29/16 2. Mastectomy with sentinel lymph node biopsy/selective axillary node dissection 10/18/2016: Complete pathologic response 3. +/-adjuvant radiation ------------------------------------------------------------------------------------------------------------------- Current treatment: Maintenance Herceptin Perjeta every 3 weeks Toxicities: 1. Intermittent loose stools   Labs reviewed Close monitoring for toxicities to Herceptin and Perjeta Monitoring echocardiograms for toxicities every 3 months Return to clinic every 6 weeks with labs and follow-up. [facility]

## 2017-02-06 ENCOUNTER — Other Ambulatory Visit: Payer: Self-pay

## 2017-02-06 ENCOUNTER — Other Ambulatory Visit (HOSPITAL_BASED_OUTPATIENT_CLINIC_OR_DEPARTMENT_OTHER): Payer: PPO

## 2017-02-06 ENCOUNTER — Ambulatory Visit (HOSPITAL_BASED_OUTPATIENT_CLINIC_OR_DEPARTMENT_OTHER): Payer: PPO | Admitting: Hematology and Oncology

## 2017-02-06 ENCOUNTER — Ambulatory Visit (HOSPITAL_BASED_OUTPATIENT_CLINIC_OR_DEPARTMENT_OTHER): Payer: PPO

## 2017-02-06 ENCOUNTER — Encounter: Payer: Self-pay | Admitting: Hematology and Oncology

## 2017-02-06 ENCOUNTER — Ambulatory Visit (HOSPITAL_COMMUNITY)
Admission: RE | Admit: 2017-02-06 | Discharge: 2017-02-06 | Disposition: A | Discharge status: Home / Self Care | Payer: PPO | Source: Ambulatory Visit | Attending: Hematology and Oncology | Admitting: Hematology and Oncology

## 2017-02-06 DIAGNOSIS — Z171 Estrogen receptor negative status [ER-]: Principal | ICD-10-CM

## 2017-02-06 DIAGNOSIS — C50412 Malignant neoplasm of upper-outer quadrant of left female breast: Secondary | ICD-10-CM

## 2017-02-06 DIAGNOSIS — Z452 Encounter for adjustment and management of vascular access device: Secondary | ICD-10-CM

## 2017-02-06 DIAGNOSIS — C50912 Malignant neoplasm of unspecified site of left female breast: Secondary | ICD-10-CM

## 2017-02-06 DIAGNOSIS — C773 Secondary and unspecified malignant neoplasm of axilla and upper limb lymph nodes: Secondary | ICD-10-CM

## 2017-02-06 DIAGNOSIS — Z95828 Presence of other vascular implants and grafts: Secondary | ICD-10-CM

## 2017-02-06 LAB — CBC WITH DIFFERENTIAL/PLATELET
BASO%: 0.4 % (ref 0.0–2.0)
Basophils Absolute: 0 10*3/uL (ref 0.0–0.1)
EOS ABS: 0.1 10*3/uL (ref 0.0–0.5)
EOS%: 2.1 % (ref 0.0–7.0)
HEMATOCRIT: 35.3 % (ref 34.8–46.6)
HGB: 11.4 g/dL — ABNORMAL LOW (ref 11.6–15.9)
LYMPH#: 1.4 10*3/uL (ref 0.9–3.3)
LYMPH%: 23.7 % (ref 14.0–49.7)
MCH: 31.4 pg (ref 25.1–34.0)
MCHC: 32.3 g/dL (ref 31.5–36.0)
MCV: 97.2 fL (ref 79.5–101.0)
MONO#: 0.7 10*3/uL (ref 0.1–0.9)
MONO%: 11.4 % (ref 0.0–14.0)
NEUT%: 62.4 % (ref 38.4–76.8)
NEUTROS ABS: 3.6 10*3/uL (ref 1.5–6.5)
PLATELETS: 239 10*3/uL (ref 145–400)
RBC: 3.63 10*6/uL — ABNORMAL LOW (ref 3.70–5.45)
RDW: 12.7 % (ref 11.2–14.5)
WBC: 5.7 10*3/uL (ref 3.9–10.3)

## 2017-02-06 LAB — COMPREHENSIVE METABOLIC PANEL
ALBUMIN: 3.7 g/dL (ref 3.5–5.0)
ALK PHOS: 74 U/L (ref 40–150)
ALT: 21 U/L (ref 0–55)
ANION GAP: 7 meq/L (ref 3–11)
AST: 25 U/L (ref 5–34)
BILIRUBIN TOTAL: 0.44 mg/dL (ref 0.20–1.20)
BUN: 19.7 mg/dL (ref 7.0–26.0)
CO2: 28 mEq/L (ref 22–29)
CREATININE: 0.8 mg/dL (ref 0.6–1.1)
Calcium: 10 mg/dL (ref 8.4–10.4)
Chloride: 103 mEq/L (ref 98–109)
EGFR: 68 mL/min/{1.73_m2} — ABNORMAL LOW (ref >90)
Glucose: 106 mg/dl (ref 70–140)
Potassium: 4.2 mEq/L (ref 3.5–5.1)
Sodium: 138 mEq/L (ref 136–145)
TOTAL PROTEIN: 7 g/dL (ref 6.4–8.3)

## 2017-02-06 LAB — ABO/RH: ABO/RH(D): A POS

## 2017-02-06 MED ORDER — SODIUM CHLORIDE 0.9 % IJ SOLN
10.0000 mL | INTRAMUSCULAR | Status: DC | PRN
  Administered 2017-02-06: 10 mL via INTRAVENOUS
  Filled 2017-02-06: qty 10

## 2017-02-06 MED ORDER — HEPARIN SOD (PORK) LOCK FLUSH 100 UNIT/ML IV SOLN
500.0000 [IU] | Freq: Once | INTRAVENOUS | Status: AC | PRN
  Administered 2017-02-06: 500 [IU] via INTRAVENOUS
  Filled 2017-02-06: qty 5

## 2017-02-06 NOTE — Progress Notes (Signed)
Patient Care Team: [name], MD as PCP - General (Internal Medicine)  DIAGNOSIS:  Encounter Diagnosis  Name Primary?  . Malignant neoplasm of upper-outer quadrant of left breast in female, estrogen receptor negative ([place])     SUMMARY OF ONCOLOGIC HISTORY:   Breast cancer of upper-outer quadrant of left female breast ([place])   04/12/2016 Initial Diagnosis    Left breast biopsy: IDC grade 2-3, left axillary LN positive for metastatic IDC; mammogram revealed a 5 cm solid mass left breast 12:00 anterior depth, ER 0%, PR 0%, Ki-67 80%, HER-2 positive ratio 7.6, copy #16.5      05/10/2016 Imaging    Bone scan normal; CT CAP: left breast cancer, left axillary and subpectoral nodes are not pathologic by size criteriabut suspicious, no metastatic disease      05/16/2016 Breast MRI    Left breast solid with cystic appearance OUQ 1:00 crossing into the medial left breast 4.7 x 3.3 x 4.2 cm; second 96m nodule left breast UOQ: Posterior intramammary/low axillary lymph node, indeterminate left axillary lymph nodes, skin thickening      05/17/2016 - 08/29/2016 Neo-Adjuvant Chemotherapy    Taxol weekly 12 Herceptin and Perjeta every 3 weeks 6      09/05/2016 Breast MRI    Breast MRI: Complete Imaging response      10/18/2016 Surgery    Left modified radical mastectomy: Complete response to treatment 0/9 lymph nodes negative ypT0ypN0        CHIEF COMPLIANT: Follow-up on maintenance Herceptin and Perjeta  INTERVAL HISTORY: GYERALDIN LITZENBERGERis a 83year old with above-mentioned history of left breast cancer treated with neoadjuvant chemotherapy with a complete pathologic response and is currently on Herceptin Perjeta maintenance. She is tolerating Minipress therapy extremely well without any major problems. She does have loose stools for which she takes Imodium. Denies any nausea vomiting. Energy levels are excellent. Denies any fevers or chills.  REVIEW OF SYSTEMS:   Constitutional:  Denies fevers, chills or abnormal weight loss Eyes: Denies blurriness of vision Ears, nose, mouth, throat, and face: Denies mucositis or sore throat Respiratory: Denies cough, dyspnea or wheezes Cardiovascular: Denies palpitation, chest discomfort Gastrointestinal:  Denies nausea, heartburn or change in bowel habits Skin: Denies abnormal skin rashes Lymphatics: Denies new lymphadenopathy or easy bruising Neurological:Denies numbness, tingling or new weaknesses Behavioral/Psych: Mood is stable, no new changes  Extremities: No lower extremity edema Breast:  denies any pain or lumps or nodules in either breasts All other systems were reviewed with the patient and are negative.  I have reviewed the past medical history, past surgical history, social history and family history with the patient and they are unchanged from previous note.  ALLERGIES:  is allergic to other.  MEDICATIONS:  Current Outpatient Prescriptions  Medication Sig Dispense Refill  . amLODipine (NORVASC) 5 MG tablet Take 1 tablet (5 mg total) by mouth daily. 180 tablet 3  . Calcium Carb-Cholecalciferol (CALCIUM 600+D3 PO) Take 1 tablet by mouth 2 (two) times daily.    . Cholecalciferol (VITAMIN D3) 1000 UNITS CAPS Take 1,000 Units by mouth daily.     .Marland Kitchenlevothyroxine (SYNTHROID, LEVOTHROID) 88 MCG tablet TAKE 1 TABLET BY MOUTH DAILY EXCEPT WEDNESDAY TAKE 1/2 TABLET 90 tablet 1  . lidocaine-prilocaine (EMLA) cream APP AA ONCE  3  . meclizine (ANTIVERT) 25 MG tablet TAKE 1/2 TABLET BY MOUTH THREE TIMES DAILY AS NEEDED 15 tablet 2  . Multiple Vitamins-Minerals (CENTRUM SILVER PO) Take 1 tablet by mouth daily.     .Marland Kitchen  Multiple Vitamins-Minerals (PRESERVISION/LUTEIN) CAPS Take 1 capsule by mouth 2 (two) times daily.     . simvastatin (ZOCOR) 40 MG tablet TAKE 1 TABLET BY MOUTH DAILY 90 tablet 1  . traMADol (ULTRAM) 50 MG tablet Take 1 tablet (50 mg total) by mouth every 6 (six) hours as needed for severe pain. 20 tablet 0   No  current facility-administered medications for this visit.     PHYSICAL EXAMINATION: ECOG PERFORMANCE STATUS: 1 - Symptomatic but completely ambulatory  There were no vitals filed for this visit. There were no vitals filed for this visit.  GENERAL:alert, no distress and comfortable SKIN: skin color, texture, turgor are normal, no rashes or significant lesions EYES: normal, Conjunctiva are pink and non-injected, sclera clear OROPHARYNX:no exudate, no erythema and lips, buccal mucosa, and tongue normal  NECK: supple, thyroid normal size, non-tender, without nodularity LYMPH:  no palpable lymphadenopathy in the cervical, axillary or inguinal LUNGS: clear to auscultation and percussion with normal breathing effort HEART: regular rate & rhythm and no murmurs and no lower extremity edema ABDOMEN:abdomen soft, non-tender and normal bowel sounds MUSCULOSKELETAL:no cyanosis of digits and no clubbing  NEURO: alert & oriented x 3 with fluent speech, no focal motor/sensory deficits EXTREMITIES: No lower extremity edema  LABORATORY DATA:  I have reviewed the data as listed   Chemistry      Component Value Date/Time   NA 139 01/09/2017 0916   K 4.4 01/09/2017 0916   CL 102 09/15/2014 1018   CO2 29 01/09/2017 0916   BUN 23.8 01/09/2017 0916   CREATININE 1.0 01/09/2017 0916      Component Value Date/Time   CALCIUM 10.0 01/09/2017 0916   ALKPHOS 72 01/09/2017 0916   AST 22 01/09/2017 0916   ALT 19 01/09/2017 0916   BILITOT 0.43 01/09/2017 0916       Lab Results  Component Value Date   WBC 5.3 01/09/2017   HGB 11.9 01/09/2017   HCT 35.5 01/09/2017   MCV 95.2 01/09/2017   PLT 227 01/09/2017   NEUTROABS 2.9 01/09/2017    ASSESSMENT & PLAN:  Breast cancer of upper-outer quadrant of left female breast ([place]) Left breast biopsy 04/12/2016: IDC grade 2-3, left axillary LN positive for metastatic IDC; mammogram revealed a 5 cm solid mass left breast 12:00 anterior depth. ER 0%, PR 0%,  Ki-67 80%, HER-2 positive ratio 7.86, gene copy #16.5  Breast MRI: 05/16/2016:Left breast solid with cystic appearance OUQ 1:00 crossing into the medial left breast 4.7 x 3.3 x 4.2 cm; second 40m nodule left breast UOQ: Posterior intramammary/low axillary lymph node, indeterminate left axillary lymph nodes, skin thickening Bone scan and CT chest abdomen pelvis 05/10/2016: No metastatic disease Clinical stage: T2 N1 M0 stage IIB  Left modified radical mastectomy 10/18/2016: Complete response to treatment 0/9 lymph nodes negative ypT0ypN0  Treatment summary: 1. Neoadjuvant chemotherapy with Taxol Herceptin Perjeta. Taxol to be given weekly 12, Herceptin Perjeta to be given every 3 weeks 6 cycles 05/17/16 to 08/29/16 2. Mastectomy with sentinel lymph node biopsy/selective axillary node dissection 10/18/2016: Complete pathologic response 3. +/-adjuvant radiation ------------------------------------------------------------------------------------------------------------------- Current treatment: Maintenance Herceptin Perjeta every 3 weeks Toxicities: 1. Intermittent loose stools   Labs reviewed Close monitoring for toxicities to Herceptin and Perjeta Monitoring echocardiograms for toxicities every 3 months Return to clinic every 6 weeks with labs and follow-up. [facility]   I spent 25 minutes talking to the patient of which more than half was  spent in counseling and coordination of care.  No orders of the defined types were placed in this encounter.  The patient has a good understanding of the overall plan. she agrees with it. she will call with any problems that may develop before the next visit here.   Rulon Eisenmenger, MD 02/06/17

## 2017-02-12 ENCOUNTER — Other Ambulatory Visit: Payer: Self-pay | Admitting: Internal Medicine

## 2017-02-21 ENCOUNTER — Encounter (HOSPITAL_COMMUNITY): Payer: Self-pay | Admitting: Internal Medicine

## 2017-02-21 ENCOUNTER — Ambulatory Visit (HOSPITAL_BASED_OUTPATIENT_CLINIC_OR_DEPARTMENT_OTHER)
Admission: RE | Admit: 2017-02-21 | Discharge: 2017-02-21 | Disposition: A | Discharge status: Home / Self Care | Payer: PPO | Source: Ambulatory Visit | Attending: Internal Medicine | Admitting: Internal Medicine

## 2017-02-21 ENCOUNTER — Ambulatory Visit (HOSPITAL_COMMUNITY)
Admission: RE | Admit: 2017-02-21 | Discharge: 2017-02-21 | Disposition: A | Discharge status: Home / Self Care | Payer: PPO | Source: Ambulatory Visit | Attending: Internal Medicine | Admitting: Internal Medicine

## 2017-02-21 VITALS — BP 162/80 | HR 76 | Wt 115.8 lb

## 2017-02-21 DIAGNOSIS — C50011 Malignant neoplasm of nipple and areola, right female breast: Secondary | ICD-10-CM | POA: Diagnosis not present

## 2017-02-21 DIAGNOSIS — I083 Combined rheumatic disorders of mitral, aortic and tricuspid valves: Secondary | ICD-10-CM | POA: Diagnosis not present

## 2017-02-21 DIAGNOSIS — I5030 Unspecified diastolic (congestive) heart failure: Secondary | ICD-10-CM | POA: Insufficient documentation

## 2017-02-21 DIAGNOSIS — C50412 Malignant neoplasm of upper-outer quadrant of left female breast: Secondary | ICD-10-CM

## 2017-02-21 NOTE — Patient Instructions (Signed)
We will contact you in 3 months to schedule your next appointment.  

## 2017-02-21 NOTE — Progress Notes (Signed)
CARDIO-ONCOLOGY CLINIC NOTE  Referring Physician: Lindi Adie Primary Cardiologist: Bensimhon   HPI:  Jessica Sherman 83 y.o. female with h/o hyperlipidemia, hypothyroidism with no known h/o heart disease referred by Dr. Lindi Adie for enrollment into the cardio-oncology program due to left breast cancer.  Diagnosed with left breast cancer 8/17.  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 LN positive for metastatic disease.    SUMMARY OF ONCOLOGIC HISTORY:       Breast cancer of upper-outer quadrant of left female breast ([place])   04/12/2016 Initial Diagnosis    Left breast biopsy: IDC grade 2-3, left axillary LN positive for metastatic IDC; mammogram revealed a 5 cm solid mass left breast 12:00 anterior depth      She returns today for follow up. She was treated with neoadjuvant chemotherapy with a complete pathologic response and is currently on Herceptin Perjeta maintenance with Dr. Lindi Adie.  Overall feeling well, denies SOB. Able to get out and do her shopping and errands. Is taking all medications, eating a healthy diet.   ECHO 8/17: EF 55-60%. Grade I DD. Mild TR/MR. LS' 11.5 GLS -20.0% ECHO 11/1: EF 55-60%. Grade I DD. Mild TR/MR. LS' 11.0 GLS -18.3% ECHO 11/20/16: EF 55-60%. Grade I DD. Mild TR/MR. LS' 11.1 GLS -20.1% ECHO 02/21/2017 EF 55-60& Grade I DD. LS" 11.3 cm/s  GLS not accurate (Dr. Haroldine Laws reviewed personally)   Past Medical History:  Diagnosis Date  . Hx of colonic polyps   . Hyperlipidemia   . Hypothyroidism   . Macular degeneration, dry   . Osteopenia    spine BMD T score - 1.69  . PONV (postoperative nausea and vomiting)    pt has vertigo and can not lie flat  . Prediabetes    FBS 136  on 08/29/2010; A1c 6.2%  . Vertigo     Current Outpatient Prescriptions  Medication Sig Dispense Refill  .  amLODipine (NORVASC) 5 MG tablet Take 1 tablet (5 mg total) by mouth daily. 180 tablet 3  . Calcium Carb-Cholecalciferol (CALCIUM 600+D3 PO) Take 1 tablet by mouth 2 (two) times daily.    . Cholecalciferol (VITAMIN D3) 1000 UNITS CAPS Take 1,000 Units by mouth daily.     Marland Kitchen levothyroxine (SYNTHROID, LEVOTHROID) 88 MCG tablet TAKE 1 TABLET BY MOUTH DAILY EXCEPT WEDNESDAY TAKE 1/2 TABLET 90 tablet 1  . lidocaine-prilocaine (EMLA) cream APP AA ONCE  3  . meclizine (ANTIVERT) 25 MG tablet TAKE 1/2 TABLET BY MOUTH THREE TIMES DAILY AS NEEDED 15 tablet 2  . Multiple Vitamins-Minerals (CENTRUM SILVER PO) Take 1 tablet by mouth daily.     . Multiple Vitamins-Minerals (PRESERVISION/LUTEIN) CAPS Take 1 capsule by mouth 2 (two) times daily.     . simvastatin (ZOCOR) 40 MG tablet TAKE 1 TABLET BY MOUTH DAILY 90 tablet 1  . traMADol (ULTRAM) 50 MG tablet Take 1 tablet (50 mg total) by mouth every 6 (six) hours as needed for severe pain. 20 tablet 0   No current facility-administered medications for this encounter.     Allergies  Allergen Reactions  . Other Nausea And Vomiting    Anesthesia "makes me very sick", Please pre-medicate to control nausea and vomiting      Social History   Social History  . Marital status: Widowed    Spouse name: N/A  . Number  of children: N/A  . Years of education: N/A   Occupational History  . retired    Social History Main Topics  . Smoking status: Never Smoker  . Smokeless tobacco: Never Used  . Alcohol use No     Comment:    . Drug use: No  . Sexual activity: Not on file   Other Topics Concern  . Not on file   Social History Narrative   Regular exercise- yes 89md w/o symptoms      Family History  Problem Relation Age of Onset  . Osteoporosis Mother   . Leukemia Mother   . Cancer Maternal Uncle        ? stomach  . Aneurysm Father        AAA  . Heart attack Neg Hx     Vitals:   02/21/17 1007  BP: (!) 162/80  Pulse: 76  SpO2: 96%  Weight:  115 lb 12.8 oz (52.5 kg)    PHYSICAL EXAM: General:  Elderly female, NAD. Walked into clinic without difficulty.  HEENT: normal Neck: supple. No JVP.  Carotids 2+ bilat; no bruits. No lymphadenopathy or thryomegaly appreciated. Cor: PMI nondisplaced. Regular rate and rhythm. No M/R/G notes.  Lungs: Clear bilaterally.  Abdomen: soft, nontender, nondistended. No hepatosplenomegaly. No bruits or masses. Bowel sounds present.  Extremities: no cyanosis, clubbing, rash. No edema.  Neuro: alert & oriented x 3, cranial nerves grossly intact. moves all 4 extremities w/o difficulty. Affect pleasant.   ASSESSMENT & PLAN: 1. [place] breast biopsy 04/12/2016: IDC grade 2-3, left axillary LN positive for metastatic IDC; mammogram revealed a 5 cm solid mass left breast 12:00 anterior depth. S/p mastectomy in 2/18  --has completed neoadjuvant chemotherapy with Taxol Herceptin Perjeta.  --Now on Herceptin Perjeta maintenance to be given every 3 weeks through 9/18 --Going back on 02/27/17 for Herceptin.  -- Echo today, will call her with results.  -- Follow up in 3 months with an Echo.   2. Hypertension -- BP elevated -- She says that sometimes she does get slightly dizzy after she takes her Norvasc at night. Will not increase Norvasc today, asked her to take a week's log of her BP's and call our office.   Follow up in 3 months with an Echo with Dr. BHaroldine Laws    EArbutus Leas NP-C 10:16 AM  Agree with above.   Echo reviewed personally. EF and doppler parameters stable (GLS not accurate). Continue Herceptin.   DJolaine ArtistMD

## 2017-02-21 NOTE — Progress Notes (Signed)
  Echocardiogram 2D Echocardiogram has been performed.  Jessica Sherman 02/21/2017, 9:44 AM

## 2017-02-27 ENCOUNTER — Other Ambulatory Visit: Payer: PPO

## 2017-02-27 ENCOUNTER — Ambulatory Visit (HOSPITAL_BASED_OUTPATIENT_CLINIC_OR_DEPARTMENT_OTHER): Payer: PPO

## 2017-02-27 VITALS — BP 162/55 | HR 66 | Temp 98.4°F | Resp 16

## 2017-02-27 DIAGNOSIS — Z5112 Encounter for antineoplastic immunotherapy: Secondary | ICD-10-CM | POA: Diagnosis not present

## 2017-02-27 DIAGNOSIS — C50412 Malignant neoplasm of upper-outer quadrant of left female breast: Secondary | ICD-10-CM

## 2017-02-27 DIAGNOSIS — Z171 Estrogen receptor negative status [ER-]: Principal | ICD-10-CM

## 2017-02-27 MED ORDER — DIPHENHYDRAMINE HCL 50 MG/ML IJ SOLN
INTRAMUSCULAR | Status: AC
  Filled 2017-02-27: qty 1

## 2017-02-27 MED ORDER — HEPARIN SOD (PORK) LOCK FLUSH 100 UNIT/ML IV SOLN
500.0000 [IU] | Freq: Once | INTRAVENOUS | Status: AC | PRN
  Administered 2017-02-27: 500 [IU]
  Filled 2017-02-27: qty 5

## 2017-02-27 MED ORDER — ACETAMINOPHEN 325 MG PO TABS
ORAL_TABLET | ORAL | Status: AC
  Filled 2017-02-27: qty 2

## 2017-02-27 MED ORDER — ACETAMINOPHEN 325 MG PO TABS
650.0000 mg | ORAL_TABLET | Freq: Once | ORAL | Status: AC
  Administered 2017-02-27: 650 mg via ORAL

## 2017-02-27 MED ORDER — SODIUM CHLORIDE 0.9 % IV SOLN
420.0000 mg | Freq: Once | INTRAVENOUS | Status: AC
  Administered 2017-02-27: 420 mg via INTRAVENOUS
  Filled 2017-02-27: qty 14

## 2017-02-27 MED ORDER — SODIUM CHLORIDE 0.9% FLUSH
10.0000 mL | INTRAVENOUS | Status: DC | PRN
  Administered 2017-02-27: 10 mL
  Filled 2017-02-27: qty 10

## 2017-02-27 MED ORDER — DIPHENHYDRAMINE HCL 50 MG/ML IJ SOLN
12.5000 mg | Freq: Once | INTRAMUSCULAR | Status: AC
Start: 2017-02-27 — End: 2017-02-27
  Administered 2017-02-27: 12.5 mg via INTRAVENOUS

## 2017-02-27 MED ORDER — SODIUM CHLORIDE 0.9 % IV SOLN
Freq: Once | INTRAVENOUS | Status: AC
  Administered 2017-02-27: 11:00:00 via INTRAVENOUS

## 2017-02-27 MED ORDER — SODIUM CHLORIDE 0.9 % IV SOLN
6.0000 mg/kg | Freq: Once | INTRAVENOUS | Status: AC
  Administered 2017-02-27: 315 mg via INTRAVENOUS
  Filled 2017-02-27: qty 15

## 2017-02-27 NOTE — Patient Instructions (Signed)
[facility] Discharge Instructions for Patients Receiving Chemotherapy  Today you received the following chemotherapy agents herceptin/perjeta  To help prevent nausea and vomiting after your treatment, we encourage you to take your nausea medication as directed   If you develop nausea and vomiting that is not controlled by your nausea medication, call the clinic.   BELOW ARE SYMPTOMS THAT SHOULD BE REPORTED IMMEDIATELY:  *FEVER GREATER THAN 100.5 F  *CHILLS WITH OR WITHOUT FEVER  NAUSEA AND VOMITING THAT IS NOT CONTROLLED WITH YOUR NAUSEA MEDICATION  *UNUSUAL SHORTNESS OF BREATH  *UNUSUAL BRUISING OR BLEEDING  TENDERNESS IN MOUTH AND THROAT WITH OR WITHOUT PRESENCE OF ULCERS  *URINARY PROBLEMS  *BOWEL PROBLEMS  UNUSUAL RASH Items with * indicate a potential emergency and should be followed up as soon as possible.  Feel free to call the clinic you have any questions or concerns. The clinic phone number is [phone].  

## 2017-03-20 ENCOUNTER — Ambulatory Visit (HOSPITAL_BASED_OUTPATIENT_CLINIC_OR_DEPARTMENT_OTHER): Payer: PPO

## 2017-03-20 ENCOUNTER — Encounter: Payer: Self-pay | Admitting: Hematology and Oncology

## 2017-03-20 ENCOUNTER — Other Ambulatory Visit (HOSPITAL_BASED_OUTPATIENT_CLINIC_OR_DEPARTMENT_OTHER): Payer: PPO

## 2017-03-20 ENCOUNTER — Ambulatory Visit: Payer: PPO

## 2017-03-20 ENCOUNTER — Ambulatory Visit (HOSPITAL_BASED_OUTPATIENT_CLINIC_OR_DEPARTMENT_OTHER): Payer: PPO | Admitting: Hematology and Oncology

## 2017-03-20 VITALS — BP 131/64 | HR 57 | Temp 97.5°F | Resp 18

## 2017-03-20 DIAGNOSIS — C50412 Malignant neoplasm of upper-outer quadrant of left female breast: Secondary | ICD-10-CM | POA: Diagnosis not present

## 2017-03-20 DIAGNOSIS — Z5112 Encounter for antineoplastic immunotherapy: Secondary | ICD-10-CM | POA: Diagnosis not present

## 2017-03-20 DIAGNOSIS — C773 Secondary and unspecified malignant neoplasm of axilla and upper limb lymph nodes: Secondary | ICD-10-CM | POA: Diagnosis not present

## 2017-03-20 DIAGNOSIS — Z171 Estrogen receptor negative status [ER-]: Principal | ICD-10-CM

## 2017-03-20 DIAGNOSIS — Z95828 Presence of other vascular implants and grafts: Secondary | ICD-10-CM

## 2017-03-20 LAB — CBC WITH DIFFERENTIAL/PLATELET
BASO%: 1.1 % (ref 0.0–2.0)
Basophils Absolute: 0.1 10*3/uL (ref 0.0–0.1)
EOS%: 2.1 % (ref 0.0–7.0)
Eosinophils Absolute: 0.1 10*3/uL (ref 0.0–0.5)
HCT: 36.9 % (ref 34.8–46.6)
HGB: 12.3 g/dL (ref 11.6–15.9)
LYMPH%: 29.1 % (ref 14.0–49.7)
MCH: 31.5 pg (ref 25.1–34.0)
MCHC: 33.2 g/dL (ref 31.5–36.0)
MCV: 94.7 fL (ref 79.5–101.0)
MONO#: 0.6 10*3/uL (ref 0.1–0.9)
MONO%: 10 % (ref 0.0–14.0)
NEUT%: 57.7 % (ref 38.4–76.8)
NEUTROS ABS: 3.7 10*3/uL (ref 1.5–6.5)
Platelets: 222 10*3/uL (ref 145–400)
RBC: 3.9 10*6/uL (ref 3.70–5.45)
RDW: 12.9 % (ref 11.2–14.5)
WBC: 6.3 10*3/uL (ref 3.9–10.3)
lymph#: 1.8 10*3/uL (ref 0.9–3.3)

## 2017-03-20 LAB — COMPREHENSIVE METABOLIC PANEL
ALT: 18 U/L (ref 0–55)
AST: 22 U/L (ref 5–34)
Albumin: 3.8 g/dL (ref 3.5–5.0)
Alkaline Phosphatase: 77 U/L (ref 40–150)
Anion Gap: 10 mEq/L (ref 3–11)
BILIRUBIN TOTAL: 0.54 mg/dL (ref 0.20–1.20)
BUN: 19.8 mg/dL (ref 7.0–26.0)
CO2: 29 meq/L (ref 22–29)
Calcium: 10 mg/dL (ref 8.4–10.4)
Chloride: 102 mEq/L (ref 98–109)
Creatinine: 0.9 mg/dL (ref 0.6–1.1)
EGFR: 60 mL/min/{1.73_m2} — AB (ref >90)
GLUCOSE: 118 mg/dL (ref 70–140)
Potassium: 4.2 mEq/L (ref 3.5–5.1)
SODIUM: 142 meq/L (ref 136–145)
TOTAL PROTEIN: 7.2 g/dL (ref 6.4–8.3)

## 2017-03-20 MED ORDER — HEPARIN SOD (PORK) LOCK FLUSH 100 UNIT/ML IV SOLN
500.0000 [IU] | Freq: Once | INTRAVENOUS | Status: AC | PRN
  Administered 2017-03-20: 500 [IU]
  Filled 2017-03-20: qty 5

## 2017-03-20 MED ORDER — DIPHENHYDRAMINE HCL 50 MG/ML IJ SOLN
INTRAMUSCULAR | Status: AC
  Filled 2017-03-20: qty 1

## 2017-03-20 MED ORDER — SODIUM CHLORIDE 0.9 % IJ SOLN
10.0000 mL | INTRAMUSCULAR | Status: DC | PRN
  Administered 2017-03-20: 10 mL via INTRAVENOUS
  Filled 2017-03-20: qty 10

## 2017-03-20 MED ORDER — ACETAMINOPHEN 325 MG PO TABS
650.0000 mg | ORAL_TABLET | Freq: Once | ORAL | Status: AC
  Administered 2017-03-20: 650 mg via ORAL

## 2017-03-20 MED ORDER — SODIUM CHLORIDE 0.9 % IV SOLN
420.0000 mg | Freq: Once | INTRAVENOUS | Status: AC
  Administered 2017-03-20: 420 mg via INTRAVENOUS
  Filled 2017-03-20: qty 14

## 2017-03-20 MED ORDER — ACETAMINOPHEN 325 MG PO TABS
ORAL_TABLET | ORAL | Status: AC
  Filled 2017-03-20: qty 2

## 2017-03-20 MED ORDER — SODIUM CHLORIDE 0.9 % IV SOLN
Freq: Once | INTRAVENOUS | Status: AC
  Administered 2017-03-20: 13:00:00 via INTRAVENOUS

## 2017-03-20 MED ORDER — SODIUM CHLORIDE 0.9% FLUSH
10.0000 mL | INTRAVENOUS | Status: DC | PRN
  Administered 2017-03-20: 10 mL
  Filled 2017-03-20: qty 10

## 2017-03-20 MED ORDER — DIPHENHYDRAMINE HCL 50 MG/ML IJ SOLN
12.5000 mg | Freq: Once | INTRAMUSCULAR | Status: AC
  Administered 2017-03-20: 12.5 mg via INTRAVENOUS

## 2017-03-20 MED ORDER — TRASTUZUMAB CHEMO 150 MG IV SOLR
6.0000 mg/kg | Freq: Once | INTRAVENOUS | Status: AC
  Administered 2017-03-20: 315 mg via INTRAVENOUS
  Filled 2017-03-20: qty 15

## 2017-03-20 NOTE — Assessment & Plan Note (Signed)
Left breast biopsy 04/12/2016: IDC grade 2-3, left axillary LN positive for metastatic IDC; mammogram revealed a 5 cm solid mass left breast 12:00 anterior depth. ER 0%, PR 0%, Ki-67 80%, HER-2 positive ratio 7.86, gene copy #16.5  Breast MRI: 05/16/2016:Left breast solid with cystic appearance OUQ 1:00 crossing into the medial left breast 4.7 x 3.3 x 4.2 cm; second 40m nodule left breast UOQ: Posterior intramammary/low axillary lymph node, indeterminate left axillary lymph nodes, skin thickening Bone scan and CT chest abdomen pelvis 05/10/2016: No metastatic disease Clinical stage: T2 N1 M0 stage IIB  Left modified radical mastectomy 10/18/2016: Complete response to treatment 0/9 lymph nodes negative [id number]  Treatment summary: 1. Neoadjuvant chemotherapy with Taxol Herceptin Perjeta. Taxol to be given weekly 12, Herceptin Perjeta to be given every 3 weeks 6 cycles 05/17/16 to 08/29/16 2. Mastectomy with sentinel lymph node biopsy/selective axillary node dissection 10/18/2016: Complete pathologic response 3. +/-adjuvant radiation ------------------------------------------------------------------------------------------------------------------- Current treatment: Maintenance Herceptin Perjeta every 3 weeks to be completed end of August 2018 Toxicities: 1.Intermittent loose stools  Labs reviewed Close monitoring for toxicities to Herceptin and Perjeta Monitoring echocardiograms for toxicities every 3 months Return to clinic every 6 weeks with labs and follow-up. [facility] plans to [facility] in a few weeks

## 2017-03-20 NOTE — Patient Instructions (Signed)
[facility] Discharge Instructions for Patients Receiving Chemotherapy  Today you received the following chemotherapy agents Herceptin and Perjeta.   To help prevent nausea and vomiting after your treatment, we encourage you to take your nausea medication as directed.    If you develop nausea and vomiting that is not controlled by your nausea medication, call the clinic.   BELOW ARE SYMPTOMS THAT SHOULD BE REPORTED IMMEDIATELY:  *FEVER GREATER THAN 100.5 F  *CHILLS WITH OR WITHOUT FEVER  NAUSEA AND VOMITING THAT IS NOT CONTROLLED WITH YOUR NAUSEA MEDICATION  *UNUSUAL SHORTNESS OF BREATH  *UNUSUAL BRUISING OR BLEEDING  TENDERNESS IN MOUTH AND THROAT WITH OR WITHOUT PRESENCE OF ULCERS  *URINARY PROBLEMS  *BOWEL PROBLEMS  UNUSUAL RASH Items with * indicate a potential emergency and should be followed up as soon as possible.  Feel free to call the clinic you have any questions or concerns. The clinic phone number is [phone].    

## 2017-03-20 NOTE — Progress Notes (Signed)
Patient Care Team: [name], MD as PCP - General (Internal Medicine)  DIAGNOSIS:  Encounter Diagnosis  Name Primary?  . Malignant neoplasm of upper-outer quadrant of left breast in female, estrogen receptor negative ([place])     SUMMARY OF ONCOLOGIC HISTORY:   Breast cancer of upper-outer quadrant of left female breast ([place])   04/12/2016 Initial Diagnosis    Left breast biopsy: IDC grade 2-3, left axillary LN positive for metastatic IDC; mammogram revealed a 5 cm solid mass left breast 12:00 anterior depth, ER 0%, PR 0%, Ki-67 80%, HER-2 positive ratio 7.6, copy #16.5      05/10/2016 Imaging    Bone scan normal; CT CAP: left breast cancer, left axillary and subpectoral nodes are not pathologic by size criteriabut suspicious, no metastatic disease      05/16/2016 Breast MRI    Left breast solid with cystic appearance OUQ 1:00 crossing into the medial left breast 4.7 x 3.3 x 4.2 cm; second 67m nodule left breast UOQ: Posterior intramammary/low axillary lymph node, indeterminate left axillary lymph nodes, skin thickening      05/17/2016 - 08/29/2016 Neo-Adjuvant Chemotherapy    Taxol weekly 12 Herceptin and Perjeta every 3 weeks 6      09/05/2016 Breast MRI    Breast MRI: Complete Imaging response      10/18/2016 Surgery    Left modified radical mastectomy: Complete response to treatment 0/9 lymph nodes negative [id number]        CHIEF COMPLIANT: Herceptin and Perjeta maintenance  INTERVAL HISTORY: Jessica HECKSTALLis a 83year old with above-mentioned history of left breast cancer treated with neoadjuvant chemotherapy and had a complete pathologic response and underwent a mastectomy. She is currently on Herceptin and Perjeta maintenance. She has intermittent diarrhea. The diarrhea has been well controlled with Imodium. She gets one episode of diarrhea every morning. She has been drinking a lot of fluids and keeping herself hydrated.  REVIEW OF SYSTEMS:   Constitutional:  Denies fevers, chills or abnormal weight loss Eyes: Denies blurriness of vision Ears, nose, mouth, throat, and face: Denies mucositis or sore throat Respiratory: Denies cough, dyspnea or wheezes Cardiovascular: Denies palpitation, chest discomfort Gastrointestinal:  Denies nausea, heartburn or change in bowel habits Skin: Denies abnormal skin rashes Lymphatics: Denies new lymphadenopathy or easy bruising Neurological:Denies numbness, tingling or new weaknesses Behavioral/Psych: Mood is stable, no new changes  Extremities: No lower extremity edema Breast:  Left mastectomy All other systems were reviewed with the patient and are negative.  I have reviewed the past medical history, past surgical history, social history and family history with the patient and they are unchanged from previous note.  ALLERGIES:  is allergic to other.  MEDICATIONS:  Current Outpatient Prescriptions  Medication Sig Dispense Refill  . amLODipine (NORVASC) 5 MG tablet Take 1 tablet (5 mg total) by mouth daily. 180 tablet 3  . Calcium Carb-Cholecalciferol (CALCIUM 600+D3 PO) Take 1 tablet by mouth 2 (two) times daily.    . Cholecalciferol (VITAMIN D3) 1000 UNITS CAPS Take 1,000 Units by mouth daily.     .Marland Kitchenlevothyroxine (SYNTHROID, LEVOTHROID) 88 MCG tablet TAKE 1 TABLET BY MOUTH DAILY EXCEPT WEDNESDAY TAKE 1/2 TABLET 90 tablet 1  . lidocaine-prilocaine (EMLA) cream APP AA ONCE  3  . meclizine (ANTIVERT) 25 MG tablet TAKE 1/2 TABLET BY MOUTH THREE TIMES DAILY AS NEEDED 15 tablet 2  . Multiple Vitamins-Minerals (CENTRUM SILVER PO) Take 1 tablet by mouth daily.     . Multiple Vitamins-Minerals (PRESERVISION/LUTEIN) CAPS  Take 1 capsule by mouth 2 (two) times daily.     . simvastatin (ZOCOR) 40 MG tablet TAKE 1 TABLET BY MOUTH DAILY 90 tablet 1  . traMADol (ULTRAM) 50 MG tablet Take 1 tablet (50 mg total) by mouth every 6 (six) hours as needed for severe pain. 20 tablet 0   No current facility-administered  medications for this visit.     PHYSICAL EXAMINATION: ECOG PERFORMANCE STATUS: 1 - Symptomatic but completely ambulatory  Vitals:   03/20/17 1029  BP: (!) 154/54  Pulse: 64  Resp: 18  Temp: 97.7 F (36.5 C)   Filed Weights   03/20/17 1029  Weight: 114 lb 6.4 oz (51.9 kg)    GENERAL:alert, no distress and comfortable SKIN: skin color, texture, turgor are normal, no rashes or significant lesions EYES: normal, Conjunctiva are pink and non-injected, sclera clear OROPHARYNX:no exudate, no erythema and lips, buccal mucosa, and tongue normal  NECK: supple, thyroid normal size, non-tender, without nodularity LYMPH:  no palpable lymphadenopathy in the cervical, axillary or inguinal LUNGS: clear to auscultation and percussion with normal breathing effort HEART: regular rate & rhythm and no murmurs and no lower extremity edema ABDOMEN:abdomen soft, non-tender and normal bowel sounds MUSCULOSKELETAL:no cyanosis of digits and no clubbing  NEURO: alert & oriented x 3 with fluent speech, no focal motor/sensory deficits EXTREMITIES: No lower extremity edema  LABORATORY DATA:  I have reviewed the data as listed   Chemistry      Component Value Date/Time   NA 138 02/06/2017 0924   K 4.2 02/06/2017 0924   CL 102 09/15/2014 1018   CO2 28 02/06/2017 0924   BUN 19.7 02/06/2017 0924   CREATININE 0.8 02/06/2017 0924      Component Value Date/Time   CALCIUM 10.0 02/06/2017 0924   ALKPHOS 74 02/06/2017 0924   AST 25 02/06/2017 0924   ALT 21 02/06/2017 0924   BILITOT 0.44 02/06/2017 0924       Lab Results  Component Value Date   WBC 6.3 03/20/2017   HGB 12.3 03/20/2017   HCT 36.9 03/20/2017   MCV 94.7 03/20/2017   PLT 222 03/20/2017   NEUTROABS 3.7 03/20/2017    ASSESSMENT & PLAN:  Breast cancer of upper-outer quadrant of left female breast ([place]) Left breast biopsy 04/12/2016: IDC grade 2-3, left axillary LN positive for metastatic IDC; mammogram revealed a 5 cm solid mass left  breast 12:00 anterior depth. ER 0%, PR 0%, Ki-67 80%, HER-2 positive ratio 7.86, gene copy #16.5  Breast MRI: 05/16/2016:Left breast solid with cystic appearance OUQ 1:00 crossing into the medial left breast 4.7 x 3.3 x 4.2 cm; second 20m nodule left breast UOQ: Posterior intramammary/low axillary lymph node, indeterminate left axillary lymph nodes, skin thickening Bone scan and CT chest abdomen pelvis 05/10/2016: No metastatic disease Clinical stage: T2 N1 M0 stage IIB  Left modified radical mastectomy 10/18/2016: Complete response to treatment 0/9 lymph nodes negative [id number]  Treatment summary: 1. Neoadjuvant chemotherapy with Taxol Herceptin Perjeta. Taxol to be given weekly 12, Herceptin Perjeta to be given every 3 weeks 6 cycles 05/17/16 to 08/29/16 2. Mastectomy with sentinel lymph node biopsy/selective axillary node dissection 10/18/2016: Complete pathologic response 3. Decided against doing radiation given her advanced age. ------------------------------------------------------------------------------------------------------------------- Current treatment: Maintenance Herceptin Perjeta every 3 weeks to be completed end of August 2018 Toxicities: 1.Intermittent loose stools  Labs reviewed Close monitoring for toxicities to Herceptin and Perjeta Monitoring echocardiograms for toxicities every 3 months Return to clinic every 6 weeks with  labs and follow-up.  I spent 25 minutes talking to the patient of which more than half was spent in counseling and coordination of care.  No orders of the defined types were placed in this encounter.  The patient has a good understanding of the overall plan. she agrees with it. she will call with any problems that may develop before the next visit here.   Rulon Eisenmenger, MD 03/20/17

## 2017-04-10 ENCOUNTER — Ambulatory Visit (HOSPITAL_BASED_OUTPATIENT_CLINIC_OR_DEPARTMENT_OTHER): Payer: PPO

## 2017-04-10 VITALS — BP 154/60 | HR 66 | Temp 98.1°F | Resp 18

## 2017-04-10 DIAGNOSIS — Z5112 Encounter for antineoplastic immunotherapy: Secondary | ICD-10-CM | POA: Diagnosis not present

## 2017-04-10 DIAGNOSIS — C50412 Malignant neoplasm of upper-outer quadrant of left female breast: Secondary | ICD-10-CM | POA: Diagnosis not present

## 2017-04-10 DIAGNOSIS — Z171 Estrogen receptor negative status [ER-]: Principal | ICD-10-CM

## 2017-04-10 MED ORDER — PERTUZUMAB CHEMO INJECTION 420 MG/14ML
420.0000 mg | Freq: Once | INTRAVENOUS | Status: AC
  Administered 2017-04-10: 420 mg via INTRAVENOUS
  Filled 2017-04-10: qty 14

## 2017-04-10 MED ORDER — SODIUM CHLORIDE 0.9 % IV SOLN
Freq: Once | INTRAVENOUS | Status: AC
  Administered 2017-04-10: 11:00:00 via INTRAVENOUS

## 2017-04-10 MED ORDER — DIPHENHYDRAMINE HCL 50 MG/ML IJ SOLN
INTRAMUSCULAR | Status: AC
  Filled 2017-04-10: qty 1

## 2017-04-10 MED ORDER — HEPARIN SOD (PORK) LOCK FLUSH 100 UNIT/ML IV SOLN
500.0000 [IU] | Freq: Once | INTRAVENOUS | Status: AC | PRN
  Administered 2017-04-10: 500 [IU]
  Filled 2017-04-10: qty 5

## 2017-04-10 MED ORDER — ACETAMINOPHEN 325 MG PO TABS
ORAL_TABLET | ORAL | Status: AC
  Filled 2017-04-10: qty 2

## 2017-04-10 MED ORDER — SODIUM CHLORIDE 0.9% FLUSH
10.0000 mL | INTRAVENOUS | Status: DC | PRN
  Administered 2017-04-10: 10 mL
  Filled 2017-04-10: qty 10

## 2017-04-10 MED ORDER — ACETAMINOPHEN 325 MG PO TABS
650.0000 mg | ORAL_TABLET | Freq: Once | ORAL | Status: AC
  Administered 2017-04-10: 650 mg via ORAL

## 2017-04-10 MED ORDER — TRASTUZUMAB CHEMO 150 MG IV SOLR
6.0000 mg/kg | Freq: Once | INTRAVENOUS | Status: AC
  Administered 2017-04-10: 315 mg via INTRAVENOUS
  Filled 2017-04-10: qty 15

## 2017-04-10 MED ORDER — DIPHENHYDRAMINE HCL 50 MG/ML IJ SOLN
12.5000 mg | Freq: Once | INTRAMUSCULAR | Status: AC
  Administered 2017-04-10: 12.5 mg via INTRAVENOUS

## 2017-04-10 NOTE — Progress Notes (Signed)
Patient refused 53min observation due to time conflict. Vital signs stable, nocomplaints via patient.  Wylene Simmer, BSN, RN 04/10/2017 1:17 PM

## 2017-04-10 NOTE — Patient Instructions (Signed)
[facility] Discharge Instructions for Patients Receiving Chemotherapy  Today you received the following chemotherapy agents Herceptin and Perjeta.   To help prevent nausea and vomiting after your treatment, we encourage you to take your nausea medication as directed.    If you develop nausea and vomiting that is not controlled by your nausea medication, call the clinic.   BELOW ARE SYMPTOMS THAT SHOULD BE REPORTED IMMEDIATELY:  *FEVER GREATER THAN 100.5 F  *CHILLS WITH OR WITHOUT FEVER  NAUSEA AND VOMITING THAT IS NOT CONTROLLED WITH YOUR NAUSEA MEDICATION  *UNUSUAL SHORTNESS OF BREATH  *UNUSUAL BRUISING OR BLEEDING  TENDERNESS IN MOUTH AND THROAT WITH OR WITHOUT PRESENCE OF ULCERS  *URINARY PROBLEMS  *BOWEL PROBLEMS  UNUSUAL RASH Items with * indicate a potential emergency and should be followed up as soon as possible.  Feel free to call the clinic you have any questions or concerns. The clinic phone number is [phone].    

## 2017-04-17 ENCOUNTER — Encounter: Payer: Self-pay | Admitting: Internal Medicine

## 2017-04-17 DIAGNOSIS — Z853 Personal history of malignant neoplasm of breast: Secondary | ICD-10-CM | POA: Diagnosis not present

## 2017-04-17 DIAGNOSIS — R928 Other abnormal and inconclusive findings on diagnostic imaging of breast: Secondary | ICD-10-CM | POA: Diagnosis not present

## 2017-04-17 DIAGNOSIS — M8589 Other specified disorders of bone density and structure, multiple sites: Secondary | ICD-10-CM | POA: Diagnosis not present

## 2017-04-17 LAB — HM MAMMOGRAPHY

## 2017-04-24 ENCOUNTER — Encounter: Payer: Self-pay | Admitting: Internal Medicine

## 2017-04-29 ENCOUNTER — Encounter: Payer: Self-pay | Admitting: Internal Medicine

## 2017-05-01 ENCOUNTER — Encounter: Payer: Self-pay | Admitting: *Deleted

## 2017-05-01 ENCOUNTER — Ambulatory Visit: Payer: PPO

## 2017-05-01 ENCOUNTER — Ambulatory Visit (HOSPITAL_BASED_OUTPATIENT_CLINIC_OR_DEPARTMENT_OTHER): Payer: PPO | Admitting: Hematology and Oncology

## 2017-05-01 ENCOUNTER — Other Ambulatory Visit: Payer: Self-pay | Admitting: Surgery

## 2017-05-01 ENCOUNTER — Ambulatory Visit (HOSPITAL_BASED_OUTPATIENT_CLINIC_OR_DEPARTMENT_OTHER): Payer: PPO

## 2017-05-01 ENCOUNTER — Encounter: Payer: Self-pay | Admitting: Hematology and Oncology

## 2017-05-01 ENCOUNTER — Other Ambulatory Visit (HOSPITAL_BASED_OUTPATIENT_CLINIC_OR_DEPARTMENT_OTHER): Payer: PPO

## 2017-05-01 DIAGNOSIS — Z5112 Encounter for antineoplastic immunotherapy: Secondary | ICD-10-CM

## 2017-05-01 DIAGNOSIS — C773 Secondary and unspecified malignant neoplasm of axilla and upper limb lymph nodes: Secondary | ICD-10-CM

## 2017-05-01 DIAGNOSIS — C50412 Malignant neoplasm of upper-outer quadrant of left female breast: Secondary | ICD-10-CM | POA: Diagnosis not present

## 2017-05-01 DIAGNOSIS — Z95828 Presence of other vascular implants and grafts: Secondary | ICD-10-CM

## 2017-05-01 DIAGNOSIS — Z171 Estrogen receptor negative status [ER-]: Principal | ICD-10-CM

## 2017-05-01 LAB — CBC WITH DIFFERENTIAL/PLATELET
BASO%: 0.7 % (ref 0.0–2.0)
Basophils Absolute: 0 10*3/uL (ref 0.0–0.1)
EOS%: 2.8 % (ref 0.0–7.0)
Eosinophils Absolute: 0.2 10*3/uL (ref 0.0–0.5)
HCT: 35.2 % (ref 34.8–46.6)
HEMOGLOBIN: 11.8 g/dL (ref 11.6–15.9)
LYMPH#: 1.8 10*3/uL (ref 0.9–3.3)
LYMPH%: 27.1 % (ref 14.0–49.7)
MCH: 32.1 pg (ref 25.1–34.0)
MCHC: 33.6 g/dL (ref 31.5–36.0)
MCV: 95.6 fL (ref 79.5–101.0)
MONO#: 0.7 10*3/uL (ref 0.1–0.9)
MONO%: 9.7 % (ref 0.0–14.0)
NEUT#: 4 10*3/uL (ref 1.5–6.5)
NEUT%: 59.7 % (ref 38.4–76.8)
PLATELETS: 229 10*3/uL (ref 145–400)
RBC: 3.68 10*6/uL — ABNORMAL LOW (ref 3.70–5.45)
RDW: 12.8 % (ref 11.2–14.5)
WBC: 6.7 10*3/uL (ref 3.9–10.3)

## 2017-05-01 LAB — COMPREHENSIVE METABOLIC PANEL
ALBUMIN: 3.6 g/dL (ref 3.5–5.0)
ALT: 16 U/L (ref 0–55)
AST: 20 U/L (ref 5–34)
Alkaline Phosphatase: 72 U/L (ref 40–150)
Anion Gap: 6 mEq/L (ref 3–11)
BILIRUBIN TOTAL: 0.37 mg/dL (ref 0.20–1.20)
BUN: 22.1 mg/dL (ref 7.0–26.0)
CHLORIDE: 103 meq/L (ref 98–109)
CO2: 30 mEq/L — ABNORMAL HIGH (ref 22–29)
CREATININE: 0.9 mg/dL (ref 0.6–1.1)
Calcium: 10 mg/dL (ref 8.4–10.4)
EGFR: 61 mL/min/{1.73_m2} — ABNORMAL LOW (ref >90)
Glucose: 116 mg/dl (ref 70–140)
Potassium: 4.1 mEq/L (ref 3.5–5.1)
Sodium: 140 mEq/L (ref 136–145)
TOTAL PROTEIN: 7 g/dL (ref 6.4–8.3)

## 2017-05-01 MED ORDER — ACETAMINOPHEN 325 MG PO TABS
ORAL_TABLET | ORAL | Status: AC
  Filled 2017-05-01: qty 1

## 2017-05-01 MED ORDER — ACETAMINOPHEN 325 MG PO TABS
650.0000 mg | ORAL_TABLET | Freq: Once | ORAL | Status: AC
  Administered 2017-05-01: 650 mg via ORAL

## 2017-05-01 MED ORDER — TRASTUZUMAB CHEMO 150 MG IV SOLR
6.0000 mg/kg | Freq: Once | INTRAVENOUS | Status: AC
  Administered 2017-05-01: 315 mg via INTRAVENOUS
  Filled 2017-05-01: qty 15

## 2017-05-01 MED ORDER — DIPHENHYDRAMINE HCL 50 MG/ML IJ SOLN
12.5000 mg | Freq: Once | INTRAMUSCULAR | Status: AC
  Administered 2017-05-01: 12.5 mg via INTRAVENOUS

## 2017-05-01 MED ORDER — SODIUM CHLORIDE 0.9 % IV SOLN
420.0000 mg | Freq: Once | INTRAVENOUS | Status: AC
  Administered 2017-05-01: 420 mg via INTRAVENOUS
  Filled 2017-05-01: qty 14

## 2017-05-01 MED ORDER — HEPARIN SOD (PORK) LOCK FLUSH 100 UNIT/ML IV SOLN
500.0000 [IU] | Freq: Once | INTRAVENOUS | Status: AC | PRN
  Administered 2017-05-01: 500 [IU]
  Filled 2017-05-01: qty 5

## 2017-05-01 MED ORDER — SODIUM CHLORIDE 0.9 % IV SOLN
Freq: Once | INTRAVENOUS | Status: AC
  Administered 2017-05-01: 13:00:00 via INTRAVENOUS

## 2017-05-01 MED ORDER — DIPHENHYDRAMINE HCL 50 MG/ML IJ SOLN
INTRAMUSCULAR | Status: AC
  Filled 2017-05-01: qty 1

## 2017-05-01 MED ORDER — SODIUM CHLORIDE 0.9 % IJ SOLN
10.0000 mL | INTRAMUSCULAR | Status: DC | PRN
  Administered 2017-05-01: 10 mL via INTRAVENOUS
  Filled 2017-05-01: qty 10

## 2017-05-01 MED ORDER — SODIUM CHLORIDE 0.9% FLUSH
10.0000 mL | INTRAVENOUS | Status: DC | PRN
  Administered 2017-05-01: 10 mL
  Filled 2017-05-01: qty 10

## 2017-05-01 NOTE — Assessment & Plan Note (Signed)
Left breast biopsy 04/12/2016: IDC grade 2-3, left axillary LN positive for metastatic IDC; mammogram revealed a 5 cm solid mass left breast 12:00 anterior depth. ER 0%, PR 0%, Ki-67 80%, HER-2 positive ratio 7.86, gene copy #16.5  Breast MRI: 05/16/2016:Left breast solid with cystic appearance OUQ 1:00 crossing into the medial left breast 4.7 x 3.3 x 4.2 cm; second 67m nodule left breast UOQ: Posterior intramammary/low axillary lymph node, indeterminate left axillary lymph nodes, skin thickening Bone scan and CT chest abdomen pelvis 05/10/2016: No metastatic disease Clinical stage: T2 N1 M0 stage IIB  Left modified radical mastectomy 10/18/2016: Complete response to treatment 0/9 lymph nodes negative [id number]  Treatment summary: 1. Neoadjuvant chemotherapy with Taxol Herceptin Perjeta. Taxol to be given weekly 12, Herceptin Perjeta to be given every 3 weeks 6 cycles 05/17/16 to 08/29/16 2. Mastectomy with sentinel lymph node biopsy/selective axillary node dissection 10/18/2016: Complete pathologic response 3. Decided against doing radiation given her advanced age. ------------------------------------------------------------------------------------------------------------------- Current treatment: Maintenance Herceptin Perjeta every 3 weeks to be completed end of August 2018 Toxicities: 1.Intermittent loose stools  Labs reviewed This concludes her treatment plan. She will now go on surveillance. Return to clinic in 6 months for surveillance checks and follow-up

## 2017-05-01 NOTE — Progress Notes (Signed)
Patient Care Team: [name], MD as PCP - General (Internal Medicine)  DIAGNOSIS:  Encounter Diagnosis  Name Primary?  . Malignant neoplasm of upper-outer quadrant of left breast in female, estrogen receptor negative ([place])     SUMMARY OF ONCOLOGIC HISTORY:   Breast cancer of upper-outer quadrant of left female breast ([place])   04/12/2016 Initial Diagnosis    Left breast biopsy: IDC grade 2-3, left axillary LN positive for metastatic IDC; mammogram revealed a 5 cm solid mass left breast 12:00 anterior depth, ER 0%, PR 0%, Ki-67 80%, HER-2 positive ratio 7.6, copy #16.5      05/10/2016 Imaging    Bone scan normal; CT CAP: left breast cancer, left axillary and subpectoral nodes are not pathologic by size criteriabut suspicious, no metastatic disease      05/16/2016 Breast MRI    Left breast solid with cystic appearance OUQ 1:00 crossing into the medial left breast 4.7 x 3.3 x 4.2 cm; second 52m nodule left breast UOQ: Posterior intramammary/low axillary lymph node, indeterminate left axillary lymph nodes, skin thickening      05/17/2016 - 08/29/2016 Neo-Adjuvant Chemotherapy    Taxol weekly 12 Herceptin and Perjeta every 3 weeks 6      09/05/2016 Breast MRI    Breast MRI: Complete Imaging response      10/18/2016 Surgery    Left modified radical mastectomy: Complete response to treatment 0/9 lymph nodes negative [id number]       CHIEF COMPLIANT: Herceptin and Perjeta maintenance, last cycle  INTERVAL HISTORY: GCIDNEY KIRKWOODis a 83year old with above-mentioned history of breast cancer is currently on Herceptin and Perjeta maintenance. Today is the last cycle of treatment. She is finally completed her 1 year of maintenance therapy. Continues to have diarrhea intermittently but she is able to control it very well.   REVIEW OF SYSTEMS:   Constitutional: Denies fevers, chills or abnormal weight loss Eyes: Denies blurriness of vision Ears, nose, mouth, throat, and face: Denies  mucositis or sore throat Respiratory: Denies cough, dyspnea or wheezes Cardiovascular: Denies palpitation, chest discomfort Gastrointestinal:  Diarrhea Skin: Denies abnormal skin rashes Lymphatics: Denies new lymphadenopathy or easy bruising Neurological:Denies numbness, tingling or new weaknesses Behavioral/Psych: Mood is stable, no new changes  Extremities: No lower extremity edema Breast:  denies any pain or lumps or nodules in either breasts All other systems were reviewed with the patient and are negative.  I have reviewed the past medical history, past surgical history, social history and family history with the patient and they are unchanged from previous note.  ALLERGIES:  is allergic to other.  MEDICATIONS:  Current Outpatient Prescriptions  Medication Sig Dispense Refill  . amLODipine (NORVASC) 5 MG tablet Take 1 tablet (5 mg total) by mouth daily. 180 tablet 3  . Calcium Carb-Cholecalciferol (CALCIUM 600+D3 PO) Take 1 tablet by mouth 2 (two) times daily.    . Cholecalciferol (VITAMIN D3) 1000 UNITS CAPS Take 1,000 Units by mouth daily.     .Marland Kitchenlevothyroxine (SYNTHROID, LEVOTHROID) 88 MCG tablet TAKE 1 TABLET BY MOUTH DAILY EXCEPT WEDNESDAY TAKE 1/2 TABLET 90 tablet 1  . lidocaine-prilocaine (EMLA) cream APP AA ONCE  3  . meclizine (ANTIVERT) 25 MG tablet TAKE 1/2 TABLET BY MOUTH THREE TIMES DAILY AS NEEDED 15 tablet 2  . Multiple Vitamins-Minerals (CENTRUM SILVER PO) Take 1 tablet by mouth daily.     . Multiple Vitamins-Minerals (PRESERVISION/LUTEIN) CAPS Take 1 capsule by mouth 2 (two) times daily.     .Marland Kitchen  simvastatin (ZOCOR) 40 MG tablet TAKE 1 TABLET BY MOUTH DAILY 90 tablet 1   No current facility-administered medications for this visit.     PHYSICAL EXAMINATION: ECOG PERFORMANCE STATUS: 1 - Symptomatic but completely ambulatory  Vitals:   05/01/17 1038  BP: (!) 142/57  Pulse: 61  Resp: 18  Temp: 97.7 F (36.5 C)  SpO2: 98%   Filed Weights   05/01/17 1038    Weight: 114 lb 14.4 oz (52.1 kg)    GENERAL:alert, no distress and comfortable SKIN: skin color, texture, turgor are normal, no rashes or significant lesions EYES: normal, Conjunctiva are pink and non-injected, sclera clear OROPHARYNX:no exudate, no erythema and lips, buccal mucosa, and tongue normal  NECK: supple, thyroid normal size, non-tender, without nodularity LYMPH:  no palpable lymphadenopathy in the cervical, axillary or inguinal LUNGS: clear to auscultation and percussion with normal breathing effort HEART: regular rate & rhythm and no murmurs and no lower extremity edema ABDOMEN:abdomen soft, non-tender and normal bowel sounds MUSCULOSKELETAL:no cyanosis of digits and no clubbing  NEURO: alert & oriented x 3 with fluent speech, no focal motor/sensory deficits EXTREMITIES: No lower extremity edema BREAST: No palpable masses or nodules in either right or left breasts. No palpable axillary supraclavicular or infraclavicular adenopathy no breast tenderness or nipple discharge. (exam performed in the presence of a chaperone)  LABORATORY DATA:  I have reviewed the data as listed   Chemistry      Component Value Date/Time   NA 140 05/01/2017 1011   K 4.1 05/01/2017 1011   CL 102 09/15/2014 1018   CO2 30 (H) 05/01/2017 1011   BUN 22.1 05/01/2017 1011   CREATININE 0.9 05/01/2017 1011      Component Value Date/Time   CALCIUM 10.0 05/01/2017 1011   ALKPHOS 72 05/01/2017 1011   AST 20 05/01/2017 1011   ALT 16 05/01/2017 1011   BILITOT 0.37 05/01/2017 1011       Lab Results  Component Value Date   WBC 6.7 05/01/2017   HGB 11.8 05/01/2017   HCT 35.2 05/01/2017   MCV 95.6 05/01/2017   PLT 229 05/01/2017   NEUTROABS 4.0 05/01/2017    ASSESSMENT & PLAN:  Breast cancer of upper-outer quadrant of left female breast ([place]) Left breast biopsy 04/12/2016: IDC grade 2-3, left axillary LN positive for metastatic IDC; mammogram revealed a 5 cm solid mass left breast 12:00  anterior depth. ER 0%, PR 0%, Ki-67 80%, HER-2 positive ratio 7.86, gene copy #16.5  Breast MRI: 05/16/2016:Left breast solid with cystic appearance OUQ 1:00 crossing into the medial left breast 4.7 x 3.3 x 4.2 cm; second 34m nodule left breast UOQ: Posterior intramammary/low axillary lymph node, indeterminate left axillary lymph nodes, skin thickening Bone scan and CT chest abdomen pelvis 05/10/2016: No metastatic disease Clinical stage: T2 N1 M0 stage IIB  Left modified radical mastectomy 10/18/2016: Complete response to treatment 0/9 lymph nodes negative [id number]  Treatment summary: 1. Neoadjuvant chemotherapy with Taxol Herceptin Perjeta. Taxol to be given weekly 12, Herceptin Perjeta to be given every 3 weeks 6 cycles 05/17/16 to 08/29/16 2. Mastectomy with sentinel lymph node biopsy/selective axillary node dissection 10/18/2016: Complete pathologic response 3. Decided against doing radiation given her advanced age. ------------------------------------------------------------------------------------------------------------------- Current treatment: Maintenance Herceptin Perjeta every 3 weeks to be completed end of August 2018 Toxicities: 1.Intermittent loose stools  Labs reviewed This concludes her treatment plan. She will now go on surveillance. Return to clinic in 6 months for surveillance checks and follow-up   I  spent 25 minutes talking to the patient of which more than half was spent in counseling and coordination of care.  No orders of the defined types were placed in this encounter.  The patient has a good understanding of the overall plan. she agrees with it. she will call with any problems that may develop before the next visit here.   Rulon Eisenmenger, MD 05/01/17

## 2017-05-01 NOTE — Patient Instructions (Signed)
[facility] Discharge Instructions for Patients Receiving Chemotherapy  Today you received the following chemotherapy agents Herceptin and Perjeta.   To help prevent nausea and vomiting after your treatment, we encourage you to take your nausea medication as directed.    If you develop nausea and vomiting that is not controlled by your nausea medication, call the clinic.   BELOW ARE SYMPTOMS THAT SHOULD BE REPORTED IMMEDIATELY:  *FEVER GREATER THAN 100.5 F  *CHILLS WITH OR WITHOUT FEVER  NAUSEA AND VOMITING THAT IS NOT CONTROLLED WITH YOUR NAUSEA MEDICATION  *UNUSUAL SHORTNESS OF BREATH  *UNUSUAL BRUISING OR BLEEDING  TENDERNESS IN MOUTH AND THROAT WITH OR WITHOUT PRESENCE OF ULCERS  *URINARY PROBLEMS  *BOWEL PROBLEMS  UNUSUAL RASH Items with * indicate a potential emergency and should be followed up as soon as possible.  Feel free to call the clinic you have any questions or concerns. The clinic phone number is [phone].    

## 2017-05-18 DIAGNOSIS — C50912 Malignant neoplasm of unspecified site of left female breast: Secondary | ICD-10-CM | POA: Diagnosis not present

## 2017-05-21 DIAGNOSIS — Z853 Personal history of malignant neoplasm of breast: Secondary | ICD-10-CM | POA: Diagnosis not present

## 2017-05-21 DIAGNOSIS — Z452 Encounter for adjustment and management of vascular access device: Secondary | ICD-10-CM | POA: Diagnosis not present

## 2017-05-25 ENCOUNTER — Ambulatory Visit (HOSPITAL_COMMUNITY): Admission: RE | Admit: 2017-05-25 | Payer: PPO | Source: Ambulatory Visit

## 2017-05-25 ENCOUNTER — Encounter (HOSPITAL_COMMUNITY): Payer: PPO | Admitting: Internal Medicine

## 2017-05-31 ENCOUNTER — Ambulatory Visit (HOSPITAL_BASED_OUTPATIENT_CLINIC_OR_DEPARTMENT_OTHER)
Admission: RE | Admit: 2017-05-31 | Discharge: 2017-05-31 | Disposition: A | Discharge status: Home / Self Care | Payer: PPO | Source: Ambulatory Visit | Attending: Internal Medicine | Admitting: Internal Medicine

## 2017-05-31 ENCOUNTER — Ambulatory Visit (HOSPITAL_COMMUNITY)
Admission: RE | Admit: 2017-05-31 | Discharge: 2017-05-31 | Disposition: A | Discharge status: Home / Self Care | Payer: PPO | Source: Ambulatory Visit | Attending: Internal Medicine | Admitting: Internal Medicine

## 2017-05-31 VITALS — BP 176/84 | HR 65 | Wt 116.8 lb

## 2017-05-31 DIAGNOSIS — I081 Rheumatic disorders of both mitral and tricuspid valves: Secondary | ICD-10-CM | POA: Diagnosis not present

## 2017-05-31 DIAGNOSIS — I10 Essential (primary) hypertension: Secondary | ICD-10-CM

## 2017-05-31 DIAGNOSIS — E785 Hyperlipidemia, unspecified: Secondary | ICD-10-CM | POA: Diagnosis not present

## 2017-05-31 DIAGNOSIS — Z9221 Personal history of antineoplastic chemotherapy: Secondary | ICD-10-CM | POA: Diagnosis not present

## 2017-05-31 DIAGNOSIS — I340 Nonrheumatic mitral (valve) insufficiency: Secondary | ICD-10-CM | POA: Diagnosis not present

## 2017-05-31 DIAGNOSIS — C50412 Malignant neoplasm of upper-outer quadrant of left female breast: Secondary | ICD-10-CM | POA: Diagnosis not present

## 2017-05-31 NOTE — Progress Notes (Signed)
  Echocardiogram 2D Echocardiogram has been performed.  Cainan Trull T Mostyn Varnell 05/31/2017, 3:09 PM

## 2017-05-31 NOTE — Patient Instructions (Signed)
Your physician recommends that you schedule a follow-up appointment in: As needed  

## 2017-05-31 NOTE — Progress Notes (Signed)
CARDIO-ONCOLOGY CLINIC NOTE  Referring Physician: Lindi Adie Primary Cardiologist: Maxi Carreras   HPI:  Jessica Sherman 83 y.o. female with h/o hyperlipidemia, hypothyroidism with no known h/o heart disease referred by Dr. Lindi Adie for enrollment into the cardio-oncology program due to left breast cancer.  Diagnosed with left breast cancer 8/17.  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 LN positive for metastatic disease.    SUMMARY OF ONCOLOGIC HISTORY:       Breast cancer of upper-outer quadrant of left female breast ([place])   04/12/2016 Initial Diagnosis    Left breast biopsy: IDC grade 2-3, left axillary LN positive for metastatic IDC; mammogram revealed a 5 cm solid mass left breast 12:00 anterior depth      Returns for f/u. Feeling great. Denies dyspnea, orthopnea or PND. Has completed Herceptin/perjeta. Port out.   ECHO 8/17: EF 55-60%. Grade I DD. Mild TR/MR. LS' 11.5 GLS -20.0% ECHO 11/1: EF 55-60%. Grade I DD. Mild TR/MR. LS' 11.0 GLS -18.3% ECHO 11/20/16: EF 55-60%. Grade I DD. Mild TR/MR. LS' 11.1 GLS -20.1% ECHO 05/31/17: EF 55-60%. Grade I DD. Mild-mod TR/ mildMR. LS' 9.4 GLS -17.8%   Past Medical History:  Diagnosis Date  . Hx of colonic polyps   . Hyperlipidemia   . Hypothyroidism   . Macular degeneration, dry   . Osteopenia    spine BMD T score - 1.69  . PONV (postoperative nausea and vomiting)    pt has vertigo and can not lie flat  . Prediabetes    FBS 136  on 08/29/2010; A1c 6.2%  . Vertigo     Current Outpatient Prescriptions  Medication Sig Dispense Refill  . amLODipine (NORVASC) 5 MG tablet Take 1 tablet (5 mg total) by mouth daily. 180 tablet 3  . Calcium Carb-Cholecalciferol (CALCIUM 600+D3 PO) Take 1 tablet by mouth 2 (two) times daily.    . Cholecalciferol (VITAMIN D3) 1000 UNITS CAPS  Take 1,000 Units by mouth daily.     Marland Kitchen levothyroxine (SYNTHROID, LEVOTHROID) 88 MCG tablet TAKE 1 TABLET BY MOUTH DAILY EXCEPT WEDNESDAY TAKE 1/2 TABLET 90 tablet 1  . lidocaine-prilocaine (EMLA) cream APP AA ONCE  3  . meclizine (ANTIVERT) 25 MG tablet TAKE 1/2 TABLET BY MOUTH THREE TIMES DAILY AS NEEDED 15 tablet 2  . Multiple Vitamins-Minerals (CENTRUM SILVER PO) Take 1 tablet by mouth daily.     . Multiple Vitamins-Minerals (PRESERVISION/LUTEIN) CAPS Take 1 capsule by mouth 2 (two) times daily.     . simvastatin (ZOCOR) 40 MG tablet TAKE 1 TABLET BY MOUTH DAILY 90 tablet 1   No current facility-administered medications for this encounter.     Allergies  Allergen Reactions  . Other Nausea And Vomiting    Anesthesia "makes me very sick", Please pre-medicate to control nausea and vomiting      Social History   Social History  . Marital status: Widowed    Spouse name: N/A  . Number of children: N/A  . Years of education: N/A   Occupational History  . retired    Social History Main Topics  . Smoking status: Never Smoker  . Smokeless tobacco: Never Used  . Alcohol use No     Comment:    . Drug use: No  . Sexual activity: Not on file   Other Topics Concern  . Not  on file   Social History Narrative   Regular exercise- yes 6md w/o symptoms      Family History  Problem Relation Age of Onset  . Osteoporosis Mother   . Leukemia Mother   . Cancer Maternal Uncle        ? stomach  . Aneurysm Father        AAA  . Heart attack Neg Hx     Vitals:   05/31/17 1512  BP: (!) 176/84  Pulse: 65  SpO2: 96%  Weight: 116 lb 12 oz (53 kg)    PHYSICAL EXAM: General:  Well appearing. No resp difficulty HEENT: normal Neck: supple. JVP 6-7 Carotids 2+ bilat; no bruits. No lymphadenopathy or thryomegaly appreciated. Cor: PMI nondisplaced. Regular rate & rhythm. No rubs, gallops or murmurs. Lungs: clear Abdomen: soft, nontender, nondistended. No hepatosplenomegaly. No bruits  or masses. Good bowel sounds. Extremities: no cyanosis, clubbing, rash, edema Neuro: alert & orientedx3, cranial nerves grossly intact. moves all 4 extremities w/o difficulty. Affect pleasant   ASSESSMENT & PLAN: 1. Breast cancer of upper-outer quadrant of left female breast ([place] Left breast biopsy 04/12/2016: IDC grade 2-3, left axillary LN positive for metastatic IDC; mammogram revealed a 5 cm solid mass left breast 12:00 anterior depth. S/p mastectomy in 2/18  - has completed her therapy.  - I reviewed echos personally. EF and Doppler parameters stable. No HF on exam. Can f/u PRN  2. Hypertension - BP high here but has been [id number] range at Oncology visits. She will need continued f/u with PCP. Can increase amlodipine to 10 daily as needed.    Gerhard Rappaport,MD 4:03 PM

## 2017-06-25 ENCOUNTER — Other Ambulatory Visit: Payer: Self-pay | Admitting: Internal Medicine

## 2017-06-27 ENCOUNTER — Telehealth: Payer: Self-pay

## 2017-06-27 NOTE — Telephone Encounter (Signed)
Left message with patient's daughter about appt as well.

## 2017-06-27 NOTE — Telephone Encounter (Signed)
Spoke with patient about upcoming SCP visit on 10/23 at 2 pm.  Patient said she appreciated call and she would come to appt.

## 2017-07-03 ENCOUNTER — Ambulatory Visit (HOSPITAL_BASED_OUTPATIENT_CLINIC_OR_DEPARTMENT_OTHER): Payer: PPO | Admitting: Adult Health

## 2017-07-03 VITALS — BP 154/55 | HR 62 | Temp 97.8°F | Resp 18 | Ht 63.0 in | Wt 115.9 lb

## 2017-07-03 DIAGNOSIS — Z853 Personal history of malignant neoplasm of breast: Secondary | ICD-10-CM | POA: Diagnosis not present

## 2017-07-03 DIAGNOSIS — C50412 Malignant neoplasm of upper-outer quadrant of left female breast: Secondary | ICD-10-CM

## 2017-07-03 DIAGNOSIS — Z171 Estrogen receptor negative status [ER-]: Principal | ICD-10-CM

## 2017-07-03 NOTE — Progress Notes (Signed)
CLINIC:  Survivorship   REASON FOR VISIT:  Routine follow-up post-treatment for a recent history of breast cancer.  BRIEF ONCOLOGIC HISTORY:    Breast cancer of upper-outer quadrant of left female breast ([name])   04/12/2016 Initial Diagnosis    Left breast biopsy: IDC grade 2-3, left axillary LN positive for metastatic IDC; mammogram revealed a 5 cm solid mass left breast 12:00 anterior depth, ER 0%, PR 0%, Ki-67 80%, HER-2 positive ratio 7.6, copy #16.5      05/10/2016 Imaging    Bone scan normal; CT CAP: left breast cancer, left axillary and subpectoral nodes are not pathologic by size criteriabut suspicious, no metastatic disease      05/16/2016 Breast MRI    Left breast solid with cystic appearance OUQ 1:00 crossing into the medial left breast 4.7 x 3.3 x 4.2 cm; second 71m nodule left breast UOQ: Posterior intramammary/low axillary lymph node, indeterminate left axillary lymph nodes, skin thickening      05/17/2016 - 08/29/2016 Neo-Adjuvant Chemotherapy    Taxol weekly 12 Herceptin and Perjeta every 3 weeks 6      09/05/2016 Breast MRI    Breast MRI: Complete Imaging response      10/18/2016 Surgery    Left modified radical mastectomy: Complete response to treatment 0/9 lymph nodes negative [id number]        INTERVAL HISTORY:  Ms. CMilsappresents to the Survivorship Clinic today for our initial meeting to review her survivorship care plan detailing her treatment course for breast cancer, as well as monitoring long-term side effects of that treatment, education regarding health maintenance, screening, and overall wellness and health promotion.     Overall, Ms. CSweatmanreports feeling quite well since having her mastectomy and undergoing her port removal.  She does have occasional diarrhea from time to time.  She takes imodium for this.  Her treatment has however, weakened her teeth.  She lost several teeth and required dental work yesterday.      REVIEW OF SYSTEMS:  Review  of Systems  Constitutional: Negative for appetite change, chills, fatigue, fever and unexpected weight change.  HENT:   Negative for hearing loss and lump/mass.   Eyes: Negative for eye problems and icterus.  Respiratory: Negative for chest tightness, cough and shortness of breath.   Cardiovascular: Negative for chest pain, leg swelling and palpitations.  Gastrointestinal: Negative for abdominal distention, abdominal pain, constipation, diarrhea, nausea and vomiting.  Endocrine: Negative for hot flashes.  Musculoskeletal: Negative for arthralgias.  Skin: Negative for itching and rash.  Neurological: Negative for dizziness, extremity weakness, headaches and numbness.  Hematological: Negative for adenopathy. Does not bruise/bleed easily.  Psychiatric/Behavioral: Negative for depression. The patient is not nervous/anxious.   Breast: Denies any new nodularity, masses, tenderness, nipple changes, or nipple discharge.      ONCOLOGY TREATMENT TEAM:  1. Sur[facility]  [facility] 2. Medical Oncologist: Dr. GLindi Adie   PAST MEDICAL/SURGICAL HISTORY:  Past Medical History:  Diagnosis Date  . Hx of colonic polyps   . Hyperlipidemia   . Hypothyroidism   . Macular degeneration, dry   . Osteopenia    spine BMD T score - 1.69  . PONV (postoperative nausea and vomiting)    pt has vertigo and can not lie flat  . Prediabetes    FBS 136  on 08/29/2010; A1c 6.2%  . Vertigo    Past Surgical History:  Procedure Laterality Date  . CATARACT EXTRACTION  2009   OS  .  COLONOSCOPY W/ POLYPECTOMY     X 2; last 2012. Dr Jessica Sherman  . IR GENERIC HISTORICAL  05/09/2016   IR US GUIDE VASC ACCESS RIGHT 05/09/2016 Jessica Cadet, MD [facility]  . IR GENERIC HISTORICAL  05/09/2016   IR FLUORO GUIDE CV LINE RIGHT 05/09/2016 Jessica Cadet, MD [facility]  . MODIFIED MASTECTOMY Left 10/18/2016   Procedure: LEFT MODIFIED RADICAL MASTECTOMY;  Surgeon: [name], MD;  Location:  [facility];  Service: General;  Laterality: Left;  . PILONIDAL CYST EXCISION     X 3  . TONSILLECTOMY AND ADENOIDECTOMY    . TOTAL ABDOMINAL HYSTERECTOMY     w/ bladder repair     ALLERGIES:  Allergies  Allergen Reactions  . Other Nausea And Vomiting    Anesthesia "makes me very sick", Please pre-medicate to control nausea and vomiting     CURRENT MEDICATIONS:  Outpatient Encounter Prescriptions as of 07/03/2017  Medication Sig  . amLODipine (NORVASC) 5 MG tablet Take 1 tablet (5 mg total) by mouth daily.  . Calcium Carb-Cholecalciferol (CALCIUM 600+D3 PO) Take 1 tablet by mouth 2 (two) times daily.  . Cholecalciferol (VITAMIN D3) 1000 UNITS CAPS Take 1,000 Units by mouth daily.   Marland Kitchen levothyroxine (SYNTHROID, LEVOTHROID) 88 MCG tablet TAKE 1 TABLET BY MOUTH DAILY EXCEPT WEDNESDAY TAKE 1/2 TABLET  . lidocaine-prilocaine (EMLA) cream APP AA ONCE  . meclizine (ANTIVERT) 25 MG tablet TAKE 1/2 TABLET BY MOUTH THREE TIMES DAILY AS NEEDED  . Multiple Vitamins-Minerals (CENTRUM SILVER PO) Take 1 tablet by mouth daily.   . Multiple Vitamins-Minerals (PRESERVISION/LUTEIN) CAPS Take 1 capsule by mouth 2 (two) times daily.   . simvastatin (ZOCOR) 40 MG tablet TAKE 1 TABLET BY MOUTH DAILY   No facility-administered encounter medications on file as of 07/03/2017.      ONCOLOGIC FAMILY HISTORY:  Family History  Problem Relation Age of Onset  . Osteoporosis Mother   . Leukemia Mother   . Cancer Maternal Uncle        ? stomach  . Aneurysm Father        AAA  . Heart attack Neg Hx        SOCIAL HISTORY:  Jessica Sherman is widowed and she lives alone in [place], [place].  She has 4 children and they live in [place] as well.  Jessica Sherman is currently retired.  She denies any current or history of tobacco, alcohol, or illicit drug use.     PHYSICAL EXAMINATION:  Vital Signs:   Vitals:   07/03/17 1344  BP: (!) 154/55  Pulse: 62  Resp: 18  Temp:  97.8 F (36.6 C)  SpO2: 100%   Filed Weights   07/03/17 1344  Weight: 115 lb 14.4 oz (52.6 kg)   General: Well-nourished, well-appearing female in no acute distress.  She is unaccompanied today.   HEENT: Head is normocephalic.  Pupils equal and reactive to light. Conjunctivae clear without exudate.  Sclerae anicteric. Oral mucosa is pink, moist.  Oropharynx is pink without lesions or erythema.  Lymph: No cervical, supraclavicular, or infraclavicular lymphadenopathy noted on palpation.  Cardiovascular: Regular rate and rhythm.Marland Kitchen Respiratory: Clear to auscultation bilaterally. Chest expansion symmetric; breathing non-labored.  GI: Abdomen soft and round; non-tender, non-distended. Bowel sounds normoactive.  GU: Deferred.  Neuro: No focal deficits. Steady gait.  Psych: Mood and affect normal and appropriate for situation.  Extremities: No edema. MSK: No focal spinal tenderness to palpation.  Full range of motion in bilateral upper extremities  Skin: Warm and dry.  LABORATORY DATA:  None for this visit.  DIAGNOSTIC IMAGING:  None for this visit.      ASSESSMENT AND PLAN:  Ms.. Sherman is a pleasant 83 y.o. female with Stage IIB left breast invasive ductal carcinoma, ER-/PR-/HER2+, diagnosed in 04/2016, treated with neo adjuvant chemotherapy, mastectomy, adjuvant radiation therapy, and maintenance Trastuzumab/Pertuzumab.  She presents to the Survivorship Clinic for our initial meeting and routine follow-up post-completion of treatment for breast cancer.    1. Stage IIB left breast cancer:  Jessica Sherman is continuing to recover from definitive treatment for breast cancer. She will follow-up with her medical oncologist, Dr. Lindi Adie 10/2017 with history and physical exam per surveillance protocol.  Today, a comprehensive survivorship care plan and treatment summary was reviewed with the patient today detailing her breast cancer diagnosis, treatment course, potential late/long-term effects of  treatment, appropriate follow-up care with recommendations for the future, and patient education resources.  A copy of this summary, along with a letter will be sent to the patient's primary care provider via mail/fax/In Basket message after today's visit.    2. Bone health:  Given Jessica Sherman's age/history of breast cance she is at risk for bone demineralization.  I will defer to her PCP regarding bone density testing and management.  In the meantime, she was encouraged to increase her consumption of foods rich in calcium, as well as increase her weight-bearing activities.  She was given education on specific activities to promote bone health.  3. Cancer screening:  Due to Jessica Sherman's history and her age, she should receive screening for skin cancers, colon cancer, and gynecologic cancers.  The information and recommendations are listed on the patient's comprehensive care plan/treatment summary and were reviewed in detail with the patient.    4. Health maintenance and wellness promotion: Jessica Sherman was encouraged to consume 5-7 servings of fruits and vegetables per day. We reviewed the "Nutrition Rainbow" handout, as well as the handout "Take Control of Your Health and Reduce Your Cancer Risk" from the [place].  She was also encouraged to engage in moderate to vigorous exercise for 30 minutes per day most days of the week. We discussed the [employer] fitness program, which is designed for cancer survivors to help them become more physically fit after cancer treatments.  She was instructed to limit her alcohol consumption and continue to abstain from tobacco use.     5. Support services/counseling: It is not uncommon for this period of the patient's cancer care trajectory to be one of many emotions and stressors.  We discussed an opportunity for her to participate in the next session of [facility] ("Finding Your New Normal") support group series designed for patients after they have  completed treatment.   Jessica Sherman was encouraged to take advantage of our many other support services programs, support groups, and/or counseling in coping with her new life as a cancer survivor after completing anti-cancer treatment.  She was offered support today through active listening and expressive supportive counseling.  She was given information regarding our available services and encouraged to contact me with any questions or for help enrolling in any of our support group/programs.    Dispo:   -Return to cancer center for follow up with Dr. Lindi Adie in 10/2017 -Right breast screening mammogram due in 04/2018 -Follow up with surgery per Dr. Ninfa Linden -She is welcome to return back to the Survivorship Clinic at any time; no additional follow-up needed at this time.  -  Consider referral back to survivorship as a long-term survivor for continued surveillance  A total of (30) minutes of face-to-face time was spent with this patient with greater than 50% of that time in counseling and care-coordination.   Gardenia Phlegm, NP Survivorship Program [facility] [phone]   Note: PRIMARY CARE PROVIDER [name], [place] [phone]

## 2017-07-04 ENCOUNTER — Encounter: Payer: Self-pay | Admitting: Adult Health

## 2017-07-04 ENCOUNTER — Telehealth: Payer: Self-pay | Admitting: Adult Health

## 2017-07-04 NOTE — Telephone Encounter (Signed)
Per 10/23 - no los at checkout °

## 2017-07-31 ENCOUNTER — Other Ambulatory Visit (HOSPITAL_COMMUNITY): Payer: Self-pay | Admitting: Internal Medicine

## 2017-07-31 DIAGNOSIS — I10 Essential (primary) hypertension: Secondary | ICD-10-CM

## 2017-08-28 ENCOUNTER — Telehealth: Payer: Self-pay | Admitting: Internal Medicine

## 2017-08-28 NOTE — Telephone Encounter (Signed)
Spoke with Mrs. Jessica Sherman regarding AWV. Pt declined to schedule appointment at this time. SF

## 2017-09-25 ENCOUNTER — Other Ambulatory Visit: Payer: Self-pay | Admitting: *Deleted

## 2017-09-25 NOTE — Patient Outreach (Signed)
Initial HTA High Risk screening call attempted without success, however I was able to leave a message and requested a return call.  Eulah Pont. Myrtie Neither, MSN, [facility] Gerontological Nurse Practitioner [facility] Care Management [phone]

## 2017-09-27 ENCOUNTER — Encounter: Payer: Self-pay | Admitting: *Deleted

## 2017-09-27 ENCOUNTER — Other Ambulatory Visit: Payer: Self-pay | Admitting: *Deleted

## 2017-09-27 NOTE — Patient Outreach (Signed)
HTA High Risk pt screening call. I was able to speak with Jessica Sherman for a brief time. She is doing very well. Continues to be active. Has a gentleman friend that accompanies her to many community activities. She did not have time to do the full screening but I advised her that I would send the information for her future reference.  Jessica Sherman. Jessica Neither, MSN, [facility] Gerontological Nurse Practitioner [facility] Care Management [phone]

## 2017-10-12 ENCOUNTER — Other Ambulatory Visit: Payer: Self-pay | Admitting: Internal Medicine

## 2017-11-01 ENCOUNTER — Telehealth: Payer: Self-pay | Admitting: Hematology and Oncology

## 2017-11-01 ENCOUNTER — Inpatient Hospital Stay: Payer: PPO | Attending: Hematology and Oncology | Admitting: Hematology and Oncology

## 2017-11-01 ENCOUNTER — Inpatient Hospital Stay: Payer: PPO

## 2017-11-01 DIAGNOSIS — Z1501 Genetic susceptibility to malignant neoplasm of breast: Secondary | ICD-10-CM | POA: Insufficient documentation

## 2017-11-01 DIAGNOSIS — C50412 Malignant neoplasm of upper-outer quadrant of left female breast: Secondary | ICD-10-CM | POA: Diagnosis not present

## 2017-11-01 DIAGNOSIS — Z9221 Personal history of antineoplastic chemotherapy: Secondary | ICD-10-CM

## 2017-11-01 DIAGNOSIS — Z171 Estrogen receptor negative status [ER-]: Secondary | ICD-10-CM

## 2017-11-01 DIAGNOSIS — C779 Secondary and unspecified malignant neoplasm of lymph node, unspecified: Secondary | ICD-10-CM | POA: Diagnosis not present

## 2017-11-01 DIAGNOSIS — Z9012 Acquired absence of left breast and nipple: Secondary | ICD-10-CM | POA: Insufficient documentation

## 2017-11-01 LAB — COMPREHENSIVE METABOLIC PANEL
ALBUMIN: 3.6 g/dL (ref 3.5–5.0)
ALT: 19 U/L (ref 0–55)
ANION GAP: 8 (ref 3–11)
AST: 20 U/L (ref 5–34)
Alkaline Phosphatase: 72 U/L (ref 40–150)
BUN: 21 mg/dL (ref 7–26)
CHLORIDE: 101 mmol/L (ref 98–109)
CO2: 29 mmol/L (ref 22–29)
Calcium: 10.1 mg/dL (ref 8.4–10.4)
Creatinine, Ser: 0.84 mg/dL (ref 0.60–1.10)
GFR calc Af Amer: >60 mL/min (ref >60)
GFR calc non Af Amer: >60 mL/min (ref >60)
GLUCOSE: 85 mg/dL (ref 70–140)
Potassium: 4.4 mmol/L (ref 3.5–5.1)
SODIUM: 138 mmol/L (ref 136–145)
Total Bilirubin: 0.3 mg/dL (ref 0.2–1.2)
Total Protein: 7.3 g/dL (ref 6.4–8.3)

## 2017-11-01 LAB — CBC WITH DIFFERENTIAL/PLATELET
BASOS PCT: 1 %
Basophils Absolute: 0.1 10*3/uL (ref 0.0–0.1)
Eosinophils Absolute: 0.1 10*3/uL (ref 0.0–0.5)
Eosinophils Relative: 1 %
HEMATOCRIT: 36.7 % (ref 34.8–46.6)
HEMOGLOBIN: 12.1 g/dL (ref 11.6–15.9)
LYMPHS ABS: 1.8 10*3/uL (ref 0.9–3.3)
Lymphocytes Relative: 24 %
MCH: 31.5 pg (ref 25.1–34.0)
MCHC: 33 g/dL (ref 31.5–36.0)
MCV: 95.6 fL (ref 79.5–101.0)
MONO ABS: 0.8 10*3/uL (ref 0.1–0.9)
MONOS PCT: 11 %
NEUTROS ABS: 4.6 10*3/uL (ref 1.5–6.5)
NEUTROS PCT: 63 %
Platelets: 265 10*3/uL (ref 145–400)
RBC: 3.84 MIL/uL (ref 3.70–5.45)
RDW: 13.2 % (ref 11.2–14.5)
WBC: 7.3 10*3/uL (ref 3.9–10.3)

## 2017-11-01 NOTE — Progress Notes (Signed)
Patient Care Team: [name], MD as PCP - General (Internal Medicine) [name], Charlestine Massed, NP as Nurse Practitioner (Hematology and Oncology) [name], MD as Consulting Physician (Hematology and Oncology) Bensimhon, Shaune Pascal, MD as Consulting Physician (Cardiology) [name], MD as Consulting Physician (General Surgery)  DIAGNOSIS:  Encounter Diagnosis  Name Primary?  . Malignant neoplasm of upper-outer quadrant of left breast in female, estrogen receptor negative ([place])     SUMMARY OF ONCOLOGIC HISTORY:   Breast cancer of upper-outer quadrant of left female breast ([place])   04/12/2016 Initial Diagnosis    Left breast biopsy: IDC grade 2-3, left axillary LN positive for metastatic IDC; mammogram revealed a 5 cm solid mass left breast 12:00 anterior depth, ER 0%, PR 0%, Ki-67 80%, HER-2 positive ratio 7.6, copy #16.5      05/10/2016 Imaging    Bone scan normal; CT CAP: left breast cancer, left axillary and subpectoral nodes are not pathologic by size criteriabut suspicious, no metastatic disease      05/16/2016 Breast MRI    Left breast solid with cystic appearance OUQ 1:00 crossing into the medial left breast 4.7 x 3.3 x 4.2 cm; second 85m nodule left breast UOQ: Posterior intramammary/low axillary lymph node, indeterminate left axillary lymph nodes, skin thickening      05/17/2016 - 08/29/2016 Neo-Adjuvant Chemotherapy    Taxol weekly 12 Herceptin and Perjeta every 3 weeks 6      09/05/2016 Breast MRI    Breast MRI: Complete Imaging response      10/18/2016 Surgery    Left modified radical mastectomy: Complete response to treatment 0/9 lymph nodes negative [id number]        CHIEF COMPLIANT: Surveillance of breast cancer  INTERVAL HISTORY: Jessica RIEDERERis a 84year old with above-mentioned history of left breast cancer was treated with neoadjuvant chemotherapy followed by mastectomy.  She had a complete pathologic response.  All the lymph nodes were  negative.  She is here for one-year follow-up.  She reports no major problems or concerns with the exception of having her teeth completely redone as well as tract surgery that appears to be the necessary.  REVIEW OF SYSTEMS:   Constitutional: Denies fevers, chills or abnormal weight loss Eyes: Denies blurriness of vision Ears, nose, mouth, throat, and face: Denies mucositis or sore throat Respiratory: Denies cough, dyspnea or wheezes Cardiovascular: Denies palpitation, chest discomfort Gastrointestinal:  Denies nausea, heartburn or change in bowel habits Skin: Denies abnormal skin rashes Lymphatics: Denies new lymphadenopathy or easy bruising Neurological:Denies numbness, tingling or new weaknesses Behavioral/Psych: Mood is stable, no new changes  Extremities: No lower extremity edema Breast: Left mastectomy All other systems were reviewed with the patient and are negative.  I have reviewed the past medical history, past surgical history, social history and family history with the patient and they are unchanged from previous note.  ALLERGIES:  is allergic to other.  MEDICATIONS:  Current Outpatient Medications  Medication Sig Dispense Refill  . amLODipine (NORVASC) 5 MG tablet TAKE 1 TABLET BY MOUTH DAILY 180 tablet 1  . Calcium Carb-Cholecalciferol (CALCIUM 600+D3 PO) Take 1 tablet by mouth 2 (two) times daily.    . Cholecalciferol (VITAMIN D3) 1000 UNITS CAPS Take 1,000 Units by mouth daily.     .Marland Kitchenlevothyroxine (SYNTHROID, LEVOTHROID) 88 MCG tablet TAKE 1 TABLET BY MOUTH DAILY EXCEPT WEDNESDAY TAKE 1/2 TABLET -- Must have labs for refills 32 tablet 0  . lidocaine-prilocaine (EMLA) cream APP AA ONCE  3  .  meclizine (ANTIVERT) 25 MG tablet TAKE 1/2 TABLET BY MOUTH THREE TIMES DAILY AS NEEDED 15 tablet 2  . Multiple Vitamins-Minerals (CENTRUM SILVER PO) Take 1 tablet by mouth daily.     . Multiple Vitamins-Minerals (PRESERVISION/LUTEIN) CAPS Take 1 capsule by mouth 2 (two) times daily.      . simvastatin (ZOCOR) 40 MG tablet TAKE 1 TABLET BY MOUTH DAILY 90 tablet 1   No current facility-administered medications for this visit.     PHYSICAL EXAMINATION: ECOG PERFORMANCE STATUS: 1 - Symptomatic but completely ambulatory  Vitals:   11/01/17 1116  BP: (!) 159/63  Pulse: 60  Resp: 18  Temp: 97.8 F (36.6 C)  SpO2: 100%   Filed Weights   11/01/17 1116  Weight: 115 lb 9.6 oz (52.4 kg)    GENERAL:alert, no distress and comfortable SKIN: skin color, texture, turgor are normal, no rashes or significant lesions EYES: normal, Conjunctiva are pink and non-injected, sclera clear OROPHARYNX:no exudate, no erythema and lips, buccal mucosa, and tongue normal  NECK: supple, thyroid normal size, non-tender, without nodularity LYMPH:  no palpable lymphadenopathy in the cervical, axillary or inguinal LUNGS: clear to auscultation and percussion with normal breathing effort HEART: regular rate & rhythm and no murmurs and no lower extremity edema ABDOMEN:abdomen soft, non-tender and normal bowel sounds MUSCULOSKELETAL:no cyanosis of digits and no clubbing  NEURO: alert & oriented x 3 with fluent speech, no focal motor/sensory deficits EXTREMITIES: No lower extremity edema BREAST: Left mastectomy no palpable lumps or nodules, numbness on the lateral posterior aspect of the chest wall (exam performed in the presence of a chaperone)  LABORATORY DATA:  I have reviewed the data as listed CMP Latest Ref Rng & Units 05/01/2017 03/20/2017 02/06/2017  Glucose 70 - 140 mg/dl 116 118 106  BUN 7.0 - 26.0 mg/dL 22.1 19.8 19.7  Creatinine 0.6 - 1.1 mg/dL 0.9 0.9 0.8  Sodium 136 - 145 mEq/L 140 142 138  Potassium 3.5 - 5.1 mEq/L 4.1 4.2 4.2  Chloride 96 - 112 mEq/L - - -  CO2 22 - 29 mEq/L 30(H) 29 28  Calcium 8.4 - 10.4 mg/dL 10.0 10.0 10.0  Total Protein 6.4 - 8.3 g/dL 7.0 7.2 7.0  Total Bilirubin 0.20 - 1.20 mg/dL 0.37 0.54 0.44  Alkaline Phos 40 - 150 U/L 72 77 74  AST 5 - 34 U/L '20 22  25  ' ALT 0 - 55 U/L '16 18 21    ' Lab Results  Component Value Date   WBC 7.3 11/01/2017   HGB 12.1 11/01/2017   HCT 36.7 11/01/2017   MCV 95.6 11/01/2017   PLT 265 11/01/2017   NEUTROABS 4.6 11/01/2017    ASSESSMENT & PLAN:  Breast cancer of upper-outer quadrant of left female breast ([place]) Left breast biopsy 04/12/2016: IDC grade 2-3, left axillary LN positive for metastatic IDC; mammogram revealed a 5 cm solid mass left breast 12:00 anterior depth. ER 0%, PR 0%, Ki-67 80%, HER-2 positive ratio 7.86, gene copy #16.5  Breast MRI: 05/16/2016:Left breast solid with cystic appearance OUQ 1:00 crossing into the medial left breast 4.7 x 3.3 x 4.2 cm; second 83m nodule left breast UOQ: Posterior intramammary/low axillary lymph node, indeterminate left axillary lymph nodes, skin thickening Bone scan and CT chest abdomen pelvis 05/10/2016: No metastatic disease Clinical stage: T2 N1 M0 stage IIB  Left modified radical mastectomy 10/18/2016: Complete response to treatment 0/9 lymph nodes negative ypT0ypN0  Treatment summary: 1. Neoadjuvant chemotherapy with Taxol Herceptin Perjeta. Taxol to be given  weekly 12, Herceptin Perjeta to be given every 3 weeks 6 cycles 05/17/16 to 08/29/16 2. Mastectomy with sentinel lymph node biopsy/selective axillary node dissection 10/18/2016: Complete pathologic response 3. Decided against doing radiation given her advanced age. 4. Maintenance Herceptin Perjeta every 3 weeks completed end of August 2018 ------------------------------------------------------------------------------------------------------------------- Surveillance: 1.  Breast exam 11/01/2017: Benign 2. Mammogram 04/17/2017: No evidence of malignancy, breast density category B  Patient is excited about going to a marine ball in [place] in May 2019.  Return to clinic in 1 year for follow-up   I spent 15 minutes talking to the patient of which more than half was spent in  counseling and coordination of care.  No orders of the defined types were placed in this encounter.  The patient has a good understanding of the overall plan. she agrees with it. she will call with any problems that may develop before the next visit here.   Harriette Ohara, MD 11/01/17

## 2017-11-01 NOTE — Telephone Encounter (Signed)
Gave patient AVs and calendar of upcoming February 2020 appointments. °

## 2017-11-01 NOTE — Assessment & Plan Note (Signed)
Left breast biopsy 04/12/2016: IDC grade 2-3, left axillary LN positive for metastatic IDC; mammogram revealed a 5 cm solid mass left breast 12:00 anterior depth. ER 0%, PR 0%, Ki-67 80%, HER-2 positive ratio 7.86, gene copy #16.5  Breast MRI: 05/16/2016:Left breast solid with cystic appearance OUQ 1:00 crossing into the medial left breast 4.7 x 3.3 x 4.2 cm; second 81m nodule left breast UOQ: Posterior intramammary/low axillary lymph node, indeterminate left axillary lymph nodes, skin thickening Bone scan and CT chest abdomen pelvis 05/10/2016: No metastatic disease Clinical stage: T2 N1 M0 stage IIB  Left modified radical mastectomy 10/18/2016: Complete response to treatment 0/9 lymph nodes negative ypT0ypN0  Treatment summary: 1. Neoadjuvant chemotherapy with Taxol Herceptin Perjeta. Taxol to be given weekly 12, Herceptin Perjeta to be given every 3 weeks 6 cycles 05/17/16 to 08/29/16 2. Mastectomy with sentinel lymph node biopsy/selective axillary node dissection 10/18/2016: Complete pathologic response 3. Decided against doing radiation given her advanced age. 4. Maintenance Herceptin Perjeta every 3 weeks completed end of August 2018 ------------------------------------------------------------------------------------------------------------------- Surveillance: 1.  Breast exam 11/01/2017: Benign 2. Mammogram 04/17/2017: No evidence of malignancy, breast density category B  Return to clinic in 1 year for follow-up

## 2017-11-06 DIAGNOSIS — H26491 Other secondary cataract, right eye: Secondary | ICD-10-CM | POA: Diagnosis not present

## 2017-11-20 ENCOUNTER — Other Ambulatory Visit: Payer: Self-pay | Admitting: Internal Medicine

## 2017-11-21 ENCOUNTER — Telehealth: Payer: Self-pay | Admitting: Internal Medicine

## 2017-11-21 NOTE — Telephone Encounter (Signed)
Copied from [place] [id number]. Topic: Quick Communication - Rx Refill/Question >> Nov 21, 2017  4:56 PM Cleaster Corin, NT wrote: Medication: levothyroxine (SYNTHROID, LEVOTHROID) 88 MCG tablet [[id number]   Has the patient contacted their pharmacy? yes  (Agent: If no, request that the patient contact the pharmacy for the refill.)   Preferred Pharmacy (with phone number or street name): [facility] - [place] AT [facility] [address] [place] [address] Phone: [phone] Fax: [phone]     Agent: Please be advised that RX refills may take up to 3 business days. We ask that you follow-up with your pharmacy.

## 2017-11-22 MED ORDER — LEVOTHYROXINE SODIUM 88 MCG PO TABS: ORAL_TABLET | ORAL | 0 refills | Status: DC

## 2017-11-22 NOTE — Telephone Encounter (Signed)
Per office policy sent 30 day to local pharmacy until appt.../lmb  

## 2017-11-22 NOTE — Telephone Encounter (Signed)
Request refill on Levothyroxine Last office visit 12/13/2016;  last TSH 12/19/16 PCP:  Dr. Quay Burow [facility]: [facility] [address] [place]

## 2017-12-12 DIAGNOSIS — C50912 Malignant neoplasm of unspecified site of left female breast: Secondary | ICD-10-CM | POA: Diagnosis not present

## 2017-12-19 ENCOUNTER — Other Ambulatory Visit: Payer: Self-pay | Admitting: Internal Medicine

## 2017-12-19 ENCOUNTER — Encounter: Payer: Self-pay | Admitting: Internal Medicine

## 2017-12-19 ENCOUNTER — Ambulatory Visit (INDEPENDENT_AMBULATORY_CARE_PROVIDER_SITE_OTHER): Payer: PPO | Admitting: Internal Medicine

## 2017-12-19 VITALS — BP 150/60 | HR 73 | Temp 98.2°F | Ht 63.0 in | Wt 118.0 lb

## 2017-12-19 DIAGNOSIS — E039 Hypothyroidism, unspecified: Secondary | ICD-10-CM | POA: Diagnosis not present

## 2017-12-19 DIAGNOSIS — R7303 Prediabetes: Secondary | ICD-10-CM | POA: Diagnosis not present

## 2017-12-19 DIAGNOSIS — I10 Essential (primary) hypertension: Secondary | ICD-10-CM

## 2017-12-19 DIAGNOSIS — Z0000 Encounter for general adult medical examination without abnormal findings: Secondary | ICD-10-CM

## 2017-12-19 DIAGNOSIS — E782 Mixed hyperlipidemia: Secondary | ICD-10-CM | POA: Diagnosis not present

## 2017-12-19 MED ORDER — LEVOTHYROXINE SODIUM 88 MCG PO TABS: ORAL_TABLET | ORAL | 0 refills | Status: DC

## 2017-12-19 MED ORDER — MELOXICAM 7.5 MG PO TABS: 7.5000 mg | ORAL_TABLET | Freq: Every day | ORAL | 0 refills | Status: DC

## 2017-12-19 NOTE — Assessment & Plan Note (Signed)
Check tsh  Titrate med dose if needed  

## 2017-12-19 NOTE — Assessment & Plan Note (Signed)
Check a1c Low sugar / carb diet Stressed regular exercise   

## 2017-12-19 NOTE — Progress Notes (Signed)
Subjective:    Patient ID: Jessica Sherman, female    DOB: Mar 17, 1931, 84 y.o.   MRN: [id number]  HPI She is here for a physical exam.   Sciatica:  It started about one week ago.  The pain starts in the buttock region to the ankle.  She denies numbness/tingling.  She denies weakness.  She took an otc pain medication and it helped.  It has improved.  She denies any specific injury or activity that caused it.   Medications and allergies reviewed with patient and updated if appropriate.  Patient Active Problem List   Diagnosis Date Noted  . Hypertension 12/13/2016  . S/P mastectomy, left 10/18/2016  . Breast cancer ([place]) 10/18/2016  . Port catheter in place 05/23/2016  . Breast cancer of upper-outer quadrant of left female breast ([place]) 04/30/2016  . Conductive hearing loss in right ear 12/09/2015  . Enteritis 05/30/2013  . Prediabetes   . Macular degeneration, dry   . Vertigo   . Anemia 09/02/2010  . Vitamin D deficiency 11/17/2008  . COLONIC POLYPS, HX OF 11/17/2008  . Hypothyroidism 11/19/2007  . HYPERLIPIDEMIA 11/19/2007  . Osteopenia 02/18/2007    Current Outpatient Medications on File Prior to Visit  Medication Sig Dispense Refill  . amLODipine (NORVASC) 5 MG tablet TAKE 1 TABLET BY MOUTH DAILY 180 tablet 1  . Calcium Carb-Cholecalciferol (CALCIUM 600+D3 PO) Take 1 tablet by mouth 2 (two) times daily.    . Cholecalciferol (VITAMIN D3) 1000 UNITS CAPS Take 1,000 Units by mouth daily.     Marland Kitchen levothyroxine (SYNTHROID, LEVOTHROID) 88 MCG tablet TAKE 1 TABLET BY MOUTH DAILY EXCEPT WEDNESDAY TAKE 1/2 TABLET -- Must have labs for refills 32 tablet 0  . lidocaine-prilocaine (EMLA) cream APP AA ONCE  3  . meclizine (ANTIVERT) 25 MG tablet TAKE 1/2 TABLET BY MOUTH THREE TIMES DAILY AS NEEDED 15 tablet 2  . Multiple Vitamins-Minerals (CENTRUM SILVER PO) Take 1 tablet by mouth daily.     . Multiple Vitamins-Minerals (PRESERVISION/LUTEIN) CAPS Take 1 capsule by mouth 2 (two) times  daily.     . simvastatin (ZOCOR) 40 MG tablet TAKE 1 TABLET BY MOUTH DAILY 90 tablet 1  . [DISCONTINUED] prochlorperazine (COMPAZINE) 10 MG tablet Take 1 tablet (10 mg total) by mouth every 6 (six) hours as needed (Nausea or vomiting). (Patient not taking: Reported on 07/27/2016) 30 tablet 1   No current facility-administered medications on file prior to visit.     Past Medical History:  Diagnosis Date  . Hx of colonic polyps   . Hyperlipidemia   . Hypothyroidism   . Macular degeneration, dry   . Osteopenia    spine BMD T score - 1.69  . PONV (postoperative nausea and vomiting)    pt has vertigo and can not lie flat  . Prediabetes    FBS 136  on 08/29/2010; A1c 6.2%  . Vertigo     Past Surgical History:  Procedure Laterality Date  . CATARACT EXTRACTION  2009   OS  . COLONOSCOPY W/ POLYPECTOMY     X 2; last 2012. Dr Watt Climes  . IR GENERIC HISTORICAL  05/09/2016   IR US GUIDE VASC ACCESS RIGHT 05/09/2016 Jacqulynn Cadet, MD [facility]  . IR GENERIC HISTORICAL  05/09/2016   IR FLUORO GUIDE CV LINE RIGHT 05/09/2016 Jacqulynn Cadet, MD [facility]  . MODIFIED MASTECTOMY Left 10/18/2016   Procedure: LEFT MODIFIED RADICAL MASTECTOMY;  Surgeon: [name], MD;  Location: [facility];  Service: General;  Laterality: Left;  . PILONIDAL CYST EXCISION     X 3  . TONSILLECTOMY AND ADENOIDECTOMY    . TOTAL ABDOMINAL HYSTERECTOMY     w/ bladder repair    Social History   Socioeconomic History  . Marital status: Widowed    Spouse name: Not on file  . Number of children: Not on file  . Years of education: Not on file  . Highest education level: Not on file  Occupational History  . Occupation: retired  Scientific laboratory technician  . Financial resource strain: Not on file  . Food insecurity:    Worry: Not on file    Inability: Not on file  . Transportation needs:    Medical: Not on file    Non-medical: Not on file  Tobacco Use  . Smoking status: Never Smoker  . Smokeless  tobacco: Never Used  Substance and Sexual Activity  . Alcohol use: No    Alcohol/week: 0.0 oz    Comment:    . Drug use: No  . Sexual activity: Not on file  Lifestyle  . Physical activity:    Days per week: Not on file    Minutes per session: Not on file  . Stress: Not on file  Relationships  . Social connections:    Talks on phone: Not on file    Gets together: Not on file    Attends religious service: Not on file    Active member of club or organization: Not on file    Attends meetings of clubs or organizations: Not on file    Relationship status: Not on file  Other Topics Concern  . Not on file  Social History Narrative   Regular exercise- yes 2mpd w/o symptoms    Family History  Problem Relation Age of Onset  . Osteoporosis Mother   . Leukemia Mother   . Cancer Maternal Uncle        ? stomach  . Aneurysm Father        AAA  . Heart attack Neg Hx     Review of Systems  Constitutional: Negative for chills, fatigue and fever.  Eyes: Negative for visual disturbance.  Respiratory: Negative for cough, shortness of breath and wheezing.   Cardiovascular: Negative for chest pain, palpitations and leg swelling.  Gastrointestinal: Positive for diarrhea (certain foods trigger it). Negative for abdominal pain, blood in stool, constipation and nausea.  Genitourinary: Negative for dysuria and hematuria.  Musculoskeletal: Negative for arthralgias.  Skin:       Lesion left ear  Neurological: Negative for light-headedness and headaches.  Psychiatric/Behavioral: Negative for dysphoric mood and sleep disturbance. The patient is not nervous/anxious.        Objective:   Vitals:   12/19/17 1346  BP: (!) 150/60  Pulse: 73  Temp: 98.2 F (36.8 C)  SpO2: 96%   Filed Weights   12/19/17 1346  Weight: 118 lb (53.5 kg)   Body mass index is 20.9 kg/m.  Wt Readings from Last 3 Encounters:  12/19/17 118 lb (53.5 kg)  11/01/17 115 lb 9.6 oz (52.4 kg)  07/03/17 115 lb 14.4 oz  (52.6 kg)     Physical Exam Constitutional: She appears well-developed and well-nourished. No distress.  HENT:  Head: Normocephalic and atraumatic.  Right Ear: External ear normal. Normal ear canal and TM Left Ear: External ear normal.  Normal ear canal and TM Mouth/Throat: Oropharynx is clear and moist.  Eyes: Conjunctivae and EOM are normal.  Neck: Neck  supple. No tracheal deviation present. No thyromegaly present.  No carotid bruit  Cardiovascular: Normal rate, regular rhythm and normal heart sounds.   No murmur heard.  No edema. Pulmonary/Chest: Effort normal and breath sounds normal. No respiratory distress. She has no wheezes. She has no rales.  Breast: deferred Abdominal: Soft. She exhibits no distension. There is no tenderness.  Lymphadenopathy: She has no cervical adenopathy.  Skin: Skin is warm and dry. She is not diaphoretic.  Psychiatric: She has a normal mood and affect. Her behavior is normal.        Assessment & Plan:   Physical exam: Screening blood work  ordered Immunizations discussed shingles vaccine, others up-to-date Colonoscopy   no longer required based on age 68   Up to date  Dexa   Up to date  Eye exams   Up to date  EKG      10/2016 Exercise none - will start water walking, will try to restart walking Weight  Normal BMI - has gained some weight Skin ear lesion - left ear - see derm Substance abuse   none  See Problem List for Assessment and Plan of chronic medical problems.   FU in 6 months

## 2017-12-19 NOTE — Assessment & Plan Note (Signed)
Check lipid panel  Continue daily statin Regular exercise and healthy diet encouraged  

## 2017-12-19 NOTE — Assessment & Plan Note (Signed)
Start monitoring at home - ? White coat htn or truly elevated Will continue current medication cmp

## 2017-12-19 NOTE — Telephone Encounter (Signed)
Patient wanted a 90 day supply of levothyroxine sent to [facility]

## 2017-12-19 NOTE — Patient Instructions (Addendum)
Test(s) ordered today. Your results will be released to [facility] (or called to you) after review, usually within 72hours after test completion. If any changes need to be made, you will be notified at that same time.  All other Health Maintenance issues reviewed.   All recommended immunizations and age-appropriate screenings are up-to-date or discussed.  No immunizations administered today.   Medications reviewed and updated.  Changes include meclizine for your back pain.  Your prescription(s) have been submitted to your pharmacy. Please take as directed and contact our office if you believe you are having problem(s) with the medication(s).  Please followup in 6 months   Health Maintenance, Female Adopting a healthy lifestyle and getting preventive care can go a long way to promote health and wellness. Talk with your health care provider about what schedule of regular examinations is right for you. This is a good chance for you to check in with your provider about disease prevention and staying healthy. In between checkups, there are plenty of things you can do on your own. Experts have done a lot of research about which lifestyle changes and preventive measures are most likely to keep you healthy. Ask your health care provider for more information. Weight and diet Eat a healthy diet  Be sure to include plenty of vegetables, fruits, low-fat dairy products, and lean protein.  Do not eat a lot of foods high in solid fats, added sugars, or salt.  Get regular exercise. This is one of the most important things you can do for your health. ? Most adults should exercise for at least 150 minutes each week. The exercise should increase your heart rate and make you sweat (moderate-intensity exercise). ? Most adults should also do strengthening exercises at least twice a week. This is in addition to the moderate-intensity exercise.  Maintain a healthy weight  Body mass index (BMI) is a measurement  that can be used to identify possible weight problems. It estimates body fat based on height and weight. Your health care provider can help determine your BMI and help you achieve or maintain a healthy weight.  For females 67 years of age and older: ? A BMI below 18.5 is considered underweight. ? A BMI of 18.5 to 24.9 is normal. ? A BMI of 25 to 29.9 is considered overweight. ? A BMI of 30 and above is considered obese.  Watch levels of cholesterol and blood lipids  You should start having your blood tested for lipids and cholesterol at 82 years of age, then have this test every 5 years.  You may need to have your cholesterol levels checked more often if: ? Your lipid or cholesterol levels are high. ? You are older than 82 years of age. ? You are at high risk for heart disease.  Cancer screening Lung Cancer  Lung cancer screening is recommended for adults 88-19 years old who are at high risk for lung cancer because of a history of smoking.  A yearly low-dose CT scan of the lungs is recommended for people who: ? Currently smoke. ? Have quit within the past 15 years. ? Have at least a 30-pack-year history of smoking. A pack year is smoking an average of one pack of cigarettes a day for 1 year.  Yearly screening should continue until it has been 15 years since you quit.  Yearly screening should stop if you develop a health problem that would prevent you from having lung cancer treatment.  Breast Cancer  Practice breast self-awareness.  This means understanding how your breasts normally appear and feel.  It also means doing regular breast self-exams. Let your health care provider know about any changes, no matter how small.  If you are in your 20s or 30s, you should have a clinical breast exam (CBE) by a health care provider every 1-3 years as part of a regular health exam.  If you are 33 or older, have a CBE every year. Also consider having a breast X-ray (mammogram) every  year.  If you have a family history of breast cancer, talk to your health care provider about genetic screening.  If you are at high risk for breast cancer, talk to your health care provider about having an MRI and a mammogram every year.  Breast cancer gene (BRCA) assessment is recommended for women who have family members with BRCA-related cancers. BRCA-related cancers include: ? Breast. ? Ovarian. ? Tubal. ? Peritoneal cancers.  Results of the assessment will determine the need for genetic counseling and BRCA1 and BRCA2 testing.  Cervical Cancer Your health care provider may recommend that you be screened regularly for cancer of the pelvic organs (ovaries, uterus, and vagina). This screening involves a pelvic examination, including checking for microscopic changes to the surface of your cervix (Pap test). You may be encouraged to have this screening done every 3 years, beginning at age 75.  For women ages 33-65, health care providers may recommend pelvic exams and Pap testing every 3 years, or they may recommend the Pap and pelvic exam, combined with testing for human papilloma virus (HPV), every 5 years. Some types of HPV increase your risk of cervical cancer. Testing for HPV may also be done on women of any age with unclear Pap test results.  Other health care providers may not recommend any screening for nonpregnant women who are considered low risk for pelvic cancer and who do not have symptoms. Ask your health care provider if a screening pelvic exam is right for you.  If you have had past treatment for cervical cancer or a condition that could lead to cancer, you need Pap tests and screening for cancer for at least 20 years after your treatment. If Pap tests have been discontinued, your risk factors (such as having a new sexual partner) need to be reassessed to determine if screening should resume. Some women have medical problems that increase the chance of getting cervical cancer. In  these cases, your health care provider may recommend more frequent screening and Pap tests.  Colorectal Cancer  This type of cancer can be detected and often prevented.  Routine colorectal cancer screening usually begins at 82 years of age and continues through 82 years of age.  Your health care provider may recommend screening at an earlier age if you have risk factors for colon cancer.  Your health care provider may also recommend using home test kits to check for hidden blood in the stool.  A small camera at the end of a tube can be used to examine your colon directly (sigmoidoscopy or colonoscopy). This is done to check for the earliest forms of colorectal cancer.  Routine screening usually begins at age 27.  Direct examination of the colon should be repeated every 5-10 years through 82 years of age. However, you may need to be screened more often if early forms of precancerous polyps or small growths are found.  Skin Cancer  Check your skin from head to toe regularly.  Tell your health care provider about any  new moles or changes in moles, especially if there is a change in a mole's shape or color.  Also tell your health care provider if you have a mole that is larger than the size of a pencil eraser.  Always use sunscreen. Apply sunscreen liberally and repeatedly throughout the day.  Protect yourself by wearing long sleeves, pants, a wide-brimmed hat, and sunglasses whenever you are outside.  Heart disease, diabetes, and high blood pressure  High blood pressure causes heart disease and increases the risk of stroke. High blood pressure is more likely to develop in: ? People who have blood pressure in the high end of the normal range (130-139/85-89 mm Hg). ? People who are overweight or obese. ? People who are African American.  If you are 64-10 years of age, have your blood pressure checked every 3-5 years. If you are 47 years of age or older, have your blood pressure  checked every year. You should have your blood pressure measured twice-once when you are at a hospital or clinic, and once when you are not at a hospital or clinic. Record the average of the two measurements. To check your blood pressure when you are not at a hospital or clinic, you can use: ? An automated blood pressure machine at a pharmacy. ? A home blood pressure monitor.  If you are between 65 years and 54 years old, ask your health care provider if you should take aspirin to prevent strokes.  Have regular diabetes screenings. This involves taking a blood sample to check your fasting blood sugar level. ? If you are at a normal weight and have a low risk for diabetes, have this test once every three years after 82 years of age. ? If you are overweight and have a high risk for diabetes, consider being tested at a younger age or more often. Preventing infection Hepatitis B  If you have a higher risk for hepatitis B, you should be screened for this virus. You are considered at high risk for hepatitis B if: ? You were born in a country where hepatitis B is common. Ask your health care provider which countries are considered high risk. ? Your parents were born in a high-risk country, and you have not been immunized against hepatitis B (hepatitis B vaccine). ? You have HIV or AIDS. ? You use needles to inject street drugs. ? You live with someone who has hepatitis B. ? You have had sex with someone who has hepatitis B. ? You get hemodialysis treatment. ? You take certain medicines for conditions, including cancer, organ transplantation, and autoimmune conditions.  Hepatitis C  Blood testing is recommended for: ? Everyone born from [birth year] through 1963. ? Anyone with known risk factors for hepatitis C.  Sexually transmitted infections (STIs)  You should be screened for sexually transmitted infections (STIs) including gonorrhea and chlamydia if: ? You are sexually active and are younger than  82 years of age. ? You are older than 82 years of age and your health care provider tells you that you are at risk for this type of infection. ? Your sexual activity has changed since you were last screened and you are at an increased risk for chlamydia or gonorrhea. Ask your health care provider if you are at risk.  If you do not have HIV, but are at risk, it may be recommended that you take a prescription medicine daily to prevent HIV infection. This is called pre-exposure prophylaxis (PrEP). You are considered at  risk if: ? You are sexually active and do not regularly use condoms or know the HIV status of your partner(s). ? You take drugs by injection. ? You are sexually active with a partner who has HIV.  Talk with your health care provider about whether you are at high risk of being infected with HIV. If you choose to begin PrEP, you should first be tested for HIV. You should then be tested every 3 months for as long as you are taking PrEP. Pregnancy  If you are premenopausal and you may become pregnant, ask your health care provider about preconception counseling.  If you may become pregnant, take 400 to 800 micrograms (mcg) of folic acid every day.  If you want to prevent pregnancy, talk to your health care provider about birth control (contraception). Osteoporosis and menopause  Osteoporosis is a disease in which the bones lose minerals and strength with aging. This can result in serious bone fractures. Your risk for osteoporosis can be identified using a bone density scan.  If you are 26 years of age or older, or if you are at risk for osteoporosis and fractures, ask your health care provider if you should be screened.  Ask your health care provider whether you should take a calcium or vitamin D supplement to lower your risk for osteoporosis.  Menopause may have certain physical symptoms and risks.  Hormone replacement therapy may reduce some of these symptoms and risks. Talk to  your health care provider about whether hormone replacement therapy is right for you. Follow these instructions at home:  Schedule regular health, dental, and eye exams.  Stay current with your immunizations.  Do not use any tobacco products including cigarettes, chewing tobacco, or electronic cigarettes.  If you are pregnant, do not drink alcohol.  If you are breastfeeding, limit how much and how often you drink alcohol.  Limit alcohol intake to no more than 1 drink per day for nonpregnant women. One drink equals 12 ounces of beer, 5 ounces of wine, or 1 ounces of hard liquor.  Do not use street drugs.  Do not share needles.  Ask your health care provider for help if you need support or information about quitting drugs.  Tell your health care provider if you often feel depressed.  Tell your health care provider if you have ever been abused or do not feel safe at home. This information is not intended to replace advice given to you by your health care provider. Make sure you discuss any questions you have with your health care provider. Document Released: 03/13/2011 Document Revised: 02/03/2016 Document Reviewed: 06/01/2015 Elsevier Interactive Patient Education  [employer].

## 2018-01-04 ENCOUNTER — Other Ambulatory Visit (INDEPENDENT_AMBULATORY_CARE_PROVIDER_SITE_OTHER): Payer: PPO

## 2018-01-04 DIAGNOSIS — R7303 Prediabetes: Secondary | ICD-10-CM

## 2018-01-04 DIAGNOSIS — I10 Essential (primary) hypertension: Secondary | ICD-10-CM

## 2018-01-04 DIAGNOSIS — E782 Mixed hyperlipidemia: Secondary | ICD-10-CM

## 2018-01-04 DIAGNOSIS — E039 Hypothyroidism, unspecified: Secondary | ICD-10-CM

## 2018-01-04 LAB — COMPREHENSIVE METABOLIC PANEL
ALBUMIN: 4.2 g/dL (ref 3.5–5.2)
ALK PHOS: 62 U/L (ref 39–117)
ALT: 21 U/L (ref 0–35)
AST: 22 U/L (ref 0–37)
BILIRUBIN TOTAL: 0.5 mg/dL (ref 0.2–1.2)
BUN: 25 mg/dL — ABNORMAL HIGH (ref 6–23)
CALCIUM: 9.8 mg/dL (ref 8.4–10.5)
CO2: 30 mEq/L (ref 19–32)
Chloride: 100 mEq/L (ref 96–112)
Creatinine, Ser: 0.78 mg/dL (ref 0.40–1.20)
GFR: 74.55 mL/min (ref >60.00)
Glucose, Bld: 94 mg/dL (ref 70–99)
POTASSIUM: 4.5 meq/L (ref 3.5–5.1)
Sodium: 140 mEq/L (ref 135–145)
TOTAL PROTEIN: 7.4 g/dL (ref 6.0–8.3)

## 2018-01-04 LAB — CBC WITH DIFFERENTIAL/PLATELET
BASOS ABS: 0.1 10*3/uL (ref 0.0–0.1)
Basophils Relative: 0.9 % (ref 0.0–3.0)
EOS PCT: 3.8 % (ref 0.0–5.0)
Eosinophils Absolute: 0.3 10*3/uL (ref 0.0–0.7)
HCT: 38.4 % (ref 36.0–46.0)
HEMOGLOBIN: 12.8 g/dL (ref 12.0–15.0)
LYMPHS PCT: 36.3 % (ref 12.0–46.0)
Lymphs Abs: 2.8 10*3/uL (ref 0.7–4.0)
MCHC: 33.4 g/dL (ref 30.0–36.0)
MCV: 96.1 fl (ref 78.0–100.0)
MONOS PCT: 12 % (ref 3.0–12.0)
Monocytes Absolute: 0.9 10*3/uL (ref 0.1–1.0)
Neutro Abs: 3.6 10*3/uL (ref 1.4–7.7)
Neutrophils Relative %: 47 % (ref 43.0–77.0)
Platelets: 265 10*3/uL (ref 150.0–400.0)
RBC: 4 Mil/uL (ref 3.87–5.11)
RDW: 13.1 % (ref 11.5–15.5)
WBC: 7.7 10*3/uL (ref 4.0–10.5)

## 2018-01-04 LAB — TSH: TSH: 0.75 u[IU]/mL (ref 0.35–4.50)

## 2018-01-04 LAB — LIPID PANEL
Cholesterol: 177 mg/dL (ref 0–200)
HDL: 68.4 mg/dL (ref >39.00)
LDL Cholesterol: 72 mg/dL (ref 0–99)
NonHDL: 108.42
TRIGLYCERIDES: 182 mg/dL — AB (ref 0.0–149.0)
Total CHOL/HDL Ratio: 3
VLDL: 36.4 mg/dL (ref 0.0–40.0)

## 2018-01-04 LAB — HEMOGLOBIN A1C: Hgb A1c MFr Bld: 6 % (ref 4.6–6.5)

## 2018-01-19 ENCOUNTER — Other Ambulatory Visit: Payer: Self-pay | Admitting: Internal Medicine

## 2018-01-24 ENCOUNTER — Other Ambulatory Visit: Payer: Self-pay | Admitting: Internal Medicine

## 2018-04-18 DIAGNOSIS — Z1231 Encounter for screening mammogram for malignant neoplasm of breast: Secondary | ICD-10-CM | POA: Diagnosis not present

## 2018-04-18 LAB — HM MAMMOGRAPHY

## 2018-04-23 ENCOUNTER — Telehealth: Payer: Self-pay | Admitting: Internal Medicine

## 2018-04-23 ENCOUNTER — Encounter: Payer: Self-pay | Admitting: Internal Medicine

## 2018-04-23 NOTE — Telephone Encounter (Signed)
Copied from [place] [id number]. Topic: Quick Communication - See Telephone Encounter >> Apr 23, 2018  1:21 PM Rutherford Nail, [place] wrote: CRM for notification. See Telephone encounter for: 04/23/18. Patient called and states that she has a missed call from [facility]. There was no callback number or what the call was regarding. Patient states that it could be about her mammogram results. Please advise. Patient is very anxious to get these results back.

## 2018-04-25 NOTE — Telephone Encounter (Signed)
I do not see where we tried calling her. I called her and left a detailed message informing her of this and that per 04/23/18 abstract encounter, it appears her mammogram was normal. However, I do not see report or results in chart yet.

## 2018-06-18 NOTE — Patient Instructions (Addendum)
  Tests ordered today. Your results will be released to MyChart (or called to you) after review, usually within 72hours after test completion. If any changes need to be made, you will be notified at that same time.  Medications reviewed and updated.  Changes include :   none      Please followup in 6 months   

## 2018-06-18 NOTE — Progress Notes (Signed)
Subjective:    Patient ID: Jessica Sherman, female    DOB: [birth year], 84 y.o.   MRN: [id number]  HPI The patient is here for follow up.  Hypertension: She is taking her medication daily. She is compliant with a low sodium diet.  She denies chest pain, palpitations, edema, shortness of breath and regular headaches. She is exercising regularly.  She does not monitor her blood pressure at home.    Hypothyroidism:  She is taking her medication daily.  She denies any recent changes in energy or weight that are unexplained.   Hyperlipidemia: She is taking her medication daily. She is compliant with a low fat/cholesterol diet. She is exercising regularly. She denies myalgias.   Prediabetes:  She is compliant with a low sugar/carbohydrate diet.  She is exercising regularly.    Medications and allergies reviewed with patient and updated if appropriate.  Patient Active Problem List   Diagnosis Date Noted  . Hypertension 12/13/2016  . S/P mastectomy, left 10/18/2016  . Breast cancer ([facility]) 10/18/2016  . Port catheter in place 05/23/2016  . Breast cancer of upper-outer quadrant of left female breast ([facility]) 04/30/2016  . Conductive hearing loss in right ear 12/09/2015  . Enteritis 05/30/2013  . Prediabetes   . Macular degeneration, dry   . Vertigo   . Anemia 09/02/2010  . Vitamin D deficiency 11/17/2008  . COLONIC POLYPS, HX OF 11/17/2008  . Hypothyroidism 11/19/2007  . HYPERLIPIDEMIA 11/19/2007  . Osteopenia 02/18/2007    Current Outpatient Medications on File Prior to Visit  Medication Sig Dispense Refill  . amLODipine (NORVASC) 5 MG tablet TAKE 1 TABLET BY MOUTH DAILY 180 tablet 1  . Calcium Carb-Cholecalciferol (CALCIUM 600+D3 PO) Take 1 tablet by mouth 2 (two) times daily.    . Cholecalciferol (VITAMIN D3) 1000 UNITS CAPS Take 1,000 Units by mouth daily.     Marland Kitchen levothyroxine (SYNTHROID, LEVOTHROID) 88 MCG tablet TAKE 1 TABLET BY MOUTH EVERY DAY EXCEPT ON WEDNESDAY TAKE 1/2  TABLET 90 tablet 1  . Multiple Vitamins-Minerals (CENTRUM SILVER PO) Take 1 tablet by mouth daily.     . Multiple Vitamins-Minerals (PRESERVISION/LUTEIN) CAPS Take 1 capsule by mouth 2 (two) times daily.     . simvastatin (ZOCOR) 40 MG tablet TAKE 1 TABLET BY MOUTH DAILY 90 tablet 1  . [DISCONTINUED] prochlorperazine (COMPAZINE) 10 MG tablet Take 1 tablet (10 mg total) by mouth every 6 (six) hours as needed (Nausea or vomiting). (Patient not taking: Reported on 07/27/2016) 30 tablet 1   No current facility-administered medications on file prior to visit.     Past Medical History:  Diagnosis Date  . Hx of colonic polyps   . Hyperlipidemia   . Hypothyroidism   . Macular degeneration, dry   . Osteopenia    spine BMD T score - 1.69  . PONV (postoperative nausea and vomiting)    pt has vertigo and can not lie flat  . Prediabetes    FBS 136  on 08/29/2010; A1c 6.2%  . Vertigo     Past Surgical History:  Procedure Laterality Date  . CATARACT EXTRACTION  2009   OS  . COLONOSCOPY W/ POLYPECTOMY     X 2; last 2012. Dr Watt Climes  . IR GENERIC HISTORICAL  05/09/2016   IR US GUIDE VASC ACCESS RIGHT 05/09/2016 Jacqulynn Cadet, MD [facility]  . IR GENERIC HISTORICAL  05/09/2016   IR FLUORO GUIDE CV LINE RIGHT 05/09/2016 Jacqulynn Cadet, MD [facility]  . MODIFIED MASTECTOMY  Left 10/18/2016   Procedure: LEFT MODIFIED RADICAL MASTECTOMY;  Surgeon: [name], MD;  Location: [facility];  Service: General;  Laterality: Left;  . PILONIDAL CYST EXCISION     X 3  . TONSILLECTOMY AND ADENOIDECTOMY    . TOTAL ABDOMINAL HYSTERECTOMY     w/ bladder repair    Social History   Socioeconomic History  . Marital status: Widowed    Spouse name: Not on file  . Number of children: Not on file  . Years of education: Not on file  . Highest education level: Not on file  Occupational History  . Occupation: retired  Scientific laboratory technician  . Financial resource strain: Not on file  . Food  insecurity:    Worry: Not on file    Inability: Not on file  . Transportation needs:    Medical: Not on file    Non-medical: Not on file  Tobacco Use  . Smoking status: Never Smoker  . Smokeless tobacco: Never Used  Substance and Sexual Activity  . Alcohol use: No    Comment:    . Drug use: No  . Sexual activity: Not on file  Lifestyle  . Physical activity:    Days per week: Not on file    Minutes per session: Not on file  . Stress: Not on file  Relationships  . Social connections:    Talks on phone: Not on file    Gets together: Not on file    Attends religious service: Not on file    Active member of club or organization: Not on file    Attends meetings of clubs or organizations: Not on file    Relationship status: Not on file  Other Topics Concern  . Not on file  Social History Narrative   Regular exercise- yes 19mpd w/o symptoms    Family History  Problem Relation Age of Onset  . Osteoporosis Mother   . Leukemia Mother   . Cancer Maternal Uncle        ? stomach  . Aneurysm Father        AAA  . Heart attack Neg Hx     Review of Systems  Constitutional: Negative for chills, fatigue and fever.  Respiratory: Negative for cough, shortness of breath and wheezing.   Cardiovascular: Negative for chest pain, palpitations and leg swelling.  Neurological: Negative for light-headedness and headaches.       Objective:   Vitals:   06/19/18 1345  BP: (!) 156/68  Pulse: 63  Resp: 16  Temp: 98 F (36.7 C)  SpO2: 96%   BP Readings from Last 3 Encounters:  06/19/18 (!) 156/68  12/19/17 (!) 150/60  11/01/17 (!) 159/63   Wt Readings from Last 3 Encounters:  06/19/18 119 lb (54 kg)  12/19/17 118 lb (53.5 kg)  11/01/17 115 lb 9.6 oz (52.4 kg)   Body mass index is 21.08 kg/m.   Physical Exam    Constitutional: Appears well-developed and well-nourished. No distress.  HENT:  Head: Normocephalic and atraumatic.  Neck: Neck supple. No tracheal deviation  present. No thyromegaly present.  No cervical lymphadenopathy Cardiovascular: Normal rate, regular rhythm and normal heart sounds.   No murmur heard. No carotid bruit .  No edema Pulmonary/Chest: Effort normal and breath sounds normal. No respiratory distress. No has no wheezes. No rales.  Skin: Skin is warm and dry. Not diaphoretic.  Psychiatric: Normal mood and affect. Behavior is normal.      Assessment & Plan:  See Problem List for Assessment and Plan of chronic medical problems.

## 2018-06-19 ENCOUNTER — Encounter: Payer: Self-pay | Admitting: Internal Medicine

## 2018-06-19 ENCOUNTER — Ambulatory Visit (INDEPENDENT_AMBULATORY_CARE_PROVIDER_SITE_OTHER): Payer: PPO | Admitting: Internal Medicine

## 2018-06-19 ENCOUNTER — Other Ambulatory Visit (INDEPENDENT_AMBULATORY_CARE_PROVIDER_SITE_OTHER): Payer: PPO

## 2018-06-19 VITALS — BP 156/68 | HR 63 | Temp 98.0°F | Resp 16 | Ht 63.0 in | Wt 119.0 lb

## 2018-06-19 DIAGNOSIS — E039 Hypothyroidism, unspecified: Secondary | ICD-10-CM

## 2018-06-19 DIAGNOSIS — I10 Essential (primary) hypertension: Secondary | ICD-10-CM

## 2018-06-19 DIAGNOSIS — R7303 Prediabetes: Secondary | ICD-10-CM

## 2018-06-19 DIAGNOSIS — E782 Mixed hyperlipidemia: Secondary | ICD-10-CM | POA: Diagnosis not present

## 2018-06-19 LAB — COMPREHENSIVE METABOLIC PANEL
ALK PHOS: 61 U/L (ref 39–117)
ALT: 16 U/L (ref 0–35)
AST: 18 U/L (ref 0–37)
Albumin: 4.1 g/dL (ref 3.5–5.2)
BUN: 22 mg/dL (ref 6–23)
CO2: 31 meq/L (ref 19–32)
Calcium: 9.6 mg/dL (ref 8.4–10.5)
Chloride: 100 mEq/L (ref 96–112)
Creatinine, Ser: 0.73 mg/dL (ref 0.40–1.20)
GFR: 80.39 mL/min (ref >60.00)
GLUCOSE: 95 mg/dL (ref 70–99)
POTASSIUM: 4 meq/L (ref 3.5–5.1)
Sodium: 137 mEq/L (ref 135–145)
Total Bilirubin: 0.3 mg/dL (ref 0.2–1.2)
Total Protein: 7.3 g/dL (ref 6.0–8.3)

## 2018-06-19 LAB — TSH: TSH: 0.71 u[IU]/mL (ref 0.35–4.50)

## 2018-06-19 LAB — HEMOGLOBIN A1C: Hgb A1c MFr Bld: 6 % (ref 4.6–6.5)

## 2018-06-19 NOTE — Assessment & Plan Note (Addendum)
BP elevated here today Advised to start check BP for a couple of weeks Continue current medications for now cmp

## 2018-06-19 NOTE — Assessment & Plan Note (Signed)
Lipids have been well controlled Continue daily statin Regular exercise and healthy diet encouraged

## 2018-06-19 NOTE — Assessment & Plan Note (Signed)
Check a1c Low sugar / carb diet Stressed regular exercise   

## 2018-06-19 NOTE — Assessment & Plan Note (Signed)
Clinically euthyroid Check tsh  Titrate med dose if needed  

## 2018-07-20 ENCOUNTER — Other Ambulatory Visit: Payer: Self-pay | Admitting: Nurse Practitioner

## 2018-07-31 ENCOUNTER — Other Ambulatory Visit (HOSPITAL_COMMUNITY): Payer: Self-pay | Admitting: Internal Medicine

## 2018-07-31 ENCOUNTER — Other Ambulatory Visit: Payer: Self-pay | Admitting: Internal Medicine

## 2018-07-31 DIAGNOSIS — I10 Essential (primary) hypertension: Secondary | ICD-10-CM

## 2018-08-01 ENCOUNTER — Other Ambulatory Visit (HOSPITAL_COMMUNITY): Payer: Self-pay | Admitting: Internal Medicine

## 2018-08-01 DIAGNOSIS — I10 Essential (primary) hypertension: Secondary | ICD-10-CM

## 2018-08-02 ENCOUNTER — Telehealth: Payer: Self-pay | Admitting: Internal Medicine

## 2018-08-02 DIAGNOSIS — I10 Essential (primary) hypertension: Secondary | ICD-10-CM

## 2018-08-02 NOTE — Telephone Encounter (Signed)
Ok to fill until pt sees cardiology?

## 2018-08-02 NOTE — Telephone Encounter (Signed)
LVM letting pt know.  

## 2018-08-02 NOTE — Telephone Encounter (Signed)
Copied from [place] #[id number]. Topic: Quick Communication - Rx Refill/Question >> Aug 02, 2018  9:46 AM Margot Ables wrote: Medication: amLODipine (NORVASC) 5 MG tablet - pt will be out Sunday - previously ordered by Dr. Haroldine Laws but pt is past due for appt with him. Advised by Cardiology to contact PCP. Please advise.  Has the patient contacted their pharmacy? Yes - declined by Cardiology Preferred Pharmacy (with phone number or street name): [id number] [address], [place] AT [address] [phone] (Phone) [phone] (Fax)

## 2018-08-02 NOTE — Telephone Encounter (Signed)
Ok to fill.  Should ideally follow up with Dr B

## 2018-10-17 ENCOUNTER — Telehealth: Payer: Self-pay | Admitting: Internal Medicine

## 2018-10-17 ENCOUNTER — Other Ambulatory Visit: Payer: Self-pay

## 2018-10-17 NOTE — Telephone Encounter (Signed)
Copied from [place] [id number]. Topic: Quick Communication - Rx Refill/Question >> Oct 17, 2018  4:03 PM Reyne Dumas L wrote: Medication:  simvastatin (ZOCOR) 40 MG tablet  Vicente Males from [facility] calling about medication adherence.  States that pt is telling them she is only supposed to be taking 1/2 tablet daily.  [facility] needs to know what pt is supposed to be using. Vicente Males can be reached at [phone]

## 2018-10-17 NOTE — Patient Outreach (Signed)
[facility]) Care Management  10/17/2018  AMMARIE MATSUURA 08/23/1931 [id number]   Medication Adherence call to Mrs. Kindall Swaby spoke with patient she is only taking 1/2 tablet on Simvastatin 40 mg in the evening, this is how doctor wants her to take this medication, patient said she will be seen her doctor in March. Left a message at doctor office letting  then know patient is only taking 1/2 tablet at this time. [facility] instruction on the prescription are 1 tablet daily. Patient takes 1/2 tablet every evening and has plenty at this time.    [facility] Direct Dial [phone]  Fax [phone] [email or web address]

## 2018-10-18 ENCOUNTER — Other Ambulatory Visit: Payer: Self-pay

## 2018-10-18 NOTE — Patient Outreach (Signed)
[facility]) Care Management  10/18/2018  Jessica Sherman 07-13-1931 [id number]   Medication Adherence call  Back from doctors office for Jessica Sherman regarding her Simvastatin  40 mg,patient is to stay on 1/2 tablet and doctor [facility] with [facility] spoke with Lovena Le at doctors office.   [facility] Direct Dial [phone]  Fax [phone] [name]

## 2018-10-18 NOTE — Telephone Encounter (Signed)
Routing to dr burns, please advise, I will call [facility] back, thanks

## 2018-10-18 NOTE — Telephone Encounter (Signed)
I have looked at through recent office notes and I do not see where she was told to take 1/2 of her cholesterol medication. Please advise just double checking.

## 2018-10-18 NOTE — Telephone Encounter (Signed)
Her cholesterol was very good in April 2019 -- if she was only taking 1/2 of a pill (20mg ) then it is ok to continue with only 20 mg daily --- if she was taking 40 mg then she should increase to the full pill daily.     Please update med list if needed.

## 2018-10-18 NOTE — Telephone Encounter (Signed)
Spoke with [facility] to give message below. Rx has been changed in pts chart.

## 2018-10-21 ENCOUNTER — Telehealth: Payer: Self-pay

## 2018-10-21 NOTE — Telephone Encounter (Signed)
Copied from [place] [id number]. Topic: General - Other >> Oct 18, 2018  5:40 PM Mcneil, Ja-Kwan wrote: Reason for CRM: Pt stated she had a missed call from the office so she was just returning the call. Pt requests call back. >> Oct 21, 2018  8:18 AM Morphies, Isidoro Donning wrote: Possible call from you?

## 2018-10-21 NOTE — Telephone Encounter (Signed)
LVM for pt to call back.

## 2018-10-23 ENCOUNTER — Other Ambulatory Visit: Payer: Self-pay

## 2018-10-23 MED ORDER — SIMVASTATIN 20 MG PO TABS: 20.0000 mg | ORAL_TABLET | Freq: Every day | ORAL | 1 refills | Status: DC

## 2018-10-23 NOTE — Addendum Note (Signed)
Addended by: [name] E on: 10/23/2018 02:59 PM   Modules accepted: Orders

## 2018-10-23 NOTE — Patient Outreach (Signed)
[facility]) Care Management  10/23/2018  CLORA OHMER Jan 22, 1931 [id number]   Medication Adherence call to Mrs. Thomasena Vandenheuvel  Follow up to doctor's office doctor(Burns) had not call in a new prescription stating new instruction on it spoke with front desk lady she took all the information and will be sending a new prescription to [facility] on Simvastatin 40 mg with new instructions of 1/2 tablet daily.    [place] Management Direct Dial [phone]  Fax [phone] [email or web address]

## 2018-10-23 NOTE — Telephone Encounter (Signed)
Jessica Sherman with [facility] is needing a new Rx sent to the pt's pharmacy reflecting this 20 mg dose. Can send 20 mg if you want instead of 40 mg..  Old Rx still states 1 /40 mg a day.  This needs to be changed at the pharmacy per [facility] [place], [address] AT [facility] [address] [phone] (Phone) [phone] (Fax)

## 2018-10-23 NOTE — Telephone Encounter (Signed)
New rx sent

## 2018-10-30 NOTE — Progress Notes (Signed)
Patient Care Team: [name], MD as PCP - General (Internal Medicine) [name], Charlestine Massed, NP as Nurse Practitioner (Hematology and Oncology) [name], MD as Consulting Physician (Hematology and Oncology) Bensimhon, Shaune Pascal, MD as Consulting Physician (Cardiology) [name], MD as Consulting Physician (General Surgery)  DIAGNOSIS:    ICD-10-CM   1. Malignant neoplasm of upper-outer quadrant of left breast in female, estrogen receptor negative ([place]) C50.412    Z17.1   2. Essential hypertension I10 amLODipine (NORVASC) 5 MG tablet    SUMMARY OF ONCOLOGIC HISTORY:   Breast cancer of upper-outer quadrant of left female breast ([place])   04/12/2016 Initial Diagnosis    Left breast biopsy: IDC grade 2-3, left axillary LN positive for metastatic IDC; mammogram revealed a 5 cm solid mass left breast 12:00 anterior depth, ER 0%, PR 0%, Ki-67 80%, HER-2 positive ratio 7.6, copy #16.5    05/10/2016 Imaging    Bone scan normal; CT CAP: left breast cancer, left axillary and subpectoral nodes are not pathologic by size criteriabut suspicious, no metastatic disease    05/16/2016 Breast MRI    Left breast solid with cystic appearance OUQ 1:00 crossing into the medial left breast 4.7 x 3.3 x 4.2 cm; second 68m nodule left breast UOQ: Posterior intramammary/low axillary lymph node, indeterminate left axillary lymph nodes, skin thickening    05/17/2016 - 08/29/2016 Neo-Adjuvant Chemotherapy    Taxol weekly 12 Herceptin and Perjeta every 3 weeks 6    09/05/2016 Breast MRI    Breast MRI: Complete Imaging response    10/18/2016 Surgery    Left modified radical mastectomy: Complete response to treatment 0/9 lymph nodes negative ypT0ypN0      CHIEF COMPLIANT: Surveillance of breast cancer  INTERVAL HISTORY: Jessica RENSTROMis a 85y.o. with above-mentioned history of HER2 positive left breast cancer treated with neoadjuvant chemotherapy, to which she had a complete pathologic  response, followed by left mastectomy. I last saw the patient one year ago. Her most recent mammogram on 04/18/18 showed no evidence of malignancy in the right breast. She presents to the clinic alone today. She denies pain or discomfort in her breasts. She exercises regularly and stays busy taking art lessons. She reviewed her medication list with me and requested a refill on her high blood pressure medication.   REVIEW OF SYSTEMS:   Constitutional: Denies fevers, chills or abnormal weight loss Eyes: Denies blurriness of vision Ears, nose, mouth, throat, and face: Denies mucositis or sore throat Respiratory: Denies cough, dyspnea or wheezes Cardiovascular: Denies palpitation, chest discomfort Gastrointestinal: Denies nausea, heartburn or change in bowel habits Skin: Denies abnormal skin rashes Lymphatics: Denies new lymphadenopathy or easy bruising Neurological: Denies numbness, tingling or new weaknesses Behavioral/Psych: Mood is stable, no new changes  Extremities: No lower extremity edema Breast: denies any pain or lumps or nodules in either breasts All other systems were reviewed with the patient and are negative.  I have reviewed the past medical history, past surgical history, social history and family history with the patient and they are unchanged from previous note.  ALLERGIES:  is allergic to other.  MEDICATIONS:  Current Outpatient Medications  Medication Sig Dispense Refill  . amLODipine (NORVASC) 5 MG tablet Take 1 tablet (5 mg total) by mouth daily. 90 tablet 3  . Calcium Carb-Cholecalciferol (CALCIUM 600+D3 PO) Take 1 tablet by mouth 2 (two) times daily.    . Cholecalciferol (VITAMIN D3) 1000 UNITS CAPS Take 1,000 Units by mouth daily.     .Marland Kitchen  levothyroxine (SYNTHROID, LEVOTHROID) 88 MCG tablet TAKE 1 TABLET BY MOUTH EVERY DAY EXCEPT ON WEDNESDAY TAKE 1/2 TABLET 90 tablet 1  . Multiple Vitamins-Minerals (CENTRUM SILVER PO) Take 1 tablet by mouth daily.     . Multiple  Vitamins-Minerals (PRESERVISION/LUTEIN) CAPS Take 1 capsule by mouth 2 (two) times daily.     . simvastatin (ZOCOR) 20 MG tablet Take 1 tablet (20 mg total) by mouth at bedtime. 90 tablet 1   No current facility-administered medications for this visit.     PHYSICAL EXAMINATION: ECOG PERFORMANCE STATUS: 1 - Symptomatic but completely ambulatory  Vitals:   10/31/18 1029  BP: (!) 153/79  Pulse: 65  Resp: 17  Temp: 98 F (36.7 C)  SpO2: 99%   Filed Weights   10/31/18 1029  Weight: 115 lb 12.8 oz (52.5 kg)    GENERAL: alert, no distress and comfortable SKIN: skin color, texture, turgor are normal, no rashes or significant lesions EYES: normal, Conjunctiva are pink and non-injected, sclera clear OROPHARYNX: no exudate, no erythema and lips, buccal mucosa, and tongue normal  NECK: supple, thyroid normal size, non-tender, without nodularity LYMPH: no palpable lymphadenopathy in the cervical, axillary or inguinal LUNGS: clear to auscultation and percussion with normal breathing effort HEART: regular rate & rhythm and no murmurs and no lower extremity edema ABDOMEN: abdomen soft, non-tender and normal bowel sounds MUSCULOSKELETAL: no cyanosis of digits and no clubbing  NEURO: alert & oriented x 3 with fluent speech, no focal motor/sensory deficits EXTREMITIES: No lower extremity edema BREAST: No palpable masses or nodules in either right breast or left chest wall. No palpable axillary supraclavicular or infraclavicular adenopathy no breast tenderness or nipple discharge. (exam performed in the presence of a chaperone)   LABORATORY DATA:  I have reviewed the data as listed CMP Latest Ref Rng & Units 06/19/2018 01/04/2018 11/01/2017  Glucose 70 - 99 mg/dL 95 94 85  BUN 6 - 23 mg/dL 22 25(H) 21  Creatinine 0.40 - 1.20 mg/dL 0.73 0.78 0.84  Sodium 135 - 145 mEq/L 137 140 138  Potassium 3.5 - 5.1 mEq/L 4.0 4.5 4.4  Chloride 96 - 112 mEq/L 100 100 101  CO2 19 - 32 mEq/L '31 30 29  ' Calcium  8.4 - 10.5 mg/dL 9.6 9.8 10.1  Total Protein 6.0 - 8.3 g/dL 7.3 7.4 7.3  Total Bilirubin 0.2 - 1.2 mg/dL 0.3 0.5 0.3  Alkaline Phos 39 - 117 U/L 61 62 72  AST 0 - 37 U/L '18 22 20  ' ALT 0 - 35 U/L '16 21 19    ' Lab Results  Component Value Date   WBC 7.7 01/04/2018   HGB 12.8 01/04/2018   HCT 38.4 01/04/2018   MCV 96.1 01/04/2018   PLT 265.0 01/04/2018   NEUTROABS 3.6 01/04/2018    ASSESSMENT & PLAN:  Breast cancer of upper-outer quadrant of left female breast ([place]) Left breast biopsy 04/12/2016: IDC grade 2-3, left axillary LN positive for metastatic IDC; mammogram revealed a 5 cm solid mass left breast 12:00 anterior depth. ER 0%, PR 0%, Ki-67 80%, HER-2 positive ratio 7.86, gene copy #16.5  Breast MRI: 05/16/2016:Left breast solid with cystic appearance OUQ 1:00 crossing into the medial left breast 4.7 x 3.3 x 4.2 cm; second 43m nodule left breast UOQ: Posterior intramammary/low axillary lymph node, indeterminate left axillary lymph nodes, skin thickening Bone scan and CT chest abdomen pelvis 05/10/2016: No metastatic disease Clinical stage: T2 N1 M0 stage IIB  Left modified radical mastectomy 10/18/2016: Complete response  to treatment 0/9 lymph nodes negative ypT0ypN0  Treatment summary: 1. Neoadjuvant chemotherapy with Taxol Herceptin Perjeta. Taxol to be given weekly 12, Herceptin Perjeta to be given every 3 weeks 6 cycles 05/17/16 to 08/29/16 2. Mastectomy with sentinel lymph node biopsy/selective axillary node dissection 10/18/2016: Complete pathologic response 3. Decided against doing radiation given her advanced age. 4. Maintenance Herceptin Perjeta every 3 weeks completed end of August 2018 ------------------------------------------------------------------------------------------------------------------- Surveillance: 1.  Breast exam 10/31/2018 benign 2. Mammogram  04/18/2018 at [facility]: No evidence of malignancy, breast density category B  Return to clinic in 1 year for  follow-up  No orders of the defined types were placed in this encounter.  The patient has a good understanding of the overall plan. she agrees with it. she will call with any problems that may develop before the next visit here.  [name], MD 10/31/2018  Julious Oka Dorshimer am acting as scribe for Dr. [name].  I have reviewed the above documentation for accuracy and completeness, and I agree with the above.

## 2018-10-31 ENCOUNTER — Telehealth: Payer: Self-pay | Admitting: Hematology and Oncology

## 2018-10-31 ENCOUNTER — Inpatient Hospital Stay: Payer: PPO | Attending: Hematology and Oncology | Admitting: Hematology and Oncology

## 2018-10-31 DIAGNOSIS — Z79899 Other long term (current) drug therapy: Secondary | ICD-10-CM | POA: Diagnosis not present

## 2018-10-31 DIAGNOSIS — Z9012 Acquired absence of left breast and nipple: Secondary | ICD-10-CM | POA: Diagnosis not present

## 2018-10-31 DIAGNOSIS — C50412 Malignant neoplasm of upper-outer quadrant of left female breast: Secondary | ICD-10-CM | POA: Diagnosis not present

## 2018-10-31 DIAGNOSIS — I10 Essential (primary) hypertension: Secondary | ICD-10-CM | POA: Diagnosis not present

## 2018-10-31 DIAGNOSIS — C779 Secondary and unspecified malignant neoplasm of lymph node, unspecified: Secondary | ICD-10-CM | POA: Diagnosis not present

## 2018-10-31 DIAGNOSIS — Z171 Estrogen receptor negative status [ER-]: Secondary | ICD-10-CM | POA: Insufficient documentation

## 2018-10-31 DIAGNOSIS — Z9221 Personal history of antineoplastic chemotherapy: Secondary | ICD-10-CM

## 2018-10-31 MED ORDER — AMLODIPINE BESYLATE 5 MG PO TABS: 5.0000 mg | ORAL_TABLET | Freq: Every day | ORAL | 3 refills | Status: DC

## 2018-10-31 NOTE — Telephone Encounter (Signed)
Gave avs and calendar ° °

## 2018-10-31 NOTE — Assessment & Plan Note (Signed)
Left breast biopsy 04/12/2016: IDC grade 2-3, left axillary LN positive for metastatic IDC; mammogram revealed a 5 cm solid mass left breast 12:00 anterior depth. ER 0%, PR 0%, Ki-67 80%, HER-2 positive ratio 7.86, gene copy #16.5  Breast MRI: 05/16/2016:Left breast solid with cystic appearance OUQ 1:00 crossing into the medial left breast 4.7 x 3.3 x 4.2 cm; second 32m nodule left breast UOQ: Posterior intramammary/low axillary lymph node, indeterminate left axillary lymph nodes, skin thickening Bone scan and CT chest abdomen pelvis 05/10/2016: No metastatic disease Clinical stage: T2 N1 M0 stage IIB  Left modified radical mastectomy 10/18/2016: Complete response to treatment 0/9 lymph nodes negative ypT0ypN0  Treatment summary: 1. Neoadjuvant chemotherapy with Taxol Herceptin Perjeta. Taxol to be given weekly 12, Herceptin Perjeta to be given every 3 weeks 6 cycles 05/17/16 to 08/29/16 2. Mastectomy with sentinel lymph node biopsy/selective axillary node dissection 10/18/2016: Complete pathologic response 3. Decided against doing radiation given her advanced age. 4. Maintenance Herceptin Perjeta every 3 weeks completed end of August 2018 ------------------------------------------------------------------------------------------------------------------- Surveillance: 1.  Breast exam 10/31/2018 benign 2. [facility] No evidence of malignancy, breast density category B  [place]  Return to clinic in 1 year for follow-up

## 2018-11-08 DIAGNOSIS — H6123 Impacted cerumen, bilateral: Secondary | ICD-10-CM | POA: Diagnosis not present

## 2018-12-17 ENCOUNTER — Telehealth: Payer: Self-pay

## 2018-12-17 NOTE — Telephone Encounter (Signed)
Since she can not do virtual. Is there another way you would like to do a visit with her next Monday. It was for a follow up. Did you want her to reschedule if she does not have any concerns?

## 2018-12-17 NOTE — Telephone Encounter (Signed)
Copied from [place] [id number]. Topic: General - Other >> Dec 17, 2018 11:56 AM Jodie Echevaria wrote: Reason for CRM: Patient called back to speak with Lovena Le said that she left a message but when trying to call phone going straight to VM. Please call patient at [phone] >> Dec 17, 2018 12:02 PM Morey Hummingbird wrote: Spoke to patient and she does not have the ability to do a virtual visit.

## 2018-12-17 NOTE — Telephone Encounter (Signed)
Tried calling pt to get her to reschedule appointment for 4/13. LVM for her to call back.

## 2018-12-17 NOTE — Telephone Encounter (Signed)
Reschedule if no concerns

## 2018-12-18 ENCOUNTER — Ambulatory Visit: Payer: PPO | Admitting: Internal Medicine

## 2018-12-23 ENCOUNTER — Ambulatory Visit: Payer: PPO | Admitting: Internal Medicine

## 2019-01-26 NOTE — Progress Notes (Signed)
Subjective:    Patient ID: Jessica Sherman, female    DOB: 04-13-1931, 85 y.o.   MRN: [id number]  HPI The patient is here for follow up.  She is exercising regularly does stairs at home, has a pool and will swim.     Hypertension: She is taking her medication daily. She is compliant with a low sodium diet.  She denies chest pain, palpitations, edema, shortness of breath and regular headaches. She does not monitor her blood pressure at home.    Hypothyroidism:  She is taking her medication daily.  She denies any recent changes in energy or weight that are unexplained.   Hyperlipidemia: She is taking her medication daily. She is compliant with a low fat/cholesterol diet. She denies myalgias.   Prediabetes:  She is compliant with a low sugar/carbohydrate diet.  She is exercising regularly.    Dysphagia:  When she goes goes to swallow pills it feels like it gets stuck in her trhoat.  It occurs with food at times.  It does not occur with liquids. It start about one month ago.  It is intermittent.  She denies neck pain.  She does not feel like it is getting worse.  She generally chews her food very thoroughly.    Medications and allergies reviewed with patient and updated if appropriate.  Patient Active Problem List   Diagnosis Date Noted  . Hypertension 12/13/2016  . S/P mastectomy, left 10/18/2016  . Breast cancer ([place]) 10/18/2016  . Port catheter in place 05/23/2016  . Breast cancer of upper-outer quadrant of left female breast ([place]) 04/30/2016  . Conductive hearing loss in right ear 12/09/2015  . Enteritis 05/30/2013  . Prediabetes   . Macular degeneration, dry   . Vertigo   . Vitamin D deficiency 11/17/2008  . COLONIC POLYPS, HX OF 11/17/2008  . Hypothyroidism 11/19/2007  . HYPERLIPIDEMIA 11/19/2007  . Osteopenia 02/18/2007    Current Outpatient Medications on File Prior to Visit  Medication Sig Dispense Refill  . amLODipine (NORVASC) 5 MG tablet Take 1 tablet (5 mg  total) by mouth daily. 90 tablet 3  . Calcium Carb-Cholecalciferol (CALCIUM 600+D3 PO) Take 1 tablet by mouth 2 (two) times daily.    . Cholecalciferol (VITAMIN D3) 1000 UNITS CAPS Take 1,000 Units by mouth daily.     Marland Kitchen levothyroxine (SYNTHROID, LEVOTHROID) 88 MCG tablet TAKE 1 TABLET BY MOUTH EVERY DAY EXCEPT ON WEDNESDAY TAKE 1/2 TABLET 90 tablet 1  . Multiple Vitamins-Minerals (CENTRUM SILVER PO) Take 1 tablet by mouth daily.     . Multiple Vitamins-Minerals (PRESERVISION/LUTEIN) CAPS Take 1 capsule by mouth 2 (two) times daily.     . simvastatin (ZOCOR) 20 MG tablet Take 1 tablet (20 mg total) by mouth at bedtime. 90 tablet 1  . [DISCONTINUED] prochlorperazine (COMPAZINE) 10 MG tablet Take 1 tablet (10 mg total) by mouth every 6 (six) hours as needed (Nausea or vomiting). (Patient not taking: Reported on 07/27/2016) 30 tablet 1   No current facility-administered medications on file prior to visit.     Past Medical History:  Diagnosis Date  . Hx of colonic polyps   . Hyperlipidemia   . Hypothyroidism   . Macular degeneration, dry   . Osteopenia    spine BMD T score - 1.69  . PONV (postoperative nausea and vomiting)    pt has vertigo and can not lie flat  . Prediabetes    FBS 136  on 08/29/2010; A1c 6.2%  . Vertigo  Past Surgical History:  Procedure Laterality Date  . CATARACT EXTRACTION  2009   OS  . COLONOSCOPY W/ POLYPECTOMY     X 2; last 2012. Dr Watt Climes  . IR GENERIC HISTORICAL  05/09/2016   IR US GUIDE VASC ACCESS RIGHT 05/09/2016 Jacqulynn Cadet, MD [facility]  . IR GENERIC HISTORICAL  05/09/2016   IR FLUORO GUIDE CV LINE RIGHT 05/09/2016 Jacqulynn Cadet, MD [facility]  . MODIFIED MASTECTOMY Left 10/18/2016   Procedure: LEFT MODIFIED RADICAL MASTECTOMY;  Surgeon: [name], MD;  Location: [facility];  Service: General;  Laterality: Left;  . PILONIDAL CYST EXCISION     X 3  . TONSILLECTOMY AND ADENOIDECTOMY    . TOTAL ABDOMINAL HYSTERECTOMY      w/ bladder repair    Social History   Socioeconomic History  . Marital status: Widowed    Spouse name: Not on file  . Number of children: Not on file  . Years of education: Not on file  . Highest education level: Not on file  Occupational History  . Occupation: retired  Scientific laboratory technician  . Financial resource strain: Not on file  . Food insecurity:    Worry: Not on file    Inability: Not on file  . Transportation needs:    Medical: Not on file    Non-medical: Not on file  Tobacco Use  . Smoking status: Never Smoker  . Smokeless tobacco: Never Used  Substance and Sexual Activity  . Alcohol use: No    Comment:    . Drug use: No  . Sexual activity: Not on file  Lifestyle  . Physical activity:    Days per week: Not on file    Minutes per session: Not on file  . Stress: Not on file  Relationships  . Social connections:    Talks on phone: Not on file    Gets together: Not on file    Attends religious service: Not on file    Active member of club or organization: Not on file    Attends meetings of clubs or organizations: Not on file    Relationship status: Not on file  Other Topics Concern  . Not on file  Social History Narrative   Regular exercise- yes 5mpd w/o symptoms    Family History  Problem Relation Age of Onset  . Osteoporosis Mother   . Leukemia Mother   . Cancer Maternal Uncle        ? stomach  . Aneurysm Father        AAA  . Heart attack Neg Hx     Review of Systems  Constitutional: Negative for chills and fever.  HENT: Positive for trouble swallowing. Negative for sore throat.   Respiratory: Negative for cough, shortness of breath and wheezing.   Cardiovascular: Negative for chest pain, palpitations and leg swelling.  Gastrointestinal: Negative for abdominal pain and nausea.       No gerd  Neurological: Negative for light-headedness and headaches.       Objective:   Vitals:   01/27/19 1347  BP: (!) 172/66  Pulse: 68  Resp: 16  Temp: 98.4  F (36.9 C)  SpO2: 98%   BP Readings from Last 3 Encounters:  01/27/19 (!) 172/66  10/31/18 (!) 153/79  06/19/18 (!) 156/68   Wt Readings from Last 3 Encounters:  01/27/19 113 lb (51.3 kg)  10/31/18 115 lb 12.8 oz (52.5 kg)  06/19/18 119 lb (54 kg)   Body mass  index is 20.02 kg/m.   Physical Exam    Constitutional: Appears thin, but well-developed. No distress.  HENT:  Head: Normocephalic and atraumatic.  Neck: Neck supple. No tracheal deviation present. No thyromegaly present.  No cervical lymphadenopathy Cardiovascular: Normal rate, regular rhythm and normal heart sounds.   No murmur heard. No carotid bruit .  No edema Pulmonary/Chest: Effort normal and breath sounds normal. No respiratory distress. No has no wheezes. No rales.  Skin: Skin is warm and dry. Not diaphoretic.  Psychiatric: Normal mood and affect. Behavior is normal.      Assessment & Plan:    See Problem List for Assessment and Plan of chronic medical problems.

## 2019-01-27 ENCOUNTER — Other Ambulatory Visit: Payer: Self-pay

## 2019-01-27 ENCOUNTER — Encounter: Payer: Self-pay | Admitting: Internal Medicine

## 2019-01-27 ENCOUNTER — Other Ambulatory Visit (INDEPENDENT_AMBULATORY_CARE_PROVIDER_SITE_OTHER): Payer: PPO

## 2019-01-27 ENCOUNTER — Ambulatory Visit (INDEPENDENT_AMBULATORY_CARE_PROVIDER_SITE_OTHER): Payer: PPO | Admitting: Internal Medicine

## 2019-01-27 VITALS — BP 172/66 | HR 68 | Temp 98.4°F | Resp 16 | Ht 63.0 in | Wt 113.0 lb

## 2019-01-27 DIAGNOSIS — R4702 Dysphasia: Secondary | ICD-10-CM | POA: Insufficient documentation

## 2019-01-27 DIAGNOSIS — E039 Hypothyroidism, unspecified: Secondary | ICD-10-CM

## 2019-01-27 DIAGNOSIS — I10 Essential (primary) hypertension: Secondary | ICD-10-CM

## 2019-01-27 DIAGNOSIS — E782 Mixed hyperlipidemia: Secondary | ICD-10-CM

## 2019-01-27 DIAGNOSIS — R7303 Prediabetes: Secondary | ICD-10-CM

## 2019-01-27 LAB — LIPID PANEL
Cholesterol: 135 mg/dL (ref 0–200)
HDL: 51 mg/dL (ref >39.00)
NonHDL: 83.99
Total CHOL/HDL Ratio: 3
Triglycerides: 204 mg/dL — ABNORMAL HIGH (ref 0.0–149.0)
VLDL: 40.8 mg/dL — ABNORMAL HIGH (ref 0.0–40.0)

## 2019-01-27 LAB — TSH: TSH: 0.47 u[IU]/mL (ref 0.35–4.50)

## 2019-01-27 LAB — CBC WITH DIFFERENTIAL/PLATELET
Basophils Absolute: 0.1 10*3/uL (ref 0.0–0.1)
Basophils Relative: 0.8 % (ref 0.0–3.0)
Eosinophils Absolute: 0.2 10*3/uL (ref 0.0–0.7)
Eosinophils Relative: 1.6 % (ref 0.0–5.0)
HCT: 34.3 % — ABNORMAL LOW (ref 36.0–46.0)
Hemoglobin: 12 g/dL (ref 12.0–15.0)
Lymphocytes Relative: 20.6 % (ref 12.0–46.0)
Lymphs Abs: 1.9 10*3/uL (ref 0.7–4.0)
MCHC: 34.9 g/dL (ref 30.0–36.0)
MCV: 99.4 fl (ref 78.0–100.0)
Monocytes Absolute: 1.1 10*3/uL — ABNORMAL HIGH (ref 0.1–1.0)
Monocytes Relative: 12 % (ref 3.0–12.0)
Neutro Abs: 6 10*3/uL (ref 1.4–7.7)
Neutrophils Relative %: 65 % (ref 43.0–77.0)
Platelets: 270 10*3/uL (ref 150.0–400.0)
RBC: 3.45 Mil/uL — ABNORMAL LOW (ref 3.87–5.11)
RDW: 12.1 % (ref 11.5–15.5)
WBC: 9.3 10*3/uL (ref 4.0–10.5)

## 2019-01-27 LAB — HEMOGLOBIN A1C: Hgb A1c MFr Bld: 6.2 % (ref 4.6–6.5)

## 2019-01-27 LAB — COMPREHENSIVE METABOLIC PANEL
ALT: 13 U/L (ref 0–35)
AST: 16 U/L (ref 0–37)
Albumin: 4.1 g/dL (ref 3.5–5.2)
Alkaline Phosphatase: 66 U/L (ref 39–117)
BUN: 19 mg/dL (ref 6–23)
CO2: 29 mEq/L (ref 19–32)
Calcium: 9.4 mg/dL (ref 8.4–10.5)
Chloride: 97 mEq/L (ref 96–112)
Creatinine, Ser: 0.82 mg/dL (ref 0.40–1.20)
GFR: 66.04 mL/min (ref >60.00)
Glucose, Bld: 124 mg/dL — ABNORMAL HIGH (ref 70–99)
Potassium: 4.2 mEq/L (ref 3.5–5.1)
Sodium: 134 mEq/L — ABNORMAL LOW (ref 135–145)
Total Bilirubin: 0.4 mg/dL (ref 0.2–1.2)
Total Protein: 7.4 g/dL (ref 6.0–8.3)

## 2019-01-27 LAB — LDL CHOLESTEROL, DIRECT: Direct LDL: 57 mg/dL

## 2019-01-27 NOTE — Assessment & Plan Note (Signed)
She has not checked her BP at home - she thinks she has white coat htn Advised to monitor BP at home Continue current medications cmp

## 2019-01-27 NOTE — Assessment & Plan Note (Signed)
Check lipid panel  Continue daily statin Regular exercise and healthy diet encouraged  

## 2019-01-27 NOTE — Assessment & Plan Note (Signed)
For the past month she has had intermittent dysphasia with pills and food, not with liquids this She does not feel this is getting worse She feels the food or pills gets stuck in her throat, not lower down in her esophagus She does tend to chew fairly thoroughly No GERD Discuss further evaluation-for now she will just monitor and let me know if this persists or worsens so that we can evaluate further

## 2019-01-27 NOTE — Assessment & Plan Note (Signed)
Check a1c Low sugar / carb diet Stressed regular exercise   

## 2019-01-27 NOTE — Patient Instructions (Signed)
  Tests ordered today. Your results will be released to MyChart (or called to you) after review, usually within 72hours after test completion. If any changes need to be made, you will be notified at that same time.  Medications reviewed and updated.  Changes include :   none      Please followup in 6 months   

## 2019-01-27 NOTE — Assessment & Plan Note (Signed)
Clinically euthyroid Check tsh  Titrate med dose if needed  

## 2019-02-22 ENCOUNTER — Other Ambulatory Visit: Payer: Self-pay | Admitting: Internal Medicine

## 2019-03-13 ENCOUNTER — Other Ambulatory Visit (INDEPENDENT_AMBULATORY_CARE_PROVIDER_SITE_OTHER): Payer: PPO

## 2019-03-13 ENCOUNTER — Ambulatory Visit (INDEPENDENT_AMBULATORY_CARE_PROVIDER_SITE_OTHER)
Admission: RE | Admit: 2019-03-13 | Discharge: 2019-03-13 | Disposition: A | Discharge status: Home / Self Care | Payer: PPO | Source: Ambulatory Visit | Attending: Internal Medicine | Admitting: Internal Medicine

## 2019-03-13 ENCOUNTER — Encounter: Payer: Self-pay | Admitting: Internal Medicine

## 2019-03-13 ENCOUNTER — Other Ambulatory Visit: Payer: Self-pay

## 2019-03-13 ENCOUNTER — Ambulatory Visit (INDEPENDENT_AMBULATORY_CARE_PROVIDER_SITE_OTHER): Payer: PPO | Admitting: Internal Medicine

## 2019-03-13 VITALS — BP 164/78 | HR 71 | Temp 98.2°F | Resp 16 | Ht 63.0 in | Wt 108.4 lb

## 2019-03-13 DIAGNOSIS — R1312 Dysphagia, oropharyngeal phase: Secondary | ICD-10-CM | POA: Diagnosis not present

## 2019-03-13 DIAGNOSIS — J370 Chronic laryngitis: Secondary | ICD-10-CM

## 2019-03-13 DIAGNOSIS — K149 Disease of tongue, unspecified: Secondary | ICD-10-CM | POA: Diagnosis not present

## 2019-03-13 DIAGNOSIS — R590 Localized enlarged lymph nodes: Secondary | ICD-10-CM | POA: Diagnosis not present

## 2019-03-13 DIAGNOSIS — I10 Essential (primary) hypertension: Secondary | ICD-10-CM

## 2019-03-13 LAB — BUN: BUN: 16 mg/dL (ref 6–23)

## 2019-03-13 LAB — CREATININE, SERUM: Creatinine, Ser: 0.71 mg/dL (ref 0.40–1.20)

## 2019-03-13 MED ORDER — IOHEXOL 300 MG/ML  SOLN
75.0000 mL | Freq: Once | INTRAMUSCULAR | Status: AC | PRN
  Administered 2019-03-13: 75 mL via INTRAVENOUS

## 2019-03-13 NOTE — Progress Notes (Signed)
Subjective:    Patient ID: Jessica Sherman, female    DOB: [birth year], 85 y.o.   MRN: [id number]  HPI The patient is here for follow up.    Dysphagia:  She has difficulty swallowing -pills and food are hard to swallow.  She can drink liquids.  She feels the pills or food get stuck in her lower throat on the right side.  She can feel that there is something down there.  The right lower neck feels weird.   She is hungry and wants to eat, but it is hard to eat.  She has been drinking strawberry milkshakes.  She can eat some soft foods.    This morning she coughed up some coffee looking material.  She is loosing weight.  She has hoarseness.   Today she had an episode of sweating.    She will see Dr Redmond Baseman on 7/14.     Medications and allergies reviewed with patient and updated if appropriate.  Patient Active Problem List   Diagnosis Date Noted  . Dysphasia 01/27/2019  . Hypertension 12/13/2016  . S/P mastectomy, left 10/18/2016  . Breast cancer ([place]) 10/18/2016  . Port catheter in place 05/23/2016  . Breast cancer of upper-outer quadrant of left female breast ([place]) 04/30/2016  . Conductive hearing loss in right ear 12/09/2015  . Enteritis 05/30/2013  . Prediabetes   . Macular degeneration, dry   . Vertigo   . Vitamin D deficiency 11/17/2008  . COLONIC POLYPS, HX OF 11/17/2008  . Hypothyroidism 11/19/2007  . HYPERLIPIDEMIA 11/19/2007  . Osteopenia 02/18/2007    Current Outpatient Medications on File Prior to Visit  Medication Sig Dispense Refill  . amLODipine (NORVASC) 5 MG tablet Take 1 tablet (5 mg total) by mouth daily. 90 tablet 3  . Calcium Carb-Cholecalciferol (CALCIUM 600+D3 PO) Take 1 tablet by mouth 2 (two) times daily.    . Cholecalciferol (VITAMIN D3) 1000 UNITS CAPS Take 1,000 Units by mouth daily.     Marland Kitchen levothyroxine (SYNTHROID) 88 MCG tablet TAKE 1 TABLET BY MOUTH EVERY DAY EXCEPT ON WEDNESDAY TAKE 1/2 TABLET 90 tablet 1  . Multiple Vitamins-Minerals (CENTRUM  SILVER PO) Take 1 tablet by mouth daily.     . Multiple Vitamins-Minerals (PRESERVISION/LUTEIN) CAPS Take 1 capsule by mouth 2 (two) times daily.     . simvastatin (ZOCOR) 20 MG tablet Take 1 tablet (20 mg total) by mouth at bedtime. 90 tablet 1  . [DISCONTINUED] prochlorperazine (COMPAZINE) 10 MG tablet Take 1 tablet (10 mg total) by mouth every 6 (six) hours as needed (Nausea or vomiting). (Patient not taking: Reported on 07/27/2016) 30 tablet 1   No current facility-administered medications on file prior to visit.     Past Medical History:  Diagnosis Date  . Hx of colonic polyps   . Hyperlipidemia   . Hypothyroidism   . Macular degeneration, dry   . Osteopenia    spine BMD T score - 1.69  . PONV (postoperative nausea and vomiting)    pt has vertigo and can not lie flat  . Prediabetes    FBS 136  on 08/29/2010; A1c 6.2%  . Vertigo     Past Surgical History:  Procedure Laterality Date  . CATARACT EXTRACTION  2009   OS  . COLONOSCOPY W/ POLYPECTOMY     X 2; last 2012. Dr Watt Climes  . IR GENERIC HISTORICAL  05/09/2016   IR US GUIDE VASC ACCESS RIGHT 05/09/2016 Jacqulynn Cadet, MD [facility]  .  IR GENERIC HISTORICAL  05/09/2016   IR FLUORO GUIDE CV LINE RIGHT 05/09/2016 Jacqulynn Cadet, MD [facility]  . MODIFIED MASTECTOMY Left 10/18/2016   Procedure: LEFT MODIFIED RADICAL MASTECTOMY;  Surgeon: [name], MD;  Location: [facility];  Service: General;  Laterality: Left;  . PILONIDAL CYST EXCISION     X 3  . TONSILLECTOMY AND ADENOIDECTOMY    . TOTAL ABDOMINAL HYSTERECTOMY     w/ bladder repair    Social History   Socioeconomic History  . Marital status: Widowed    Spouse name: Not on file  . Number of children: Not on file  . Years of education: Not on file  . Highest education level: Not on file  Occupational History  . Occupation: retired  Scientific laboratory technician  . Financial resource strain: Not on file  . Food insecurity    Worry: Not on file     Inability: Not on file  . Transportation needs    Medical: Not on file    Non-medical: Not on file  Tobacco Use  . Smoking status: Never Smoker  . Smokeless tobacco: Never Used  Substance and Sexual Activity  . Alcohol use: No    Comment:    . Drug use: No  . Sexual activity: Not on file  Lifestyle  . Physical activity    Days per week: Not on file    Minutes per session: Not on file  . Stress: Not on file  Relationships  . Social Herbalist on phone: Not on file    Gets together: Not on file    Attends religious service: Not on file    Active member of club or organization: Not on file    Attends meetings of clubs or organizations: Not on file    Relationship status: Not on file  Other Topics Concern  . Not on file  Social History Narrative   Regular exercise- yes 79mpd w/o symptoms    Family History  Problem Relation Age of Onset  . Osteoporosis Mother   . Leukemia Mother   . Cancer Maternal Uncle        ? stomach  . Aneurysm Father        AAA  . Heart attack Neg Hx     Review of Systems  Constitutional: Positive for diaphoresis (this morning) and unexpected weight change. Negative for chills and fever.  HENT: Positive for trouble swallowing and voice change (rapsy). Negative for congestion, ear pain (can feel minimal discomfort in right ear), sinus pain and sore throat.   Respiratory: Negative for cough, shortness of breath and wheezing.   Gastrointestinal: Negative for abdominal pain and nausea.  Neurological: Negative for headaches.       Objective:   Vitals:   03/13/19 1116  BP: (!) 164/78  Pulse: 71  Resp: 16  Temp: 98.2 F (36.8 C)  SpO2: 99%   BP Readings from Last 3 Encounters:  03/13/19 (!) 164/78  01/27/19 (!) 172/66  10/31/18 (!) 153/79   Wt Readings from Last 3 Encounters:  03/13/19 108 lb 6.4 oz (49.2 kg)  01/27/19 113 lb (51.3 kg)  10/31/18 115 lb 12.8 oz (52.5 kg)   Body mass index is 19.2 kg/m.   Physical Exam     Constitutional: Appears well-developed and well-nourished. No distress.  HENT:  Head: Normocephalic and atraumatic.  Neck: Neck supple. No tracheal deviation present. No thyromegaly present.  No cervical , occipital, auricular or supraclavicular lymphadenpathy Cardiovascular:  Normal rate, regular rhythm and normal heart sounds.  No murmur heard. No carotid bruit .  No edema Pulmonary/Chest: Effort normal and breath sounds normal. No respiratory distress. No has no wheezes. No rales.  Skin: Skin is warm and dry. Not diaphoretic.  Psychiatric: Normal mood and affect. Behavior is normal.      Assessment & Plan:    See Problem List for Assessment and Plan of chronic medical problems.

## 2019-03-13 NOTE — Patient Instructions (Addendum)
Have blood work done today.    Go for the CT scan at [facility] at [address].  Be there at 2:15.  Drink plenty of water between now and then, but no solid food until after the test.      We will call you with the results.

## 2019-03-14 DIAGNOSIS — R1312 Dysphagia, oropharyngeal phase: Secondary | ICD-10-CM | POA: Insufficient documentation

## 2019-03-14 NOTE — Assessment & Plan Note (Signed)
Having dysphagia in her lower right pharyngeal area - it has been going on for a while and getting worse Has associated hoarseness, weight loss, episode of hemoptysis, feeling of something in her throat Symptoms concerning for cancer Has ENT appt on 7/14  Will order a CT of her neck to see if that will reveal a cause - to be done today Continue soft foods, ensure, boost, milkshakes

## 2019-03-14 NOTE — Assessment & Plan Note (Signed)
She is very anxious which is why her BP is so high here today -- just monitor  - no change in medications

## 2019-03-17 ENCOUNTER — Telehealth: Payer: Self-pay | Admitting: Internal Medicine

## 2019-03-17 ENCOUNTER — Other Ambulatory Visit: Payer: Self-pay | Admitting: Internal Medicine

## 2019-03-17 DIAGNOSIS — D3702 Neoplasm of uncertain behavior of tongue: Secondary | ICD-10-CM

## 2019-03-17 MED ORDER — DIAZEPAM 5 MG PO TABS: ORAL_TABLET | ORAL | 0 refills | Status: DC

## 2019-03-17 NOTE — Telephone Encounter (Signed)
Pt aware.

## 2019-03-17 NOTE — Telephone Encounter (Signed)
Please call her - let her know Dr Geralyn Flash office will be calling her today to get her in this week.   I also ordered a PET scan and someone will call her to schedule this.   I called Dr Redmond Baseman and let him know what was going on as well.

## 2019-03-17 NOTE — Telephone Encounter (Signed)
Sent to pof 

## 2019-03-17 NOTE — Telephone Encounter (Signed)
Patient's daughter and Medical POA, Jessica Sherman, calling and states that the patient has a PET scan scheduled for Wednesday 03/19/2019 and is very claustrophobic. Was wondering if Dr Quay Burow could prescribe something for her to take before the scan? Please advise.  [id number] - [address], [address]  CB#: [phone]

## 2019-03-18 ENCOUNTER — Telehealth: Payer: Self-pay

## 2019-03-18 ENCOUNTER — Telehealth: Payer: Self-pay | Admitting: Internal Medicine

## 2019-03-18 NOTE — Telephone Encounter (Signed)
Ok to take thyroid medication tomorrow.

## 2019-03-18 NOTE — Telephone Encounter (Signed)
error 

## 2019-03-18 NOTE — Telephone Encounter (Signed)
Pt is having pet scan tomorrow and would like to know if she can take her levothyroxine tomorrow

## 2019-03-18 NOTE — Telephone Encounter (Signed)
Called patient and informed

## 2019-03-18 NOTE — Telephone Encounter (Signed)
Daughter aware.

## 2019-03-19 ENCOUNTER — Ambulatory Visit (HOSPITAL_COMMUNITY)
Admission: RE | Admit: 2019-03-19 | Discharge: 2019-03-19 | Disposition: A | Discharge status: Home / Self Care | Payer: PPO | Source: Ambulatory Visit | Attending: Internal Medicine | Admitting: Internal Medicine

## 2019-03-19 ENCOUNTER — Other Ambulatory Visit: Payer: Self-pay

## 2019-03-19 ENCOUNTER — Other Ambulatory Visit: Payer: Self-pay | Admitting: Internal Medicine

## 2019-03-19 DIAGNOSIS — E785 Hyperlipidemia, unspecified: Secondary | ICD-10-CM | POA: Insufficient documentation

## 2019-03-19 DIAGNOSIS — Z853 Personal history of malignant neoplasm of breast: Secondary | ICD-10-CM | POA: Diagnosis not present

## 2019-03-19 DIAGNOSIS — Z79890 Hormone replacement therapy (postmenopausal): Secondary | ICD-10-CM | POA: Insufficient documentation

## 2019-03-19 DIAGNOSIS — D3702 Neoplasm of uncertain behavior of tongue: Secondary | ICD-10-CM

## 2019-03-19 DIAGNOSIS — R911 Solitary pulmonary nodule: Secondary | ICD-10-CM | POA: Insufficient documentation

## 2019-03-19 DIAGNOSIS — Z9012 Acquired absence of left breast and nipple: Secondary | ICD-10-CM | POA: Diagnosis not present

## 2019-03-19 DIAGNOSIS — R4702 Dysphasia: Secondary | ICD-10-CM | POA: Diagnosis not present

## 2019-03-19 DIAGNOSIS — E559 Vitamin D deficiency, unspecified: Secondary | ICD-10-CM | POA: Diagnosis not present

## 2019-03-19 DIAGNOSIS — C01 Malignant neoplasm of base of tongue: Secondary | ICD-10-CM | POA: Diagnosis not present

## 2019-03-19 DIAGNOSIS — I10 Essential (primary) hypertension: Secondary | ICD-10-CM | POA: Diagnosis not present

## 2019-03-19 DIAGNOSIS — Z79899 Other long term (current) drug therapy: Secondary | ICD-10-CM | POA: Diagnosis not present

## 2019-03-19 DIAGNOSIS — E039 Hypothyroidism, unspecified: Secondary | ICD-10-CM | POA: Insufficient documentation

## 2019-03-19 LAB — GLUCOSE, CAPILLARY: Glucose-Capillary: 120 mg/dL — ABNORMAL HIGH (ref 70–99)

## 2019-03-19 MED ORDER — FLUDEOXYGLUCOSE F - 18 (FDG) INJECTION
5.6700 | Freq: Once | INTRAVENOUS | Status: AC
  Administered 2019-03-19: 5.67 via INTRAVENOUS

## 2019-03-19 NOTE — Progress Notes (Signed)
Patient Care Team: [name], MD as PCP - General (Internal Medicine) [name], Charlestine Massed, NP as Nurse Practitioner (Hematology and Oncology) [name], MD as Consulting Physician (Hematology and Oncology) Bensimhon, Shaune Pascal, MD as Consulting Physician (Cardiology) [name], MD as Consulting Physician (General Surgery)  DIAGNOSIS:    ICD-10-CM   1. Malignant neoplasm of base of tongue [facility])  C01 Ambulatory referral to ENT  2. Malignant neoplasm of upper-outer quadrant of left breast in female, estrogen receptor negative ([facility])  C50.412    Z17.1     SUMMARY OF ONCOLOGIC HISTORY: Oncology History  Breast cancer of upper-outer quadrant of left female breast ([place])  04/12/2016 Initial Diagnosis   Left breast biopsy: IDC grade 2-3, left axillary LN positive for metastatic IDC; mammogram revealed a 5 cm solid mass left breast 12:00 anterior depth, ER 0%, PR 0%, Ki-67 80%, HER-2 positive ratio 7.6, copy #16.5   05/10/2016 Imaging   Bone scan normal; CT CAP: left breast cancer, left axillary and subpectoral nodes are not pathologic by size criteriabut suspicious, no metastatic disease   05/16/2016 Breast MRI   Left breast solid with cystic appearance OUQ 1:00 crossing into the medial left breast 4.7 x 3.3 x 4.2 cm; second 75m nodule left breast UOQ: Posterior intramammary/low axillary lymph node, indeterminate left axillary lymph nodes, skin thickening   05/17/2016 - 08/29/2016 Neo-Adjuvant Chemotherapy   Taxol weekly 12 Herceptin and Perjeta every 3 weeks 6   09/05/2016 Breast MRI   Breast MRI: Complete Imaging response   10/18/2016 Surgery   Left modified radical mastectomy: Complete response to treatment 0/9 lymph nodes negative [id number]    03/12/2019 Relapse/Recurrence   Patient had dysphagia and pain in neck. CT neck showed a large mass at the base of the tongue compatible with carcinoma and a 9 mm right level 2 lymph node which shows increased enhancement and is  suspicious for metastatic disease. PET scan showed the large right tongue base mass consistent with neoplasm, a 465mpulmonary nodule that was indeterminate. No evidence of metastases in the chest, abdomen or pelvis.     CHIEF COMPLIANT: Surveillance of breast cancer  INTERVAL HISTORY: Jessica HEHRs a [age].o. with above-mentioned history of HER2 positive left breast cancer treated with neoadjuvant chemotherapy, to which she had a complete pathologic response, followed by left mastectomy. CT neck on 03/13/19 after the patient had difficulty swallowing showed a large mass at the base of the tongue compatible with carcinoma and a 9 mm right level 2 lymph node which shows increased enhancement and is suspicious for metastatic disease. PET scan on 03/19/19 showed the large right tongue base mass consistent with neoplasm, a 12m89mulmonary nodule that was indeterminate, and no evidence of metastases in the chest, abdomen or pelvis. She presents to the clinic today with her daughter to discuss treatment options.   REVIEW OF SYSTEMS:   Constitutional: Denies fevers, chills or abnormal weight loss Eyes: Denies blurriness of vision Ears, nose, mouth, throat, and face: Denies mucositis or sore throat Respiratory: Denies cough, dyspnea or wheezes Cardiovascular: Denies palpitation, chest discomfort Gastrointestinal: Denies nausea, heartburn or change in bowel habits Skin: Denies abnormal skin rashes Lymphatics: Denies new lymphadenopathy or easy bruising Neurological: Denies numbness, tingling or new weaknesses Behavioral/Psych: Mood is stable, no new changes  Extremities: No lower extremity edema Breast: denies any pain or lumps or nodules in either breasts All other systems were reviewed with the patient and are negative.  I have reviewed  the past medical history, past surgical history, social history and family history with the patient and they are unchanged from previous note.  ALLERGIES:  is  allergic to other.  MEDICATIONS:  Current Outpatient Medications  Medication Sig Dispense Refill  . amLODipine (NORVASC) 5 MG tablet Take 1 tablet (5 mg total) by mouth daily. 90 tablet 3  . Calcium Carb-Cholecalciferol (CALCIUM 600+D3 PO) Take 1 tablet by mouth 2 (two) times daily.    . Cholecalciferol (VITAMIN D3) 1000 UNITS CAPS Take 1,000 Units by mouth daily.     . diazepam (VALIUM) 5 MG tablet Take one tab one hour prior to procedure, may repeat x 1 if needed 2 tablet 0  . levothyroxine (SYNTHROID) 88 MCG tablet TAKE 1 TABLET BY MOUTH EVERY DAY EXCEPT ON WEDNESDAY TAKE 1/2 TABLET 90 tablet 1  . multivitamin with minerals (CERTA-VITE) LIQD Take 5 mLs by mouth daily. 200 mL 6  . simvastatin (ZOCOR) 20 MG tablet Take 1 tablet (20 mg total) by mouth at bedtime. 90 tablet 1   No current facility-administered medications for this visit.     PHYSICAL EXAMINATION: ECOG PERFORMANCE STATUS: 1 - Symptomatic but completely ambulatory  Vitals:   03/20/19 1436  BP: (!) 171/58  Pulse: 76  Resp: 17  Temp: 98.9 F (37.2 C)  SpO2: 100%   Filed Weights   03/20/19 1436  Weight: 106 lb 1 oz (48.1 kg)    GENERAL: alert, no distress and comfortable SKIN: skin color, texture, turgor are normal, no rashes or significant lesions EYES: normal, Conjunctiva are pink and non-injected, sclera clear OROPHARYNX: no exudate, no erythema and lips, buccal mucosa, and tongue normal  NECK: supple, thyroid normal size, non-tender, without nodularity LYMPH: no palpable lymphadenopathy in the cervical, axillary or inguinal LUNGS: clear to auscultation and percussion with normal breathing effort HEART: regular rate & rhythm and no murmurs and no lower extremity edema ABDOMEN: abdomen soft, non-tender and normal bowel sounds MUSCULOSKELETAL: no cyanosis of digits and no clubbing  NEURO: alert & oriented x 3 with fluent speech, no focal motor/sensory deficits EXTREMITIES: No lower extremity edema  LABORATORY  DATA:  I have reviewed the data as listed CMP Latest Ref Rng & Units 03/13/2019 01/27/2019 06/19/2018  Glucose 70 - 99 mg/dL - 124(H) 95  BUN 6 - 23 mg/dL '16 19 22  ' Creatinine 0.40 - 1.20 mg/dL 0.71 0.82 0.73  Sodium 135 - 145 mEq/L - 134(L) 137  Potassium 3.5 - 5.1 mEq/L - 4.2 4.0  Chloride 96 - 112 mEq/L - 97 100  CO2 19 - 32 mEq/L - 29 31  Calcium 8.4 - 10.5 mg/dL - 9.4 9.6  Total Protein 6.0 - 8.3 g/dL - 7.4 7.3  Total Bilirubin 0.2 - 1.2 mg/dL - 0.4 0.3  Alkaline Phos 39 - 117 U/L - 66 61  AST 0 - 37 U/L - 16 18  ALT 0 - 35 U/L - 13 16    Lab Results  Component Value Date   WBC 9.3 01/27/2019   HGB 12.0 01/27/2019   HCT 34.3 (L) 01/27/2019   MCV 99.4 01/27/2019   PLT 270.0 01/27/2019   NEUTROABS 6.0 01/27/2019    ASSESSMENT & PLAN:  Breast cancer of upper-outer quadrant of left female breast ([facility]) Left breast biopsy 04/12/2016: IDC grade 2-3, left axillary LN positive for metastatic IDC; mammogram revealed a 5 cm solid mass left breast 12:00 anterior depth. ER 0%, PR 0%, Ki-67 80%, HER-2 positive ratio 7.86, gene copy #16.5  Breast MRI: 05/16/2016:Left breast solid with cystic appearance OUQ 1:00 crossing into the medial left breast 4.7 x 3.3 x 4.2 cm; second 36m nodule left breast UOQ: Posterior intramammary/low axillary lymph node, indeterminate left axillary lymph nodes, skin thickening Bone scan and CT chest abdomen pelvis 05/10/2016: No metastatic disease Clinical stage: T2 N1 M0 stage IIB  Left modified radical mastectomy 10/18/2016: Complete response to treatment 0/9 lymph nodes negative ypT0ypN0  Treatment summary: 1. Neoadjuvant chemotherapy with Taxol Herceptin Perjeta. Taxol to be given weekly 12, Herceptin Perjeta to be given every 3 weeks 6 cycles 05/17/16 to 08/29/16 2. Mastectomy with sentinel lymph node biopsy/selective axillary node dissection 10/18/2016: Complete pathologic response 3. Decided against doing radiation given her advanced age.  4.Maintenance Herceptin Perjeta every 3 weeks completed end of August 2018 ------------------------------------------------------------------------------------------------------------------- CT neck on 03/13/19 after the patient had difficulty swallowing showed a large mass at the base of the tongue compatible with carcinoma and a 9 mm right level 2 lymph node which shows increased enhancement and is suspicious for metastatic disease. PET scan on 03/19/19 showed the large right tongue base mass consistent with neoplasm, a 450mpulmonary nodule that was indeterminate, and no evidence of metastases in the chest, abdomen or pelvis.  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 Plan: 1.  Referral to Dr. RoConstance Holsteror biopsies and surgical options 2.  Referral to Dr. ZhMaylon Peppersor medical oncology consultation regarding head and neck primary Patient understands that the head and neck cancer team will be taking care of her needs and I will step aside for them to manage her. She understands that if surgery is not an option and radiation may be a potential option.  I called and discussed the case with Dr.Zhao who graciously accepted to follow the patient. Given the patient can make her own decisions, she will need her daughter to help her.  Orders Placed This Encounter  Procedures  . Ambulatory referral to ENT    Referral Priority:   Routine    Referral Type:   Consultation    Referral Reason:   Specialty Services Required    Referred to Provider:   RoIzora GalaMD    Requested Specialty:   Otolaryngology    Number of Visits Requested:   1   The patient has a good understanding of the overall plan. she agrees with it. she will call with any problems that may develop before the next visit here.  GuNicholas LoseMD 03/20/2019  I,Julious Okaorshimer am acting as scribe for Dr. ViNicholas Lose I have reviewed the above documentation for accuracy and completeness,  and I agree with the above.

## 2019-03-20 ENCOUNTER — Other Ambulatory Visit: Payer: Self-pay

## 2019-03-20 ENCOUNTER — Inpatient Hospital Stay: Payer: PPO | Attending: Hematology and Oncology | Admitting: Hematology and Oncology

## 2019-03-20 VITALS — BP 171/58 | HR 76 | Temp 98.9°F | Resp 17 | Ht 63.0 in | Wt 106.1 lb

## 2019-03-20 DIAGNOSIS — D72829 Elevated white blood cell count, unspecified: Secondary | ICD-10-CM | POA: Insufficient documentation

## 2019-03-20 DIAGNOSIS — C01 Malignant neoplasm of base of tongue: Secondary | ICD-10-CM | POA: Insufficient documentation

## 2019-03-20 DIAGNOSIS — D649 Anemia, unspecified: Secondary | ICD-10-CM | POA: Diagnosis not present

## 2019-03-20 DIAGNOSIS — E871 Hypo-osmolality and hyponatremia: Secondary | ICD-10-CM | POA: Diagnosis not present

## 2019-03-20 DIAGNOSIS — C50412 Malignant neoplasm of upper-outer quadrant of left female breast: Secondary | ICD-10-CM | POA: Diagnosis not present

## 2019-03-20 DIAGNOSIS — Z171 Estrogen receptor negative status [ER-]: Secondary | ICD-10-CM | POA: Diagnosis not present

## 2019-03-20 DIAGNOSIS — Z9221 Personal history of antineoplastic chemotherapy: Secondary | ICD-10-CM | POA: Insufficient documentation

## 2019-03-20 DIAGNOSIS — Z9012 Acquired absence of left breast and nipple: Secondary | ICD-10-CM | POA: Diagnosis not present

## 2019-03-20 MED ORDER — CERTA-VITE PO LIQD: 5.0000 mL | Freq: Every day | ORAL | 6 refills | Status: DC

## 2019-03-20 NOTE — Assessment & Plan Note (Signed)
Left breast biopsy 04/12/2016: IDC grade 2-3, left axillary LN positive for metastatic IDC; mammogram revealed a 5 cm solid mass left breast 12:00 anterior depth. ER 0%, PR 0%, Ki-67 80%, HER-2 positive ratio 7.86, gene copy #16.5  Breast MRI: 05/16/2016:Left breast solid with cystic appearance OUQ 1:00 crossing into the medial left breast 4.7 x 3.3 x 4.2 cm; second 68m nodule left breast UOQ: Posterior intramammary/low axillary lymph node, indeterminate left axillary lymph nodes, skin thickening Bone scan and CT chest abdomen pelvis 05/10/2016: No metastatic disease Clinical stage: T2 N1 M0 stage IIB  Left modified radical mastectomy 10/18/2016: Complete response to treatment 0/9 lymph nodes negative ypT0ypN0  Treatment summary: 1. Neoadjuvant chemotherapy with Taxol Herceptin Perjeta. Taxol to be given weekly 12, Herceptin Perjeta to be given every 3 weeks 6 cycles 05/17/16 to 08/29/16 2. Mastectomy with sentinel lymph node biopsy/selective axillary node dissection 10/18/2016: Complete pathologic response 3. Decided against doing radiation given her advanced age. 4.Maintenance Herceptin Perjeta every 3 weeks completed end of August 2018 ------------------------------------------------------------------------------------------------------------------- Surveillance: 1.Breast exam 10/31/2018 benign 2.[facility] of [facility], breast density category B  Return to clinic in 1 year for follow-up

## 2019-03-24 ENCOUNTER — Telehealth: Payer: Self-pay | Admitting: *Deleted

## 2019-03-24 ENCOUNTER — Other Ambulatory Visit: Payer: Self-pay | Admitting: Hematology

## 2019-03-24 ENCOUNTER — Telehealth: Payer: Self-pay | Admitting: Hematology

## 2019-03-24 DIAGNOSIS — C01 Malignant neoplasm of base of tongue: Secondary | ICD-10-CM | POA: Insufficient documentation

## 2019-03-24 DIAGNOSIS — Z85810 Personal history of malignant neoplasm of tongue: Secondary | ICD-10-CM | POA: Insufficient documentation

## 2019-03-24 NOTE — Telephone Encounter (Signed)
Oncology Nurse Navigator Documentation  Placed introductory call to new referral patient Ms. Jessica Sherman.  Introduced myself as the H&N oncology nurse navigator that works with Drs. Maylon Peppers and Isidore Moos to whom she has been referred by Dr. Lindi Adie, [facility].    She confirmed understanding of referral and 7/17 appt with Dr. Maylon Peppers at [facility].    She noted she has not yet heard from [facility] and [facility] re appt with Dr. Constance Holster.  I indicated I would contact his office.  Briefly explained my role as her navigator, provided my contact information.   I encouraged her to call with questions/concerns as she moves forward with appts.    She verbalized understanding of information provided, expressed appreciation for my call.  I LVVM with dtr [name] with same information.   Navigator Initial Assessment . Retired, very flexible hours for appts . Support System: 2 dtrs . Transportation Needs: family/friends provide tansportation  Gayleen Orem, Therapist, sports, BSN Head & Neck Oncology Nurse [place] at [place] [phone]

## 2019-03-24 NOTE — Progress Notes (Signed)
[place] OFFICE PROGRESS NOTE  Patient Care Team: [name], MD as PCP - General (Internal Medicine) [name], Charlestine Massed, NP as Nurse Practitioner (Hematology and Oncology) [name], MD as Consulting Physician (Hematology and Oncology) Bensimhon, Shaune Pascal, MD as Consulting Physician (Cardiology) [name], MD as Consulting Physician (General Surgery)  HEME/ONC OVERVIEW: 1. 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  She presented for her routine follow-up with Dr. GuSonny Dandyor evaluation of dysphasia, for which CT neck was done and showed a large right base of tongue mass, concerning for malignancy.  There was a questionable 9 mm R Level II LN on CT neck, but PET did not demonstrate any nodal or metastatic disease except an indeterminant 53m53mndeterminate RML nodule.  -I discussed the imaging results in detail with the patient, as well  as NCCN guideline -In addition, the case was discussed extensively at H&N tumor board -Patient has been referred to Dr. RosConstance Holster ENT but does not yet have an appt; if biopsy of the right base of the tongue mass confirms squamous cell carcinoma, this is consistent with a second primary malignancy, unrelated to her history of breast cancer -We discussed some of the current treatment approach for newly diagnosed squamous cell carcinoma of the base of the tongue, including upfront resection followed by adjuvant treatment based on final pathology and definitive therapy, such as radiation with or without chemotherapy -In light of the large primary malignancy, surgical resection is likely going to be associated with significant morbidities and may require free flap reconstruction, which may lead to significant impact on her quality of life -If she decides to forego surgical resection, her treatment options include the definitive RT vs chemoRT -We discussed the role of chemotherapy, including some of the risks, benefits, side-effects of carboplatin and its role as chemo sensitizing agent. -Some of the short term side-effects included, though not limited to, including weight loss, life threatening infections, risk of allergic reactions, need for transfusions of blood products, nausea, vomiting, change in bowel habits, loss of hair, admission to hospital for various reasons, and risks of death.  -Long term side-effects are also discussed including risks of infertility, permanent damage to nerve function, hearing loss, chronic fatigue, kidney damage with possibility needing hemodialysis, and rare secondary malignancy including bone marrow disorders. -Given the patient's baseline moderate to severe hearing impairment, her age, and cachexia, I expressed my concern that she would tolerate chemotherapy poorly, and that the potential harms of chemotherapy  would likely outweigh benefits  -Furthermore, based on the current  imaging studies, the malignancy appears to still be cT2N0M0; in the absence of definite nodal involvement and given her advanced age, the treatment approach that would likely provide the most benefits and least treatment-related toxicities would be definitve RT -After lengthy discussions, patient expressed understanding and was in agreement with proceeding with RT, pending the ENT evaluation and biopsy results   Severe hyponatremia -Na 120 today, severely low; new -In light of the patient's decrease in PO intake due to dysphagia, I suspect that this is most likely related to dehydration -Given the severity and new onset hyponatremia as well as her age, patient is at high risk for neurologic complications, including seizure -Therefore, I have sent the patient to ER for further evaluation and discussed the case with the ER physician -I also discussed the case with Dr. Isidore Moos of radiation oncology; given the patient's poor PO intake and severe hyponatremia even prior to starting treatment, she will likely need a feeding tube to support hydration and nutrition  Normocytic anemia -Likely due to anemia of chronic disease -Hgb 11.8 today -Patient denies any symptoms of bleeding -We will monitor it for now  Leukocytosis -WBC 14.4k today, new; etiology unclear -Patient denies any symptoms of infection -She is being directed to ER for further evaluation, and we will defer any further infectious work-up to the ER    No orders of the defined types were placed in this encounter.  All questions were answered. The patient knows to call the clinic with any problems, questions or concerns. No barriers to learning was detected.  A total of more than 60 minutes were spent face-to-face with the patient during this encounter and over half of that time was spent on counseling and coordination of care as outlined above.   Return to be determined, pending the work-up above.   Tish Men, MD 03/28/2019 1:33  PM  CHIEF COMPLAINT: "I am really anxious"  INTERVAL HISTORY: Jessica Sherman presents to the oncology clinic for evaluation of suspected right base of the tongue malignancy.  Patient reports that she has had several months dysphasia, mostly with solid food, but she has been able to drink liquids without significant difficulty, such as chicken soup, yogurt, and Ensure.  Her appetite has decreased significantly, she has lost at least 10 pounds over the past 2 months.  She also reports chronic, moderate to severe baseline hearing difficulty affecting primarily the right ear, and this was confirmed by her daughter, who accompanied her during the visit.  According to the patient's daughter, patient has also been having more slowed mentation recently.  She denies any significant pain with swallowing, chest pain, dyspnea, abdominal pain, nausea, vomiting, diarrhea, or abnormal bleeding/bruising.  SUMMARY OF ONCOLOGIC HISTORY: Oncology History  Breast cancer of upper-outer quadrant of left female breast ([place])  04/12/2016 Initial Diagnosis   Left breast biopsy: IDC grade 2-3, left axillary LN positive for metastatic IDC; mammogram revealed a 5 cm solid mass left breast 12:00 anterior depth, ER 0%, PR 0%, Ki-67 80%, HER-2 positive ratio 7.6, copy #16.5   05/10/2016 Imaging   Bone scan normal; CT CAP: left breast cancer, left axillary and subpectoral nodes are not pathologic by size criteriabut suspicious, no metastatic disease   05/16/2016 Breast MRI   Left breast solid with cystic appearance OUQ 1:00 crossing into the medial left breast 4.7 x 3.3 x 4.2 cm; second 23m nodule left breast UOQ: Posterior intramammary/low axillary lymph node, indeterminate  left axillary lymph nodes, skin thickening   05/17/2016 - 08/29/2016 Neo-Adjuvant Chemotherapy   Taxol weekly 12 Herceptin and Perjeta every 3 weeks 6   09/05/2016 Breast MRI   Breast MRI: Complete Imaging response   10/18/2016 Surgery   Left modified radical  mastectomy: Complete response to treatment 0/9 lymph nodes negative [id number]    03/12/2019 Relapse/Recurrence   Patient had dysphagia and pain in neck. CT neck showed a large mass at the base of the tongue compatible with carcinoma and a 9 mm right level 2 lymph node which shows increased enhancement and is suspicious for metastatic disease. PET scan showed the large right tongue base mass consistent with neoplasm, a 54m pulmonary nodule that was indeterminate. No evidence of metastases in the chest, abdomen or pelvis.   [place] of [place] ([facility]  03/13/2019 Imaging   CT neck w/ contrast: IMPRESSION: Large mass lesion base of tongue on the right compatible with carcinoma.   9 mm right level 2 lymph node which shows increased enhancement and is suspicious for metastatic disease. Correlate with PET-CT.   03/19/2019 Imaging   PET: IMPRESSION: 1. Large right tongue base mass is markedly hypermetabolic and consistent with neoplasm. 2. No neck adenopathy or evidence of metastatic disease in the chest, abdomen or pelvis. 3. Single 4 mm nodule in the right middle lobe is indeterminate. Recommend follow-up noncontrast chest CT in 6-12 months. 4. Surgical changes from a left mastectomy. No chest wall mass or supraclavicular or axillary adenopathy.   03/24/2019 Initial Diagnosis   Cancer of base of tongue ([facility])     REVIEW OF SYSTEMS:   Constitutional: ( - ) fevers, ( - )  chills , ( - ) night sweats Eyes: ( - ) blurriness of vision, ( - ) double vision, ( - ) watery eyes Ears, nose, mouth, throat, and face: ( - ) mucositis, ( - ) sore throat Respiratory: ( - ) cough, ( - ) dyspnea, ( - ) wheezes Cardiovascular: ( - ) palpitation, ( - ) chest discomfort, ( - ) lower extremity swelling Gastrointestinal:  ( - ) nausea, ( - ) heartburn, ( - ) change in bowel habits Skin: ( - ) abnormal skin rashes Lymphatics: ( - ) new lymphadenopathy, ( - ) easy bruising Neurological: ( - ) numbness, ( - )  tingling, ( - ) new weaknesses Behavioral/Psych: ( + ) anxiety, ( - ) new changes  All other systems were reviewed with the patient and are negative.  I have reviewed the past medical history, past surgical history, social history and family history with the patient and they are unchanged from previous note.  ALLERGIES:  is allergic to other.  MEDICATIONS:  No current facility-administered medications for this visit.    Current Outpatient Medications  Medication Sig Dispense Refill  . amLODipine (NORVASC) 5 MG tablet Take 1 tablet (5 mg total) by mouth daily. 90 tablet 3  . Calcium Carb-Cholecalciferol (CALCIUM 600+D3 PO) Take 1 tablet by mouth 2 (two) times daily.    . Cholecalciferol (VITAMIN D3) 1000 UNITS CAPS Take 1,000 Units by mouth daily.     . diazepam (VALIUM) 5 MG tablet Take one tab one hour prior to procedure, may repeat x 1 if needed 2 tablet 0  . levothyroxine (SYNTHROID) 88 MCG tablet TAKE 1 TABLET BY MOUTH EVERY DAY EXCEPT ON WEDNESDAY TAKE 1/2 TABLET 90 tablet 1  . multivitamin with minerals (CERTA-VITE) LIQD Take 5 mLs by mouth daily. 200 mL 6  .  simvastatin (ZOCOR) 20 MG tablet Take 1 tablet (20 mg total) by mouth at bedtime. 90 tablet 1  . sodium fluoride (PREVIDENT 5000 PLUS) 1.1 % CREA dental cream Place gel into fluoride tray at bedtime.  Place over teeth for 5 minutes.  Remove.  Spit out excess.  Repeat nightly. 1 Tube prn   Facility-Administered Medications Ordered in Other Visits  Medication Dose Route Frequency Provider Last Rate Last Dose  . 0.9 %  sodium chloride infusion   Intravenous Once Curatolo, Adam, DO        PHYSICAL EXAMINATION: ECOG PERFORMANCE STATUS: 2 - Symptomatic, <50% confined to bed  Today's Vitals   03/28/19 1238  BP: (!) 143/60  Pulse: 80  Resp: 18  Temp: 98 F (36.7 C)  TempSrc: Temporal  SpO2: 100%  Weight: 106 lb 12.8 oz (48.4 kg)  Height: '5\' 3"'  (1.6 m)  PainSc: 0-No pain   Body mass index is 18.92 kg/m.  Filed Weights    03/28/19 1238  Weight: 106 lb 12.8 oz (48.4 kg)    GENERAL: alert, no distress, thin SKIN: skin color, texture, turgor are normal, no rashes or significant lesions EYES: conjunctiva are pink and non-injected, sclera clear OROPHARYNX: no exudate, no erythema; lips, buccal mucosa normal; unable to visualize the posterior tongue  NECK: supple, non-tender LYMPH:  no palpable lymphadenopathy in the cervical LUNGS: clear to auscultation with normal breathing effort HEART: regular rate & rhythm and no murmurs and no lower extremity edema ABDOMEN: soft, non-tender, non-distended, normal bowel sounds Musculoskeletal: no cyanosis of digits and no clubbing  PSYCH: alert, slowed speech and mentation  LABORATORY DATA:  I have reviewed the data as listed    Component Value Date/Time   NA 120 (L) 03/28/2019 1148   NA 140 05/01/2017 1011   K 3.6 03/28/2019 1148   K 4.1 05/01/2017 1011   CL 84 (L) 03/28/2019 1148   CO2 30 03/28/2019 1148   CO2 30 (H) 05/01/2017 1011   GLUCOSE 140 (H) 03/28/2019 1148   GLUCOSE 116 05/01/2017 1011   BUN 18 03/28/2019 1148   BUN 22.1 05/01/2017 1011   CREATININE 0.75 03/28/2019 1148   CREATININE 0.9 05/01/2017 1011   CALCIUM 9.4 03/28/2019 1148   CALCIUM 10.0 05/01/2017 1011   PROT 7.3 03/28/2019 1148   PROT 7.0 05/01/2017 1011   ALBUMIN 4.1 03/28/2019 1148   ALBUMIN 3.6 05/01/2017 1011   AST 15 03/28/2019 1148   AST 20 05/01/2017 1011   ALT 13 03/28/2019 1148   ALT 16 05/01/2017 1011   ALKPHOS 69 03/28/2019 1148   ALKPHOS 72 05/01/2017 1011   BILITOT 0.4 03/28/2019 1148   BILITOT 0.37 05/01/2017 1011   GFRNONAA >60 03/28/2019 1148   GFRAA >60 03/28/2019 1148    No results found for: SPEP, UPEP  Lab Results  Component Value Date   WBC 14.4 (H) 03/28/2019   NEUTROABS 10.7 (H) 03/28/2019   HGB 11.8 (L) 03/28/2019   HCT 36.1 03/28/2019   MCV 95.5 03/28/2019   PLT 250 03/28/2019      Chemistry      Component Value Date/Time   NA 120 (L)  03/28/2019 1148   NA 140 05/01/2017 1011   K 3.6 03/28/2019 1148   K 4.1 05/01/2017 1011   CL 84 (L) 03/28/2019 1148   CO2 30 03/28/2019 1148   CO2 30 (H) 05/01/2017 1011   BUN 18 03/28/2019 1148   BUN 22.1 05/01/2017 1011   CREATININE 0.75 03/28/2019 1148  CREATININE 0.9 05/01/2017 1011      Component Value Date/Time   CALCIUM 9.4 03/28/2019 1148   CALCIUM 10.0 05/01/2017 1011   ALKPHOS 69 03/28/2019 1148   ALKPHOS 72 05/01/2017 1011   AST 15 03/28/2019 1148   AST 20 05/01/2017 1011   ALT 13 03/28/2019 1148   ALT 16 05/01/2017 1011   BILITOT 0.4 03/28/2019 1148   BILITOT 0.37 05/01/2017 1011       RADIOGRAPHIC STUDIES: I have personally reviewed the radiological images as listed below and agreed with the findings in the report. Ct Soft Tissue Neck W Contrast  Result Date: 03/13/2019 CLINICAL DATA:  Dysphagia. Hoarseness. Weight loss. Hemoptysis. History of breast cancer EXAM: CT NECK WITH CONTRAST TECHNIQUE: Multidetector CT imaging of the neck was performed using the standard protocol following the bolus administration of intravenous contrast. CONTRAST:  8m OMNIPAQUE IOHEXOL 300 MG/ML  SOLN COMPARISON:  None. FINDINGS: Pharynx and larynx: Large enhancing mass lesion in the base of tongue on the right. This shows homogeneous enhancement and measures approximately 3.1 x 3.1 x 3.8 cm. Airway intact.  Epiglottis and larynx normal. Salivary glands: No inflammation, mass, or stone. Thyroid: Small thyroid without focal abnormality. Lymph nodes: 9 mm enhancing right level 2 lymph node, suspicious for metastatic disease. Vascular: Normal vascular enhancement. Limited intracranial: Negative Visualized orbits: Negative Mastoids and visualized paranasal sinuses: Negative Skeleton: Cervical spondylosis.  No acute skeletal abnormality. Upper chest: Apically scarring bilaterally. No acute infiltrate or mass in the lung apices. Other: None IMPRESSION: Large mass lesion base of tongue on the right  compatible with carcinoma. 9 mm right level 2 lymph node which shows increased enhancement and is suspicious for metastatic disease. Correlate with PET-CT. Electronically Signed   By: CFranchot GalloM.D.   On: 03/13/2019 14:59   Nm Pet Image Initial (pi) Skull Base To Thigh  Result Date: 03/19/2019 CLINICAL DATA:  Initial treatment strategy for tongue base neoplasm. EXAM: NUCLEAR MEDICINE PET SKULL BASE TO THIGH TECHNIQUE: 5.68 mCi F-18 FDG was injected intravenously. Full-ring PET imaging was performed from the skull base to thigh after the radiotracer. CT data was obtained and used for attenuation correction and anatomic localization. Fasting blood glucose: 120 mg/dl COMPARISON:  Neck CT 03/13/2019 FINDINGS: Mediastinal blood pool activity: SUV max 2.24 Liver activity: SUV max NA NECK: Large right-sided tongue base mass extending into the right tonsillar and vallecular regions. This is markedly hypermetabolic with SUV max of 267.89 No enlarged or hypermetabolic neck or supraclavicular lymph nodes. There is fairly extensive hypermetabolic brown fat noted mainly in the supraclavicular areas. Incidental CT findings: none CHEST: Emphysematous changes and pulmonary scarring but no acute pulmonary findings. 4 mm sub solid nodule in the right middle lobe is indeterminate. It was not present on the prior chest CT from 2017. It could be inflammatory. Follow-up noncontrast chest CT in 6-12 months is suggested. Small nodule along the left major fissure was present on 2017 and is likely a lymph node. No supraclavicular or axillary hypermetabolic adenopathy. No mediastinal or hilar adenopathy. Incidental CT findings: Surgical changes from a left mastectomy. ABDOMEN/PELVIS: No abnormal hypermetabolic activity within the liver, pancreas, adrenal glands, or spleen. No hypermetabolic lymph nodes in the abdomen or pelvis. Incidental CT findings: none SKELETON: No findings for metastatic bone disease. Areas of hypermetabolism in  the intercostal muscles in the chest are noted. This is a benign finding. Incidental CT findings: none IMPRESSION: 1. Large right tongue base mass is markedly hypermetabolic and consistent with neoplasm.  2. No neck adenopathy or evidence of metastatic disease in the chest, abdomen or pelvis. 3. Single 4 mm nodule in the right middle lobe is indeterminate. Recommend follow-up noncontrast chest CT in 6-12 months. 4. Surgical changes from a left mastectomy. No chest wall mass or supraclavicular or axillary adenopathy. Electronically Signed   By: Marijo Sanes M.D.   On: 03/19/2019 12:37

## 2019-03-24 NOTE — Telephone Encounter (Signed)
Called and spoke with patient regarding appointment date/time/location/phone number. She asked that I also call her daughter Antonieta Iba and give her this info only.  I did LMVM for [name] with the same info as I gave the patient.

## 2019-03-25 ENCOUNTER — Telehealth: Payer: Self-pay | Admitting: Radiation Oncology

## 2019-03-25 ENCOUNTER — Telehealth: Payer: Self-pay | Admitting: *Deleted

## 2019-03-25 NOTE — Telephone Encounter (Signed)
Oncology Nurse Navigator Documentation  Spoke with [name], re pt's pending appt with Dr. Constance Holster.  She indicated transfer of care from Dr. Redmond Baseman to Dr. Constance Holster in progress, will take about a week before appt can be scheduled.  Spoke with dtr [name].  Introduced myself, explained my role as their navigator, provided my contact information.  Informed her of above re GENT.  Encouraged her to call to confirm transfer of care underway.  She stated she is HCPOA for her mother, she is primary contact, she provides transportation to appts.  She confirmed understanding of Friday's appt with Dr. Maylon Peppers at [facility] as this is next available, I explained future appts and chemo if part of tmt plan will be at [facility].  I explained appt with Dr. Isidore Moos is pending, will likely be via WebEx.  I encouraged her to call me with questions/concerns as her mother moves forward with appts. She voiced understanding of information provided.  Gayleen Orem, RN, BSN Head & Neck Oncology Nurse [facility] [place] [phone]

## 2019-03-25 NOTE — Telephone Encounter (Signed)
New message:    LVM for patient to call back to schedule appt for referral received

## 2019-03-26 NOTE — Progress Notes (Addendum)
Head and Neck Cancer Location of Tumor / Histology:  Not completed yet.   Patient presented with symptoms of: She reported difficulty swallowing with hoarseness, weight loss, and feeling like something was in her throat for a while to her PCP Billey Gosling on 03/13/19 and she ordered a CT scan.   Biopsies of revealed: Will be completed today with Dr. Constance Holster per patient    Nutrition Status Yes No Comments  Weight changes? [x]  []  She has lost about 10 lbs since symptoms first started  Swallowing concerns? [x]  [x]  She can drink liquids well. She is eating softer foods  PEG? []  [x]     Referrals Yes No Comments  Social Work? []  [x]    Dentistry? [x]  []  Dr. Enrique Sack 03/27/19  Swallowing therapy? []  [x]    Nutrition? []  [x]    Med/Onc? [x]  []  Dr. Maylon Peppers 03/28/19.    Safety Issues Yes No Comments  Prior radiation? []  [x]    Pacemaker/ICD? []  [x]    Possible current pregnancy? []  [x]    Is the patient on methotrexate? []  [x]     Tobacco/Marijuana/Snuff/ETOH use: No  Past/Anticipated interventions by otolaryngology, if any:  She is transferring care from Dr. Redmond Baseman (who she had seen in the past for impacted cerumen)  to Dr. Constance Holster and will have an appointment scheduled soon.  They report that she is to have a biopsy today  Past/Anticipated interventions by medical oncology, if any:  Dr. Maylon Peppers 03/28/19    Current Complaints / other details:   -History of Left breast cancer. She received chemotherapy ordered by Dr. Lindi Adie. She completed therapy in August 2018. She declined radiation therapy.   -Her daughter [name] is her HCPOA and primary contact.   -PET completed 03/19/19

## 2019-03-27 ENCOUNTER — Ambulatory Visit (HOSPITAL_COMMUNITY): Payer: Self-pay | Admitting: Dentistry

## 2019-03-27 ENCOUNTER — Other Ambulatory Visit: Payer: Self-pay

## 2019-03-27 ENCOUNTER — Encounter (HOSPITAL_COMMUNITY): Payer: Self-pay | Admitting: Dentistry

## 2019-03-27 VITALS — BP 154/60 | HR 74 | Temp 98.6°F

## 2019-03-27 DIAGNOSIS — M270 Developmental disorders of jaws: Secondary | ICD-10-CM

## 2019-03-27 DIAGNOSIS — C01 Malignant neoplasm of base of tongue: Secondary | ICD-10-CM

## 2019-03-27 DIAGNOSIS — K036 Deposits [accretions] on teeth: Secondary | ICD-10-CM

## 2019-03-27 DIAGNOSIS — K031 Abrasion of teeth: Secondary | ICD-10-CM

## 2019-03-27 DIAGNOSIS — K0530 Chronic periodontitis, unspecified: Secondary | ICD-10-CM

## 2019-03-27 DIAGNOSIS — Z01818 Encounter for other preprocedural examination: Secondary | ICD-10-CM

## 2019-03-27 DIAGNOSIS — K06010 Localized gingival recession, unspecified: Secondary | ICD-10-CM

## 2019-03-27 DIAGNOSIS — K117 Disturbances of salivary secretion: Secondary | ICD-10-CM

## 2019-03-27 DIAGNOSIS — K08409 Partial loss of teeth, unspecified cause, unspecified class: Secondary | ICD-10-CM

## 2019-03-27 DIAGNOSIS — Z972 Presence of dental prosthetic device (complete) (partial): Secondary | ICD-10-CM

## 2019-03-27 MED ORDER — SODIUM FLUORIDE 1.1 % DT CREA: TOPICAL_CREAM | DENTAL | 99 refills | Status: DC

## 2019-03-27 NOTE — Progress Notes (Signed)
DENTAL CONSULTATION  Date of Consultation:  03/27/2019 Patient Name:   Jessica Sherman Date of Birth:   02-11-1931 Medical Record Number: [id number]  COVID 19 SCREENING: The patient does not symptoms concerning for COVID-19 infection (Including fever, chills, cough, or new SHORTNESS OF BREATH).    VITALS: BP (!) 154/60 (BP Location: Right Arm)   Pulse 74   Temp 98.6 F (37 C)   CHIEF COMPLAINT: Patient was referred by Dr. Isidore Moos for dental consultation.  HPI: Jessica Sherman is an 85 year old female recently diagnosed with cancer of the base of tongue.  Patient with anticipated radiation therapy and possible chemotherapy.  Patient is now seen as part of a medically necessary pre-chemoradiation therapy dental protocol examination.  The patient currently denies acute toothaches, swellings, or abscesses.  Patient was seen by her primary dentist, Dr. Derrek Gu, yesterday for an exam and cleaning.  Patient is usually seen on an every 54-month basis.  Patient has upper and lower cast partial dentures that "fit fine".  These were fabricated more than 10 years ago.  Patient denies having dental phobia.   PROBLEM LIST: Patient Active Problem List   Diagnosis Date Noted  . Cancer of base of tongue ([place]) 03/24/2019    Priority: High  . Oropharyngeal dysphagia 03/14/2019  . Dysphasia 01/27/2019  . Hypertension 12/13/2016  . S/P mastectomy, left 10/18/2016  . Breast cancer ([place]) 10/18/2016  . Port catheter in place 05/23/2016  . Breast cancer of upper-outer quadrant of left female breast ([place]) 04/30/2016  . Conductive hearing loss in right ear 12/09/2015  . Enteritis 05/30/2013  . Prediabetes   . Macular degeneration, dry   . Vertigo   . Vitamin D deficiency 11/17/2008  . COLONIC POLYPS, HX OF 11/17/2008  . Hypothyroidism 11/19/2007  . HYPERLIPIDEMIA 11/19/2007  . Osteopenia 02/18/2007    PMH: Past Medical History:  Diagnosis Date  . Hx of colonic polyps   .  Hyperlipidemia   . Hypothyroidism   . Macular degeneration, dry   . Osteopenia    spine BMD T score - 1.69  . PONV (postoperative nausea and vomiting)    pt has vertigo and can not lie flat  . Prediabetes    FBS 136  on 08/29/2010; A1c 6.2%  . Vertigo     PSH: Past Surgical History:  Procedure Laterality Date  . CATARACT EXTRACTION  2009   OS  . COLONOSCOPY W/ POLYPECTOMY     X 2; last 2012. Dr Watt Climes  . IR GENERIC HISTORICAL  05/09/2016   IR US GUIDE VASC ACCESS RIGHT 05/09/2016 Jacqulynn Cadet, MD [facility]  . IR GENERIC HISTORICAL  05/09/2016   IR FLUORO GUIDE CV LINE RIGHT 05/09/2016 Jacqulynn Cadet, MD [facility]  . MODIFIED MASTECTOMY Left 10/18/2016   Procedure: LEFT MODIFIED RADICAL MASTECTOMY;  Surgeon: [name], MD;  Location: [facility];  Service: General;  Laterality: Left;  . PILONIDAL CYST EXCISION     X 3  . TONSILLECTOMY AND ADENOIDECTOMY    . TOTAL ABDOMINAL HYSTERECTOMY     w/ bladder repair    ALLERGIES: Allergies  Allergen Reactions  . Other Nausea And Vomiting    Anesthesia "makes me very sick", Please pre-medicate to control nausea and vomiting    MEDICATIONS: Current Outpatient Medications  Medication Sig Dispense Refill  . amLODipine (NORVASC) 5 MG tablet Take 1 tablet (5 mg total) by mouth daily. 90 tablet 3  . Calcium Carb-Cholecalciferol (CALCIUM 600+D3 PO) Take 1 tablet by  mouth 2 (two) times daily.    . Cholecalciferol (VITAMIN D3) 1000 UNITS CAPS Take 1,000 Units by mouth daily.     . diazepam (VALIUM) 5 MG tablet Take one tab one hour prior to procedure, may repeat x 1 if needed 2 tablet 0  . levothyroxine (SYNTHROID) 88 MCG tablet TAKE 1 TABLET BY MOUTH EVERY DAY EXCEPT ON WEDNESDAY TAKE 1/2 TABLET 90 tablet 1  . simvastatin (ZOCOR) 20 MG tablet Take 1 tablet (20 mg total) by mouth at bedtime. 90 tablet 1  . multivitamin with minerals (CERTA-VITE) LIQD Take 5 mLs by mouth daily. (Patient not taking: Reported on  03/27/2019) 200 mL 6  . sodium fluoride (PREVIDENT 5000 PLUS) 1.1 % CREA dental cream Place gel into fluoride tray at bedtime.  Place over teeth for 5 minutes.  Remove.  Spit out excess.  Repeat nightly. 1 Tube prn   No current facility-administered medications for this visit.     LABS: Lab Results  Component Value Date   WBC 9.3 01/27/2019   HGB 12.0 01/27/2019   HCT 34.3 (L) 01/27/2019   MCV 99.4 01/27/2019   PLT 270.0 01/27/2019      Component Value Date/Time   NA 134 (L) 01/27/2019 1424   NA 140 05/01/2017 1011   K 4.2 01/27/2019 1424   K 4.1 05/01/2017 1011   CL 97 01/27/2019 1424   CO2 29 01/27/2019 1424   CO2 30 (H) 05/01/2017 1011   GLUCOSE 124 (H) 01/27/2019 1424   GLUCOSE 116 05/01/2017 1011   BUN 16 03/13/2019 1224   BUN 22.1 05/01/2017 1011   CREATININE 0.71 03/13/2019 1224   CREATININE 0.9 05/01/2017 1011   CALCIUM 9.4 01/27/2019 1424   CALCIUM 10.0 05/01/2017 1011   GFRNONAA >60 11/01/2017 1047   GFRAA >60 11/01/2017 1047   Lab Results  Component Value Date   INR 0.98 05/09/2016   No results found for: PTT  SOCIAL HISTORY: Social History   Socioeconomic History  . Marital status: Widowed    Spouse name: Not on file  . Number of children: 4  . Years of education: Not on file  . Highest education level: Not on file  Occupational History  . Occupation: retired  Scientific laboratory technician  . Financial resource strain: Not on file  . Food insecurity    Worry: Not on file    Inability: Not on file  . Transportation needs    Medical: Not on file    Non-medical: Not on file  Tobacco Use  . Smoking status: Never Smoker  . Smokeless tobacco: Never Used  Substance and Sexual Activity  . Alcohol use: No    Comment:    . Drug use: No  . Sexual activity: Not on file  Lifestyle  . Physical activity    Days per week: Not on file    Minutes per session: Not on file  . Stress: Not on file  Relationships  . Social Herbalist on phone: Not on file     Gets together: Not on file    Attends religious service: Not on file    Active member of club or organization: Not on file    Attends meetings of clubs or organizations: Not on file    Relationship status: Not on file  . Intimate partner violence    Fear of current or ex partner: Not on file    Emotionally abused: Not on file    Physically abused: Not on file  Forced sexual activity: Not on file  Other Topics Concern  . Not on file  Social History Narrative   Regular exercise- yes 4mpd w/o symptoms    FAMILY HISTORY: Family History  Problem Relation Age of Onset  . Osteoporosis Mother   . Leukemia Mother   . Cancer Maternal Uncle        ? stomach  . Aneurysm Father        AAA  . Heart attack Neg Hx     REVIEW OF SYSTEMS: Reviewed with the patient as per History of present illness. Psych: Patient denies having dental phobia.  DENTAL HISTORY: CHIEF COMPLAINT: Patient was referred by Dr. Isidore Moos for dental consultation.  HPI: Jessica Sherman is an 85 year old female recently diagnosed with cancer of the base of tongue.  Patient with anticipated radiation therapy and possible chemotherapy.  Patient is now seen as part of a medically necessary pre-chemoradiation therapy dental protocol examination.  The patient currently denies acute toothaches, swellings, or abscesses.  Patient was seen by her primary dentist, Dr. Derrek Gu, yesterday for an exam and cleaning.  Patient is usually seen on an every 65-month basis.  Patient has upper and lower cast partial dentures that "fit fine".  These were fabricated more than 10 years ago.  Patient denies having dental phobia.   DENTAL EXAMINATION: GENERAL: The patient is a well-nourished, well developed female no acute distress. HEAD AND NECK: There is no palpable neck lymphadenopathy.  The patient denies acute TMJ symptoms. INTRAORAL EXAM: The patient has incipient xerostomia.  The patient has bilateral mandibular lingual tori.   There is no evidence of intraoral abscess formation.  Patient has a maximum interincisal opening of 40 mm. DENTITION: Patient is missing tooth numbers 1, 2, 4, 5, 10, 12 through 16, 17, 18, 21, 23, 24, 29, 30, 31, and 32.  Multiple missing teeth are replaced with either crown or bridge restorations or upper and lower cast partial dentures. PERIODONTAL: Patient has chronic periodontitis with minimal plaque accumulations, gingival recession, and no significant tooth mobility. DENTAL CARIES/SUBOPTIMAL RESTORATIONS: No obvious dental caries or suboptimal dental restorations are noted.  Multiple flexure lesions are noted. ENDODONTIC: The patient currently denies acute pulpitis symptoms.  I do not see any evidence of periapical pathology or radiolucency. CROWN AND BRIDGE: Patient has multiple crown and bridge restorations that are clinically acceptable. PROSTHODONTIC: Patient has upper and lower cast partial dentures that are stable and retentive. OCCLUSION: Patient has a poor occlusal scheme but a stable occlusion at this time.  Patient does have a history of previous orthodontic therapy by patient report.  RADIOGRAPHIC INTERPRETATION: An orthopantogram was taken and supplemented with 11 upper and lower periapical radiographs. There are multiple missing teeth.  There are multiple crown and bridge restorations.  There is incipient to moderate bone loss noted.  There are mandibular radiopacities consistent with bilateral mandibular lingual tori.  Multiple resin restorations are noted.  There are is evidence of radiopaque material in the area of tooth numbers 23 and 24 consistent with bone graft material.  ASSESSMENTS: 1.  Cancer of the base of tongue 2.  Pre-chemoradiation therapy dental protocol 3.  Chronic periodontitis with bone loss 4.  Gingival recession 5.  Accretions-minimal 6.  Multiple missing teeth 7.  Bilateral mandibular lingual tori 8.  Incipient xerostomia 9.  Multiple flexure  lesions 10.  Upper and lower cast partial dentures 11.  Multiple crown and bridge restorations 12.  Poor occlusal scheme but a stable occlusion  PLAN/RECOMMENDATIONS: 1. I discussed the risks, benefits, and complications of various treatment options with the patient in relationship to her medical and dental conditions, anticipated chemoradiation therapy, chemoradiation therapy side effects to include xerostomia, radiation caries, trismus, mucositis, taste changes, gum and jawbone changes, and risk for infection and osteoradionecrosis. We discussed various treatment options to include no treatment, extraction of teeth and a primary field radiation therapy, alveoloplasty, pre-prosthetic surgery as indicated, periodontal therapy, dental restorations, root canal therapy, crown and bridge therapy, implant therapy, and replacement of missing teeth as indicated. The patient currently is adamant about not wanting dental extractions at this time.  I will discuss anticipated ports and doses with Dr. Isidore Moos as indicated but feel that Dr. Isidore Moos should be able to keep all of the remaining teeth out of the primary field of radiation therapy.  Patient did agree to proceed with impressions today for the fabrication of fluoride trays and scatter protection devices.  The patient tolerated this procedure well.  A prescription for Prevident 5000 Plus was sent to [facility] with refills for 1 year.  Patient will be scheduled for insertion of fluoride trays scattered protection devices prior to anticipated simulation appointment with Dr. Isidore Moos in the future.     2. Discussion of findings with medical team and coordination of future medical and dental care as needed.  I spent in excess of  120 minutes during the conduct of this consultation and >50% of this time involved direct face-to-face encounter for counseling and/or coordination of the patient's care.    Lenn Cal, DDS

## 2019-03-27 NOTE — Patient Instructions (Signed)

## 2019-03-28 ENCOUNTER — Observation Stay (HOSPITAL_BASED_OUTPATIENT_CLINIC_OR_DEPARTMENT_OTHER)
Admission: EM | Admit: 2019-03-28 | Discharge: 2019-03-29 | Disposition: A | Discharge status: Home / Self Care | Payer: PPO | Attending: Internal Medicine | Admitting: Internal Medicine

## 2019-03-28 ENCOUNTER — Other Ambulatory Visit: Payer: Self-pay

## 2019-03-28 ENCOUNTER — Inpatient Hospital Stay: Payer: PPO

## 2019-03-28 ENCOUNTER — Encounter (HOSPITAL_BASED_OUTPATIENT_CLINIC_OR_DEPARTMENT_OTHER): Payer: Self-pay | Admitting: Emergency Medicine

## 2019-03-28 ENCOUNTER — Telehealth: Payer: Self-pay | Admitting: Hematology

## 2019-03-28 ENCOUNTER — Inpatient Hospital Stay (HOSPITAL_BASED_OUTPATIENT_CLINIC_OR_DEPARTMENT_OTHER): Payer: PPO | Admitting: Hematology

## 2019-03-28 ENCOUNTER — Emergency Department (HOSPITAL_BASED_OUTPATIENT_CLINIC_OR_DEPARTMENT_OTHER): Payer: PPO

## 2019-03-28 ENCOUNTER — Encounter: Payer: Self-pay | Admitting: Hematology

## 2019-03-28 VITALS — BP 143/60 | HR 80 | Temp 98.0°F | Resp 18 | Ht 63.0 in | Wt 106.8 lb

## 2019-03-28 DIAGNOSIS — Z9012 Acquired absence of left breast and nipple: Secondary | ICD-10-CM | POA: Diagnosis not present

## 2019-03-28 DIAGNOSIS — Z79899 Other long term (current) drug therapy: Secondary | ICD-10-CM | POA: Insufficient documentation

## 2019-03-28 DIAGNOSIS — Z03818 Encounter for observation for suspected exposure to other biological agents ruled out: Secondary | ICD-10-CM | POA: Diagnosis not present

## 2019-03-28 DIAGNOSIS — R7303 Prediabetes: Secondary | ICD-10-CM | POA: Insufficient documentation

## 2019-03-28 DIAGNOSIS — M8580 Other specified disorders of bone density and structure, unspecified site: Secondary | ICD-10-CM | POA: Diagnosis not present

## 2019-03-28 DIAGNOSIS — E785 Hyperlipidemia, unspecified: Secondary | ICD-10-CM | POA: Insufficient documentation

## 2019-03-28 DIAGNOSIS — D649 Anemia, unspecified: Secondary | ICD-10-CM

## 2019-03-28 DIAGNOSIS — E782 Mixed hyperlipidemia: Secondary | ICD-10-CM | POA: Diagnosis not present

## 2019-03-28 DIAGNOSIS — H9011 Conductive hearing loss, unilateral, right ear, with unrestricted hearing on the contralateral side: Secondary | ICD-10-CM | POA: Diagnosis not present

## 2019-03-28 DIAGNOSIS — R1312 Dysphagia, oropharyngeal phase: Secondary | ICD-10-CM | POA: Diagnosis not present

## 2019-03-28 DIAGNOSIS — C01 Malignant neoplasm of base of tongue: Secondary | ICD-10-CM | POA: Diagnosis present

## 2019-03-28 DIAGNOSIS — D72829 Elevated white blood cell count, unspecified: Secondary | ICD-10-CM | POA: Diagnosis not present

## 2019-03-28 DIAGNOSIS — E860 Dehydration: Secondary | ICD-10-CM | POA: Diagnosis not present

## 2019-03-28 DIAGNOSIS — E871 Hypo-osmolality and hyponatremia: Secondary | ICD-10-CM | POA: Diagnosis present

## 2019-03-28 DIAGNOSIS — R1310 Dysphagia, unspecified: Secondary | ICD-10-CM | POA: Diagnosis not present

## 2019-03-28 DIAGNOSIS — R531 Weakness: Secondary | ICD-10-CM | POA: Insufficient documentation

## 2019-03-28 DIAGNOSIS — E039 Hypothyroidism, unspecified: Secondary | ICD-10-CM | POA: Diagnosis not present

## 2019-03-28 DIAGNOSIS — Z1159 Encounter for screening for other viral diseases: Secondary | ICD-10-CM | POA: Insufficient documentation

## 2019-03-28 DIAGNOSIS — C50412 Malignant neoplasm of upper-outer quadrant of left female breast: Secondary | ICD-10-CM | POA: Diagnosis not present

## 2019-03-28 DIAGNOSIS — Z79890 Hormone replacement therapy (postmenopausal): Secondary | ICD-10-CM | POA: Insufficient documentation

## 2019-03-28 DIAGNOSIS — Z853 Personal history of malignant neoplasm of breast: Secondary | ICD-10-CM | POA: Diagnosis not present

## 2019-03-28 DIAGNOSIS — Z85810 Personal history of malignant neoplasm of tongue: Secondary | ICD-10-CM | POA: Diagnosis present

## 2019-03-28 DIAGNOSIS — Z171 Estrogen receptor negative status [ER-]: Secondary | ICD-10-CM | POA: Diagnosis not present

## 2019-03-28 DIAGNOSIS — H3530 Unspecified macular degeneration: Secondary | ICD-10-CM | POA: Insufficient documentation

## 2019-03-28 DIAGNOSIS — D638 Anemia in other chronic diseases classified elsewhere: Secondary | ICD-10-CM | POA: Insufficient documentation

## 2019-03-28 DIAGNOSIS — I10 Essential (primary) hypertension: Secondary | ICD-10-CM | POA: Diagnosis present

## 2019-03-28 DIAGNOSIS — Z8521 Personal history of malignant neoplasm of larynx: Secondary | ICD-10-CM | POA: Diagnosis not present

## 2019-03-28 DIAGNOSIS — E7800 Pure hypercholesterolemia, unspecified: Secondary | ICD-10-CM | POA: Diagnosis present

## 2019-03-28 LAB — CMP (CANCER CENTER ONLY)
ALT: 13 U/L (ref 0–44)
AST: 15 U/L (ref 15–41)
Albumin: 4.1 g/dL (ref 3.5–5.0)
Alkaline Phosphatase: 69 U/L (ref 38–126)
Anion gap: 6 (ref 5–15)
BUN: 18 mg/dL (ref 8–23)
CO2: 30 mmol/L (ref 22–32)
Calcium: 9.4 mg/dL (ref 8.9–10.3)
Chloride: 84 mmol/L — ABNORMAL LOW (ref 98–111)
Creatinine: 0.75 mg/dL (ref 0.44–1.00)
GFR, Est AFR Am: >60 mL/min (ref >60)
GFR, Estimated: >60 mL/min (ref >60)
Glucose, Bld: 140 mg/dL — ABNORMAL HIGH (ref 70–99)
Potassium: 3.6 mmol/L (ref 3.5–5.1)
Sodium: 120 mmol/L — ABNORMAL LOW (ref 135–145)
Total Bilirubin: 0.4 mg/dL (ref 0.3–1.2)
Total Protein: 7.3 g/dL (ref 6.5–8.1)

## 2019-03-28 LAB — CBC WITH DIFFERENTIAL (CANCER CENTER ONLY)
Abs Immature Granulocytes: 0.07 10*3/uL (ref 0.00–0.07)
Basophils Absolute: 0.1 10*3/uL (ref 0.0–0.1)
Basophils Relative: 0 %
Eosinophils Absolute: 0.1 10*3/uL (ref 0.0–0.5)
Eosinophils Relative: 0 %
HCT: 36.1 % (ref 36.0–46.0)
Hemoglobin: 11.8 g/dL — ABNORMAL LOW (ref 12.0–15.0)
Immature Granulocytes: 1 %
Lymphocytes Relative: 12 %
Lymphs Abs: 1.8 10*3/uL (ref 0.7–4.0)
MCH: 31.2 pg (ref 26.0–34.0)
MCHC: 32.7 g/dL (ref 30.0–36.0)
MCV: 95.5 fL (ref 80.0–100.0)
Monocytes Absolute: 1.7 10*3/uL — ABNORMAL HIGH (ref 0.1–1.0)
Monocytes Relative: 12 %
Neutro Abs: 10.7 10*3/uL — ABNORMAL HIGH (ref 1.7–7.7)
Neutrophils Relative %: 75 %
Platelet Count: 250 10*3/uL (ref 150–400)
RBC: 3.78 MIL/uL — ABNORMAL LOW (ref 3.87–5.11)
RDW: 11.9 % (ref 11.5–15.5)
WBC Count: 14.4 10*3/uL — ABNORMAL HIGH (ref 4.0–10.5)
nRBC: 0 % (ref 0.0–0.2)

## 2019-03-28 LAB — URINALYSIS, MICROSCOPIC (REFLEX): RBC / HPF: NONE SEEN RBC/hpf (ref 0–5)

## 2019-03-28 LAB — BASIC METABOLIC PANEL
Anion gap: 8 (ref 5–15)
BUN: 15 mg/dL (ref 8–23)
CO2: 26 mmol/L (ref 22–32)
Calcium: 9.1 mg/dL (ref 8.9–10.3)
Chloride: 100 mmol/L (ref 98–111)
Creatinine, Ser: 0.55 mg/dL (ref 0.44–1.00)
GFR calc Af Amer: >60 mL/min (ref >60)
GFR calc non Af Amer: >60 mL/min (ref >60)
Glucose, Bld: 106 mg/dL — ABNORMAL HIGH (ref 70–99)
Potassium: 3.9 mmol/L (ref 3.5–5.1)
Sodium: 134 mmol/L — ABNORMAL LOW (ref 135–145)

## 2019-03-28 LAB — URINALYSIS, ROUTINE W REFLEX MICROSCOPIC
Bilirubin Urine: NEGATIVE
Glucose, UA: NEGATIVE mg/dL
Hgb urine dipstick: NEGATIVE
Ketones, ur: NEGATIVE mg/dL
Nitrite: NEGATIVE
Protein, ur: NEGATIVE mg/dL
Specific Gravity, Urine: 1.01 (ref 1.005–1.030)
pH: 6.5 (ref 5.0–8.0)

## 2019-03-28 LAB — SAVE SMEAR(SSMR), FOR PROVIDER SLIDE REVIEW

## 2019-03-28 LAB — SARS CORONAVIRUS 2 BY RT PCR (HOSPITAL ORDER, PERFORMED IN ~~LOC~~ HOSPITAL LAB)
SARS Coronavirus 2: NEGATIVE
SARS Coronavirus 2: NEGATIVE

## 2019-03-28 LAB — MAGNESIUM: Magnesium: 2.2 mg/dL (ref 1.7–2.4)

## 2019-03-28 MED ORDER — SIMVASTATIN 20 MG PO TABS
20.0000 mg | ORAL_TABLET | Freq: Every day | ORAL | Status: DC
  Administered 2019-03-28: 20 mg via ORAL
  Filled 2019-03-28: qty 1

## 2019-03-28 MED ORDER — ONDANSETRON HCL 4 MG PO TABS: 4.0000 mg | ORAL_TABLET | Freq: Four times a day (QID) | ORAL | Status: DC | PRN

## 2019-03-28 MED ORDER — ONDANSETRON HCL 4 MG/2ML IJ SOLN: 4.0000 mg | Freq: Four times a day (QID) | INTRAMUSCULAR | Status: DC | PRN

## 2019-03-28 MED ORDER — SODIUM CHLORIDE 0.9 % IV SOLN
INTRAVENOUS | Status: DC
  Administered 2019-03-28: 22:00:00 via INTRAVENOUS

## 2019-03-28 MED ORDER — SODIUM CHLORIDE 0.9 % IV SOLN
Freq: Once | INTRAVENOUS | Status: AC
  Administered 2019-03-28: 14:00:00 via INTRAVENOUS

## 2019-03-28 MED ORDER — LEVOTHYROXINE SODIUM 88 MCG PO TABS
88.0000 ug | ORAL_TABLET | ORAL | Status: DC
  Administered 2019-03-29: 88 ug via ORAL
  Filled 2019-03-28: qty 1

## 2019-03-28 MED ORDER — ACETAMINOPHEN 325 MG PO TABS: 650.0000 mg | ORAL_TABLET | Freq: Four times a day (QID) | ORAL | Status: DC | PRN

## 2019-03-28 MED ORDER — AMLODIPINE BESYLATE 5 MG PO TABS
5.0000 mg | ORAL_TABLET | Freq: Every day | ORAL | Status: DC
  Administered 2019-03-28: 5 mg via ORAL
  Filled 2019-03-28: qty 1

## 2019-03-28 MED ORDER — ACETAMINOPHEN 650 MG RE SUPP: 650.0000 mg | Freq: Four times a day (QID) | RECTAL | Status: DC | PRN

## 2019-03-28 MED ORDER — DOCUSATE SODIUM 100 MG PO CAPS
100.0000 mg | ORAL_CAPSULE | Freq: Two times a day (BID) | ORAL | Status: DC
  Filled 2019-03-28 (×2): qty 1

## 2019-03-28 MED ORDER — HYDROCODONE-ACETAMINOPHEN 5-325 MG PO TABS: 1.0000 | ORAL_TABLET | ORAL | Status: DC | PRN

## 2019-03-28 MED ORDER — SENNA 8.6 MG PO TABS
1.0000 | ORAL_TABLET | Freq: Two times a day (BID) | ORAL | Status: DC
  Filled 2019-03-28 (×2): qty 1

## 2019-03-28 MED ORDER — ENOXAPARIN SODIUM 40 MG/0.4ML ~~LOC~~ SOLN
40.0000 mg | Freq: Every day | SUBCUTANEOUS | Status: DC
  Administered 2019-03-28: 23:00:00 40 mg via SUBCUTANEOUS
  Filled 2019-03-28: qty 0.4

## 2019-03-28 MED ORDER — LEVOTHYROXINE SODIUM 88 MCG PO TABS: 44.0000 ug | ORAL_TABLET | ORAL | Status: DC

## 2019-03-28 NOTE — ED Provider Notes (Signed)
[facility] EMERGENCY DEPARTMENT Provider Note   CSN: [id number] Arrival date & time: 03/28/19  1318    History   Chief Complaint Chief Complaint  Patient presents with  . Abnormal Lab    HPI Jessica Sherman is a 85 y.o. female.     Patient sent from oncologist with a sodium of 120.  Patient with newly diagnosed base of the tongue cancer.  Eating and drinking has been more difficult.  Likely dehydration per oncologist.  Patient overall appears well.  No seizures.  Has been a little bit lethargic but denies any infectious symptoms.  States that she has been tolerating eating fairly well.  No respiratory symptoms.  The history is provided by the patient.  Illness Location:  General Severity:  Mild Onset quality:  Gradual Timing:  Constant Progression:  Unchanged Chronicity:  New Associated symptoms: no abdominal pain, no chest pain, no cough, no ear pain, no fever, no rash, no shortness of breath, no sore throat and no vomiting     Past Medical History:  Diagnosis Date  . Hx of colonic polyps   . Hyperlipidemia   . Hypothyroidism   . Macular degeneration, dry   . Osteopenia    spine BMD T score - 1.69  . PONV (postoperative nausea and vomiting)    pt has vertigo and can not lie flat  . Prediabetes    FBS 136  on 08/29/2010; A1c 6.2%  . Vertigo     Patient Active Problem List   Diagnosis Date Noted  . Cancer of base of tongue ([place]) 03/24/2019  . Oropharyngeal dysphagia 03/14/2019  . Dysphasia 01/27/2019  . Hypertension 12/13/2016  . S/P mastectomy, left 10/18/2016  . Breast cancer ([place]) 10/18/2016  . Port catheter in place 05/23/2016  . Breast cancer of upper-outer quadrant of left female breast ([place]) 04/30/2016  . Conductive hearing loss in right ear 12/09/2015  . Enteritis 05/30/2013  . Prediabetes   . Macular degeneration, dry   . Vertigo   . Vitamin D deficiency 11/17/2008  . COLONIC POLYPS, HX OF 11/17/2008  . Hypothyroidism 11/19/2007   . HYPERLIPIDEMIA 11/19/2007  . Osteopenia 02/18/2007    Past Surgical History:  Procedure Laterality Date  . CATARACT EXTRACTION  2009   OS  . COLONOSCOPY W/ POLYPECTOMY     X 2; last 2012. Dr Watt Climes  . IR GENERIC HISTORICAL  05/09/2016   IR US GUIDE VASC ACCESS RIGHT 05/09/2016 Jacqulynn Cadet, MD [facility]  . IR GENERIC HISTORICAL  05/09/2016   IR FLUORO GUIDE CV LINE RIGHT 05/09/2016 Jacqulynn Cadet, MD [facility]  . MODIFIED MASTECTOMY Left 10/18/2016   Procedure: LEFT MODIFIED RADICAL MASTECTOMY;  Surgeon: [name], MD;  Location: [facility];  Service: General;  Laterality: Left;  . PILONIDAL CYST EXCISION     X 3  . TONSILLECTOMY AND ADENOIDECTOMY    . TOTAL ABDOMINAL HYSTERECTOMY     w/ bladder repair     OB History   No obstetric history on file.      Home Medications    Prior to Admission medications   Medication Sig Start Date End Date Taking? Authorizing Provider  amLODipine (NORVASC) 5 MG tablet Take 1 tablet (5 mg total) by mouth daily. 10/31/18   [name], MD  Calcium Carb-Cholecalciferol (CALCIUM 600+D3 PO) Take 1 tablet by mouth 2 (two) times daily.    [provider]  Cholecalciferol (VITAMIN D3) 1000 UNITS CAPS Take 1,000 Units by mouth daily.  [provider]  diazepam (VALIUM) 5 MG tablet Take one tab one hour prior to procedure, may repeat x 1 if needed 03/17/19   [name], MD  levothyroxine (SYNTHROID) 88 MCG tablet TAKE 1 TABLET BY MOUTH EVERY DAY EXCEPT ON WEDNESDAY TAKE 1/2 TABLET 02/24/19   [name], MD  multivitamin with minerals (CERTA-VITE) LIQD Take 5 mLs by mouth daily. 03/20/19   [name], MD  simvastatin (ZOCOR) 20 MG tablet Take 1 tablet (20 mg total) by mouth at bedtime. 10/23/18   [name], MD  sodium fluoride (PREVIDENT 5000 PLUS) 1.1 % CREA dental cream Place gel into fluoride tray at bedtime.  Place over teeth for 5 minutes.  Remove.  Spit out excess.  Repeat nightly.  03/27/19   Lenn Cal, DDS  prochlorperazine (COMPAZINE) 10 MG tablet Take 1 tablet (10 mg total) by mouth every 6 (six) hours as needed (Nausea or vomiting). Patient not taking: Reported on 07/27/2016 05/01/16 09/18/16  [name], MD    Family History Family History  Problem Relation Age of Onset  . Osteoporosis Mother   . Leukemia Mother   . Cancer Maternal Uncle        ? stomach  . Aneurysm Father        AAA  . Heart attack Neg Hx     Social History Social History   Tobacco Use  . Smoking status: Never Smoker  . Smokeless tobacco: Never Used  Substance Use Topics  . Alcohol use: No    Comment:    . Drug use: No     Allergies   Other   Review of Systems Review of Systems  Constitutional: Negative for chills and fever.  HENT: Negative for ear pain and sore throat.   Eyes: Negative for pain and visual disturbance.  Respiratory: Negative for cough and shortness of breath.   Cardiovascular: Negative for chest pain and palpitations.  Gastrointestinal: Negative for abdominal pain and vomiting.  Genitourinary: Negative for dysuria and hematuria.  Musculoskeletal: Negative for arthralgias and back pain.  Skin: Negative for color change and rash.  Neurological: Negative for seizures and syncope.  All other systems reviewed and are negative.    Physical Exam Updated Vital Signs  ED Triage Vitals  Enc Vitals Group     BP      Pulse      Resp      Temp      Temp src      SpO2      Weight      Height      Head Circumference      Peak Flow      Pain Score      Pain Loc      Pain Edu?      Excl. in [place]?     Physical Exam Vitals signs and nursing note reviewed.  Constitutional:      General: She is not in acute distress.    Appearance: She is well-developed.  HENT:     Head: Normocephalic and atraumatic.     Mouth/Throat:     Mouth: Mucous membranes are dry.  Eyes:     Extraocular Movements: Extraocular movements intact.      Conjunctiva/sclera: Conjunctivae normal.     Pupils: Pupils are equal, round, and reactive to light.  Neck:     Musculoskeletal: Neck supple.  Cardiovascular:     Rate and Rhythm: Normal rate and regular rhythm.  Pulses: Normal pulses.     Heart sounds: Normal heart sounds. No murmur.  Pulmonary:     Effort: Pulmonary effort is normal. No respiratory distress.     Breath sounds: Normal breath sounds.  Abdominal:     Palpations: Abdomen is soft.     Tenderness: There is no abdominal tenderness.  Skin:    General: Skin is warm and dry.  Neurological:     General: No focal deficit present.     Mental Status: She is alert and oriented to person, place, and time.     Cranial Nerves: No cranial nerve deficit.     Sensory: No sensory deficit.     Motor: No weakness.      ED Treatments / Results  Labs (all labs ordered are listed, but only abnormal results are displayed) Labs Reviewed  SARS CORONAVIRUS 2 (HOSPITAL ORDER, [facility])  URINALYSIS, ROUTINE W REFLEX MICROSCOPIC  MAGNESIUM    EKG EKG Interpretation  Date/Time:  Friday March 28 2019 13:42:43 EDT Ventricular Rate:  72 PR Interval:    QRS Duration: 109 QT Interval:  394 QTC Calculation: 432 R Axis:   15 Text Interpretation:  Sinus rhythm Probable anteroseptal infarct, old Nonspecific repol abnormality, diffuse leads Confirmed by Lennice Sites [id number]) on 03/28/2019 1:46:03 PM   Radiology Dg Chest Portable 1 View  Result Date: 03/28/2019 CLINICAL DATA:  Weakness and hyponatremia. History of throat carcinoma EXAM: PORTABLE CHEST 1 VIEW COMPARISON:  August 29, 2010 FINDINGS: Lungs are clear. Heart size and pulmonary vascularity are normal. No adenopathy. There are surgical clips in left axillary region. Bones are osteoporotic. IMPRESSION: No edema or consolidation.  No evident adenopathy. Electronically Signed   By: Lowella Grip III M.D.   On: 03/28/2019 13:41    Procedures  .Critical Care Performed by: Lennice Sites, DO Authorized by: Lennice Sites, DO   Critical care provider statement:    Critical care time (minutes):  35   Critical care was necessary to treat or prevent imminent or life-threatening deterioration of the following conditions:  Metabolic crisis   Critical care was time spent personally by me on the following activities:  Blood draw for specimens, development of treatment plan with patient or surrogate, discussions with primary provider, evaluation of patient's response to treatment, examination of patient, ordering and performing treatments and interventions, ordering and review of laboratory studies, ordering and review of radiographic studies, re-evaluation of patient's condition, review of old charts, pulse oximetry and obtaining history from patient or surrogate   I assumed direction of critical care for this patient from another provider in my specialty: no     (including critical care time)  Medications Ordered in ED Medications  0.9 %  sodium chloride infusion (has no administration in time range)     Initial Impression / Assessment and Plan / ED Course  I have reviewed the triage vital signs and the nursing notes.  Pertinent labs & imaging results that were available during my care of the patient were reviewed by me and considered in my medical decision making (see chart for details).     Jessica Sherman is an 86 year old female with history of breast cancer, newly diagnosed tongue cancer who presents the ED with hyponatremia from oncology office.  Patient with unremarkable vitals.  No fever.  I talked with the patient's oncologist on the phone who states that hyponatremia likely secondary to dehydration.  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  Patient overall asymptomatic except for  some fatigue.  She states that it has been harder to eat or drink since diagnosed with this.  Overall she appears well.  No seizures.  Sodium several  months ago was 134. Today sodium is 120. Therefore overall this is a dramatic change.  Otherwise electrolytes are unremarkable.  Lab work was done already this morning.  Will add on a chest x-ray.  EKG shows sinus rhythm.  No ischemic changes.  Intervals are normal.  Will add on urinalysis, magnesium.  Will add on COVID test.  Will admit the patient to hospitalist service at [facility] long for further hydration and treatment.  Patient started on normal saline infusion at this time.  Chest x-ray shows no acute findings.  Patient hemodynamically stable throughout my care.  Neurologically stable as well.  This chart was dictated using voice recognition software.  Despite best efforts to proofread,  errors can occur which can change the documentation meaning.    Final Clinical Impressions(s) / ED Diagnoses   Final diagnoses:  Hyponatremia  Dehydration    ED Discharge Orders    None       Lennice Sites, DO 03/28/19 1349

## 2019-03-28 NOTE — H&P (Signed)
Jessica Sherman KLK:[id number] DOB: 1931-04-25 DOA: 03/28/2019     PCP: [name], MD   Outpatient Specialists:  CARDS:   Dr.Bensimhon  .  Oncology  Dr.Gudena     Dr. Isidore Moos of radiation oncology Patient arrived to ER on 03/28/19 at 1318  Patient coming from: home Lives   With family    Chief Complaint:  Chief Complaint  Patient presents with  . Abnormal Lab    HPI: Jessica Sherman is a 85 y.o. female with medical history significant of dysphagia, HTN,  anemia of chronic disease, remote breast cancer status post mastectomy and recently diagnosed based with home cancer    Presented with  Abnormal Sodium  Patient have had several months of dysphagia with decreased p.o. intake she was seen by her oncologist that noted that she has low sodium down to 120 which she felt secondary to dehydration Plan regarding her base of the tongue malignancy was to undergo radiation therapy thinking that she may not be a good candidate for chemotherapy Patient was sent to emergency department for further evaluation and rehydration Denies any fever or chills No chest pain No lightheadedness when she stands up  Infectious risk factors:  Reports none   In  ER RAPID COVID TEST NEGATIVE initially at [facility] in house testing   Pending     Regarding pertinent Chronic problems:    HLD - on zocor   HTN on NOrvasc    Hypothyroidism:  Lab Results  Component Value Date   TSH 0.47 01/27/2019   on synthroid    While in ER: X-ray unremarkable Repeat sodium is 120 Initial call with testing negative The following Work up has been ordered so far:  Orders Placed This Encounter  Procedures  . Critical Care  . SARS Coronavirus 2 (Performed in [facility] hospital lab)  . SARS Coronavirus 2 (CEPHEID - Performed in [facility] hospital lab), [facility]  . Urine Culture  . DG Chest Portable 1 View  . Urinalysis, Routine w reflex microscopic  . Magnesium  . Urinalysis, Microscopic  (reflex)  . Creatinine, urine, random  . Sodium, urine, random  . Osmolality, urine  . Basic metabolic panel  . Prealbumin  . Osmolality  . Magnesium  . Phosphorus  . TSH  . Comprehensive metabolic panel  . CBC  . Basic metabolic panel  . Cortisol-am, blood  . Diet Heart Room service appropriate? Yes; Fluid consistency: Thin  . Orthostatic vital signs  . Vital signs  . Notify physician  . Up with assistance  . If patient diabetic or glucose greater than 140 notify physician for Sliding Scale Insulin Orders  . Oral care per nursing protocol  . Initiate Oral Care Protocol  . Initiate Carrier Fluid Protocol  . RN may order General Admission PRN Orders utilizing "General Admission PRN medications" (through manage orders) for the following patient needs: allergy symptoms (Claritin), cold sores (Carmex), cough (Robitussin DM), eye irritation (Liquifilm Tears), hemorrhoids (Tucks), indigestion (Maalox), minor skin irritation (Hydrocortisone Cream), muscle pain Suezanne Jacquet Gay), nose irritation (saline nasal spray) and sore throat (Chloraseptic spray).  . Patient has an active order for admit to inpatient/place in observation  . Full code  . Consult to hospitalist  ALL PATIENTS BEING ADMITTED/HAVING PROCEDURES NEED COVID-19 SCREENING  . Consult to dietitian  . Nutritional services consult  . Droplet and Contact precautions  . OT eval and treat  . PT eval and treat  . Pulse oximetry check with vital  signs  . Oxygen therapy Mode or (Route): Nasal cannula; Liters Per Minute: 2; Keep 02 saturation: greater than 92 %  . Incentive spirometry  . SLP eval and treat Reason for evaluation: .Swallowing evaluation (BSE, MBS and/or diet order as indicated)  . ED EKG  . EKG 12-Lead  . EKG 12-Lead  . Place in observation (patient's expected length of stay will be less than 2 midnights)    Following Medications were ordered in ER: Medications  0.9 %  sodium chloride infusion (has no administration in  time range)  amLODipine (NORVASC) tablet 5 mg (has no administration in time range)  simvastatin (ZOCOR) tablet 20 mg (has no administration in time range)  levothyroxine (SYNTHROID) tablet 88 mcg (has no administration in time range)  acetaminophen (TYLENOL) tablet 650 mg (has no administration in time range)    Or  acetaminophen (TYLENOL) suppository 650 mg (has no administration in time range)  HYDROcodone-acetaminophen (NORCO/VICODIN) 5-325 MG per tablet 1-2 tablet (has no administration in time range)  ondansetron (ZOFRAN) tablet 4 mg (has no administration in time range)    Or  ondansetron (ZOFRAN) injection 4 mg (has no administration in time range)  enoxaparin (LOVENOX) injection 40 mg (has no administration in time range)  docusate sodium (COLACE) capsule 100 mg (has no administration in time range)  senna (SENOKOT) tablet 8.6 mg (has no administration in time range)  levothyroxine (SYNTHROID) tablet 44 mcg (has no administration in time range)  0.9 %  sodium chloride infusion ( Intravenous Transfusing/Transfer 03/28/19 1659)        Consult Orders  (From admission, onward)         Start     Ordered   03/28/19 2139  OT eval and treat  Routine    Question:  Reason for OT?  Answer:  safe discharge   03/28/19 2138   03/28/19 2139  SLP eval and treat Reason for evaluation: .Swallowing evaluation (BSE, MBS and/or diet order as indicated)  Once    Question:  Reason for evaluation  Answer:  .Swallowing evaluation (BSE, MBS and/or diet order as indicated)   03/28/19 2138   03/28/19 2138  Nutritional services consult  Once    Provider:  (Not yet assigned)  Question:  Reason for Consult?  Answer:  malnutrition   03/28/19 2138   03/28/19 2138  PT eval and treat  Routine    Question:  Reason for PT?  Answer:  safe discharge   03/28/19 2138   03/28/19 2100  Consult to dietitian  Once    Provider:  (Not yet assigned)  Question:  Reason for consult?  Answer:  Assessment of nutrition  requirements/status   03/28/19 2059   03/28/19 1342  Consult to hospitalist  ALL PATIENTS BEING ADMITTED/HAVING PROCEDURES NEED COVID-19 SCREENING Call made via [place], spoke with  Cassie @ 145  Once    Comments: ALL PATIENTS BEING ADMITTED/HAVING PROCEDURES NEED COVID-19 SCREENING  Provider:  (Not yet assigned)  Question Answer Comment  Place call to: [facility]   Reason for Consult Admit   Diagnosis/Clinical Info for Consult: Hyponatremia      03/28/19 1341           Significant initial  Findings: Abnormal Labs Reviewed  URINALYSIS, ROUTINE W REFLEX MICROSCOPIC - Abnormal; Notable for the following components:      Result Value   APPearance CLOUDY (*)    Leukocytes,Ua MODERATE (*)    All other components within normal limits  URINALYSIS, MICROSCOPIC (REFLEX) -  Abnormal; Notable for the following components:   Bacteria, UA MANY (*)    All other components within normal limits  BASIC METABOLIC PANEL - Abnormal; Notable for the following components:   Sodium 134 (*)    Glucose, Bld 106 (*)    All other components within normal limits    Otherwise labs showing:    Recent Labs  Lab 03/28/19 1148 03/28/19 1417 03/28/19 2105  NA 120*  --  134*  K 3.6  --  3.9  CO2 30  --  26  GLUCOSE 140*  --  106*  BUN 18  --  15  CREATININE 0.75  --  0.55  CALCIUM 9.4  --  9.1  MG  --  2.2  --     Cr   Stable,  Lab Results  Component Value Date   CREATININE 0.55 03/28/2019   CREATININE 0.75 03/28/2019   CREATININE 0.71 03/13/2019    Recent Labs  Lab 03/28/19 1148  AST 15  ALT 13  ALKPHOS 69  BILITOT 0.4  PROT 7.3  ALBUMIN 4.1   Lab Results  Component Value Date   CALCIUM 9.1 03/28/2019      WBC      Component Value Date/Time   WBC 14.4 (H) 03/28/2019 1148   WBC 9.3 01/27/2019 1424   ANC    Component Value Date/Time   NEUTROABS 10.7 (H) 03/28/2019 1148   NEUTROABS 4.0 05/01/2017 1011   ALC No results found for: LYMPHOABS    Plt: Lab Results   Component Value Date   PLT 250 03/28/2019     Lactic Acid, Venous    Component Value Date/Time   LATICACIDVEN 1.4 05/31/2013 0500     COVID-19 Labs     Lab Results  Component Value Date   SARSCOV2NAA NEGATIVE 03/28/2019    HG/HCT   stable,      Component Value Date/Time   HGB 11.8 (L) 03/28/2019 1148   HGB 11.8 05/01/2017 1011   HCT 36.1 03/28/2019 1148   HCT 35.2 05/01/2017 1011         CBG (last 3)  No results for input(s): GLUCAP in the last 72 hours.     UA  possible of UTI     Urine analysis:    Component Value Date/Time   COLORURINE YELLOW 03/28/2019 1815   APPEARANCEUR CLOUDY (A) 03/28/2019 1815   LABSPEC 1.010 03/28/2019 1815   PHURINE 6.5 03/28/2019 1815   GLUCOSEU NEGATIVE 03/28/2019 1815   HGBUR NEGATIVE 03/28/2019 1815   HGBUR negative 11/06/2008 0810   BILIRUBINUR NEGATIVE 03/28/2019 1815   KETONESUR NEGATIVE 03/28/2019 1815   PROTEINUR NEGATIVE 03/28/2019 1815   UROBILINOGEN 0.2 05/30/2013 1331   NITRITE NEGATIVE 03/28/2019 1815   LEUKOCYTESUR MODERATE (A) 03/28/2019 1815      CXR -  NON acute    ECG:  Personally reviewed by me showing: HR : 72 Rhythm:  NSR   nonspecific changes,  [id number]     ED Triage Vitals  Enc Vitals Group     BP 03/28/19 1342 (!) 161/76     Pulse Rate 03/28/19 1342 78     Resp 03/28/19 1342 16     Temp 03/28/19 1342 97.9 F (36.6 C)     Temp Source 03/28/19 1342 Oral     SpO2 03/28/19 1342 100 %     Weight 03/28/19 1343 106 lb (48.1 kg)     Height 03/28/19 1343 5\' 3"  (1.6 m)     Head  Circumference --      Peak Flow --      Pain Score 03/28/19 1343 0     Pain Loc --      Pain Edu? --      Excl. in [place]? --   TMAX(24)@       Latest  Blood pressure (!) 164/70, pulse 71, temperature 97.9 F (36.6 C), temperature source Oral, resp. rate 11, height 5\' 3"  (1.6 m), weight 48.1 kg, SpO2 100 %.     Hospitalist was called for admission for hyponatremia   Review of Systems:    Pertinent positives  include: fatigue, weight loss dysphagia  Constitutional:  No weight loss, night sweats, Fevers, chills, HEENT:  No headaches, Difficulty swallowing,Tooth/dental problems,Sore throat,  No sneezing, itching, ear ache, nasal congestion, post nasal drip,  Cardio-vascular:  No chest pain, Orthopnea, PND, anasarca, dizziness, palpitations.no Bilateral lower extremity swelling  GI:  No heartburn, indigestion, abdominal pain, nausea, vomiting, diarrhea, change in bowel habits, loss of appetite, melena, blood in stool, hematemesis Resp:  no shortness of breath at rest. No dyspnea on exertion, No excess mucus, no productive cough, No non-productive cough, No coughing up of blood.No change in color of mucus.No wheezing. Skin:  no rash or lesions. No jaundice GU:  no dysuria, change in color of urine, no urgency or frequency. No straining to urinate.  No flank pain.  Musculoskeletal:  No joint pain or no joint swelling. No decreased range of motion. No back pain.  Psych:  No change in mood or affect. No depression or anxiety. No memory loss.  Neuro: no localizing neurological complaints, no tingling, no weakness, no double vision, no gait abnormality, no slurred speech, no confusion  All systems reviewed and apart from [place] all are negative  Past Medical History:   Past Medical History:  Diagnosis Date  . Hx of colonic polyps   . Hyperlipidemia   . Hypothyroidism   . Macular degeneration, dry   . Osteopenia    spine BMD T score - 1.69  . PONV (postoperative nausea and vomiting)    pt has vertigo and can not lie flat  . Prediabetes    FBS 136  on 08/29/2010; A1c 6.2%  . Vertigo       Past Surgical History:  Procedure Laterality Date  . CATARACT EXTRACTION  2009   OS  . COLONOSCOPY W/ POLYPECTOMY     X 2; last 2012. Dr Watt Climes  . IR GENERIC HISTORICAL  05/09/2016   IR US GUIDE VASC ACCESS RIGHT 05/09/2016 Jacqulynn Cadet, MD [facility]  . IR GENERIC HISTORICAL  05/09/2016   IR  FLUORO GUIDE CV LINE RIGHT 05/09/2016 Jacqulynn Cadet, MD [facility]  . MODIFIED MASTECTOMY Left 10/18/2016   Procedure: LEFT MODIFIED RADICAL MASTECTOMY;  Surgeon: [name], MD;  Location: [facility];  Service: General;  Laterality: Left;  . PILONIDAL CYST EXCISION     X 3  . TONSILLECTOMY AND ADENOIDECTOMY    . TOTAL ABDOMINAL HYSTERECTOMY     w/ bladder repair    Social History:  Ambulatory independently    reports that she has never smoked. She has never used smokeless tobacco. She reports that she does not drink alcohol or use drugs.    Family History:   Family History  Problem Relation Age of Onset  . Osteoporosis Mother   . Leukemia Mother   . Cancer Maternal Uncle        ? stomach  . Aneurysm Father  AAA  . Heart attack Neg Hx     Allergies: Allergies  Allergen Reactions  . Other Nausea And Vomiting    Anesthesia "makes me very sick", Please pre-medicate to control nausea and vomiting     Prior to Admission medications   Medication Sig Start Date End Date Taking? Authorizing Provider  amLODipine (NORVASC) 5 MG tablet Take 1 tablet (5 mg total) by mouth daily. Patient taking differently: Take 5 mg by mouth at bedtime.  10/31/18  Yes [name], MD  Calcium Carb-Cholecalciferol (CALCIUM 600+D3 PO) Take 1 tablet by mouth 2 (two) times daily.   Yes [provider]  Cholecalciferol (VITAMIN D3) 1000 UNITS CAPS Take 1,000 Units by mouth daily.    Yes [provider]  levothyroxine (SYNTHROID) 88 MCG tablet TAKE 1 TABLET BY MOUTH EVERY DAY EXCEPT ON WEDNESDAY TAKE 1/2 TABLET Patient taking differently: Take 88 mcg by mouth See admin instructions. TAKE 1 TABLET BY MOUTH EVERY DAY EXCEPT ON WEDNESDAY TAKE 1/2 TABLET 02/24/19  Yes Burns, Claudina Lick, MD  simvastatin (ZOCOR) 20 MG tablet Take 1 tablet (20 mg total) by mouth at bedtime. 10/23/18  Yes Burns, Claudina Lick, MD  sodium fluoride (PREVIDENT 5000 PLUS) 1.1 % CREA dental cream  Place gel into fluoride tray at bedtime.  Place over teeth for 5 minutes.  Remove.  Spit out excess.  Repeat nightly. 03/27/19  Yes Lenn Cal, DDS  diazepam (VALIUM) 5 MG tablet Take one tab one hour prior to procedure, may repeat x 1 if needed Patient not taking: Reported on 03/28/2019 03/17/19   [name], MD  multivitamin with minerals (CERTA-VITE) LIQD Take 5 mLs by mouth daily. 03/20/19   [name], MD  prochlorperazine (COMPAZINE) 10 MG tablet Take 1 tablet (10 mg total) by mouth every 6 (six) hours as needed (Nausea or vomiting). Patient not taking: Reported on 07/27/2016 05/01/16 09/18/16  [name], MD   Physical Exam: Blood pressure (!) 164/70, pulse 71, temperature 97.9 F (36.6 C), temperature source Oral, resp. rate 11, height 5\' 3"  (1.6 m), weight 48.1 kg, SpO2 100 %. 1. General:  in No Acute distress    Chronically ill cachectic  -appearing 2. Psychological: Alert and  Oriented 3. Head/ENT:    Dry Mucous Membranes                          Head Non traumatic, neck supple                            Poor Dentition 4. SKIN:  decreased Skin turgor,  Skin clean Dry and intact no rash 5. Heart: Regular rate and rhythm slight systolic  Murmur, no Rub or gallop 6. Lungs:  Clear to auscultation bilaterally, no wheezes or crackles   7. Abdomen: Soft,  non-tender, Non distended  bowel sounds present 8. Lower extremities: no clubbing, cyanosis, no edema 9. Neurologically Grossly intact, moving all 4 extremities equally  10. MSK: Normal range of motion   All other LABS:     Recent Labs  Lab 03/28/19 1148  WBC 14.4*  NEUTROABS 10.7*  HGB 11.8*  HCT 36.1  MCV 95.5  PLT 250     Recent Labs  Lab 03/28/19 1148 03/28/19 1417 03/28/19 2105  NA 120*  --  134*  K 3.6  --  3.9  CL 84*  --  100  CO2 30  --  26  GLUCOSE 140*  --  106*  BUN 18  --  15  CREATININE 0.75  --  0.55  CALCIUM 9.4  --  9.1  MG  --  2.2  --      Recent Labs  Lab 03/28/19 1148  AST  15  ALT 13  ALKPHOS 69  BILITOT 0.4  PROT 7.3  ALBUMIN 4.1       Cultures:    Component Value Date/Time   SDES STOOL 05/31/2013 0851   SPECREQUEST NONE 05/31/2013 0851   CULT  05/30/2013 1915    NO GROWTH 5 DAYS Performed at [facility] 06/01/2013 FINAL 05/31/2013 0851     Radiological Exams on Admission: Dg Chest Portable 1 View  Result Date: 03/28/2019 CLINICAL DATA:  Weakness and hyponatremia. History of throat carcinoma EXAM: PORTABLE CHEST 1 VIEW COMPARISON:  August 29, 2010 FINDINGS: Lungs are clear. Heart size and pulmonary vascularity are normal. No adenopathy. There are surgical clips in left axillary region. Bones are osteoporotic. IMPRESSION: No edema or consolidation.  No evident adenopathy. Electronically Signed   By: Lowella Grip III M.D.   On: 03/28/2019 13:41    Chart has been reviewed    Assessment/Plan  85 y.o. female with medical history significant of dysphagia, HTN,  anemia of chronic disease, remote breast cancer status post mastectomy and recently diagnosed based with home cancer Admitted for Hyponatremia  Present on Admission: . Hyponatremia -was likely secondary to dehydration secondary to decreased p.o. intake will rehydrate monitor carefully sodium levels with serial labs.  Check TSH check a.m. cortisol level  . Hypothyroidism - - Check TSH continue home medications at current dose  Abnormal UA no history of dysuria or abdominal discomfort of fevers but will obtain urine culture and treat if needed  . HYPERLIPIDEMIA - stable continue home medications   . Breast cancer of upper-outer quadrant of left female breast ([place]) - chronic currently in remission  .  Essential hypertension-    stable continue home medications   . Oropharyngeal dysphagia -will have speech pathology evaluate prior to discharge  . Cancer of base of tongue ([place]) -patient is being seen by oncology was in the office earlier today    .  Dehydration -  - we'll administer IV fluids and recheck orthostatics in the morning    Other plan as per orders.  DVT prophylaxis:  lovenox    Code Status:  FULL CODE   as per patient   I had personally discussed CODE STATUS with patient   Family Communication:   Family not at  Bedside    Disposition Plan:       To home once workup is complete and patient is stable  Nutritional consult, PT/OT,SLP                   Consults called: none  Admission status:  ED Disposition    ED Disposition Condition [place]: [place] [100102]  Level of Care: Med-Surg [16]  Covid Evaluation: Asymptomatic Screening Protocol (No Symptoms)  Diagnosis: Hyponatremia [825053]  Admitting Physician: Darliss Cheney [[id number]]  Attending Physician: Darliss Cheney [[id number]]  PT Class (Do Not Modify): Observation [104]  PT Acc Code (Do Not Modify): Observation [10022]       Obs    Level of care       medical floor     Precautions:  Droplet,   Droplet and Contact precautions  PPE: Used by the provider:   P100  eye Goggles,  Gloves  gown      Toy Baker 03/28/2019, 10:39 PM    [facility]     after 2 AM please page floor coverage PA If 7AM-7PM, please contact the day team taking care of the patient using [email or web address]

## 2019-03-28 NOTE — ED Notes (Signed)
Pt has no complaints

## 2019-03-28 NOTE — ED Notes (Signed)
Report to Amy, RN at [facility] (Rm [id number])

## 2019-03-28 NOTE — ED Notes (Signed)
Pt ambulated to bathroom with steady gait. 

## 2019-03-28 NOTE — ED Triage Notes (Signed)
Pt sent from oncologist today re: low sodium

## 2019-03-28 NOTE — Telephone Encounter (Signed)
Return to be determined PER 7/17 LOS

## 2019-03-29 DIAGNOSIS — Z171 Estrogen receptor negative status [ER-]: Secondary | ICD-10-CM | POA: Diagnosis not present

## 2019-03-29 DIAGNOSIS — E039 Hypothyroidism, unspecified: Secondary | ICD-10-CM

## 2019-03-29 DIAGNOSIS — E860 Dehydration: Secondary | ICD-10-CM | POA: Diagnosis not present

## 2019-03-29 DIAGNOSIS — E782 Mixed hyperlipidemia: Secondary | ICD-10-CM

## 2019-03-29 DIAGNOSIS — E871 Hypo-osmolality and hyponatremia: Secondary | ICD-10-CM

## 2019-03-29 DIAGNOSIS — C01 Malignant neoplasm of base of tongue: Secondary | ICD-10-CM | POA: Diagnosis not present

## 2019-03-29 DIAGNOSIS — C50412 Malignant neoplasm of upper-outer quadrant of left female breast: Secondary | ICD-10-CM

## 2019-03-29 DIAGNOSIS — I10 Essential (primary) hypertension: Secondary | ICD-10-CM | POA: Diagnosis not present

## 2019-03-29 DIAGNOSIS — R1312 Dysphagia, oropharyngeal phase: Secondary | ICD-10-CM

## 2019-03-29 LAB — CBC
HCT: 34.8 % — ABNORMAL LOW (ref 36.0–46.0)
Hemoglobin: 11.5 g/dL — ABNORMAL LOW (ref 12.0–15.0)
MCH: 33 pg (ref 26.0–34.0)
MCHC: 33 g/dL (ref 30.0–36.0)
MCV: 99.7 fL (ref 80.0–100.0)
Platelets: 332 10*3/uL (ref 150–400)
RBC: 3.49 MIL/uL — ABNORMAL LOW (ref 3.87–5.11)
RDW: 12.1 % (ref 11.5–15.5)
WBC: 11.8 10*3/uL — ABNORMAL HIGH (ref 4.0–10.5)
nRBC: 0.5 % — ABNORMAL HIGH (ref 0.0–0.2)

## 2019-03-29 LAB — COMPREHENSIVE METABOLIC PANEL
ALT: 15 U/L (ref 0–44)
AST: 18 U/L (ref 15–41)
Albumin: 3.6 g/dL (ref 3.5–5.0)
Alkaline Phosphatase: 65 U/L (ref 38–126)
Anion gap: 9 (ref 5–15)
BUN: 13 mg/dL (ref 8–23)
CO2: 27 mmol/L (ref 22–32)
Calcium: 9.8 mg/dL (ref 8.9–10.3)
Chloride: 96 mmol/L — ABNORMAL LOW (ref 98–111)
Creatinine, Ser: 0.65 mg/dL (ref 0.44–1.00)
GFR calc Af Amer: >60 mL/min (ref >60)
GFR calc non Af Amer: >60 mL/min (ref >60)
Glucose, Bld: 101 mg/dL — ABNORMAL HIGH (ref 70–99)
Potassium: 4.4 mmol/L (ref 3.5–5.1)
Sodium: 132 mmol/L — ABNORMAL LOW (ref 135–145)
Total Bilirubin: 0.8 mg/dL (ref 0.3–1.2)
Total Protein: 7.6 g/dL (ref 6.5–8.1)

## 2019-03-29 LAB — PHOSPHORUS: Phosphorus: 3.5 mg/dL (ref 2.5–4.6)

## 2019-03-29 LAB — PREALBUMIN: Prealbumin: 25.9 mg/dL (ref 18–38)

## 2019-03-29 LAB — BASIC METABOLIC PANEL
Anion gap: 10 (ref 5–15)
BUN: 13 mg/dL (ref 8–23)
CO2: 26 mmol/L (ref 22–32)
Calcium: 9.3 mg/dL (ref 8.9–10.3)
Chloride: 94 mmol/L — ABNORMAL LOW (ref 98–111)
Creatinine, Ser: 0.67 mg/dL (ref 0.44–1.00)
GFR calc Af Amer: >60 mL/min (ref >60)
GFR calc non Af Amer: >60 mL/min (ref >60)
Glucose, Bld: 127 mg/dL — ABNORMAL HIGH (ref 70–99)
Potassium: 4.3 mmol/L (ref 3.5–5.1)
Sodium: 130 mmol/L — ABNORMAL LOW (ref 135–145)

## 2019-03-29 LAB — TSH: TSH: 1.281 u[IU]/mL (ref 0.350–4.500)

## 2019-03-29 LAB — OSMOLALITY, URINE: Osmolality, Ur: 453 mOsm/kg (ref 300–900)

## 2019-03-29 LAB — OSMOLALITY: Osmolality: 277 mOsm/kg (ref 275–295)

## 2019-03-29 LAB — SODIUM, URINE, RANDOM: Sodium, Ur: 150 mmol/L

## 2019-03-29 LAB — CORTISOL-AM, BLOOD: Cortisol - AM: 21.8 ug/dL (ref 6.7–22.6)

## 2019-03-29 LAB — CREATININE, URINE, RANDOM: Creatinine, Urine: 37.15 mg/dL

## 2019-03-29 LAB — MAGNESIUM: Magnesium: 2.2 mg/dL (ref 1.7–2.4)

## 2019-03-29 MED ORDER — BOOST / RESOURCE BREEZE PO LIQD CUSTOM
1.0000 | Freq: Three times a day (TID) | ORAL | Status: DC
  Administered 2019-03-29: 1 via ORAL

## 2019-03-29 NOTE — Care Management Obs Status (Signed)
[place] NOTIFICATION   Patient Details  Name: Jessica Sherman MRN: [id number] Date of Birth: [birth year]   Medicare Observation Status Notification Given:  Yes    Joaquin Courts, RN 03/29/2019, 9:40 AM

## 2019-03-29 NOTE — Plan of Care (Signed)
  Problem: Education: Goal: Knowledge of General Education information will improve Description: Including pain rating scale, medication(s)/side effects and non-pharmacologic comfort measures Outcome: Progressing   Problem: Health Behavior/Discharge Planning: Goal: Ability to manage health-related needs will improve Outcome: Progressing   Problem: Clinical Measurements: Goal: Will remain free from infection Outcome: Progressing Goal: Diagnostic test results will improve Outcome: Progressing   Problem: Coping: Goal: Level of anxiety will decrease Outcome: Progressing

## 2019-03-29 NOTE — Discharge Instructions (Signed)
Dehydration, Adult  Dehydration is a condition in which there is not enough fluid or water in the body. This happens when you lose more fluids than you take in. Important organs, such as the kidneys, brain, and heart, cannot function without a proper amount of fluids. Any loss of fluids from the body can lead to dehydration. Dehydration can range from mild to severe. This condition should be treated right away to prevent it from becoming severe. What are the causes? This condition may be caused by:  Vomiting.  Diarrhea.  Excessive sweating, such as from heat exposure or exercise.  Not drinking enough fluid, especially: ? When ill. ? While doing activity that requires a lot of energy.  Excessive urination.  Fever.  Infection.  Certain medicines, such as medicines that cause the body to lose excess fluid (diuretics).  Inability to access safe drinking water.  Reduced physical ability to get adequate water and food. What increases the risk? This condition is more likely to develop in people:  Who have a poorly controlled long-term (chronic) illness, such as diabetes, heart disease, or kidney disease.  Who are age 38 or older.  Who are disabled.  Who live in a place with high altitude.  Who play endurance sports. What are the signs or symptoms? Symptoms of mild dehydration may include:  Thirst.  Dry lips.  Slightly dry mouth.  Dry, warm skin.  Dizziness. Symptoms of moderate dehydration may include:  Very dry mouth.  Muscle cramps.  Dark urine. Urine may be the color of tea.  Decreased urine production.  Decreased tear production.  Heartbeat that is irregular or faster than normal (palpitations).  Headache.  Light-headedness, especially when you stand up from a sitting position.  Fainting (syncope). Symptoms of severe dehydration may include:  Changes in skin, such as: ? Cold and clammy skin. ? Blotchy (mottled) or pale skin. ? Skin that does  not quickly return to normal after being lightly pinched and released (poor skin turgor).  Changes in body fluids, such as: ? Extreme thirst. ? No tear production. ? Inability to sweat when body temperature is high, such as in hot weather. ? Very little urine production.  Changes in vital signs, such as: ? Weak pulse. ? Pulse that is more than 100 beats a minute when sitting still. ? Rapid breathing. ? Low blood pressure.  Other changes, such as: ? Sunken eyes. ? Cold hands and feet. ? Confusion. ? Lack of energy (lethargy). ? Difficulty waking up from sleep. ? Short-term weight loss. ? Unconsciousness. How is this diagnosed? This condition is diagnosed based on your symptoms and a physical exam. Blood and urine tests may be done to help confirm the diagnosis. How is this treated? Treatment for this condition depends on the severity. Mild or moderate dehydration can often be treated at home. Treatment should be started right away. Do not wait until dehydration becomes severe. Severe dehydration is an emergency and it needs to be treated in a hospital. Treatment for mild dehydration may include:  Drinking more fluids.  Replacing salts and minerals in your blood (electrolytes) that you may have lost. Treatment for moderate dehydration may include:  Drinking an oral rehydration solution (ORS). This is a drink that helps you replace fluids and electrolytes (rehydrate). It can be found at pharmacies and retail stores. Treatment for severe dehydration may include:  Receiving fluids through an IV tube.  Receiving an electrolyte solution through a feeding tube that is passed through your nose and  into your stomach (nasogastric tube, or NG tube).  Correcting any abnormalities in electrolytes.  Treating the underlying cause of dehydration. Follow these instructions at home:  If directed by your health care provider, drink an ORS: ? Make an ORS by following instructions on the  package. ? Start by drinking small amounts, about  cup (120 mL) every 5-10 minutes. ? Slowly increase how much you drink until you have taken the amount recommended by your health care provider.  Drink enough clear fluid to keep your urine clear or pale yellow. If you were told to drink an ORS, finish the ORS first, then start slowly drinking other clear fluids. Drink fluids such as: ? Water. Do not drink only water. Doing that can lead to having too little salt (sodium) in the body (hyponatremia). ? Ice chips. ? Fruit juice that you have added water to (diluted fruit juice). ? Low-calorie sports drinks.  Avoid: ? Alcohol. ? Drinks that contain a lot of sugar. These include high-calorie sports drinks, fruit juice that is not diluted, and soda. ? Caffeine. ? Foods that are greasy or contain a lot of fat or sugar.  Take over-the-counter and prescription medicines only as told by your health care provider.  Do not take sodium tablets. This can lead to having too much sodium in the body (hypernatremia).  Eat foods that contain a healthy balance of electrolytes, such as bananas, oranges, potatoes, tomatoes, and spinach.  Keep all follow-up visits as told by your health care provider. This is important. Contact a health care provider if:  You have abdominal pain that: ? Gets worse. ? Stays in one area (localizes).  You have a rash.  You have a stiff neck.  You are more irritable than usual.  You are sleepier or more difficult to wake up than usual.  You feel weak or dizzy.  You feel very thirsty.  You have urinated only a small amount of very dark urine over 6-8 hours. Get help right away if:  You have symptoms of severe dehydration.  You cannot drink fluids without vomiting.  Your symptoms get worse with treatment.  You have a fever.  You have a severe headache.  You have vomiting or diarrhea that: ? Gets worse. ? Does not go away.  You have blood or green matter  (bile) in your vomit.  You have blood in your stool. This may cause stool to look black and tarry.  You have not urinated in 6-8 hours.  You faint.  Your heart rate while sitting still is over 100 beats a minute.  You have trouble breathing. This information is not intended to replace advice given to you by your health care provider. Make sure you discuss any questions you have with your health care provider. Document Released: 08/28/2005 Document Revised: 08/10/2017 Document Reviewed: 10/22/2015 Elsevier Patient Education  [place].   Hyponatremia Hyponatremia is when the amount of salt (sodium) in your blood is too low. When salt levels are low, your body may take in extra water. This can cause swelling throughout the body. The swelling often affects the brain. What are the causes? This condition may be caused by:  Certain medical problems or conditions.  Vomiting a lot.  Having watery poop (diarrhea) often.  Certain medicines or illegal drugs.  Not having enough water in the body (dehydration).  Drinking too much water.  Eating a diet that is low in salt.  Large burns on your body.  Too much sweating.  What increases the risk? You are more likely to get this condition if you:  Have long-term (chronic) kidney disease.  Have heart failure.  Have a medical condition that causes you to have watery poop often.  Do very hard exercises.  Take medicines that affect the amount of salt is in your blood. What are the signs or symptoms? Symptoms of this condition include:  Headache.  Feeling like you may vomit (nausea).  Vomiting.  Being very tired (lethargic).  Muscle weakness and cramps.  Not wanting to eat as much as normal (loss of appetite).  Feeling weak or light-headed. Severe symptoms of this condition include:  Confusion.  Feeling restless (agitation).  Having a fast heart rate.  Passing out (fainting).  Seizures.  Coma. How is  this treated? Treatment for this condition depends on the cause. Treatment may include:  Getting fluids through an IV tube that is put into one of your veins.  Taking medicines to fix the salt levels in your blood. If medicines are causing the problem, your medicines will need to be changed.  Limiting how much water or fluid you take in.  Monitoring in the hospital to watch your symptoms. Follow these instructions at home:   Take over-the-counter and prescription medicines only as told by your doctor. Many medicines can make this condition worse. Talk with your doctor about any medicines that you are taking.  Eat and drink exactly as you are told by your doctor. ? Eat only the foods you are told to eat. ? Limit how much fluid you take.  Do not drink alcohol.  Keep all follow-up visits as told by your doctor. This is important. Contact a doctor if:  You feel more like you may vomit.  You feel more tired.  Your headache gets worse.  You feel more confused.  You feel weaker.  Your symptoms go away and then they come back.  You have trouble following the diet instructions. Get help right away if:  You have a seizure.  You pass out.  You keep having watery poop.  You keep vomiting. Summary  Hyponatremia is when the amount of salt in your blood is too low.  When salt levels are low, you can have swelling throughout the body. The swelling mostly affects the brain.  Treatment depends on the cause. Treatment may include getting IV fluids, medicines, or not drinking as much fluid. This information is not intended to replace advice given to you by your health care provider. Make sure you discuss any questions you have with your health care provider. Document Released: 05/10/2011 Document Revised: 11/14/2018 Document Reviewed: 08/01/2018 Elsevier Patient Education  2020 [employer].

## 2019-03-29 NOTE — Discharge Summary (Signed)
Physician Discharge Summary  Jessica Sherman GEX:[id number] DOB: 18-Feb-1931 DOA: 03/28/2019  PCP: Pincus Sanes, MD  Admit date: 03/28/2019 Discharge date: 03/29/2019  Admitted From: Home  Discharge disposition: Home   Recommendations for Outpatient Follow-Up:    Follow up with your primary care provider in one week.    Discharge Diagnosis:   Active Problems:   Hypothyroidism   HYPERLIPIDEMIA   Breast cancer of upper-outer quadrant of left female breast (HCC)   Hypertension   Oropharyngeal dysphagia   Cancer of base of tongue (HCC)   Hyponatremia   Dehydration   Essential hypertension  Discharge Condition: Improved.  Diet recommendation:  Regular.  Wound care: None.  Code status: Full.   History of Present Illness:   Jessica Sherman is a 85 y.o. female with medical history significant of dysphagia, HTN,  anemia of chronic disease, remote breast cancer status post mastectomy presented to hospital with low sodium levels.  Patient stated several months of dysphagia with decreased p.o. intake. Patient was seen by her oncologist that noted that she has low sodium down to 120 which she felt secondary to dehydration. Plan regarding her base of the tongue malignancy was to undergo radiation therapy thinking that she may not be a good candidate for chemotherapy.  Hospital Course:  Following conditions were addressed during hospitalization,  Hyponatremia -was likely secondary to dehydration secondary to decreased p.o. intake.  Sodium level significantly improved with IV fluid hydration.  Urine osmolality of 453 with cortisol of 21.  TSH of 1.2.  Hypothyroidism - continue Synthroid.  TSH 1.2.  Abnormal UA no history of dysuria or abdominal discomfort or fevers.  No indication for antibiotic at this time.  Hyperlipidemia.   Continue simvastatin from home.  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  History of cancer of base of tongue.  Cancer of base of tongue [facility]) -patient is being seen by oncology, seen by oncology clinic recently.   Volume depletion-  -improved with IV fluids.  Patient did not have any orthostatic hypotension.  Disposition.  At this time patient is stable for disposition home.  She was advised to follow-up with her primary care physician as outpatient.  Will have to follow-up sodium levels as outpatient.    Medical Consultants:    None.   Subjective:   Today, patient feels good.  No dizziness lightheadedness.  She ambulated in the hallway.  Sodium levels have improved.  Discharge Exam:   Vitals:   03/29/19 0941 03/29/19 0942  BP: 130/72 127/72  Pulse: 77 86  Resp: 16 16  Temp: 97.9 F (36.6 C) 97.6 F (36.4 C)  SpO2: 99% 99%   Vitals:   03/29/19 0547 03/29/19 0938 03/29/19 0941 03/29/19 0942  BP: 127/61 124/67 130/72 127/72  Pulse: 63 65 77 86  Resp: 14 16 16 16   Temp: 97.9 F (36.6 C) (!) 97.5 F (36.4 C) 97.9 F (36.6 C) 97.6 F (36.4 C)  TempSrc: Oral Oral Oral Oral  SpO2: 99% 99% 99% 99%  Weight: 48.1 kg     Height: 5\' 3"  (1.6 m)       General exam: Appears calm and comfortable ,Not in distress, thinly built. HEENT:PERRL,Oral mucosa moist Respiratory system: Bilateral equal air entry, normal vesicular breath sounds, no wheezes or crackles  Cardiovascular system: S1 & S2 heard, RRR.  Gastrointestinal system: Abdomen  is nondistended, soft and nontender. No organomegaly or masses felt. Normal bowel sounds heard. Central nervous system: Alert and oriented. No focal neurological deficits. Extremities: No edema, no clubbing ,no cyanosis, distal peripheral pulses palpable. Skin: No rashes, lesions or ulcers,no icterus ,no pallor MSK: Normal muscle bulk,tone ,power    Procedures:    None  The results of significant diagnostics from this  hospitalization (including imaging, microbiology, ancillary and laboratory) are listed below for reference.     Diagnostic Studies:   Dg Chest Portable 1 View  Result Date: 03/28/2019 CLINICAL DATA:  Weakness and hyponatremia. History of throat carcinoma EXAM: PORTABLE CHEST 1 VIEW COMPARISON:  August 29, 2010 FINDINGS: Lungs are clear. Heart size and pulmonary vascularity are normal. No adenopathy. There are surgical clips in left axillary region. Bones are osteoporotic. IMPRESSION: No edema or consolidation.  No evident adenopathy. Electronically Signed   By: Bretta Bang III M.D.   On: 03/28/2019 13:41     Labs:   Basic Metabolic Panel: Recent Labs  Lab 03/28/19 1148 03/28/19 1417 03/28/19 2105 03/29/19 0439 03/29/19 0932  NA 120*  --  134* 132* 130*  K 3.6  --  3.9 4.4 4.3  CL 84*  --  100 96* 94*  CO2 30  --  26 27 26   GLUCOSE 140*  --  106* 101* 127*  BUN 18  --  15 13 13   CREATININE 0.75  --  0.55 0.65 0.67  CALCIUM 9.4  --  9.1 9.8 9.3  MG  --  2.2  --  2.2  --   PHOS  --   --   --  3.5  --    GFR Estimated Creatinine Clearance: 38.3 mL/min (by C-G formula based on SCr of 0.67 mg/dL). Liver Function Tests: Recent Labs  Lab 03/28/19 1148 03/29/19 0439  AST 15 18  ALT 13 15  ALKPHOS 69 65  BILITOT 0.4 0.8  PROT 7.3 7.6  ALBUMIN 4.1 3.6   No results for input(s): LIPASE, AMYLASE in the last 168 hours. No results for input(s): AMMONIA in the last 168 hours. Coagulation profile No results for input(s): INR, PROTIME in the last 168 hours.  CBC: Recent Labs  Lab 03/28/19 1148 03/29/19 0439  WBC 14.4* 11.8*  NEUTROABS 10.7*  --   HGB 11.8* 11.5*  HCT 36.1 34.8*  MCV 95.5 99.7  PLT 250 332   Cardiac Enzymes: No results for input(s): CKTOTAL, CKMB, CKMBINDEX, TROPONINI in the last 168 hours. BNP: Invalid input(s): POCBNP CBG: No results for input(s): GLUCAP in the last 168 hours. D-Dimer No results for input(s): DDIMER in the last 72 hours.  Hgb A1c No results for input(s): HGBA1C in the last 72 hours. Lipid Profile No results for input(s): CHOL, HDL, LDLCALC, TRIG, CHOLHDL, LDLDIRECT in the last 72 hours. Thyroid function studies Recent Labs    03/29/19 0439  TSH 1.281   Anemia work up No results for input(s): VITAMINB12, FOLATE, FERRITIN, TIBC, IRON, RETICCTPCT in the last 72 hours. Microbiology Recent Results (from the past 240 hour(s))  SARS Coronavirus 2 (Performed in [facility] hospital lab)     Status: None   Collection Time: 03/28/19  2:17 PM   Specimen: Nasopharyngeal Swab  Result Value Ref Range Status   SARS Coronavirus 2 NEGATIVE NEGATIVE Final    Comment: (NOTE) If result is NEGATIVE SARS-CoV-2 target nucleic acids are NOT DETECTED. The SARS-CoV-2 RNA is generally detectable in upper and lower  respiratory specimens during the acute  phase of infection. The lowest  concentration of SARS-CoV-2 viral copies this assay can detect is 250  copies / mL. A negative result does not preclude SARS-CoV-2 infection  and should not be used as the sole basis for treatment or other  patient management decisions.  A negative result may occur with  improper specimen collection / handling, submission of specimen other  than nasopharyngeal swab, presence of viral mutation(s) within the  areas targeted by this assay, and inadequate number of viral copies  (<250 copies / mL). A negative result must be combined with clinical  observations, patient history, and epidemiological information. If result is POSITIVE SARS-CoV-2 target nucleic acids are DETECTED. The SARS-CoV-2 RNA is generally detectable in upper and lower  respiratory specimens dur ing the acute phase of infection.  Positive  results are indicative of active infection with SARS-CoV-2.  Clinical  correlation with patient history and other diagnostic information is  necessary to determine patient infection status.  Positive results do  not rule out bacterial  infection or co-infection with other viruses. If result is PRESUMPTIVE POSTIVE SARS-CoV-2 nucleic acids MAY BE PRESENT.   A presumptive positive result was obtained on the submitted specimen  and confirmed on repeat testing.  While 2019 novel coronavirus  (SARS-CoV-2) nucleic acids may be present in the submitted sample  additional confirmatory testing may be necessary for epidemiological  and / or clinical management purposes  to differentiate between  SARS-CoV-2 and other Sarbecovirus currently known to infect humans.  If clinically indicated additional testing with an alternate test  methodology [phone]) is advised. The SARS-CoV-2 RNA is generally  detectable in upper and lower respiratory sp ecimens during the acute  phase of infection. The expected result is Negative. Fact Sheet for Patients:  BoilerBrush.com.cy Fact Sheet for Healthcare Providers: https://pope.com/ This test is not yet approved or cleared by the [place] FDA and has been authorized for detection and/or diagnosis of SARS-CoV-2 by FDA under an Emergency Use Authorization (EUA).  This EUA will remain in effect (meaning this test can be used) for the duration of the COVID-19 declaration under Section 564(b)(1) of the Act, 21 U.S.C. section 360bbb-3(b)(1), unless the authorization is terminated or revoked sooner. Performed at [facility], [address] [place], [place] [address]   SARS Coronavirus 2 (CEPHEID - Performed in [facility] hospital lab), Hosp Order     Status: None   Collection Time: 03/28/19 10:12 PM   Specimen: Nasopharyngeal Swab  Result Value Ref Range Status   SARS Coronavirus 2 NEGATIVE NEGATIVE Final    Comment: (NOTE) If result is NEGATIVE SARS-CoV-2 target nucleic acids are NOT DETECTED. The SARS-CoV-2 RNA is generally detectable in upper and lower  respiratory specimens during the acute phase of infection. The lowest   concentration of SARS-CoV-2 viral copies this assay can detect is 250  copies / mL. A negative result does not preclude SARS-CoV-2 infection  and should not be used as the sole basis for treatment or other  patient management decisions.  A negative result may occur with  improper specimen collection / handling, submission of specimen other  than nasopharyngeal swab, presence of viral mutation(s) within the  areas targeted by this assay, and inadequate number of viral copies  (<250 copies / mL). A negative result must be combined with clinical  observations, patient history, and epidemiological information. If result is POSITIVE SARS-CoV-2 target nucleic acids are DETECTED. The SARS-CoV-2 RNA is generally detectable in upper and lower  respiratory specimens dur ing the acute phase of infection.  Positive  results are indicative of active infection with SARS-CoV-2.  Clinical  correlation with patient history and other diagnostic information is  necessary to determine patient infection status.  Positive results do  not rule out bacterial infection or co-infection with other viruses. If result is PRESUMPTIVE POSTIVE SARS-CoV-2 nucleic acids MAY BE PRESENT.   A presumptive positive result was obtained on the submitted specimen  and confirmed on repeat testing.  While 2019 novel coronavirus  (SARS-CoV-2) nucleic acids may be present in the submitted sample  additional confirmatory testing may be necessary for epidemiological  and / or clinical management purposes  to differentiate between  SARS-CoV-2 and other Sarbecovirus currently known to infect humans.  If clinically indicated additional testing with an alternate test  methodology [phone]) is advised. The SARS-CoV-2 RNA is generally  detectable in upper and lower respiratory sp ecimens during the acute  phase of infection. The expected result is Negative. Fact Sheet for Patients:  BoilerBrush.com.cy Fact Sheet  for Healthcare Providers: https://pope.com/ This test is not yet approved or cleared by the [place] FDA and has been authorized for detection and/or diagnosis of SARS-CoV-2 by FDA under an Emergency Use Authorization (EUA).  This EUA will remain in effect (meaning this test can be used) for the duration of the COVID-19 declaration under Section 564(b)(1) of the Act, 21 U.S.C. section 360bbb-3(b)(1), unless the authorization is terminated or revoked sooner. Performed at [facility], [address], [place], [place] [address]      Discharge Instructions:   Discharge Instructions    Diet general   Complete by: As directed    Discharge instructions   Complete by: As directed    Increase fluid intake for the next couple of days.  Follow-up with your primary care provider within a week and get a blood work to check your sodium levels.   Increase activity slowly   Complete by: As directed      Allergies as of 03/29/2019      Reactions   Other Nausea And Vomiting   Anesthesia "makes me very sick", Please pre-medicate to control nausea and vomiting      Medication List    STOP taking these medications   diazepam 5 MG tablet Commonly known as: VALIUM     TAKE these medications   amLODipine 5 MG tablet Commonly known as: NORVASC Take 1 tablet (5 mg total) by mouth daily. What changed: when to take this   CALCIUM 600+D3 PO Take 1 tablet by mouth 2 (two) times daily.   levothyroxine 88 MCG tablet Commonly known as: SYNTHROID TAKE 1 TABLET BY MOUTH EVERY DAY EXCEPT ON WEDNESDAY TAKE 1/2 TABLET What changed: See the new instructions.   multivitamin with minerals Liqd Take 5 mLs by mouth daily.   simvastatin 20 MG tablet Commonly known as: ZOCOR Take 1 tablet (20 mg total) by mouth at bedtime.   sodium fluoride 1.1 % Crea dental cream Commonly known as: PreviDent 5000 Plus Place gel into fluoride tray at bedtime.  Place  over teeth for 5 minutes.  Remove.  Spit out excess.  Repeat nightly.   Vitamin D3 25 MCG (1000 UT) Caps Take 1,000 Units by mouth daily.         Time coordinating discharge: 39 minutes  Signed:  Alizzon Dioguardi  [facility] 03/29/2019, 11:14 AM

## 2019-03-29 NOTE — Progress Notes (Signed)
Pt stable at this time. Pt had no questions or concerns about d/c instructions and education given. rn also called pt family (angela) with d/c plan. Family had no questions at this time.

## 2019-03-29 NOTE — Plan of Care (Signed)
Pt to d/c home today with family. No need at this time. No changes needed to care plans and overall plan. Md to see pt and approved her for d/c. No pain or complications to report at this time.

## 2019-03-29 NOTE — Evaluation (Signed)
Physical Therapy Evaluation-1x Patient Details Name: Jessica Sherman MRN: [id number] DOB: [birth year] Today's Date: 03/29/2019   History of Present Illness  85 yo female admitted with hyponatremia. Hx of dysphagia, anemia, breat ca s/p mastectomy, tongue cancer, macular degeneration, vertigo  Clinical Impression  On eval, pt was Mod Ind with mobility. She walked ~200 feet around the unit and ascended/descended 3 steps with use of rails. She tolerated activity well. No PT needs. 1x eval. Will sign off.     Follow Up Recommendations      Equipment Recommendations  None recommended by PT    Recommendations for Other Services       Precautions / Restrictions Precautions Precautions: None Restrictions Weight Bearing Restrictions: No      Mobility  Bed Mobility Overal bed mobility: Modified Independent                Transfers Overall transfer level: Modified independent                  Ambulation/Gait Ambulation/Gait assistance: Modified independent (Device/Increase time) Gait Distance (Feet): 200 Feet Assistive device: None Gait Pattern/deviations: Step-through pattern        Stairs Stairs: Yes Stairs assistance: Modified independent (Device/Increase time) Stair Management: Forwards;Two rails Number of Stairs: 3    Wheelchair Mobility    Modified Rankin (Stroke Patients Only)       Balance Overall balance assessment: Mild deficits observed, not formally tested                                           Pertinent Vitals/Pain Pain Assessment: No/denies pain    Home Living Family/patient expects to be discharged to:: Private residence Living Arrangements: Children   Type of Home: House Home Access: Level entry     Home Layout: Multi-level Home Equipment: None      Prior Function Level of Independence: Independent               Hand Dominance        Extremity/Trunk Assessment   Upper Extremity  Assessment Upper Extremity Assessment: Overall WFL for tasks assessed    Lower Extremity Assessment Lower Extremity Assessment: Generalized weakness    Cervical / Trunk Assessment Cervical / Trunk Assessment: Normal  Communication   Communication: No difficulties  Cognition Arousal/Alertness: Awake/alert Behavior During Therapy: WFL for tasks assessed/performed Overall Cognitive Status: Within Functional Limits for tasks assessed                                        General Comments      Exercises     Assessment/Plan    PT Assessment Patent does not need any further PT services  PT Problem List         PT Treatment Interventions      PT Goals (Current goals can be found in the Care Plan section)  Acute Rehab PT Goals Patient Stated Goal: none stated PT Goal Formulation: All assessment and education complete, DC therapy    Frequency     Barriers to discharge        Co-evaluation               AM-PAC PT "6 Clicks" Mobility  Outcome Measure Help needed turning from your back to your side while  in a flat bed without using bedrails?: None Help needed moving from lying on your back to sitting on the side of a flat bed without using bedrails?: None Help needed moving to and from a bed to a chair (including a wheelchair)?: None Help needed standing up from a chair using your arms (e.g., wheelchair or bedside chair)?: None Help needed to walk in hospital room?: None Help needed climbing 3-5 steps with a railing? : None 6 Click Score: 24    End of Session   Activity Tolerance: Patient tolerated treatment well Patient left: in bed;with call bell/phone within reach;with bed alarm set        Time: 9381-0175 PT Time Calculation (min) (ACUTE ONLY): 13 min   Charges:   PT Evaluation $PT Eval Low Complexity: [place], PT Acute Rehabilitation Services Pager: [phone] Office: [phone]

## 2019-03-30 LAB — URINE CULTURE

## 2019-03-31 ENCOUNTER — Telehealth: Payer: Self-pay | Admitting: *Deleted

## 2019-03-31 NOTE — Telephone Encounter (Signed)
Tried calling pt/daughter to make hosp f/u appt. There was no answer LMOM RTC.Marland KitchenJohny Sherman

## 2019-04-01 ENCOUNTER — Ambulatory Visit
Admission: RE | Admit: 2019-04-01 | Discharge: 2019-04-01 | Disposition: A | Discharge status: Home / Self Care | Payer: PPO | Source: Ambulatory Visit | Attending: Radiation Oncology | Admitting: Radiation Oncology

## 2019-04-01 ENCOUNTER — Encounter: Payer: Self-pay | Admitting: Radiation Oncology

## 2019-04-01 ENCOUNTER — Other Ambulatory Visit: Payer: Self-pay

## 2019-04-01 ENCOUNTER — Encounter: Payer: Self-pay | Admitting: *Deleted

## 2019-04-01 ENCOUNTER — Telehealth: Payer: Self-pay | Admitting: *Deleted

## 2019-04-01 DIAGNOSIS — C01 Malignant neoplasm of base of tongue: Secondary | ICD-10-CM

## 2019-04-01 DIAGNOSIS — C028 Malignant neoplasm of overlapping sites of tongue: Secondary | ICD-10-CM | POA: Diagnosis not present

## 2019-04-01 DIAGNOSIS — C109 Malignant neoplasm of oropharynx, unspecified: Secondary | ICD-10-CM | POA: Diagnosis not present

## 2019-04-01 DIAGNOSIS — Z923 Personal history of irradiation: Secondary | ICD-10-CM | POA: Diagnosis not present

## 2019-04-01 DIAGNOSIS — Z853 Personal history of malignant neoplasm of breast: Secondary | ICD-10-CM | POA: Diagnosis not present

## 2019-04-01 NOTE — Telephone Encounter (Signed)
Oncology Nurse Navigator Documentation  Received by fax FMLA paperwork for patient's dtr, Antonieta Iba.   Delivered to Thompson Caul RN for processing.   Confirmed receipt via email to [place], informed 7 to 10 business days for processing.  Gayleen Orem, RN, BSN Head & Neck Oncology Nurse [place] at [place] [phone]

## 2019-04-01 NOTE — Telephone Encounter (Signed)
Daughter called back completed TCM call below.Jessica Sherman  Transition Care Management Follow-up Telephone Call   Date discharged? 03/29/19   How have you been since you were released from the hospital? Daughter states mom is feeling better. Mom was there with her and she states she feel fine, and she is glad to be home   Do you understand why you were in the hospital? YES   Do you understand the discharge instructions? YES   Where were you discharged to? Home   Items Reviewed:  Medications reviewed: YES, daughter states hospital D/C her Valium, but she is needing to take something for her anxiety. She states she has been only giving her half of he valium  Allergies reviewed: YES  Dietary changes reviewed: YES, daughter states she is making sure mom is eating and drinking regularly now since she is with her. She feels like that is the cause of her going to ED and ended up in the hospital due to her not eating and being dehydrated. She states they had an virtual appt today w/her oncologist and they are thinking about putting in a feeding tube  Referrals reviewed: No referral recommeded   Functional Questionnaire:   Activities of Daily Living (ADLs):   She states she are independent in the following: bathing and hygiene, feeding, continence, grooming and toileting States they require assistance with the following: ambulation and dressing   Any transportation issues/concerns?: NO   Any patient concerns? YES, daughter states the only concerning they really have is mom anxiety level. She need to be prescribe something to help   Confirmed importance and date/time of follow-up visits scheduled YES (virtual) 04/04/19  Provider Appointment booked with Dr. Quay Burow  Confirmed with patient if condition begins to worsen call PCP or go to the ER.  Patient was given the office number and encouraged to call back with question or concerns.  : YES

## 2019-04-01 NOTE — Progress Notes (Addendum)
Radiation Oncology         [phone] ________________________________  Initial Outpatient Consultation by WebEx due to pandemic precautions  Name: Jessica Sherman MRN: [id number]  Date: 04/01/2019  DOB: 1931/01/07  GG:YIRSW, Claudina Lick, MD  [name], MD   REFERRING PHYSICIAN: [name], MD  DIAGNOSIS:    ICD-10-CM   1. Malignant neoplasm of base of tongue ([place])  C01 Amb Referral to Nutrition and Diabetic E    Ambulatory referral to Social Work   Staging pending histology  CHIEF COMPLAINT: Here to discuss management of base of tongue cancer  HISTORY OF PRESENT ILLNESS::Jessica Sherman is a 85 y.o. female who presented with dysphagia, hoarseness, weight loss, and hemoptysis.  Subsequently, the patient saw PCP, Dr. Billey Gosling who ordered CT scan of the neck, performed 03/13/2019, which showed a large mass lesion in base of tongue on the right, compatible with carcinoma. There was also a 9 mm right level 2 lymph node which showed increased enhancement and was suspicious for metastatic disease.  Pertinent imaging thus far includes PET scan performed on 03/19/2019 revealing the large right tongue base mass markedly hypermetabolic with SUV max of 54.62. No neck adenopathy or evidence of metastatic disease in the chest, abdomen, or pelvis. A single 4 mm nodule in the right middle lobe is indeterminate. Surgical changes from left mastectomy. No chest wall mass or supraclavicular or axillary adenopathy.  The patient reviewed imaging results with Dr. Lindi Adie and was referred to Dr. Constance Holster for biopsy and surgical options; this consultation has not been scheduled yet. She did see Dr. Maylon Peppers on 03/28/2019 to discuss chemotherapy.  She was admitted due to electrolyte abnormalities and has since been discharged.  No feeding tube has been placed yet although her nutrition has been suboptimal.  I spoke with [facility] of the [facility] about whether she is a tors candidate and due to the size of  the tumor she has not.  She has been referred today for discussion of potential radiation treatment options.     Prior cancers, if any: History of left breast cancer, diagnosed in 2017. She was treated with neoadjuvant chemotherapy and underwent left modified radical mastectomy on 10/18/2016 which revealed complete pathologic response to treatment. She declined radiation therapy. She then received maintenance Herceptin Perjeta every 3 weeks, completed in August 2018.  She was born in [place], lives in [place]. She feels nervous today. She loves art, takes Programmer, multimedia. She loves pastels. She loves to get her hair done. She has 4 children.  She has no pain, she has trouble with poor taste, she has lost 10-11 lbs.  She can swallow soft foods. No hemoptysis. No SOB.    Wt Readings from Last 3 Encounters:  03/29/19 106 lb 1.6 oz (48.1 kg)  03/28/19 106 lb 12.8 oz (48.4 kg)  03/20/19 106 lb 1 oz (48.1 kg)     PREVIOUS RADIATION THERAPY: No  PAST MEDICAL HISTORY:  has a past medical history of colonic polyps, Hyperlipidemia, Hypothyroidism, Macular degeneration, dry, Osteopenia, PONV (postoperative nausea and vomiting), Prediabetes, and Vertigo.    PAST SURGICAL HISTORY: Past Surgical History:  Procedure Laterality Date   CATARACT EXTRACTION  2009   OS   COLONOSCOPY W/ POLYPECTOMY     X 2; last 2012. Dr Watt Climes   IR GENERIC HISTORICAL  05/09/2016   IR US GUIDE VASC ACCESS RIGHT 05/09/2016 Jacqulynn Cadet, MD [facility]   IR GENERIC HISTORICAL  05/09/2016   IR FLUORO GUIDE  CV LINE RIGHT 05/09/2016 Jacqulynn Cadet, MD [facility]   MODIFIED MASTECTOMY Left 10/18/2016   Procedure: LEFT MODIFIED RADICAL MASTECTOMY;  Surgeon: [name], MD;  Location: [facility];  Service: General;  Laterality: Left;   PILONIDAL CYST EXCISION     X 3   TONSILLECTOMY AND ADENOIDECTOMY     TOTAL ABDOMINAL HYSTERECTOMY     w/ bladder repair    FAMILY HISTORY: family history includes  Aneurysm in her father; Cancer in her maternal uncle; Leukemia in her mother; Osteoporosis in her mother.  SOCIAL HISTORY:  reports that she has never smoked. She has never used smokeless tobacco. She reports that she does not drink alcohol or use drugs.  ALLERGIES: Other  MEDICATIONS:  Current Outpatient Medications  Medication Sig Dispense Refill   amLODipine (NORVASC) 5 MG tablet Take 1 tablet (5 mg total) by mouth daily. (Patient taking differently: Take 5 mg by mouth at bedtime. ) 90 tablet 3   levothyroxine (SYNTHROID) 88 MCG tablet TAKE 1 TABLET BY MOUTH EVERY DAY EXCEPT ON WEDNESDAY TAKE 1/2 TABLET (Patient taking differently: Take 88 mcg by mouth See admin instructions. TAKE 1 TABLET BY MOUTH EVERY DAY EXCEPT ON WEDNESDAY TAKE 1/2 TABLET) 90 tablet 1   Multiple Vitamins-Minerals (PRESERVISION AREDS PO) Take by mouth.     simvastatin (ZOCOR) 20 MG tablet Take 1 tablet (20 mg total) by mouth at bedtime. 90 tablet 1   sodium fluoride (PREVIDENT 5000 PLUS) 1.1 % CREA dental cream Place gel into fluoride tray at bedtime.  Place over teeth for 5 minutes.  Remove.  Spit out excess.  Repeat nightly. 1 Tube prn   Calcium Carb-Cholecalciferol (CALCIUM 600+D3 PO) Take 1 tablet by mouth 2 (two) times daily.     Cholecalciferol (VITAMIN D3) 1000 UNITS CAPS Take 1,000 Units by mouth daily.      multivitamin with minerals (CERTA-VITE) LIQD Take 5 mLs by mouth daily. (Patient not taking: Reported on 04/01/2019) 200 mL 6   No current facility-administered medications for this encounter.     REVIEW OF SYSTEMS:  Notable for that above.   PHYSICAL EXAM:  vitals were not taken for this visit.   General: Alert and oriented, in no acute distress  She does have a bit of a hot potato voice  LABORATORY DATA:  Lab Results  Component Value Date   WBC 11.8 (H) 03/29/2019   HGB 11.5 (L) 03/29/2019   HCT 34.8 (L) 03/29/2019   MCV 99.7 03/29/2019   PLT 332 03/29/2019   CMP     Component Value  Date/Time   NA 130 (L) 03/29/2019 0932   NA 140 05/01/2017 1011   K 4.3 03/29/2019 0932   K 4.1 05/01/2017 1011   CL 94 (L) 03/29/2019 0932   CO2 26 03/29/2019 0932   CO2 30 (H) 05/01/2017 1011   GLUCOSE 127 (H) 03/29/2019 0932   GLUCOSE 116 05/01/2017 1011   BUN 13 03/29/2019 0932   BUN 22.1 05/01/2017 1011   CREATININE 0.67 03/29/2019 0932   CREATININE 0.75 03/28/2019 1148   CREATININE 0.9 05/01/2017 1011   CALCIUM 9.3 03/29/2019 0932   CALCIUM 10.0 05/01/2017 1011   PROT 7.6 03/29/2019 0439   PROT 7.0 05/01/2017 1011   ALBUMIN 3.6 03/29/2019 0439   ALBUMIN 3.6 05/01/2017 1011   AST 18 03/29/2019 0439   AST 15 03/28/2019 1148   AST 20 05/01/2017 1011   ALT 15 03/29/2019 0439   ALT 13 03/28/2019 1148   ALT  16 05/01/2017 1011   ALKPHOS 65 03/29/2019 0439   ALKPHOS 72 05/01/2017 1011   BILITOT 0.8 03/29/2019 0439   BILITOT 0.4 03/28/2019 1148   BILITOT 0.37 05/01/2017 1011   GFRNONAA >60 03/29/2019 0932   GFRNONAA >60 03/28/2019 1148   GFRAA >60 03/29/2019 0932   GFRAA >60 03/28/2019 1148      Lab Results  Component Value Date   TSH 1.281 03/29/2019     RADIOGRAPHY: Ct Soft Tissue Neck W Contrast  Result Date: 03/13/2019 CLINICAL DATA:  Dysphagia. Hoarseness. Weight loss. Hemoptysis. History of breast cancer EXAM: CT NECK WITH CONTRAST TECHNIQUE: Multidetector CT imaging of the neck was performed using the standard protocol following the bolus administration of intravenous contrast. CONTRAST:  43mL OMNIPAQUE IOHEXOL 300 MG/ML  SOLN COMPARISON:  None. FINDINGS: Pharynx and larynx: Large enhancing mass lesion in the base of tongue on the right. This shows homogeneous enhancement and measures approximately 3.1 x 3.1 x 3.8 cm. Airway intact.  Epiglottis and larynx normal. Salivary glands: No inflammation, mass, or stone. Thyroid: Small thyroid without focal abnormality. Lymph nodes: 9 mm enhancing right level 2 lymph node, suspicious for metastatic disease. Vascular: Normal  vascular enhancement. Limited intracranial: Negative Visualized orbits: Negative Mastoids and visualized paranasal sinuses: Negative Skeleton: Cervical spondylosis.  No acute skeletal abnormality. Upper chest: Apically scarring bilaterally. No acute infiltrate or mass in the lung apices. Other: None IMPRESSION: Large mass lesion base of tongue on the right compatible with carcinoma. 9 mm right level 2 lymph node which shows increased enhancement and is suspicious for metastatic disease. Correlate with PET-CT. Electronically Signed   By: Franchot Gallo M.D.   On: 03/13/2019 14:59   Nm Pet Image Initial (pi) Skull Base To Thigh  Result Date: 03/19/2019 CLINICAL DATA:  Initial treatment strategy for tongue base neoplasm. EXAM: NUCLEAR MEDICINE PET SKULL BASE TO THIGH TECHNIQUE: 5.68 mCi F-18 FDG was injected intravenously. Full-ring PET imaging was performed from the skull base to thigh after the radiotracer. CT data was obtained and used for attenuation correction and anatomic localization. Fasting blood glucose: 120 mg/dl COMPARISON:  Neck CT 03/13/2019 FINDINGS: Mediastinal blood pool activity: SUV max 2.24 Liver activity: SUV max NA NECK: Large right-sided tongue base mass extending into the right tonsillar and vallecular regions. This is markedly hypermetabolic with SUV max of 01.77. No enlarged or hypermetabolic neck or supraclavicular lymph nodes. There is fairly extensive hypermetabolic brown fat noted mainly in the supraclavicular areas. Incidental CT findings: none CHEST: Emphysematous changes and pulmonary scarring but no acute pulmonary findings. 4 mm sub solid nodule in the right middle lobe is indeterminate. It was not present on the prior chest CT from 2017. It could be inflammatory. Follow-up noncontrast chest CT in 6-12 months is suggested. Small nodule along the left major fissure was present on 2017 and is likely a lymph node. No supraclavicular or axillary hypermetabolic adenopathy. No  mediastinal or hilar adenopathy. Incidental CT findings: Surgical changes from a left mastectomy. ABDOMEN/PELVIS: No abnormal hypermetabolic activity within the liver, pancreas, adrenal glands, or spleen. No hypermetabolic lymph nodes in the abdomen or pelvis. Incidental CT findings: none SKELETON: No findings for metastatic bone disease. Areas of hypermetabolism in the intercostal muscles in the chest are noted. This is a benign finding. Incidental CT findings: none IMPRESSION: 1. Large right tongue base mass is markedly hypermetabolic and consistent with neoplasm. 2. No neck adenopathy or evidence of metastatic disease in the chest, abdomen or pelvis. 3. Single 4 mm nodule  in the right middle lobe is indeterminate. Recommend follow-up noncontrast chest CT in 6-12 months. 4. Surgical changes from a left mastectomy. No chest wall mass or supraclavicular or axillary adenopathy. Electronically Signed   By: Marijo Sanes M.D.   On: 03/19/2019 12:37   Dg Chest Portable 1 View  Result Date: 03/28/2019 CLINICAL DATA:  Weakness and hyponatremia. History of throat carcinoma EXAM: PORTABLE CHEST 1 VIEW COMPARISON:  August 29, 2010 FINDINGS: Lungs are clear. Heart size and pulmonary vascularity are normal. No adenopathy. There are surgical clips in left axillary region. Bones are osteoporotic. IMPRESSION: No edema or consolidation.  No evident adenopathy. Electronically Signed   By: Lowella Grip III M.D.   On: 03/28/2019 13:41      IMPRESSION/PLAN:  This is a delightful patient with head and neck cancer. I will likely recommend radiotherapy for this patient.  However the histology of her mass has not yet been determined.  Based on my discussion with medical oncology she is not an optimal candidate for chemotherapy but this may need to be considered strongly if her mass is a lymphoma.  If this is a squamous cell carcinoma we could potentially cure her with radiation alone  Biopsy is pending with Dr.  Constance Holster  We discussed the potential risks, benefits, and side effects of radiotherapy. We talked in detail about acute and late effects. We discussed that some of the most bothersome acute effects may be mucositis, dysgeusia, salivary changes, skin irritation, hair loss, dehydration, weight loss and fatigue. We talked about late effects which include but are not necessarily limited to dysphagia, hypothyroidism, nerve injury, spinal cord injury, xerostomia, trismus, and neck edema. No guarantees of treatment were given.  The patient is enthusiastic about proceeding with treatment. I look forward to participating in the patient's care.    Simulation (treatment planning) will take place once histology is determined and plan is finalized  We also discussed that the treatment of head and neck cancer is a multidisciplinary process to maximize treatment outcomes and quality of life. For this reason the following referrals have been or will be made:   Medical oncology to discuss chemotherapy    Dentistry for dental evaluation, possible extractions in the radiation fields, and /or advice on reducing risk of cavities, osteoradionecrosis, or other oral issues.  I will do my best to stay off of her tooth roots and I do not think extractions will be necessary.   Nutritionist for nutrition support during and after treatment.   Social work for social support.  She repeatedly mentions that she feels distressed and anxious.  I gave her emotional support today.  We discussed measures to reduce the risk of infection during the COVID-19 pandemic.  She continues to go to church and socialize with friends.  She reports that she takes precautions.  We talked about ways to mitigate risk and I suggested that she reconsider some of her activities so that she gets through her oncologic treatments smoothly.  She will discuss this further with her daughter.  The social activities are highly valued by the patient and important for  her emotional wellbeing, however she is taking on some extra exposures that heighten her risk of catching the coronavirus.   This encounter was provided by telemedicine platform Webex.  Patient's daughter and Gayleen Orem, RN, our Head and Neck Oncology Navigator also participated The patient has given verbal consent for this type of encounter and has been advised to only accept a meeting of this  type in a secure network environment. The time spent during this encounter was 45 minutes. The attendants for this meeting include [name]  and [name].  During the encounter, [name] was located at [facility] Radiation Oncology Department.  [name] was located at home.   __________________________________________   [name], MD  This document serves as a record of services personally performed by [name], MD. It was created on her behalf by Rae Lips, a trained medical scribe. The creation of this record is based on the scribe's personal observations and the provider's statements to them. This document has been checked and approved by the attending provider.

## 2019-04-01 NOTE — Progress Notes (Signed)
Oncology Nurse Navigator Documentation  Met with Ms. Jessica Sherman during initial consult with Dr. Isidore Moos via WebEx.  She was accompanied by dtr [name].  . Further introduced myself as her Navigator, again provided my contact information.   . Provided introductory explanation of radiation treatment including SIM planning and purpose of Aquaplast head and shoulder mask, showed them example.  She sated preference for the open-face mask d/t pronounced claustrophobia.  Dr. Isidore Moos made aware. . Discussed value of PEG, showed example, explained use.  She voiced understanding I will arrange in-person PEG education prior to placement.  I agreed to provide WebEx education to dtr prior to PEG placement.  . She reported appt with ENT [name] this afternoon 1310.  She voiced understanding he will conduct bx either in office or schedule at [facility]. . She voiced understanding finalization of tmt plan to be based on bx results:  7 wks RT if SCC, chemotherapy possibly followed by 3-4 wks RT if lymphoma.  . She expressed high anxiety about diagnosis and treatment.  I encouraged her to call me with questions/concerns as she moves forward with additional appointments. Patient and dtr verbalized understanding of information provided.    Gayleen Orem, RN, BSN Head & Neck Oncology Nurse [place] at [place] [phone]

## 2019-04-02 ENCOUNTER — Telehealth: Payer: Self-pay | Admitting: *Deleted

## 2019-04-02 ENCOUNTER — Other Ambulatory Visit: Payer: Self-pay | Admitting: Radiation Oncology

## 2019-04-02 ENCOUNTER — Encounter: Payer: Self-pay | Admitting: Radiation Oncology

## 2019-04-02 DIAGNOSIS — C01 Malignant neoplasm of base of tongue: Secondary | ICD-10-CM

## 2019-04-02 NOTE — Telephone Encounter (Signed)
Oncology Nurse Navigator Documentation  Spoke with Ms. Fasig in follow-up to yesterday's WebEx consult with Dr. Isidore Moos.  After further discussion re the value of having a PEG, she agreed to move forward with placement.  I indicated I will contact her to arrange in-person PEG education prior to placement.   I answered other RT-related questions.  She reported bx conducted yesterday during consult with ENT Constance Holster, she is feeling no effects, results pending later this week/early next week.  She reported ongoing anxiety re treatment, I encouraged her to call me at any time with questions/concerns, assured her of my availability to provide support.  Dr. Isidore Moos notified of PEG decision.  Gayleen Orem, RN, BSN Head & Neck Oncology Nurse [place] at [place] [phone]

## 2019-04-02 NOTE — Progress Notes (Signed)
Dental Form with Estimates of Radiation Dose      Diagnosis: base of tongue mass  Prognosis: TBD - fair to good  Anticipated # of fractions: TBD    Daily?: yes  # of weeks of radiotherapy: TBD  Chemotherapy?: TBD  Anticipated xerostomia:  Mild permanent vsTransient  Pre-simulation needs: dental hygiene advice, cleanings if possible, SPDs ASAP  Simulation: ASAP after biopsy, depending on results    Please contact [name], MD, with patient's disposition after evaluation and/or dental treatment.

## 2019-04-03 ENCOUNTER — Telehealth: Payer: Self-pay | Admitting: *Deleted

## 2019-04-03 NOTE — Progress Notes (Signed)
Virtual Visit via Video Note  I connected with Jessica Sherman on 04/04/19 at 10:00 AM EDT by a video enabled telemedicine application and verified that I am speaking with the correct person using two identifiers.   I discussed the limitations of evaluation and management by telemedicine and the availability of in person appointments. The patient expressed understanding and agreed to proceed.  The patient is currently at home and I am in the office.  Her daughter is with her.   No referring provider.    History of Present Illness: This visit is for hospital follow-up.   Admitted 03/28/2019-05/30/2019.  Admitted for hyponatremia.  She had seen Dr. Maylon Peppers for her newly diagnosed tongue cancer.  She had blood work done over concerns for possible dehydration and was found to have a sodium of 120.  She was advised to go to the emergency room.  She did not have any chills, fevers, chest pain, lightheadedness.  Rapid COVID test was negative.  ED course noted unremarkable chest x-ray.  Repeat sodium was 120.  Hyponatremia: Likely secondary dehydration from decreased p.o. intake.  Received IV fluids and sodium level significantly improved.  Hypothyroidism: TSH in normal range.  Continued on home dose of Synthroid.  Abnormal urinalysis: No dysuria, abdominal discomfort or fevers.  Antibiotics not indicated or given.  Hyperlipidemia: Continue simvastatin.  History of breast cancer left upper outer quadrant: Following with oncology.  In remission.  Essential hypertension: Continued on home medications.    Volume depletion: Improved with IV fluids.  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  She has seen oncology, dentistry, radiation oncology and ENT.  She had the biopsy 04/01/19.   Overall she is doing well.  She has been busy with appointments.  She has had increased anxiety with these procedures.  She has to get a PEG tube and is very anxious about it.  She has  been eating and drinking well.  She has been drinking a lot of water.  She has not had any difficulty eating -  She is eating soft foods.  Inc anxiety: she has increased anxiety in general, but it is more focused on the procedures she has to have done - the PEG tube and possible radiation.  For her breast cancer she did take a BDZ as needed.    Review of Systems  Constitutional: Negative for chills, fever and malaise/fatigue.  Respiratory: Negative for shortness of breath.   Cardiovascular: Negative for chest pain.  Neurological: Negative for dizziness and headaches.  Psychiatric/Behavioral: Negative for depression. The patient is nervous/anxious. The patient does not have insomnia.      Social History   Socioeconomic History  . Marital status: Widowed    Spouse name: Not on file  . Number of children: 4  . Years of education: Not on file  . Highest education level: Not on file  Occupational History  . Occupation: retired  Scientific laboratory technician  . Financial resource strain: Not on file  . Food insecurity    Worry: Not on file    Inability: Not on file  . Transportation needs    Medical: No    Non-medical: No  Tobacco Use  . Smoking status: Never Smoker  . Smokeless tobacco: Never Used  Substance and Sexual Activity  . Alcohol use: No    Comment:    . Drug use: No  . Sexual activity: Not on file  Lifestyle  . Physical activity    Days  per week: Not on file    Minutes per session: Not on file  . Stress: Not on file  Relationships  . Social Herbalist on phone: Not on file    Gets together: Not on file    Attends religious service: Not on file    Active member of club or organization: Not on file    Attends meetings of clubs or organizations: Not on file    Relationship status: Not on file  Other Topics Concern  . Not on file  Social History Narrative   Regular exercise- yes 33mpd w/o symptoms     Observations/Objective: Appears well in NAD  Lab Results   Component Value Date   WBC 11.8 (H) 03/29/2019   HGB 11.5 (L) 03/29/2019   HCT 34.8 (L) 03/29/2019   PLT 332 03/29/2019   GLUCOSE 127 (H) 03/29/2019   CHOL 135 01/27/2019   TRIG 204.0 (H) 01/27/2019   HDL 51.00 01/27/2019   LDLDIRECT 57.0 01/27/2019   LDLCALC 72 01/04/2018   ALT 15 03/29/2019   AST 18 03/29/2019   NA 130 (L) 03/29/2019   K 4.3 03/29/2019   CL 94 (L) 03/29/2019   CREATININE 0.67 03/29/2019   BUN 13 03/29/2019   CO2 26 03/29/2019   TSH 1.281 03/29/2019   INR 0.98 05/09/2016   HGBA1C 6.2 01/27/2019    DG Chest Portable 1 View CLINICAL DATA:  Weakness and hyponatremia. History of throat carcinoma  EXAM: PORTABLE CHEST 1 VIEW  COMPARISON:  August 29, 2010  FINDINGS: Lungs are clear. Heart size and pulmonary vascularity are normal. No adenopathy. There are surgical clips in left axillary region. Bones are osteoporotic.  IMPRESSION: No edema or consolidation.  No evident adenopathy.  Electronically Signed   By: Lowella Grip III M.D.   On: 03/28/2019 13:41   Assessment and Plan:  See Problem List for Assessment and Plan of chronic medical problems.   Follow Up Instructions:    I discussed the assessment and treatment plan with the patient. The patient was provided an opportunity to ask questions and all were answered. The patient agreed with the plan and demonstrated an understanding of the instructions.   The patient was advised to call back or seek an in-person evaluation if the symptoms worsen or if the condition fails to improve as anticipated.    [name], MD

## 2019-04-03 NOTE — Telephone Encounter (Signed)
CALLED RADIOLOGY ABOUT SETTING UP A PEG TUBE FOR THIS PATIENT, SPOKE WITH SCHEDULER AND SHE STATES THAT SHE DOESN'T HAVE ANYTHING UNITL AUGUST 6, NOTIFIED [place]

## 2019-04-04 ENCOUNTER — Ambulatory Visit (INDEPENDENT_AMBULATORY_CARE_PROVIDER_SITE_OTHER): Payer: PPO | Admitting: Internal Medicine

## 2019-04-04 ENCOUNTER — Encounter: Payer: Self-pay | Admitting: Internal Medicine

## 2019-04-04 DIAGNOSIS — I10 Essential (primary) hypertension: Secondary | ICD-10-CM | POA: Diagnosis not present

## 2019-04-04 DIAGNOSIS — E860 Dehydration: Secondary | ICD-10-CM

## 2019-04-04 DIAGNOSIS — F419 Anxiety disorder, unspecified: Secondary | ICD-10-CM | POA: Diagnosis not present

## 2019-04-04 DIAGNOSIS — E039 Hypothyroidism, unspecified: Secondary | ICD-10-CM

## 2019-04-04 DIAGNOSIS — C01 Malignant neoplasm of base of tongue: Secondary | ICD-10-CM | POA: Diagnosis not present

## 2019-04-04 DIAGNOSIS — E871 Hypo-osmolality and hyponatremia: Secondary | ICD-10-CM

## 2019-04-04 DIAGNOSIS — R4702 Dysphasia: Secondary | ICD-10-CM | POA: Diagnosis not present

## 2019-04-04 MED ORDER — LORAZEPAM 0.5 MG PO TABS: 0.2500 mg | ORAL_TABLET | Freq: Two times a day (BID) | ORAL | 0 refills | Status: DC | PRN

## 2019-04-04 NOTE — Assessment & Plan Note (Signed)
Eating primarily soft foods without difficulty swallowing Continue

## 2019-04-04 NOTE — Assessment & Plan Note (Signed)
Having increased anxiety in general, but mostly situational-she does get anxious for procedures Discussed options and we will start Ativan 0.25 mg as needed-her daughter will give this to her-she did take this for her breast cancer and did fine with it If this does not seem to be working well he will let me know and we can try something different such as low-dose Valium

## 2019-04-04 NOTE — Assessment & Plan Note (Signed)
Continue current medications. 

## 2019-04-04 NOTE — Assessment & Plan Note (Signed)
Continue current dose of levothyroxine.

## 2019-04-04 NOTE — Assessment & Plan Note (Signed)
Since coming home from the hospital she has been drinking plenty of fluids including water

## 2019-04-04 NOTE — Assessment & Plan Note (Signed)
Had a biopsy done this week-waiting for results Treatment will be surgery, chemotherapy or radiation depending on results of biopsy To have PEG tube placed

## 2019-04-04 NOTE — Assessment & Plan Note (Signed)
Sodium improved in the hospital with IV fluids She has been drinking plenty of fluids since being home and is low risk for dehydration Blood work can be rechecked oncology at the next visit since this is a virtual visit Encouraged her to continue increase fluids

## 2019-04-07 ENCOUNTER — Telehealth: Payer: Self-pay | Admitting: *Deleted

## 2019-04-07 NOTE — Telephone Encounter (Signed)
Oncology Nurse Navigator Documentation  Called Jessica Sherman to inform her of CT [facility] tomorrow at 3:00, preceded by 2:00 arrival for 2:15 IV Start.  She voiced understanding. She confirmed call from Dr. Constance Holster with results of biopsy.  I explained benefit of p16 positivity in context of tmt and prognosis. Informed her Dr. Isidore Moos plans to start RT next Monday, 7 weeks tmt. She confirmed understanding of 8/6 PEG placement.  Gayleen Orem, RN, BSN Head & Neck Oncology Nurse [place] at [place] [phone]

## 2019-04-07 NOTE — Progress Notes (Signed)
Has armband been applied?  Yes  Does patient have an allergy to IV contrast dye?: No   Has patient ever received premedication for IV contrast dye?: N/A  Does patient take metformin?: No  If patient does take metformin when was the last dose: N/A  Date of lab work: 03/29/19 BUN: 13 CR: 0.67 EGFR: >60  IV site: Right Forearm  Has IV site been added to flowsheet?  Yes

## 2019-04-08 ENCOUNTER — Inpatient Hospital Stay: Payer: PPO

## 2019-04-08 ENCOUNTER — Encounter: Payer: Self-pay | Admitting: *Deleted

## 2019-04-08 ENCOUNTER — Ambulatory Visit
Admission: RE | Admit: 2019-04-08 | Discharge: 2019-04-08 | Disposition: A | Discharge status: Home / Self Care | Payer: PPO | Source: Ambulatory Visit | Attending: Radiation Oncology | Admitting: Radiation Oncology

## 2019-04-08 ENCOUNTER — Telehealth: Payer: Self-pay | Admitting: *Deleted

## 2019-04-08 ENCOUNTER — Ambulatory Visit (HOSPITAL_COMMUNITY): Payer: Self-pay | Admitting: Dentistry

## 2019-04-08 ENCOUNTER — Encounter (HOSPITAL_COMMUNITY): Payer: Self-pay | Admitting: Dentistry

## 2019-04-08 ENCOUNTER — Other Ambulatory Visit: Payer: Self-pay

## 2019-04-08 VITALS — BP 142/55 | HR 66 | Temp 98.9°F

## 2019-04-08 DIAGNOSIS — C01 Malignant neoplasm of base of tongue: Secondary | ICD-10-CM

## 2019-04-08 DIAGNOSIS — Z510 Encounter for antineoplastic radiation therapy: Secondary | ICD-10-CM | POA: Diagnosis not present

## 2019-04-08 DIAGNOSIS — Z463 Encounter for fitting and adjustment of dental prosthetic device: Secondary | ICD-10-CM

## 2019-04-08 DIAGNOSIS — Z01818 Encounter for other preprocedural examination: Secondary | ICD-10-CM

## 2019-04-08 MED ORDER — SODIUM CHLORIDE 0.9% FLUSH
10.0000 mL | Freq: Once | INTRAVENOUS | Status: AC
  Administered 2019-04-08: 10 mL via INTRAVENOUS

## 2019-04-08 NOTE — Telephone Encounter (Signed)
Oncology Nurse Navigator Documentation  Delivered copy of completed FMLA to dtr when she picked up mother s/p CT SIM, explained copy to be faxed to [employer].  Gayleen Orem, RN, BSN Head & Neck Oncology Nurse [place] at [place] [phone]

## 2019-04-08 NOTE — Progress Notes (Signed)
Head and Neck Cancer Simulation, IMRT treatment planning  note   Outpatient  Diagnosis:    ICD-10-CM   1. Cancer of base of tongue ([place])  C01     The patient was taken to the CT simulator and identity was confirmed.  All relevant records and images related to the planned course of therapy were reviewed.  The patient freely provided informed written consent to proceed with treatment after reviewing the details related to the planned course of therapy. The consent form was witnessed and verified by the simulation staff.  I talked to the patient and her daughter, her daughter was present on speaker phone while the patient was seen face-to-face.  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  On exam the patient does not have any obvious lesions in her upper throat or her mouth.  The patient was laid in the supine position on the table. An Aquaplast head and shoulder mask was custom fitted to the patient's anatomy. High-resolution CT axial imaging was obtained of the head and neck with contrast. I verified that the quality of the imaging is good for treatment planning. 1 Medically Necessary Treatment Device was fabricated and supervised by me: Aquaplast mask.  Treatment planning note I plan to treat the patient with IMRT. I plan to treat the patient's tumor and bilateral neck nodes. I plan to treat to a total dose of 70 [place] in 35  fractions. Dose calculation was ordered from dosimetry.  IMRT planning Note  IMRT is medically necessary and an important modality to deliver adequate dose to the patient's at risk tissues while sparing the patient's normal structures, including the: esophagus, parotid tissue, mandible, brain stem, spinal cord, oral cavity, brachial plexus.  This justifies the use of IMRT in the patient's treatment.    -----------------------------------  [name], MD

## 2019-04-08 NOTE — Progress Notes (Signed)
04/08/2019  Patient Name:   KYSA CALAIS Date of Birth:   November 20, 1930 Medical Record Number: [id number]  BP (!) 142/55 (BP Location: Right Arm)   Pulse 66   Temp 98.9 F (37.2 C)   Kemara E Degner now presents for insertion of upper and lower fluoride trays and scatter protection devices.  PROCEDURE: Appliances were tried in and adjusted as needed. [place] Trismus device was fabricated 40 mm using 22 sticks. Postop instructions were provided and a written and verbal format concerning the use and care of appliances. All questions were answered. Patient to call for appointment for oral examination during radiation therapy in approximately 2-3 weeks if so desired.  Otherwise patient will be seen 1 month after she has completed her radiation therapy.  Patient to call if questions or problems arise before then.   Lenn Cal, DDS

## 2019-04-08 NOTE — Progress Notes (Signed)
Nutrition Assessment   Reason for Assessment:   New base of tongue cancer, planning PEG placement on 8/6   ASSESSMENT:   85 year old female with base of the tongue cancer.  Planning radiation with Dr. Isidore Moos.  Past medical history of left breast cancer in 2017 s/p modified radical mastectomy on 10/18/2016, HLD, prediabetes, PONV, hypothyroidism.  Patient also followed by Dr. Maylon Peppers. Noted not an optimal candidate for chemotherapy. Patient with recent hospital admission for hyponatremia  Spoke with patient via phone this am for nutrition assessment.  Patient stated that she is overwhelmed with appointments and information.  Concerned about upcoming PEG placement.  Reports that she has a "really good appetite."  " I just don't eat much at one time."  Reports that typically for breakfast she has oatmeal or egg or cereal.  Lunch is tomato sandwich or soup and dinner last night was pasta with meat sauce.  Reports that she prepares meals.  Drinks 1-sometimes 2 or 3 ensure plus/boost daily.  Reports that she is drinking "a lot of water" and gatorade.    Reports normal bowel movement.   Nutrition Focused Physical Exam: deferred   Medications: calcium, vit D, MVI   Labs: from 7/20, Na 130, glucose 127, BUN 13, creatinine 0.67   Anthropometrics:   Height: 63 inches Weight: 106 lb 1.6 oz 7/18 UBW: patient reports 118 lb unsure time frame.  Noted per chart weight of 115 lb on 10/31/2018 BMI: 18  7% weight loss in 5 months   Estimated Energy Needs  Kcals: 1440-1680 calories Protein: 72-84 g  Fluid: 1.6 L   NUTRITION DIAGNOSIS: Predicted suboptimal energy intake related to base of tongue cancer as evidenced by side effects from treatment, BMI 18, 7% weight loss prior to treatment.    INTERVENTION:  Discussed ways to increase calories and protein in daily intake. Discussed soft moist protein foods and encouraged good sources of protein at every meal.  Encouraged patient to continue  drinking ensure plus/boost plus 1-3 times daily for additional calories. Talked briefly about upcoming PEG tube and RD's role.     MONITORING, EVALUATION, GOAL: Patient will maintain nutrition utilizing PEG tube.    Next Visit: phone visit on Friday, 8/7 with Ernestene Kiel following PEG tube placement  Marqual Mi B. Zenia Resides, [place], [place] Registered Dietitian [phone] (pager)

## 2019-04-08 NOTE — Progress Notes (Signed)
Oncology Nurse Navigator Documentation  Met Ms. Megan Salon s/p Dental Medicine appt for fitting of trays/SPD, escorted her to [place] for IV Start and CT SIM. She completed SIM without issue. Toured her to [facility], explained registration, arrival and preparation procedures. . Provided New Patient Information packet, discussed contents: o Contact information for physician(s), myself, other members of the Care Team. o Advance Directive information ([place] blue pamphlet with LCSW contact info); provided [facility] AD booklet at his request, encouraged him to contact Gwinda Maine LCSW to complete. o Fall Prevention Patient [place] campus map with highlight of [place] o SLP, Nutrition and PT handouts. o Symptom Management Clinic information . Provided 2 copies of Epic appt calendar. . Escorted Ms. Riesen to [place] entrance where she was picked up by her dtr.  Provided dtr copy of completed FMLA forms, explained copy being faxed to provider. . I encouraged them to contact me with questions/concerns as treatments/procedures begin.   Gayleen Orem, RN, BSN Head & Neck Oncology Nurse [place] at [place] [phone]

## 2019-04-08 NOTE — Patient Instructions (Signed)
COVID-19 Education: The signs and symptoms of COVID-19 were discussed with the patient and how to seek care for testing (follow up with PCP or arrange E-visit).   The importance of social distancing was discussed today.  FLUORIDE TRAYS PATIENT INSTRUCTIONS    Obtain Prevident 5000 prescription from the pharmacy.  Don't be surprised if it needs to be ordered.  Be sure to let the pharmacy know when you are close to needing a new refill for them to have it ready for you without interruption of Fluoride use.  The best time to use your Fluoride is before bedtime.  You must brush your teeth very well and floss before using the Fluoride in order to get the best use out of the Fluoride treatments.  Place Fluoride gel in the tray and spread gel around in the tray with your finger or cotton tip applicator.  Place the tray on your lower teeth and your upper teeth.  Make sure the trays are seated all the way.  Remember, they only fit one way on your teeth.  Insert for 5 full minutes.  At the end of the 5 minutes, take the trays out.  SPIT OUT excess.   Do NOT rinse your mouth!  Do NOT eat or drink after treatments for at least 30 minutes.  This is why the best time for your treatments is before bedtime.  Clean the inside of your Fluoride trays using COLD WATER and a toothbrush.  In order to keep your Trays from discoloring and free from odors, soak them overnight in denture cleaners such as Efferdent.  Do not use bleach or non denture products.  Store the trays in a safe dry place AWAY from any heat until your next treatment.  If anything happens to your Fluoride trays, or they don't fit as well after any dental work, please let us know as soon as possible.  

## 2019-04-08 NOTE — Telephone Encounter (Signed)
Oncology Nurse Navigator Documentation  LVMM/sent e-mail to Bethanne Ginger, [place] [facility]), requested delivery of Start of Care Bolus Feeding Kit to [facility] by Friday in support of patient's 8/6 PEG placement by [facility] IR.  E-mail acknowledgment requested.  Gayleen Orem, RN, BSN Head & Neck Oncology Nurse [place] at [place] [phone]

## 2019-04-11 ENCOUNTER — Encounter: Payer: Self-pay | Admitting: *Deleted

## 2019-04-11 ENCOUNTER — Telehealth: Payer: Self-pay | Admitting: *Deleted

## 2019-04-11 DIAGNOSIS — Z510 Encounter for antineoplastic radiation therapy: Secondary | ICD-10-CM | POA: Diagnosis not present

## 2019-04-11 DIAGNOSIS — C01 Malignant neoplasm of base of tongue: Secondary | ICD-10-CM | POA: Diagnosis not present

## 2019-04-11 NOTE — Progress Notes (Signed)
Oncology Nurse Navigator Documentation  Met with Jessica Sherman and her dtr to provide PEG education prior to next Thursday's placement.  . Using  PEG teaching device   and Teach Back, provided education for PEG use and care, including: hand hygiene, gravity bolus administration of daily water flushes and nutritional supplement, fluids and medications; care of tube insertion site including daily dressing change and cleaning; S&S of infection.   . Jessica Sherman correctly verbalized and provided correct return demonstration of gravity administration of water, dressing change and cleaning procedures.   Her dtr verbalized information, voiced experience with PEGs. . I provided written instructions for PEG flushing/dressing change in support of verbal instruction.   Hanley Seamen her Start of Care Bolus Feeding Kit provided by [place] (formerly [place]).  Provided pack of mesh briefs as alternative to tape for securing PEG tube when not in use. . She voiced understanding I will be available for ongoing PEG support.  Reviewed guidance for barium sulfate prep picked up earlier this week.  I encouraged them to call me with questions/concerns.  The voiced understanding.  Gayleen Orem, RN, BSN Head & Neck Oncology Nurse [place] at [place] [phone]

## 2019-04-11 NOTE — Telephone Encounter (Signed)
Oncology Nurse Navigator Documentation  Spoke with Jessica Sherman, arranged for her and her dtr to meet me at 2:00 [facility] front entrance waiting area for PEG education.  Gayleen Orem, RN, BSN Head & Neck Oncology Nurse [place] at [place] [phone]

## 2019-04-11 NOTE — Telephone Encounter (Signed)
[place] Work  Clinical Social Work was referred by Pension scheme manager for assessment of psychosocial needs.  Clinical Social Worker contacted patient by phone  to offer support and assess for needs.  CSW unable to reach patient, left VM to return phone call.      Gwinda Maine, LCSW  Clinical Social Worker [facility]

## 2019-04-14 ENCOUNTER — Ambulatory Visit
Admission: RE | Admit: 2019-04-14 | Discharge: 2019-04-14 | Disposition: A | Discharge status: Home / Self Care | Payer: PPO | Source: Ambulatory Visit | Attending: Radiation Oncology | Admitting: Radiation Oncology

## 2019-04-14 ENCOUNTER — Other Ambulatory Visit: Payer: Self-pay

## 2019-04-14 ENCOUNTER — Encounter: Payer: Self-pay | Admitting: *Deleted

## 2019-04-14 ENCOUNTER — Other Ambulatory Visit (HOSPITAL_COMMUNITY)
Admission: RE | Admit: 2019-04-14 | Discharge: 2019-04-14 | Disposition: A | Discharge status: Home / Self Care | Payer: PPO | Source: Ambulatory Visit | Attending: Radiation Oncology | Admitting: Radiation Oncology

## 2019-04-14 DIAGNOSIS — Z510 Encounter for antineoplastic radiation therapy: Secondary | ICD-10-CM | POA: Insufficient documentation

## 2019-04-14 DIAGNOSIS — Z01812 Encounter for preprocedural laboratory examination: Secondary | ICD-10-CM | POA: Insufficient documentation

## 2019-04-14 DIAGNOSIS — Z20828 Contact with and (suspected) exposure to other viral communicable diseases: Secondary | ICD-10-CM | POA: Insufficient documentation

## 2019-04-14 DIAGNOSIS — C01 Malignant neoplasm of base of tongue: Secondary | ICD-10-CM | POA: Insufficient documentation

## 2019-04-14 LAB — SARS CORONAVIRUS 2 (TAT 6-24 HRS): SARS Coronavirus 2: NEGATIVE

## 2019-04-14 NOTE — Progress Notes (Signed)
Oncology Nurse Navigator Documentation  To provide support, encouragement and care continuity, met with Ms. Jessica Sherman for her initial  RT.    She completed treatment without difficulty, denied questions/concerns.  I reviewed the registration/arrival procedure for subsequent treatments.  I encouraged her to call me with questions/concerns as tmts proceed.  She voiced understanding.  Gayleen Orem, RN, BSN Head & Neck Oncology Nurse [place] at [place] [phone]

## 2019-04-15 ENCOUNTER — Ambulatory Visit
Admission: RE | Admit: 2019-04-15 | Discharge: 2019-04-15 | Disposition: A | Discharge status: Home / Self Care | Payer: PPO | Source: Ambulatory Visit | Attending: Radiation Oncology | Admitting: Radiation Oncology

## 2019-04-15 ENCOUNTER — Ambulatory Visit: Payer: PPO

## 2019-04-15 ENCOUNTER — Encounter: Payer: Self-pay | Admitting: *Deleted

## 2019-04-15 ENCOUNTER — Other Ambulatory Visit: Payer: Self-pay

## 2019-04-15 DIAGNOSIS — C01 Malignant neoplasm of base of tongue: Secondary | ICD-10-CM | POA: Diagnosis not present

## 2019-04-15 DIAGNOSIS — Z510 Encounter for antineoplastic radiation therapy: Secondary | ICD-10-CM | POA: Diagnosis not present

## 2019-04-15 MED ORDER — SONAFINE EX EMUL
1.0000 "application " | Freq: Once | CUTANEOUS | Status: AC
  Administered 2019-04-15: 1 via TOPICAL

## 2019-04-15 NOTE — Progress Notes (Signed)

## 2019-04-15 NOTE — Progress Notes (Signed)
Oncology Nurse Navigator Documentation  Met Ms. Megan Salon in Radiation Waiting to provide further support, observe her compliance with monitor notification for/and her arrival to [facility] 4 dsg room.  She completed w/o issue.  I reminder her she will be taken to Nursing s/p tmt for Post SIM Ed and weekly PUT with Dr. Sondra Come.  Whe voiced understanding.  Gayleen Orem, RN, BSN Head & Neck Oncology Nurse [place] at [place] [phone]

## 2019-04-16 ENCOUNTER — Other Ambulatory Visit: Payer: Self-pay | Admitting: Radiology

## 2019-04-16 ENCOUNTER — Other Ambulatory Visit: Payer: Self-pay

## 2019-04-16 ENCOUNTER — Ambulatory Visit
Admission: RE | Admit: 2019-04-16 | Discharge: 2019-04-16 | Disposition: A | Discharge status: Home / Self Care | Payer: PPO | Source: Ambulatory Visit | Attending: Radiation Oncology | Admitting: Radiation Oncology

## 2019-04-16 DIAGNOSIS — Z510 Encounter for antineoplastic radiation therapy: Secondary | ICD-10-CM | POA: Diagnosis not present

## 2019-04-16 DIAGNOSIS — C01 Malignant neoplasm of base of tongue: Secondary | ICD-10-CM | POA: Diagnosis not present

## 2019-04-17 ENCOUNTER — Other Ambulatory Visit: Payer: Self-pay

## 2019-04-17 ENCOUNTER — Ambulatory Visit (HOSPITAL_COMMUNITY)
Admission: RE | Admit: 2019-04-17 | Discharge: 2019-04-17 | Disposition: A | Discharge status: Home / Self Care | Payer: PPO | Source: Ambulatory Visit | Attending: Radiation Oncology | Admitting: Radiation Oncology

## 2019-04-17 ENCOUNTER — Ambulatory Visit
Admission: RE | Admit: 2019-04-17 | Discharge: 2019-04-17 | Disposition: A | Discharge status: Home / Self Care | Payer: PPO | Source: Ambulatory Visit | Attending: Radiation Oncology | Admitting: Radiation Oncology

## 2019-04-17 ENCOUNTER — Encounter (HOSPITAL_COMMUNITY): Payer: Self-pay

## 2019-04-17 ENCOUNTER — Ambulatory Visit: Payer: PPO | Admitting: Radiation Oncology

## 2019-04-17 DIAGNOSIS — E119 Type 2 diabetes mellitus without complications: Secondary | ICD-10-CM | POA: Insufficient documentation

## 2019-04-17 DIAGNOSIS — Z431 Encounter for attention to gastrostomy: Secondary | ICD-10-CM | POA: Insufficient documentation

## 2019-04-17 DIAGNOSIS — E785 Hyperlipidemia, unspecified: Secondary | ICD-10-CM | POA: Insufficient documentation

## 2019-04-17 DIAGNOSIS — C01 Malignant neoplasm of base of tongue: Secondary | ICD-10-CM | POA: Diagnosis not present

## 2019-04-17 DIAGNOSIS — H3530 Unspecified macular degeneration: Secondary | ICD-10-CM | POA: Insufficient documentation

## 2019-04-17 DIAGNOSIS — Z510 Encounter for antineoplastic radiation therapy: Secondary | ICD-10-CM | POA: Diagnosis not present

## 2019-04-17 DIAGNOSIS — M8580 Other specified disorders of bone density and structure, unspecified site: Secondary | ICD-10-CM | POA: Insufficient documentation

## 2019-04-17 DIAGNOSIS — E039 Hypothyroidism, unspecified: Secondary | ICD-10-CM | POA: Diagnosis not present

## 2019-04-17 DIAGNOSIS — R633 Feeding difficulties: Secondary | ICD-10-CM | POA: Diagnosis not present

## 2019-04-17 DIAGNOSIS — Z79890 Hormone replacement therapy (postmenopausal): Secondary | ICD-10-CM | POA: Diagnosis not present

## 2019-04-17 DIAGNOSIS — Z79899 Other long term (current) drug therapy: Secondary | ICD-10-CM | POA: Insufficient documentation

## 2019-04-17 HISTORY — PX: IR GASTROSTOMY TUBE MOD SED: IMG625

## 2019-04-17 LAB — BASIC METABOLIC PANEL
Anion gap: 12 (ref 5–15)
BUN: 10 mg/dL (ref 8–23)
CO2: 27 mmol/L (ref 22–32)
Calcium: 10.2 mg/dL (ref 8.9–10.3)
Chloride: 84 mmol/L — ABNORMAL LOW (ref 98–111)
Creatinine, Ser: 0.64 mg/dL (ref 0.44–1.00)
GFR calc Af Amer: >60 mL/min (ref >60)
GFR calc non Af Amer: >60 mL/min (ref >60)
Glucose, Bld: 109 mg/dL — ABNORMAL HIGH (ref 70–99)
Potassium: 4.2 mmol/L (ref 3.5–5.1)
Sodium: 123 mmol/L — ABNORMAL LOW (ref 135–145)

## 2019-04-17 LAB — CBC
HCT: 33.2 % — ABNORMAL LOW (ref 36.0–46.0)
Hemoglobin: 12.1 g/dL (ref 12.0–15.0)
MCH: 35 pg — ABNORMAL HIGH (ref 26.0–34.0)
MCHC: 36.4 g/dL — ABNORMAL HIGH (ref 30.0–36.0)
MCV: 96 fL (ref 80.0–100.0)
Platelets: 318 10*3/uL (ref 150–400)
RBC: 3.46 MIL/uL — ABNORMAL LOW (ref 3.87–5.11)
RDW: 12 % (ref 11.5–15.5)
WBC: 11.3 10*3/uL — ABNORMAL HIGH (ref 4.0–10.5)
nRBC: 0 % (ref 0.0–0.2)

## 2019-04-17 LAB — PROTIME-INR
INR: 0.9 (ref 0.8–1.2)
Prothrombin Time: 11.7 seconds (ref 11.4–15.2)

## 2019-04-17 LAB — GLUCOSE, CAPILLARY: Glucose-Capillary: 95 mg/dL (ref 70–99)

## 2019-04-17 MED ORDER — CEFAZOLIN SODIUM-DEXTROSE 2-4 GM/100ML-% IV SOLN
INTRAVENOUS | Status: AC
  Administered 2019-04-17: 2 g via INTRAVENOUS
  Filled 2019-04-17: qty 100

## 2019-04-17 MED ORDER — GLUCAGON HCL RDNA (DIAGNOSTIC) 1 MG IJ SOLR
INTRAMUSCULAR | Status: AC
  Filled 2019-04-17: qty 1

## 2019-04-17 MED ORDER — FENTANYL CITRATE (PF) 100 MCG/2ML IJ SOLN
INTRAMUSCULAR | Status: AC
  Filled 2019-04-17: qty 2

## 2019-04-17 MED ORDER — MIDAZOLAM HCL 2 MG/2ML IJ SOLN
INTRAMUSCULAR | Status: AC
  Filled 2019-04-17: qty 2

## 2019-04-17 MED ORDER — LIDOCAINE HCL 1 % IJ SOLN
INTRAMUSCULAR | Status: AC
  Filled 2019-04-17: qty 20

## 2019-04-17 MED ORDER — FENTANYL CITRATE (PF) 100 MCG/2ML IJ SOLN
INTRAMUSCULAR | Status: AC | PRN
  Administered 2019-04-17: 25 ug via INTRAVENOUS

## 2019-04-17 MED ORDER — MIDAZOLAM HCL 2 MG/2ML IJ SOLN
INTRAMUSCULAR | Status: AC | PRN
  Administered 2019-04-17: 0.5 mg via INTRAVENOUS

## 2019-04-17 MED ORDER — SODIUM CHLORIDE 0.9 % IV SOLN
INTRAVENOUS | Status: DC
  Administered 2019-04-17: 13:00:00 via INTRAVENOUS

## 2019-04-17 MED ORDER — SODIUM CHLORIDE 0.9 % IV SOLN
8.0000 mg | INTRAVENOUS | Status: AC
  Administered 2019-04-17: 14:00:00 8 mg via INTRAVENOUS
  Filled 2019-04-17: qty 4

## 2019-04-17 MED ORDER — FENTANYL CITRATE (PF) 100 MCG/2ML IJ SOLN
INTRAMUSCULAR | Status: AC | PRN
  Administered 2019-04-17: 50 ug via INTRAVENOUS
  Administered 2019-04-17: 25 ug via INTRAVENOUS

## 2019-04-17 MED ORDER — MIDAZOLAM HCL 2 MG/2ML IJ SOLN
INTRAMUSCULAR | Status: AC | PRN
  Administered 2019-04-17: 0.5 mg via INTRAVENOUS
  Administered 2019-04-17: 1 mg via INTRAVENOUS

## 2019-04-17 MED ORDER — LIDOCAINE HCL (PF) 1 % IJ SOLN
INTRAMUSCULAR | Status: AC | PRN
  Administered 2019-04-17: 10 mL

## 2019-04-17 MED ORDER — GLUCAGON HCL (RDNA) 1 MG IJ SOLR
INTRAMUSCULAR | Status: AC | PRN
  Administered 2019-04-17: 1 mg via INTRAVENOUS

## 2019-04-17 MED ORDER — IOHEXOL 300 MG/ML  SOLN
50.0000 mL | Freq: Once | INTRAMUSCULAR | Status: AC | PRN
  Administered 2019-04-17: 20 mL

## 2019-04-17 MED ORDER — CEFAZOLIN SODIUM-DEXTROSE 2-4 GM/100ML-% IV SOLN
2.0000 g | INTRAVENOUS | Status: AC
  Administered 2019-04-17: 14:00:00 2 g via INTRAVENOUS

## 2019-04-17 NOTE — Discharge Instructions (Addendum)
Gastrostomy Tube Home Guide, Adult °A gastrostomy tube, or G-tube, is a tube that is inserted through the abdomen into the stomach. The tube is used to give feedings and medicines when a person is unable to eat and drink enough on his or her own. °How to care for a G-tube °Supplies needed °· Saline solution or clean, warm water and soap. °· Cotton swab or gauze. °· Precut gauze bandage (dressing) and tape, if needed. °Instructions °1. Wash your hands with soap and water. °2. If there is a dressing between the person's skin and the tube, remove it. °3. Check the area where the tube enters the skin. Check for problems such as: °? Redness. °? Swelling. °? Pus-like drainage. °? Extra skin growth. °4. Moisten the cotton swab with the saline solution or soap and water mixture. Gently clean around the insertion site. Remove any drainage or crusting. °? When the G-tube is first put in, a normal saline solution or water can be used to clean the skin. °? Mild soap and warm water can be used when the skin around the G-tube site has healed. °5. If there should be a dressing between the person's skin and the tube, apply it at this time. °How to flush a G-tube °Flush the G-tube regularly to keep it from clogging. Flush it before and after feedings and as often as told by the health care provider. °Supplies needed °· Purified or sterile water, warmed. If the person has a weak disease-fighting (immune) system, or if he or she has difficulty fighting off infections (is immunocompromised), use only sterile water. °? If you are unsure about the amount of chemical contaminants in purified or drinking water, use sterile water. °? To purify drinking water by boiling: °§ Boil water for at least 1 minute. Keep lid over water while it boils. Allow water to cool to room temperature before using. °· 60cc G-tube syringe. °Instructions °1. Wash your hands with soap and water. °2. Draw up 30 mL of warm water in a syringe. °3. Connect the syringe  to the tube. °4. Slowly and gently push the water into the tube. °G-tube problems and solutions °· If the tube comes out: °? Cover the opening with a clean dressing and tape. °? Call a health care provider right away. °? A health care provider will need to put the tube back in within 4 hours. °· If there is skin or scar tissue growing where the tube enters the skin: °? Keep the area clean and dry. °? Secure the tube with tape so that the tube does not move around too much. °? Call a health care provider. °· If the tube gets clogged: °? Slowly push warm water into the tube with a large syringe. °? Do not force the fluid into the tube or push an object into the tube. °? If you are not able to unclog the tube, call a health care provider right away. °Follow these instructions at home: °Feedings °· Give feedings at room temperature. °· Cover and place unused feedings in the refrigerator. °· If feedings are continuous: °? Do not put more than 4 hours worth of feedings in the feeding bag. °? Stop the feedings when you need to give medicine or flush the tube. Be sure to restart the feedings. °? Make sure the person's head is above his or her stomach (upright position). This will prevent choking and discomfort. °· Replace feeding bags and syringes as told by the health care provider. °· Make   sure the person is in the right position during and after feedings: ? During feedings, the person's position should be in the upright position. ? After a noncontinuous feeding (bolus feeding), have the person stay in the upright position for 1 hour. General instructions  Only use syringes made for G-tubes.  Do not pull or put tension on the tube.  Clamp the tube before removing the cap or disconnecting a syringe.  Measure the length of the G-tube every day from the insertion site to the end of the tube.  If the person's G-tube has a balloon, check the fluid in the balloon every week. The amount of fluid that should be in  the balloon can be found in the manufacturers specifications.  Make sure the person takes care of his or her oral health, such as by brushing his or her teeth.  Remove excess air from the G-tube as told by the person's health care provider. This is called "venting."  Keep the area where the tube enters the skin clean and dry.  Do not push feedings, medicines, or flushes rapidly. Contact a health care provider if:  The person with the tube has any of these problems: ? Constipation. ? Fever.  There is a large amount of fluid or mucus-like liquid leaking from the tube.  Skin or scar tissue appears to be growing where the tube enters the skin.  The length of tube from the insertion site to the G-tube gets longer. Get help right away if:  The person with the tube has any of these problems: ? Severe abdominal pain. ? Severe tenderness. ? Severe bloating. ? Nausea. ? Vomiting. ? Trouble breathing. ? Shortness of breath.  Any of these problems happen in the area where the tube enters the skin: ? Redness, irritation, swelling, or soreness. ? Pus-like discharge. ? A bad smell.  The tube is clogged and cannot be flushed.  The tube comes out. Summary  A gastrostomy tube, or G-tube, is a tube that is inserted through the abdomen into the stomach. The tube is used to give feedings and medicines when a person is unable to eat and drink enough on his or her own.  Check and clean the insertion site daily as told by the person's health care provider. is information is not intended to replace advice given to you by your health care provider. Make sure you discuss any questions you have with your health care provider. Document Released: 11/06/2001 Document Revised: 08/10/2017 Document Reviewed: 10/23/2016 Elsevier Patient Education  [place].  Moderate Conscious Sedation, Adult, Care After These instructions provide you with information about caring for yourself after your  procedure. Your health care provider may also give you more specific instructions. Your treatment has been planned according to current medical practices, but problems sometimes occur. Call your health care provider if you have any problems or questions after your procedure. What can I expect after the procedure? After your procedure, it is common:  To feel sleepy for several hours.  To feel clumsy and have poor balance for several hours.  To have poor judgment for several hours.  To vomit if you eat too soon. Follow these instructions at home: For at least 24 hours after the procedure:   Do not: ? Participate in activities where you could fall or become injured. ? Drive. ? Use heavy machinery. ? Drink alcohol. ? Take sleeping pills or medicines that cause drowsiness. ? Make important decisions or sign legal documents. ? Take  care of children on your own.  Rest. Eating and drinking  Follow the diet recommended by your health care provider.  If you vomit: ? Drink water, juice, or soup when you can drink without vomiting. ? Make sure you have little or no nausea before eating solid foods. General instructions  Have a responsible adult stay with you until you are awake and alert.  Take over-the-counter and prescription medicines only as told by your health care provider.  If you smoke, do not smoke without supervision.  Keep all follow-up visits as told by your health care provider. This is important. Contact a health care provider if:  You keep feeling nauseous or you keep vomiting.  You feel light-headed.  You develop a rash.  You have a fever. Get help right away if:  You have trouble breathing. This information is not intended to replace advice given to you by your health care provider. Make sure you discuss any questions you have with your health care provider. Document Released: 06/18/2013 Document Revised: 08/10/2017 Document Reviewed: 12/18/2015 Elsevier  Patient Education  [place].   Gastrostomy Tube Replacement, Care After This sheet gives you information about how to care for yourself after your procedure. Your health care provider may also give you more specific instructions. If you have problems or questions, contact your health care provider. What can I expect after the procedure? After the procedure, it is common to have:  Mild pain in your abdomen.  A small amount of blood-tinged fluid leaking from the replacement site. Follow these instructions at home:  You may resume your normal level of activity.  You may resume your normal feedings.  Wash your hands before and after caring for your gastrostomy tube.  Check the skin around the tube site insertion for redness, a rash, swelling, drainage, or extra tissue growth. If you notice any of these, call your health care provider.  Care for your gastrostomy tube as you did before, or as directed by your health care provider. Contact a health care provider if:  You have a fever or chills.  You have redness or irritation near the insertion site.  You continue to have pain in the abdomen or leaking around your gastrostomy tube. Get help right away if:  You develop bleeding or a lot of discharge around the tube.  You have severe pain in the abdomen.  Your new tube is not working properly.  You are unable to get feedings into the tube.  Your tube comes out for any reason. Summary  Follow specific instructions as told by your health care provider. If you have problems or questions, contact your health care provider.  After the procedure you may resume your normal level of activity and normal feedings.  Care for your gastrostomy tube  as directed by your health care provider.  Flush the G-tube regularly to keep it from clogging. Flush it before and after feedings and as often as told by the person's health care provider.  Flush G tube with 10 ml water every 12  hours.  May have thin clear liquids by mouth starting post procedure. May start feedings at 3pm 04/18/19. May have solid foods by mouth at 3pm tomorrow as well.  Keep the area where the tube enters the skin clean and dry. In 24 hours start washing with soap and water, when dry apply triple antibiotic and dressing for 7 days.  This information is not intended to replace advice given to you by your  health care provider. Make sure you discuss any questions you have with your health care provider. Document Released: 03/25/2014 Document Revised: 10/03/2017 Document Reviewed: 10/03/2017 Elsevier Patient Education  [place] after hours Emergency number (for day of procedure) [phone], for on call MD

## 2019-04-17 NOTE — H&P (Signed)
Chief Complaint: Patient was seen in consultation today for cancer of base of tongue/gastrostomy tube placement.  Referring Physician(s): [name]  Supervising Physician: Sandi Mariscal  Patient Status: [facility] - Out-pt  History of Present Illness: Jessica Sherman is a 85 y.o. female with a past medical history of hyperlipidemia, vertigo, hypothyroidism, diabetes mellitus, cancer of base of tongue, osteopenia, and macular degeneration. She was unfortunately diagnosed with cancer of the base of tongue in 03/2019. Her cancer is managed by Dr. Isidore Moos. She has tentative plans to begin radiation therapy.  IR requested by Dr. Isidore Moos for possible image-guided percutaneous gastrostomy tube placement. Patient awake and alert laying in bed with no complaints at this time. Denies fever, chills, chest pain, dyspnea, abdominal pain, or headache.   Past Medical History:  Diagnosis Date   Hx of colonic polyps    Hyperlipidemia    Hypothyroidism    Macular degeneration, dry    Osteopenia    spine BMD T score - 1.69   PONV (postoperative nausea and vomiting)    pt has vertigo and can not lie flat   Prediabetes    FBS 136  on 08/29/2010; A1c 6.2%   Vertigo     Past Surgical History:  Procedure Laterality Date   CATARACT EXTRACTION  2009   OS   COLONOSCOPY W/ POLYPECTOMY     X 2; last 2012. Dr Watt Climes   IR GENERIC HISTORICAL  05/09/2016   IR US GUIDE VASC ACCESS RIGHT 05/09/2016 Jacqulynn Cadet, MD [facility]-INTERV RAD   IR GENERIC HISTORICAL  05/09/2016   IR FLUORO GUIDE CV LINE RIGHT 05/09/2016 Jacqulynn Cadet, MD [facility]-INTERV RAD   MODIFIED MASTECTOMY Left 10/18/2016   Procedure: LEFT MODIFIED RADICAL MASTECTOMY;  Surgeon: [name], MD;  Location: [facility];  Service: General;  Laterality: Left;   PILONIDAL CYST EXCISION     X 3   TONSILLECTOMY AND ADENOIDECTOMY     TOTAL ABDOMINAL HYSTERECTOMY     w/ bladder repair     Allergies: Other  Medications: Prior to Admission medications   Medication Sig Start Date End Date Taking? Authorizing Provider  amLODipine (NORVASC) 5 MG tablet Take 1 tablet (5 mg total) by mouth daily. Patient taking differently: Take 5 mg by mouth at bedtime.  10/31/18   [name], MD  Calcium Carb-Cholecalciferol (CALCIUM 600+D3 PO) Take 1 tablet by mouth 2 (two) times daily.    [provider]  Cholecalciferol (VITAMIN D3) 1000 UNITS CAPS Take 1,000 Units by mouth daily.     [provider]  levothyroxine (SYNTHROID) 88 MCG tablet TAKE 1 TABLET BY MOUTH EVERY DAY EXCEPT ON WEDNESDAY TAKE 1/2 TABLET Patient taking differently: Take 88 mcg by mouth See admin instructions. TAKE 1 TABLET BY MOUTH EVERY DAY EXCEPT ON WEDNESDAY TAKE 1/2 TABLET 02/24/19   Burns, Claudina Lick, MD  LORazepam (ATIVAN) 0.5 MG tablet Take 0.5 tablets (0.25 mg total) by mouth 2 (two) times daily as needed for anxiety. 04/04/19   [name], MD  Multiple Vitamins-Minerals (PRESERVISION AREDS PO) Take by mouth.    [provider]  simvastatin (ZOCOR) 20 MG tablet Take 1 tablet (20 mg total) by mouth at bedtime. 10/23/18   [name], MD  sodium fluoride (PREVIDENT 5000 PLUS) 1.1 % CREA dental cream Place gel into fluoride tray at bedtime.  Place over teeth for 5 minutes.  Remove.  Spit out excess.  Repeat nightly. 03/27/19   Lenn Cal, DDS  prochlorperazine (COMPAZINE) 10 MG  tablet Take 1 tablet (10 mg total) by mouth every 6 (six) hours as needed (Nausea or vomiting). Patient not taking: Reported on 07/27/2016 05/01/16 09/18/16  [name], MD     Family History  Problem Relation Age of Onset   Osteoporosis Mother    Leukemia Mother    Cancer Maternal Uncle        ? stomach   Aneurysm Father        AAA   Heart attack Neg Hx     Social History   Socioeconomic History   Marital status: Widowed    Spouse name: Not on file   Number of children: 4   Years of  education: Not on file   Highest education level: Not on file  Occupational History   Occupation: retired  Scientist, product/process development strain: Not on file   Food insecurity    Worry: Not on file    Inability: Not on Lexicographer needs    Medical: No    Non-medical: No  Tobacco Use   Smoking status: Never Smoker   Smokeless tobacco: Never Used  Substance and Sexual Activity   Alcohol use: No    Comment:     Drug use: No   Sexual activity: Not on file  Lifestyle   Physical activity    Days per week: Not on file    Minutes per session: Not on file   Stress: Not on file  Relationships   Social connections    Talks on phone: Not on file    Gets together: Not on file    Attends religious service: Not on file    Active member of club or organization: Not on file    Attends meetings of clubs or organizations: Not on file    Relationship status: Not on file  Other Topics Concern   Not on file  Social History Narrative   Regular exercise- yes 79mpd w/o symptoms     Review of Systems: A 12 point ROS discussed and pertinent positives are indicated in the HPI above.  All other systems are negative.  Review of Systems  Constitutional: Negative for chills and fever.  Respiratory: Negative for shortness of breath and wheezing.   Cardiovascular: Negative for chest pain and palpitations.  Gastrointestinal: Negative for abdominal pain.  Neurological: Negative for headaches.  Psychiatric/Behavioral: Negative for behavioral problems and confusion.    Vital Signs: BP (!) 160/69 (BP Location: Right Arm)    Pulse 75    Temp 98.2 F (36.8 C) (Oral)    Resp 16    SpO2 100%   Physical Exam Vitals signs and nursing note reviewed.  Constitutional:      General: She is not in acute distress.    Appearance: Normal appearance.  Cardiovascular:     Rate and Rhythm: Normal rate and regular rhythm.     Heart sounds: Normal heart sounds. No murmur.  Pulmonary:      Effort: Pulmonary effort is normal. No respiratory distress.     Breath sounds: Normal breath sounds. No wheezing.  Skin:    General: Skin is warm and dry.  Neurological:     Mental Status: She is alert and oriented to person, place, and time.  Psychiatric:        Mood and Affect: Mood normal.        Behavior: Behavior normal.        Thought Content: Thought content normal.  Judgment: Judgment normal.      MD Evaluation Airway: WNL Heart: WNL Abdomen: WNL Chest/ Lungs: WNL ASA  Classification: 3 Mallampati/Airway Score: Two   Imaging: Nm Pet Image Initial (pi) Skull Base To Thigh  Result Date: 03/19/2019 CLINICAL DATA:  Initial treatment strategy for tongue base neoplasm. EXAM: NUCLEAR MEDICINE PET SKULL BASE TO THIGH TECHNIQUE: 5.68 mCi F-18 FDG was injected intravenously. Full-ring PET imaging was performed from the skull base to thigh after the radiotracer. CT data was obtained and used for attenuation correction and anatomic localization. Fasting blood glucose: 120 mg/dl COMPARISON:  Neck CT 03/13/2019 FINDINGS: Mediastinal blood pool activity: SUV max 2.24 Liver activity: SUV max NA NECK: Large right-sided tongue base mass extending into the right tonsillar and vallecular regions. This is markedly hypermetabolic with SUV max of 65.99. No enlarged or hypermetabolic neck or supraclavicular lymph nodes. There is fairly extensive hypermetabolic brown fat noted mainly in the supraclavicular areas. Incidental CT findings: none CHEST: Emphysematous changes and pulmonary scarring but no acute pulmonary findings. 4 mm sub solid nodule in the right middle lobe is indeterminate. It was not present on the prior chest CT from 2017. It could be inflammatory. Follow-up noncontrast chest CT in 6-12 months is suggested. Small nodule along the left major fissure was present on 2017 and is likely a lymph node. No supraclavicular or axillary hypermetabolic adenopathy. No mediastinal or hilar  adenopathy. Incidental CT findings: Surgical changes from a left mastectomy. ABDOMEN/PELVIS: No abnormal hypermetabolic activity within the liver, pancreas, adrenal glands, or spleen. No hypermetabolic lymph nodes in the abdomen or pelvis. Incidental CT findings: none SKELETON: No findings for metastatic bone disease. Areas of hypermetabolism in the intercostal muscles in the chest are noted. This is a benign finding. Incidental CT findings: none IMPRESSION: 1. Large right tongue base mass is markedly hypermetabolic and consistent with neoplasm. 2. No neck adenopathy or evidence of metastatic disease in the chest, abdomen or pelvis. 3. Single 4 mm nodule in the right middle lobe is indeterminate. Recommend follow-up noncontrast chest CT in 6-12 months. 4. Surgical changes from a left mastectomy. No chest wall mass or supraclavicular or axillary adenopathy. Electronically Signed   By: Marijo Sanes M.D.   On: 03/19/2019 12:37   Dg Chest Portable 1 View  Result Date: 03/28/2019 CLINICAL DATA:  Weakness and hyponatremia. History of throat carcinoma EXAM: PORTABLE CHEST 1 VIEW COMPARISON:  August 29, 2010 FINDINGS: Lungs are clear. Heart size and pulmonary vascularity are normal. No adenopathy. There are surgical clips in left axillary region. Bones are osteoporotic. IMPRESSION: No edema or consolidation.  No evident adenopathy. Electronically Signed   By: Lowella Grip III M.D.   On: 03/28/2019 13:41    Labs:  CBC: Recent Labs    01/27/19 1424 03/28/19 1148 03/29/19 0439 04/17/19 1252  WBC 9.3 14.4* 11.8* 11.3*  HGB 12.0 11.8* 11.5* 12.1  HCT 34.3* 36.1 34.8* 33.2*  PLT 270.0 250 332 318    COAGS: No results for input(s): INR, APTT in the last 8760 hours.  BMP: Recent Labs    03/28/19 1148 03/28/19 2105 03/29/19 0439 03/29/19 0932  NA 120* 134* 132* 130*  K 3.6 3.9 4.4 4.3  CL 84* 100 96* 94*  CO2 30 26 27 26   GLUCOSE 140* 106* 101* 127*  BUN 18 15 13 13   CALCIUM 9.4 9.1 9.8  9.3  CREATININE 0.75 0.55 0.65 0.67  GFRNONAA >60 >60 >60 >60  GFRAA >60 >60 >60 >60  LIVER FUNCTION TESTS: Recent Labs    06/19/18 1438 01/27/19 1424 03/28/19 1148 03/29/19 0439  BILITOT 0.3 0.4 0.4 0.8  AST 18 16 15 18   ALT 16 13 13 15   ALKPHOS 61 66 69 65  PROT 7.3 7.4 7.3 7.6  ALBUMIN 4.1 4.1 4.1 3.6     Assessment and Plan:  Cancer of base of tongue. Plan for image-guided percutaneous gastrostomy tube placement today with Dr. Pascal Lux. Patient is NPO. Afebrile. She does not take blood thinners. INR pending.  Patient states that she has special requests regarding procedure and asked that I call her daughter, Jessica Sherman, prior to procedure. Spoke with Jessica Sherman who states that her mother has severe N/V associated with sedative medications and states Zofran works for her- will order Zofran IV for post-procedure. In addition, states that she has severe vertigo with laying flat and asks that she has two pillows during procedure. Discussed with Dr. Pascal Lux who approves this.  Risks and benefits discussed with the patient including, but not limited to the need for a barium enema during the procedure, bleeding, infection, peritonitis, or damage to adjacent structures. All of the patient's questions were answered, patient is agreeable to proceed. Consent signed and in chart.   Thank you for this interesting consult.  I greatly enjoyed meeting Jessica Sherman and look forward to participating in their care.  A copy of this report was sent to the requesting provider on this date.  Electronically Signed: Earley Abide, PA-C 04/17/2019, 1:24 PM   I spent a total of 40 Minutes in face to face in clinical consultation, greater than 50% of which was counseling/coordinating care for cancer of base of tongue/gastrostomy tube placement.

## 2019-04-17 NOTE — Progress Notes (Signed)
IR.  Patient underwent an image-guided percutaneous gastrostomy tube placement in IR today by Dr. Pascal Lux. She has a known history of post-operative N/V.  Patient was in wheelchair ready for discharge when she developed nausea and vomited x1. States she has not had N/V since and states she feels "much better now". Discussed case with Dr. Pascal Lux who recommends PRN nausea medication x 1 week. Gave patient prescription for Zofran ODT 4 mg tablets, take one tablet by mouth Q8H PRN N/V, dispense 21 tablets with 0 refills. Patient stable for discharge.  Please call IR with questions/concerns.   Bea Graff Sheila Ocasio, PA-C 04/17/2019, 5:17 PM

## 2019-04-18 ENCOUNTER — Other Ambulatory Visit: Payer: Self-pay

## 2019-04-18 ENCOUNTER — Ambulatory Visit
Admission: RE | Admit: 2019-04-18 | Discharge: 2019-04-18 | Disposition: A | Discharge status: Home / Self Care | Payer: PPO | Source: Ambulatory Visit | Attending: Radiation Oncology | Admitting: Radiation Oncology

## 2019-04-18 ENCOUNTER — Inpatient Hospital Stay: Payer: Self-pay | Attending: Hematology and Oncology | Admitting: Nutrition

## 2019-04-18 ENCOUNTER — Ambulatory Visit: Payer: PPO

## 2019-04-18 DIAGNOSIS — Z510 Encounter for antineoplastic radiation therapy: Secondary | ICD-10-CM | POA: Diagnosis not present

## 2019-04-18 DIAGNOSIS — C01 Malignant neoplasm of base of tongue: Secondary | ICD-10-CM | POA: Diagnosis not present

## 2019-04-18 NOTE — Progress Notes (Signed)
Nutrition follow-up completed with patient and daughter status post PEG placement yesterday. 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 Weight is stable and was documented as 106.6 pounds today. Patient reports her feeding tube site is sore. She continues to verbalize great fear having radiation therapy.  She reports she gets vertigo and is afraid she is going to vomit during radiation treatments. She is taking Ativan prior to treatment but continues to experience great anxiety. Patient is overwhelmed with new information. Daughter has been flushing her feeding tube but has not given a feeding yet.  She has been told to wait until this afternoon before using it for nutrition. Patient reports she loves to drink Ensure Plus and has good tolerance of this and denies nausea.  Nutrition diagnosis: Predicted suboptimal energy intake continues.  Intervention: Educated patient and daughter on process of infusing Osmolite 1.5 via feeding tube.  We could not practice this as she cannot use her tube for nutrition until later today. Educated patient on the importance of consuming soft moist foods at least at mealtimes and increase Ensure Plus or Ensure Enlive 3 times daily between meals. Provided 1 complementary case of Ensure Enlive. Encourage patient to communicate nausea or pain. Refer patient to chaplain/social social worker for ideas to reduce anxiety.  Monitoring, evaluation, goals: Patient will tolerate oral intake/tube feeding to minimize weight loss.  Next visit: Friday, August 14 after radiation treatment.  **Disclaimer: This note was dictated with voice recognition software. Similar sounding words can inadvertently be transcribed and this note may contain transcription errors which may not have been corrected upon publication of note.**

## 2019-04-21 ENCOUNTER — Ambulatory Visit
Admission: RE | Admit: 2019-04-21 | Discharge: 2019-04-21 | Disposition: A | Discharge status: Home / Self Care | Payer: PPO | Source: Ambulatory Visit | Attending: Radiation Oncology | Admitting: Radiation Oncology

## 2019-04-21 ENCOUNTER — Other Ambulatory Visit: Payer: Self-pay

## 2019-04-21 ENCOUNTER — Other Ambulatory Visit: Payer: Self-pay | Admitting: Radiation Oncology

## 2019-04-21 DIAGNOSIS — C01 Malignant neoplasm of base of tongue: Secondary | ICD-10-CM | POA: Diagnosis not present

## 2019-04-21 DIAGNOSIS — Z510 Encounter for antineoplastic radiation therapy: Secondary | ICD-10-CM | POA: Diagnosis not present

## 2019-04-21 MED ORDER — LIDOCAINE VISCOUS HCL 2 % MT SOLN: OROMUCOSAL | 5 refills | Status: DC

## 2019-04-21 MED ORDER — FLUCONAZOLE 100 MG PO TABS: ORAL_TABLET | ORAL | 0 refills | Status: DC

## 2019-04-22 ENCOUNTER — Other Ambulatory Visit: Payer: Self-pay

## 2019-04-22 ENCOUNTER — Ambulatory Visit
Admission: RE | Admit: 2019-04-22 | Discharge: 2019-04-22 | Disposition: A | Discharge status: Home / Self Care | Payer: PPO | Source: Ambulatory Visit | Attending: Radiation Oncology | Admitting: Radiation Oncology

## 2019-04-22 DIAGNOSIS — Z510 Encounter for antineoplastic radiation therapy: Secondary | ICD-10-CM | POA: Diagnosis not present

## 2019-04-22 DIAGNOSIS — C01 Malignant neoplasm of base of tongue: Secondary | ICD-10-CM | POA: Diagnosis not present

## 2019-04-23 ENCOUNTER — Other Ambulatory Visit: Payer: Self-pay

## 2019-04-23 ENCOUNTER — Ambulatory Visit
Admission: RE | Admit: 2019-04-23 | Discharge: 2019-04-23 | Disposition: A | Discharge status: Home / Self Care | Payer: PPO | Source: Ambulatory Visit | Attending: Radiation Oncology | Admitting: Radiation Oncology

## 2019-04-23 DIAGNOSIS — C01 Malignant neoplasm of base of tongue: Secondary | ICD-10-CM | POA: Diagnosis not present

## 2019-04-23 DIAGNOSIS — Z510 Encounter for antineoplastic radiation therapy: Secondary | ICD-10-CM | POA: Diagnosis not present

## 2019-04-24 ENCOUNTER — Ambulatory Visit: Payer: PPO

## 2019-04-24 ENCOUNTER — Other Ambulatory Visit: Payer: Self-pay

## 2019-04-24 ENCOUNTER — Ambulatory Visit
Admission: RE | Admit: 2019-04-24 | Discharge: 2019-04-24 | Disposition: A | Discharge status: Home / Self Care | Payer: PPO | Source: Ambulatory Visit | Attending: Radiation Oncology | Admitting: Radiation Oncology

## 2019-04-24 DIAGNOSIS — Z510 Encounter for antineoplastic radiation therapy: Secondary | ICD-10-CM | POA: Diagnosis not present

## 2019-04-24 DIAGNOSIS — C01 Malignant neoplasm of base of tongue: Secondary | ICD-10-CM | POA: Diagnosis not present

## 2019-04-25 ENCOUNTER — Other Ambulatory Visit: Payer: Self-pay

## 2019-04-25 ENCOUNTER — Ambulatory Visit: Payer: PPO | Admitting: Nutrition

## 2019-04-25 ENCOUNTER — Ambulatory Visit
Admission: RE | Admit: 2019-04-25 | Discharge: 2019-04-25 | Disposition: A | Discharge status: Home / Self Care | Payer: PPO | Source: Ambulatory Visit | Attending: Radiation Oncology | Admitting: Radiation Oncology

## 2019-04-25 DIAGNOSIS — Z510 Encounter for antineoplastic radiation therapy: Secondary | ICD-10-CM | POA: Diagnosis not present

## 2019-04-25 DIAGNOSIS — C01 Malignant neoplasm of base of tongue: Secondary | ICD-10-CM | POA: Diagnosis not present

## 2019-04-25 NOTE — Progress Notes (Signed)
Patient did not wish to stay for nutrition follow-up today.  She gave permission for me to call her daughter Jessica Sherman on the phone. Weight documented as 107.2 pounds August 10. Patient is eating well and consuming water and Ensure. Daughter is not certain on how many Ensure patient is drinking. No nutrition impact symptoms yet. Per daughter, patient is feeling better before her radiation therapy. Daughter is feeling overwhelmed as she is the main support for her mother.  Nutrition diagnosis:  Predicted suboptimal energy intake continues.  Intervention: Educated patient daughter to continue flushing feeding tube and cleaning site daily. Encouraged her to enforced the importance of adequate nutrition intake, especially Ensure Enlive 3 times daily. Notify me when patient requires additional Ensure Enlive.  Monitoring, evaluation, goals: Patient will continue to tolerate oral diet for weight maintenance. Will monitor weight loss and begin tube feeding as needed.  Next visit: Telephone visit on Thursday, August 20 with daughter Jessica Sherman.  **Disclaimer: This note was dictated with voice recognition software. Similar sounding words can inadvertently be transcribed and this note may contain transcription errors which may not have been corrected upon publication of note.**

## 2019-04-28 ENCOUNTER — Other Ambulatory Visit: Payer: Self-pay | Admitting: Radiation Oncology

## 2019-04-28 ENCOUNTER — Telehealth: Payer: Self-pay | Admitting: *Deleted

## 2019-04-28 ENCOUNTER — Other Ambulatory Visit: Payer: Self-pay

## 2019-04-28 ENCOUNTER — Ambulatory Visit
Admission: RE | Admit: 2019-04-28 | Discharge: 2019-04-28 | Disposition: A | Discharge status: Home / Self Care | Payer: PPO | Source: Ambulatory Visit | Attending: Radiation Oncology | Admitting: Radiation Oncology

## 2019-04-28 DIAGNOSIS — C01 Malignant neoplasm of base of tongue: Secondary | ICD-10-CM | POA: Diagnosis not present

## 2019-04-28 DIAGNOSIS — Z510 Encounter for antineoplastic radiation therapy: Secondary | ICD-10-CM | POA: Diagnosis not present

## 2019-04-28 MED ORDER — LORAZEPAM 0.5 MG PO TABS: 0.2500 mg | ORAL_TABLET | Freq: Two times a day (BID) | ORAL | 0 refills | Status: DC | PRN

## 2019-04-28 NOTE — Telephone Encounter (Signed)
[place] Work  Clinical Social Work was referred by dietitian for assessment of psychosocial needs, specifically adjusting to illness. Dietitian suggested [place] contact patient's daughter. Clinical Social Worker attempted to contact patient's daughter to offer support and assess for needs.  CSW left VM to return call when convenient.   Gwinda Maine, LCSW  Clinical Social Worker [facility]

## 2019-04-29 ENCOUNTER — Other Ambulatory Visit: Payer: Self-pay

## 2019-04-29 ENCOUNTER — Ambulatory Visit
Admission: RE | Admit: 2019-04-29 | Discharge: 2019-04-29 | Disposition: A | Discharge status: Home / Self Care | Payer: PPO | Source: Ambulatory Visit | Attending: Radiation Oncology | Admitting: Radiation Oncology

## 2019-04-29 DIAGNOSIS — C01 Malignant neoplasm of base of tongue: Secondary | ICD-10-CM | POA: Diagnosis not present

## 2019-04-29 DIAGNOSIS — Z510 Encounter for antineoplastic radiation therapy: Secondary | ICD-10-CM | POA: Diagnosis not present

## 2019-04-30 ENCOUNTER — Other Ambulatory Visit: Payer: Self-pay

## 2019-04-30 ENCOUNTER — Ambulatory Visit
Admission: RE | Admit: 2019-04-30 | Discharge: 2019-04-30 | Disposition: A | Discharge status: Home / Self Care | Payer: PPO | Source: Ambulatory Visit | Attending: Radiation Oncology | Admitting: Radiation Oncology

## 2019-04-30 DIAGNOSIS — C01 Malignant neoplasm of base of tongue: Secondary | ICD-10-CM | POA: Diagnosis not present

## 2019-04-30 DIAGNOSIS — Z510 Encounter for antineoplastic radiation therapy: Secondary | ICD-10-CM | POA: Diagnosis not present

## 2019-05-01 ENCOUNTER — Inpatient Hospital Stay: Payer: Self-pay | Admitting: Nutrition

## 2019-05-01 ENCOUNTER — Ambulatory Visit
Admission: RE | Admit: 2019-05-01 | Discharge: 2019-05-01 | Disposition: A | Discharge status: Home / Self Care | Payer: PPO | Source: Ambulatory Visit | Attending: Radiation Oncology | Admitting: Radiation Oncology

## 2019-05-01 ENCOUNTER — Other Ambulatory Visit: Payer: Self-pay

## 2019-05-01 DIAGNOSIS — C01 Malignant neoplasm of base of tongue: Secondary | ICD-10-CM | POA: Diagnosis not present

## 2019-05-01 DIAGNOSIS — Z510 Encounter for antineoplastic radiation therapy: Secondary | ICD-10-CM | POA: Diagnosis not present

## 2019-05-01 NOTE — Progress Notes (Signed)
Nutrition follow-up completed with patient over the phone She is getting radiation therapy for tongue cancer. Weight documented as 105.2 pounds on August 17. Patient reports she continues to have taste alterations. Patient reports she is hungry. She is drinking 3 Ensure Enlive and putting 1 Osmolite 1.5 via her feeding tube. She would like to eat more soft foods. She reports she is going to try to put more Osmolite 1.5 in her tube.  Nutrition diagnosis: Predicted suboptimal energy intake has evolved into inadequate oral intake related to tongue cancer as evidenced by patient's self-report of inadequate intake and increased hunger.  Intervention: Educated patient on soft smooth foods to work into her meals and snacks.  Continue minimum of 3 Ensure Enlive daily.  Increase Osmolite 1.5 via tube twice daily.  Monitoring, evaluation, goals: Patient will tolerate increased oral intake to minimize weight loss.  Monitoring, evaluation, goals: Patient will tolerate increased at oral intake to promote weight stabilization and adequate calories and protein for healing.  Next visit: To be scheduled.

## 2019-05-02 ENCOUNTER — Other Ambulatory Visit: Payer: Self-pay

## 2019-05-02 ENCOUNTER — Telehealth: Payer: Self-pay

## 2019-05-02 ENCOUNTER — Ambulatory Visit
Admission: RE | Admit: 2019-05-02 | Discharge: 2019-05-02 | Disposition: A | Discharge status: Home / Self Care | Payer: PPO | Source: Ambulatory Visit | Attending: Radiation Oncology | Admitting: Radiation Oncology

## 2019-05-02 DIAGNOSIS — Z510 Encounter for antineoplastic radiation therapy: Secondary | ICD-10-CM | POA: Diagnosis not present

## 2019-05-02 DIAGNOSIS — C01 Malignant neoplasm of base of tongue: Secondary | ICD-10-CM | POA: Diagnosis not present

## 2019-05-02 NOTE — Telephone Encounter (Signed)
Copied from [place] [id number]. Topic: General - Other >> May 02, 2019  2:07 PM Celene Kras A wrote: Reason for CRM: Arville Go, called stating she checking on the referral for the home based palliative care from [facility]. Pricilla Holm needs the PCP to call back to give them permission to move forward and speak to the pt. Please advise.

## 2019-05-02 NOTE — Telephone Encounter (Signed)
Ok with referral. 

## 2019-05-05 ENCOUNTER — Telehealth: Payer: Self-pay

## 2019-05-05 ENCOUNTER — Other Ambulatory Visit: Payer: Self-pay | Admitting: *Deleted

## 2019-05-05 ENCOUNTER — Other Ambulatory Visit: Payer: Self-pay

## 2019-05-05 ENCOUNTER — Other Ambulatory Visit: Payer: Self-pay | Admitting: Radiation Oncology

## 2019-05-05 ENCOUNTER — Encounter: Payer: Self-pay | Admitting: *Deleted

## 2019-05-05 ENCOUNTER — Ambulatory Visit
Admission: RE | Admit: 2019-05-05 | Discharge: 2019-05-05 | Disposition: A | Discharge status: Home / Self Care | Payer: PPO | Source: Ambulatory Visit | Attending: Radiation Oncology | Admitting: Radiation Oncology

## 2019-05-05 ENCOUNTER — Telehealth: Payer: Self-pay | Admitting: *Deleted

## 2019-05-05 DIAGNOSIS — C01 Malignant neoplasm of base of tongue: Secondary | ICD-10-CM

## 2019-05-05 DIAGNOSIS — Z510 Encounter for antineoplastic radiation therapy: Secondary | ICD-10-CM | POA: Diagnosis not present

## 2019-05-05 MED ORDER — CITALOPRAM HYDROBROMIDE 10 MG PO TABS: 10.0000 mg | ORAL_TABLET | Freq: Every day | ORAL | 0 refills | Status: DC

## 2019-05-05 NOTE — Telephone Encounter (Signed)
CALLED PATIENT TO INFORM OF APPT. WITH DR. Marzetta Board BURNS ON 05-20-19- ARRIVAL TIME- 9:30 AM - ADDRESS - [address] - PHONE NUMBER - [phone], SPOKE WITH PATIENT AND SHE IS AWARE OF THIS APPT.

## 2019-05-05 NOTE — Telephone Encounter (Signed)
Yes, she has had more frequent doctor visits due to a recent diagnosis of cancer.    We should reach out to patient - she may not feel she needs this at this time.  Her daughter is very involved and she is following with oncology.     Please call patient and see if she would like extra help/assistance.

## 2019-05-05 NOTE — Progress Notes (Signed)
Oncology Nurse Navigator Documentation  Met with Ms. Megan Salon after XRT, escorted her to and joined her for weekly PUT with Dr. Isidore Moos. Acknowledged she will have finished half her treatments this Wed, noted diligence with Sonafine application, oral intake. Informed her she will meet with SLP Garald Balding tomorrow morning after XRT. Escorted her to [facility] entrance where she was met by her dtr-in-law.  Gayleen Orem, RN, BSN Head & Neck Oncology Nurse [place] at [place] [phone]

## 2019-05-05 NOTE — Telephone Encounter (Signed)
Copied from [place] [id number]. Topic: General - Other >> May 02, 2019  2:07 PM Celene Kras A wrote: Reason for CRM: Arville Go, called stating she checking on the referral for the home based palliative care from [facility]. Pricilla Holm needs the PCP to call back to give them permission to move forward and speak to the pt. Please advise.

## 2019-05-05 NOTE — Telephone Encounter (Signed)
Spoke with [place] with the home based palliative care program through [facility]. [facility] noticed pt has been in and out of hospital and more frequent doctors visit. They reached out to the program to advise pt would benefit due to her chronic condition. This program is to help support patients who are not ready for hospice but could use help at home. It is free for patient. It helps pt reduce hospital visits and they could be there 24/7 whenever the pt needs them. They would like to have your permission before moving forward.

## 2019-05-06 ENCOUNTER — Ambulatory Visit: Payer: PPO | Attending: Radiation Oncology

## 2019-05-06 ENCOUNTER — Other Ambulatory Visit: Payer: Self-pay

## 2019-05-06 ENCOUNTER — Encounter: Payer: Self-pay | Admitting: *Deleted

## 2019-05-06 ENCOUNTER — Ambulatory Visit
Admission: RE | Admit: 2019-05-06 | Discharge: 2019-05-06 | Disposition: A | Discharge status: Home / Self Care | Payer: PPO | Source: Ambulatory Visit | Attending: Radiation Oncology | Admitting: Radiation Oncology

## 2019-05-06 DIAGNOSIS — C01 Malignant neoplasm of base of tongue: Secondary | ICD-10-CM | POA: Diagnosis not present

## 2019-05-06 DIAGNOSIS — Z510 Encounter for antineoplastic radiation therapy: Secondary | ICD-10-CM | POA: Diagnosis not present

## 2019-05-06 DIAGNOSIS — R1310 Dysphagia, unspecified: Secondary | ICD-10-CM

## 2019-05-06 NOTE — Therapy (Signed)
[facility] [address] [place], [place], [address] Phone: [phone]   Fax:  [phone]  Speech Language Pathology Evaluation  Patient Details  Name: Jessica Sherman MRN: [id number] Date of Birth: [birth year] Referring Provider (SLP): [name], MD   Encounter Date: 05/06/2019  End of Session - 05/06/19 1118    Visit Number  1    Number of Visits  3    Date for SLP Re-Evaluation  08/04/19    SLP Start Time  0835    SLP Stop Time   0911    SLP Time Calculation (min)  36 min    Activity Tolerance  Patient tolerated treatment well       Past Medical History:  Diagnosis Date  . Hx of colonic polyps   . Hyperlipidemia   . Hypothyroidism   . Macular degeneration, dry   . Osteopenia    spine BMD T score - 1.69  . PONV (postoperative nausea and vomiting)    pt has vertigo and can not lie flat  . Prediabetes    FBS 136  on 08/29/2010; A1c 6.2%  . Vertigo     Past Surgical History:  Procedure Laterality Date  . CATARACT EXTRACTION  2009   OS  . COLONOSCOPY W/ POLYPECTOMY     X 2; last 2012. Dr Watt Climes  . IR GASTROSTOMY TUBE MOD SED  04/17/2019  . IR GENERIC HISTORICAL  05/09/2016   IR US GUIDE VASC ACCESS RIGHT 05/09/2016 Jacqulynn Cadet, MD [facility]-INTERV RAD  . IR GENERIC HISTORICAL  05/09/2016   IR FLUORO GUIDE CV LINE RIGHT 05/09/2016 Jacqulynn Cadet, MD [facility]INTER[facility]  . MODIFIED MASTECTOMY Left 10/18/2016   Procedure: LEFT MODIFIED RADICAL MASTECTOMY;  Surgeon: [name], MD;  Location: [facility];  Service: General;  Laterality: Left;  . PILONIDAL CYST EXCISION     X 3  . TONSILLECTOMY AND ADENOIDECTOMY    . TOTAL ABDOMINAL HYSTERECTOMY     w/ bladder repair    There were no vitals filed for this visit.  Subjective Assessment - 05/06/19 0836    Subjective  ""I just want to sleep" Pt eats Dys 1 items with many liquids.    Currently in Pain?  No/denies         SLP Evaluation  [facility] - 05/06/19 [id number]      SLP Visit Information   SLP Received On  05/06/19    Referring Provider (SLP)  [name], MD      General Information   HPI  Pt diagnosed with rt base of tongue CA. Radiation to tumor bed and bil neck began 04-14-19. PEG inserted 04-17-19.       Pt currently tolerates dys I solids (reported, as pt indicated she did not want to trial dys I solids today) and thin liquids (observed today). Oral motor exam was deferred due to masking policy/COVID-19 risk.  Because data states the risk for dysphagia during and after radiation treatment is high due to undergoing radiation tx, SLP taught pt about the possibility of reduced/limited ability for PO intake during rad tx.  SLP educated pt re: changes to swallowing musculature after rad tx, and why adherence to dysphagia HEP provided today and PO consumption was necessary to inhibit muscular fibrosis following rad tx. Pt demonstrated understanding of these things to SLP.    SLP then developed a HEP for pt and pt was instructed how to perform exercises involving lingual, vocal, and pharyngeal strengthening. SLP performed  each exercise and pt return demonstrated each exercise. SLP ensured pt performance was correct prior to moving on to next exercise. Pt was instructed to complete this program 2 times a day, until 6 months after her last rad tx, then x2 a week after that.                 SLP Education - 05/06/19 1118    Education Details  HEP procedure and rationale, late effects radiation on swallowing ability    Person(s) Educated  Patient    Methods  Explanation;Demonstration;Verbal cues    Comprehension  Verbalized understanding;Verbal cues required;Returned demonstration;Need further instruction       SLP Short Term Goals - 05/06/19 1233      SLP SHORT TERM GOAL #1   Title  pt will demo undestanding of correct procedure for HEP with occasional min A    Time  1    Period  --   visit, for all STGs    Status  New      SLP SHORT TERM GOAL #2   Title  pt will tell SLP benefits of food journal with modified independence    Time  1    Status  New      SLP SHORT TERM GOAL #3   Title  pt will tell SLP 3 overt s/sx aspiration PNA with modified independence    Time  1    Status  New       SLP Long Term Goals - 05/06/19 1234      SLP LONG TERM GOAL #1   Title  pt will demonstrate understanding of correct procedure for HEP with rare min A    Time  3    Period  --   visits (visit #4), for all LTGs   Status  New      SLP LONG TERM GOAL #2   Title  pt will tell how a food journal can assist return to WNL textures/liquids with modified independence over 2 sessions    Time  3    Status  New       Plan - 05/06/19 1223    Clinical Impression Statement  Pt preents today with swallowing WNL for liquids - pt refused solids today but denies overt s/sx aspiration with POs. Pt reports gravitating towards liquids. Pt was provided a HEP today    Speech Therapy Frequency  --   once approx every four weeks   Duration  --   3 therapy visits   Treatment/Interventions  Aspiration precaution training;Pharyngeal strengthening exercises;Internal/external aids;Patient/family education;SLP instruction and feedback;Diet toleration management by SLP    Potential to Achieve Goals  Good    Consulted and Agree with Plan of Care  Patient       Patient will benefit from skilled therapeutic intervention in order to improve the following deficits and impairments:   Dysphagia, unspecified type    Problem List Patient Active Problem List   Diagnosis Date Noted  . Anxiety 04/04/2019  . Hyponatremia 03/28/2019  . Dehydration 03/28/2019  . Cancer of base of tongue ([place]) 03/24/2019  . Oropharyngeal dysphagia 03/14/2019  . Dysphasia 01/27/2019  . Hypertension 12/13/2016  . S/P mastectomy, left 10/18/2016  . Breast cancer ([place]) 10/18/2016  . Port catheter in place 05/23/2016  . Breast cancer of  upper-outer quadrant of left female breast ([place]) 04/30/2016  . Conductive hearing loss in right ear 12/09/2015  . Enteritis 05/30/2013  . Prediabetes   . Macular degeneration, dry   .  Vertigo   . Vitamin D deficiency 11/17/2008  . COLONIC POLYPS, HX OF 11/17/2008  . Hypothyroidism 11/19/2007  . HYPERLIPIDEMIA 11/19/2007  . Osteopenia 02/18/2007    [facility] 05/06/2019, 12:45 PM  [facility] [address] [place], [place], [address] Phone: [phone]   Fax:  [phone]  Name: Jessica Sherman MRN: [id number] Date of Birth: Jul 20, 1931

## 2019-05-06 NOTE — Patient Instructions (Signed)
SWALLOWING EXERCISES Do these daily until 6 months after your last day of radiation, then 2 times per week afterwards  1. Effortful Swallows - Press your tongue against the roof of your mouth for 3 seconds, then squeeze          the muscles in your neck while you swallow your saliva or a sip of water - Repeat 20 times, 2-3 times a day, and use whenever you eat or drink  2. Masako Swallow - swallow with your tongue sticking out - Stick tongue out past your teeth and gently bite tongue with your teeth - Swallow, while holding your tongue with your teeth - Repeat 20 times, 2-3 times a day *use a wet spoon if your mouth gets dry*  3. Mendelsohn Maneuver - "half swallow" exercise - Start to swallow, and keep your Adam's apple up by squeezing hard with the            muscles of the throat - Hold the swallow tight for 5-7 seconds then relax - Repeat 20 times, 2-3 times a day *use a wet spoon if your mouth gets dry*  Chin pushback - Open your mouth  - Place your fist UNDER your chin near your neck, and push back with your fist for 5 seconds  - Repeat 10 times, 2-3 times a day   "Super Swallow"  - Take a breath and hold it  - bear down  (take sip)  - swallow and COUGH!

## 2019-05-07 ENCOUNTER — Other Ambulatory Visit: Payer: Self-pay

## 2019-05-07 ENCOUNTER — Ambulatory Visit
Admission: RE | Admit: 2019-05-07 | Discharge: 2019-05-07 | Disposition: A | Discharge status: Home / Self Care | Payer: PPO | Source: Ambulatory Visit | Attending: Radiation Oncology | Admitting: Radiation Oncology

## 2019-05-07 DIAGNOSIS — Z510 Encounter for antineoplastic radiation therapy: Secondary | ICD-10-CM | POA: Diagnosis not present

## 2019-05-07 DIAGNOSIS — C01 Malignant neoplasm of base of tongue: Secondary | ICD-10-CM | POA: Diagnosis not present

## 2019-05-08 ENCOUNTER — Inpatient Hospital Stay: Payer: PPO | Admitting: Nutrition

## 2019-05-08 ENCOUNTER — Telehealth: Payer: Self-pay | Admitting: Nutrition

## 2019-05-08 ENCOUNTER — Other Ambulatory Visit: Payer: Self-pay

## 2019-05-08 ENCOUNTER — Telehealth: Payer: Self-pay | Admitting: *Deleted

## 2019-05-08 ENCOUNTER — Ambulatory Visit
Admission: RE | Admit: 2019-05-08 | Discharge: 2019-05-08 | Disposition: A | Discharge status: Home / Self Care | Payer: PPO | Source: Ambulatory Visit | Attending: Radiation Oncology | Admitting: Radiation Oncology

## 2019-05-08 DIAGNOSIS — C01 Malignant neoplasm of base of tongue: Secondary | ICD-10-CM | POA: Diagnosis not present

## 2019-05-08 DIAGNOSIS — Z510 Encounter for antineoplastic radiation therapy: Secondary | ICD-10-CM | POA: Diagnosis not present

## 2019-05-08 NOTE — Progress Notes (Addendum)
I contacted both patient and daughter at their respective numbers.  There was no answer in either.  I was unable to leave a message for patient because she did not have voicemail.  I did leave a message with patient's daughter to return call.  Jessica Sherman returned my call. Patient is having nausea. Still drinking Ensure Enlive 3 times daily.  She is using half bottle Osmolite 1.5 via PEG instead of a full bottle because she says she gets too full.  Is not clear how many bottles of Osmolite 1.5 patient is tolerating.  Daughter reports weight is down and 104 pounds.  Nutrition diagnosis: Inadequate oral intake continues.  Intervention: Educated patient's daughter to communicate increasing Ensure Enlive 4 times daily and continuing Osmolite 1.5-2 bottles via PEG daily. Encouraged nausea medication as needed. Provided supportive listening.  Monitoring, evaluation, goals: Patient will tolerate adequate calories and protein to minimize weight loss.  Next visit: Wednesday, September 2 by phone.  **Disclaimer: This note was dictated with voice recognition software. Similar sounding words can inadvertently be transcribed and this note may contain transcription errors which may not have been corrected upon publication of note.**

## 2019-05-08 NOTE — Progress Notes (Signed)
error 

## 2019-05-08 NOTE — Telephone Encounter (Signed)
[facility] Work  Clinical Social Work received phone call from patient stating she was having difficulty eating and managing symptoms.  CSW explored patient's concerns regarding her anxiety, physical side effects,and dealing with situational depression. She stated "I need to get happy again", CSW encouraged patient to explore and elaborate on this statement..  CSW validated patient's feelings of fatigue, weakness, difficulty eating as typical side effects of treatment and suggested possible coping strategies.   Patient reported chronic anxiety and has taken anxiety medication for years, does not feel it is adequate to manage current situation.  She attempted to take an anti-depressant that "kept her up all night" (patient only consumed once).  CSW encouraged patient to discuss medication management with PCP to find a medication that is suitable- she plans to discuss next week at PCP appointment.   CSW recommended matching patient with a Head & Neck peer mentor to connect with someone who has experienced a similar treatment.  Patient was agreeable- CSW made referral to Gayleen Orem, navigator.  CSW recommended tele-therapy with [facility] counseling intern to process emotions and explore coping skills.  Patient was agreeable- CSW made referral to [facility], [facility] counseling intern.  CSW attempted to contact patient's daughter to give update at patient's request.  CSW left voicemail.    Gwinda Maine, LCSW  Clinical Social Worker [facility]

## 2019-05-09 ENCOUNTER — Other Ambulatory Visit: Payer: Self-pay | Admitting: Radiation Oncology

## 2019-05-09 ENCOUNTER — Ambulatory Visit
Admission: RE | Admit: 2019-05-09 | Discharge: 2019-05-09 | Disposition: A | Discharge status: Home / Self Care | Payer: PPO | Source: Ambulatory Visit | Attending: Radiation Oncology | Admitting: Radiation Oncology

## 2019-05-09 ENCOUNTER — Other Ambulatory Visit: Payer: Self-pay

## 2019-05-09 DIAGNOSIS — C01 Malignant neoplasm of base of tongue: Secondary | ICD-10-CM | POA: Diagnosis not present

## 2019-05-09 DIAGNOSIS — Z510 Encounter for antineoplastic radiation therapy: Secondary | ICD-10-CM | POA: Diagnosis not present

## 2019-05-09 MED ORDER — ONDANSETRON 4 MG PO TBDP: 4.0000 mg | ORAL_TABLET | Freq: Three times a day (TID) | ORAL | 3 refills | Status: DC | PRN

## 2019-05-09 NOTE — Telephone Encounter (Signed)
Pt declined at this time. Jessica Sherman is aware.

## 2019-05-10 NOTE — Progress Notes (Signed)
Met with Ms. Jessica Sherman after RT, escorted her to [facility], provided verbal overview.  She was seen by SLP Garald Balding.  Spoke with her at end of [facility], addressed questions. I encouraged her to call me with needs/concerns.  Gayleen Orem, RN, BSN Head & Neck Oncology [place] at [place] [phone]

## 2019-05-12 ENCOUNTER — Other Ambulatory Visit: Payer: Self-pay

## 2019-05-12 ENCOUNTER — Ambulatory Visit
Admission: RE | Admit: 2019-05-12 | Discharge: 2019-05-12 | Disposition: A | Discharge status: Home / Self Care | Payer: PPO | Source: Ambulatory Visit | Attending: Radiation Oncology | Admitting: Radiation Oncology

## 2019-05-12 DIAGNOSIS — C01 Malignant neoplasm of base of tongue: Secondary | ICD-10-CM | POA: Diagnosis not present

## 2019-05-12 DIAGNOSIS — Z510 Encounter for antineoplastic radiation therapy: Secondary | ICD-10-CM | POA: Diagnosis not present

## 2019-05-13 ENCOUNTER — Ambulatory Visit
Admission: RE | Admit: 2019-05-13 | Discharge: 2019-05-13 | Disposition: A | Discharge status: Home / Self Care | Payer: PPO | Source: Ambulatory Visit | Attending: Radiation Oncology | Admitting: Radiation Oncology

## 2019-05-13 ENCOUNTER — Other Ambulatory Visit: Payer: Self-pay

## 2019-05-13 DIAGNOSIS — C01 Malignant neoplasm of base of tongue: Secondary | ICD-10-CM | POA: Diagnosis not present

## 2019-05-13 DIAGNOSIS — Z510 Encounter for antineoplastic radiation therapy: Secondary | ICD-10-CM | POA: Diagnosis not present

## 2019-05-13 NOTE — Progress Notes (Signed)
I spoke to the patient over the telephone today for a check-in regarding reported anxiety and to make the patient aware of the available counseling services. Patient was referred by Gwinda Maine. Patient stated that although she has dealt with anxiety, she has coped with medication and self-talk coping strategies.  Also, she mentioned that she has natural supports and will be completing her treatments in late September.  The patient declined counseling services, but mentioned she is hoping to regain her sense of taste after treatment concludes. I will call in October to check-in regarding her post treatment adjustment.

## 2019-05-14 ENCOUNTER — Ambulatory Visit
Admission: RE | Admit: 2019-05-14 | Discharge: 2019-05-14 | Disposition: A | Discharge status: Home / Self Care | Payer: PPO | Source: Ambulatory Visit | Attending: Radiation Oncology | Admitting: Radiation Oncology

## 2019-05-14 ENCOUNTER — Other Ambulatory Visit: Payer: Self-pay

## 2019-05-14 ENCOUNTER — Inpatient Hospital Stay: Payer: PPO | Attending: Hematology and Oncology | Admitting: Nutrition

## 2019-05-14 ENCOUNTER — Telehealth: Payer: Self-pay | Admitting: Nutrition

## 2019-05-14 DIAGNOSIS — Z510 Encounter for antineoplastic radiation therapy: Secondary | ICD-10-CM | POA: Diagnosis not present

## 2019-05-14 DIAGNOSIS — C01 Malignant neoplasm of base of tongue: Secondary | ICD-10-CM | POA: Diagnosis not present

## 2019-05-14 NOTE — Progress Notes (Signed)
Nutrition follow up completed with patient and daughter over the phone. Patient is consuming 4 Ensure Enlive and 1 Osmolite 1.5 daily by mouth or feeding tube. Patient was very pleased to tell me that she was able to eat an egg this morning. I do not have patient's current weight. She is so appreciative of receiving complementary Ensure Enlive.  Estimated nutrition needs: 1440-1680 cal, 72-84 g protein, 1.6 L fluid.  Nutrition diagnosis: Inadequate oral intake appears improved.  Intervention: Educated to continue strategies for 5 bottles of nutrition supplement daily.  This provides approximately 1775 cal, 95 g protein. Encourage patient to continue to try a variety of different soft foods. Provided support and encouragement. Offered patient additional Ensure Enlive as needed.  Monitoring, evaluation, goals:  Patient will continue to consume adequate nutrition to minimize weight loss.  Next visit: Wednesday, September 9 with Jennet Maduro over the telephone.  **Disclaimer: This note was dictated with voice recognition software. Similar sounding words can inadvertently be transcribed and this note may contain transcription errors which may not have been corrected upon publication of note.**

## 2019-05-14 NOTE — Telephone Encounter (Signed)
error 

## 2019-05-15 ENCOUNTER — Other Ambulatory Visit: Payer: Self-pay

## 2019-05-15 ENCOUNTER — Ambulatory Visit
Admission: RE | Admit: 2019-05-15 | Discharge: 2019-05-15 | Disposition: A | Discharge status: Home / Self Care | Payer: PPO | Source: Ambulatory Visit | Attending: Radiation Oncology | Admitting: Radiation Oncology

## 2019-05-15 DIAGNOSIS — C01 Malignant neoplasm of base of tongue: Secondary | ICD-10-CM | POA: Diagnosis not present

## 2019-05-15 DIAGNOSIS — Z510 Encounter for antineoplastic radiation therapy: Secondary | ICD-10-CM | POA: Diagnosis not present

## 2019-05-16 ENCOUNTER — Ambulatory Visit
Admission: RE | Admit: 2019-05-16 | Discharge: 2019-05-16 | Disposition: A | Discharge status: Home / Self Care | Payer: PPO | Source: Ambulatory Visit | Attending: Radiation Oncology | Admitting: Radiation Oncology

## 2019-05-16 ENCOUNTER — Other Ambulatory Visit: Payer: Self-pay

## 2019-05-16 DIAGNOSIS — Z510 Encounter for antineoplastic radiation therapy: Secondary | ICD-10-CM | POA: Diagnosis not present

## 2019-05-16 DIAGNOSIS — C01 Malignant neoplasm of base of tongue: Secondary | ICD-10-CM | POA: Diagnosis not present

## 2019-05-20 ENCOUNTER — Ambulatory Visit: Payer: PPO | Admitting: Internal Medicine

## 2019-05-20 ENCOUNTER — Other Ambulatory Visit: Payer: Self-pay

## 2019-05-20 ENCOUNTER — Ambulatory Visit
Admission: RE | Admit: 2019-05-20 | Discharge: 2019-05-20 | Disposition: A | Discharge status: Home / Self Care | Payer: PPO | Source: Ambulatory Visit | Attending: Radiation Oncology | Admitting: Radiation Oncology

## 2019-05-20 DIAGNOSIS — Z510 Encounter for antineoplastic radiation therapy: Secondary | ICD-10-CM | POA: Diagnosis not present

## 2019-05-20 DIAGNOSIS — C01 Malignant neoplasm of base of tongue: Secondary | ICD-10-CM | POA: Diagnosis not present

## 2019-05-21 ENCOUNTER — Ambulatory Visit
Admission: RE | Admit: 2019-05-21 | Discharge: 2019-05-21 | Disposition: A | Discharge status: Home / Self Care | Payer: PPO | Source: Ambulatory Visit | Attending: Radiation Oncology | Admitting: Radiation Oncology

## 2019-05-21 ENCOUNTER — Inpatient Hospital Stay: Payer: PPO

## 2019-05-21 ENCOUNTER — Other Ambulatory Visit: Payer: Self-pay

## 2019-05-21 DIAGNOSIS — Z510 Encounter for antineoplastic radiation therapy: Secondary | ICD-10-CM | POA: Diagnosis not present

## 2019-05-21 DIAGNOSIS — C01 Malignant neoplasm of base of tongue: Secondary | ICD-10-CM | POA: Diagnosis not present

## 2019-05-21 NOTE — Progress Notes (Signed)
Nutrition Follow-up:  Patient with tongue cancer. Patient is receiving radiation.    Spoke with patient's daughter only this am.  Daughter had requested am appointment as works 11am to [employer].  Reports that she is off on Friday.  Daughter reports that patient is drinking 4 ensure enlive daily. She continues to infuse 1 carton of osmolite 1.5 via feeding tube at night.  Daughter reports that another family member helps her with this. Daughter reports that she is eating an egg daily.  Also eating yogurt, pudding, chicken noodle soup.  Daughter reports biggest challenge is food taste like cardboard.      Denies any other nutrition impact symtpoms  Medications: reviewed  Labs: no new  Anthropometrics:   Weight taken in radiation per Gayleen Orem, RN  9/8 104 lb 4 oz 8/31 104 lb 4 oz 8/24 104 lb 8/17 105 lb 2 oz 8/10 107 lb 2 oz 8/4 106 lb 8 oz   Estimated Energy Needs  Kcals: 1440-1680 Protein: 72-84 g Fluid: 1.6 L  NUTRITION DIAGNOSIS: Inadequate oral intake improved   INTERVENTION:  Educated daughter for patient to continue to drink 4 ensure enlive daily and infuse 1 osmolite 1.5 via tube daily.  Discussed with daughter that if over the next week patient is only able to drink 3 ensure then would encouraged to infuse 2 osmolite 1.5 via feeding tube.  Daughter verbalized understanding.  Encouraged continued oral intake as able for additional calories and protein.  Provided another case of ensure enlive and osmolite 1.5 to [place], Therapist, sports and will give to patient at radiation treatment. Daughter requesting follow-up appointment to be changed due to work schedule, off on Fridays, works 11am to [employer].      MONITORING, EVALUATION, GOAL: Patient will continue to consume adequate nutrition to minimize weight loss.    NEXT VISIT: Friday, Sept 18 phone f/u  Delois Tolbert B. Zenia Resides, [place], [place] Registered Dietitian [phone] (pager)

## 2019-05-22 ENCOUNTER — Other Ambulatory Visit: Payer: Self-pay

## 2019-05-22 ENCOUNTER — Ambulatory Visit
Admission: RE | Admit: 2019-05-22 | Discharge: 2019-05-22 | Disposition: A | Discharge status: Home / Self Care | Payer: PPO | Source: Ambulatory Visit | Attending: Radiation Oncology | Admitting: Radiation Oncology

## 2019-05-22 DIAGNOSIS — C01 Malignant neoplasm of base of tongue: Secondary | ICD-10-CM | POA: Diagnosis not present

## 2019-05-22 DIAGNOSIS — F32A Depression, unspecified: Secondary | ICD-10-CM | POA: Insufficient documentation

## 2019-05-22 DIAGNOSIS — F329 Major depressive disorder, single episode, unspecified: Secondary | ICD-10-CM | POA: Insufficient documentation

## 2019-05-22 DIAGNOSIS — Z510 Encounter for antineoplastic radiation therapy: Secondary | ICD-10-CM | POA: Diagnosis not present

## 2019-05-22 NOTE — Progress Notes (Signed)
Acute Office Visit  Subjective:    Patient ID: Jessica Sherman, female    DOB: [birth year], 85 y.o.   MRN: [id number]  Chief Complaint  Patient presents with   Hypertension   Depression   Hypothyroidism    HPI Patient is in today for follow-up.  Overall she is feeling well and has no specific complaints.  She was initially here to follow-up on depression.  Depression: She was started on citalopram by oncology for depression, but did not like the side effects and at this point does not feel that she needs medication.  She currently denies any depression.  Hypertension: She is taking her medication daily. She is compliant with a low sodium diet.  She denies chest pain, palpitations, edema, shortness of breath and regular headaches.   Anxiety: She is taking her medication daily as prescribed. She denies any side effects from the medication. She feels her anxiety is well controlled and she is happy with her current dose of medication.   Hyperlipidemia: She is taking her medication daily. She is compliant with a low fat/cholesterol diet.    Hypothyroidism:  She is taking her medication daily.  She denies any recent changes in energy or weight that are unexplained.   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  Overall she is feeling well.  She is significantly decreased taste, but states her appetite is good.  She is only able to eat certain soft foods.  She still has the feeding tube in place.  She has not lost weight and states she has actually gained 1 pound.    Past Medical History:  Diagnosis Date   Hx of colonic polyps    Hyperlipidemia    Hypothyroidism    Macular degeneration, dry    Osteopenia    spine BMD T score - 1.69   PONV (postoperative nausea and vomiting)    pt has vertigo and can not lie flat   Prediabetes    FBS 136  on 08/29/2010; A1c 6.2%   Vertigo     Past Surgical History:    Procedure Laterality Date   CATARACT EXTRACTION  2009   OS   COLONOSCOPY W/ POLYPECTOMY     X 2; last 2012. Dr Watt Climes   IR GASTROSTOMY TUBE MOD SED  04/17/2019   IR GENERIC HISTORICAL  05/09/2016   IR US GUIDE VASC ACCESS RIGHT 05/09/2016 Jacqulynn Cadet, MD [facility]   IR GENERIC HISTORICAL  05/09/2016   IR FLUORO GUIDE CV LINE RIGHT 05/09/2016 Jacqulynn Cadet, MD [facility]   MODIFIED MASTECTOMY Left 10/18/2016   Procedure: LEFT MODIFIED RADICAL MASTECTOMY;  Surgeon: [name], MD;  Location: [facility];  Service: General;  Laterality: Left;   PILONIDAL CYST EXCISION     X 3   TONSILLECTOMY AND ADENOIDECTOMY     TOTAL ABDOMINAL HYSTERECTOMY     w/ bladder repair    Family History  Problem Relation Age of Onset   Osteoporosis Mother    Leukemia Mother    Cancer Maternal Uncle        ? stomach   Aneurysm Father        AAA   Heart attack Neg Hx     Social History   Socioeconomic History   Marital status: Widowed    Spouse name: Not on file   Number of children: 4   Years of education: Not on file  Highest education level: Not on file  Occupational History   Occupation: retired  Scientist, product/process development strain: Not on file   Food insecurity    Worry: Not on file    Inability: Not on Lexicographer needs    Medical: No    Non-medical: No  Tobacco Use   Smoking status: Never Smoker   Smokeless tobacco: Never Used  Substance and Sexual Activity   Alcohol use: No    Comment:     Drug use: No   Sexual activity: Not on file  Lifestyle   Physical activity    Days per week: Not on file    Minutes per session: Not on file   Stress: Not on file  Relationships   Social connections    Talks on phone: Not on file    Gets together: Not on file    Attends religious service: Not on file    Active member of club or organization: Not on file    Attends meetings of clubs or organizations: Not on file     Relationship status: Not on file   Intimate partner violence    Fear of current or ex partner: Not on file    Emotionally abused: Not on file    Physically abused: Not on file    Forced sexual activity: Not on file  Other Topics Concern   Not on file  Social History Narrative   Regular exercise- yes 72md w/o symptoms    Outpatient Medications Prior to Visit  Medication Sig Dispense Refill   amLODipine (NORVASC) 5 MG tablet Take 1 tablet (5 mg total) by mouth daily. (Patient taking differently: Take 5 mg by mouth at bedtime. ) 90 tablet 3   Calcium Carb-Cholecalciferol (CALCIUM 600+D3 PO) Take 1 tablet by mouth 2 (two) times daily.     Cholecalciferol (VITAMIN D3) 1000 UNITS CAPS Take 1,000 Units by mouth daily.      fluconazole (DIFLUCAN) 100 MG tablet Take 2 tablets today, then 1 tablet daily x 20 more days. Hold simvastatin while on this. 22 tablet 0   levothyroxine (SYNTHROID) 88 MCG tablet TAKE 1 TABLET BY MOUTH EVERY DAY EXCEPT ON WEDNESDAY TAKE 1/2 TABLET (Patient taking differently: Take 88 mcg by mouth See admin instructions. TAKE 1 TABLET BY MOUTH EVERY DAY EXCEPT ON WEDNESDAY TAKE 1/2 TABLET) 90 tablet 1   lidocaine (XYLOCAINE) 2 % solution Patient: Mix 1part 2% viscous lidocaine, 1part H20. Swish & swallow 155mof diluted mixture, 3048mbefore meals and at bedtime, up to QID for soreness 100 mL 5   LORazepam (ATIVAN) 0.5 MG tablet Take 0.5 tablets (0.25 mg total) by mouth 2 (two) times daily as needed for anxiety. 30 tablet 0   Multiple Vitamins-Minerals (PRESERVISION AREDS PO) Take by mouth.     ondansetron (ZOFRAN ODT) 4 MG disintegrating tablet Take 1 tablet (4 mg total) by mouth every 8 (eight) hours as needed for nausea or vomiting. 30 tablet 3   simvastatin (ZOCOR) 20 MG tablet Take 1 tablet (20 mg total) by mouth at bedtime. 90 tablet 1   sodium fluoride (PREVIDENT 5000 PLUS) 1.1 % CREA dental cream Place gel into fluoride tray at bedtime.  Place over teeth  for 5 minutes.  Remove.  Spit out excess.  Repeat nightly. 1 Tube prn   citalopram (CELEXA) 10 MG tablet Take 1 tablet (10 mg total) by mouth daily. 30 tablet 0   No facility-administered medications prior to visit.  Allergies  Allergen Reactions   Other Nausea And Vomiting    Anesthesia "makes me very sick", Please pre-medicate to control nausea and vomiting    Review of Systems  Constitutional: Negative for fever.       Appetite is good.  Decreased taste  HENT:       Decreased taste, excessive phlegm in throat  Respiratory: Negative for cough, shortness of breath and wheezing.   Cardiovascular: Negative for chest pain, palpitations and leg swelling.  Gastrointestinal: Positive for nausea (sometimes). Negative for abdominal pain, constipation, diarrhea and vomiting.  Neurological: Negative for headaches.  Psychiatric/Behavioral: The patient does not have insomnia.        Objective:    Physical Exam  Constitutional: She is oriented to person, place, and time. No distress.  Thin, fragile elderly female  HENT:  Head: Normocephalic and atraumatic.  Eyes: Conjunctivae are normal.  Neck: No tracheal deviation present. No thyromegaly present.  Cardiovascular: Normal rate and regular rhythm.  Pulmonary/Chest: Effort normal. No respiratory distress. She has no wheezes. She has no rales.  Abdominal: Soft. She exhibits no distension. There is no abdominal tenderness.  Musculoskeletal:        General: No edema.  Lymphadenopathy:    She has no cervical adenopathy.  Neurological: She is alert and oriented to person, place, and time.  Skin: Skin is warm and dry. She is not diaphoretic.  Psychiatric: She has a normal mood and affect. Her behavior is normal.    BP (!) 144/62 (BP Location: Right Arm, Patient Position: Sitting, Cuff Size: Normal)    Pulse 62    Temp 98.1 F (36.7 C) (Oral)    Resp 16    Ht '5\' 3"'  (1.6 m)    Wt 105 lb (47.6 kg)    SpO2 98%    BMI 18.60 kg/m  Wt  Readings from Last 3 Encounters:  05/23/19 105 lb (47.6 kg)  03/29/19 106 lb 1.6 oz (48.1 kg)  03/28/19 106 lb 12.8 oz (48.4 kg)    Health Maintenance Due  Topic Date Due   DEXA SCAN  04/18/2019    There are no preventive care reminders to display for this patient.   Lab Results  Component Value Date   TSH 1.281 03/29/2019   Lab Results  Component Value Date   WBC 11.3 (H) 04/17/2019   HGB 12.1 04/17/2019   HCT 33.2 (L) 04/17/2019   MCV 96.0 04/17/2019   PLT 318 04/17/2019   Lab Results  Component Value Date   NA 123 (L) 04/17/2019   K 4.2 04/17/2019   CHLORIDE 103 05/01/2017   CO2 27 04/17/2019   GLUCOSE 109 (H) 04/17/2019   BUN 10 04/17/2019   CREATININE 0.64 04/17/2019   BILITOT 0.8 03/29/2019   ALKPHOS 65 03/29/2019   AST 18 03/29/2019   ALT 15 03/29/2019   PROT 7.6 03/29/2019   ALBUMIN 3.6 03/29/2019   CALCIUM 10.2 04/17/2019   ANIONGAP 12 04/17/2019   EGFR 61 (L) 05/01/2017   GFR 66.04 01/27/2019   Lab Results  Component Value Date   CHOL 135 01/27/2019   Lab Results  Component Value Date   HDL 51.00 01/27/2019   Lab Results  Component Value Date   LDLCALC 72 01/04/2018   Lab Results  Component Value Date   TRIG 204.0 (H) 01/27/2019   Lab Results  Component Value Date   CHOLHDL 3 01/27/2019   Lab Results  Component Value Date   HGBA1C 6.2 01/27/2019  Assessment & Plan:   Problem List Items Addressed This Visit    Cancer of base of tongue ([place])   Depression   HYPERLIPIDEMIA   Hypertension   Hypothyroidism    Other Visit Diagnoses    Need for influenza vaccination    -  Primary   Relevant Orders   Flu Vaccine QUAD High Dose(Fluad) (Completed)       No orders of the defined types were placed in this encounter.    [name], MD

## 2019-05-23 ENCOUNTER — Ambulatory Visit
Admission: RE | Admit: 2019-05-23 | Discharge: 2019-05-23 | Disposition: A | Discharge status: Home / Self Care | Payer: PPO | Source: Ambulatory Visit | Attending: Radiation Oncology | Admitting: Radiation Oncology

## 2019-05-23 ENCOUNTER — Encounter: Payer: Self-pay | Admitting: Internal Medicine

## 2019-05-23 ENCOUNTER — Ambulatory Visit (INDEPENDENT_AMBULATORY_CARE_PROVIDER_SITE_OTHER): Payer: PPO | Admitting: Internal Medicine

## 2019-05-23 ENCOUNTER — Other Ambulatory Visit: Payer: Self-pay

## 2019-05-23 VITALS — BP 144/62 | HR 62 | Temp 98.1°F | Resp 16 | Ht 63.0 in | Wt 105.0 lb

## 2019-05-23 DIAGNOSIS — R7303 Prediabetes: Secondary | ICD-10-CM

## 2019-05-23 DIAGNOSIS — Z23 Encounter for immunization: Secondary | ICD-10-CM | POA: Diagnosis not present

## 2019-05-23 DIAGNOSIS — E039 Hypothyroidism, unspecified: Secondary | ICD-10-CM | POA: Diagnosis not present

## 2019-05-23 DIAGNOSIS — F419 Anxiety disorder, unspecified: Secondary | ICD-10-CM | POA: Diagnosis not present

## 2019-05-23 DIAGNOSIS — I10 Essential (primary) hypertension: Secondary | ICD-10-CM | POA: Diagnosis not present

## 2019-05-23 DIAGNOSIS — Z510 Encounter for antineoplastic radiation therapy: Secondary | ICD-10-CM | POA: Diagnosis not present

## 2019-05-23 DIAGNOSIS — C01 Malignant neoplasm of base of tongue: Secondary | ICD-10-CM

## 2019-05-23 DIAGNOSIS — E782 Mixed hyperlipidemia: Secondary | ICD-10-CM | POA: Diagnosis not present

## 2019-05-23 DIAGNOSIS — F329 Major depressive disorder, single episode, unspecified: Secondary | ICD-10-CM | POA: Diagnosis not present

## 2019-05-23 NOTE — Patient Instructions (Addendum)
  Flu immunization administered today.    Medications reviewed and updated.  Changes include :   none   Please followup in 7 months

## 2019-05-24 NOTE — Assessment & Plan Note (Signed)
Lorazepam twice daily as needed Continue

## 2019-05-24 NOTE — Assessment & Plan Note (Signed)
Continue simvastatin. 

## 2019-05-24 NOTE — Assessment & Plan Note (Signed)
Recent TSH normal range Continue medication

## 2019-05-24 NOTE — Assessment & Plan Note (Signed)
Not taking citalopram Currently denies depression Will monitor off medication

## 2019-05-24 NOTE — Assessment & Plan Note (Signed)
Lab Results  Component Value Date   HGBA1C 6.2 01/27/2019   We will monitor, but concerned about her sugar Discussed her diet and she should eat whatever she can eat

## 2019-05-24 NOTE — Assessment & Plan Note (Signed)
BP Readings from Last 3 Encounters:  05/23/19 (!) 144/62  04/17/19 (!) 158/92  04/08/19 (!) 142/55   Blood pressure adequately controlled for her We will continue current medication at current doses

## 2019-05-24 NOTE — Assessment & Plan Note (Signed)
Following with oncology and radiation Has PEG tube To complete radiation in just over a week

## 2019-05-26 ENCOUNTER — Other Ambulatory Visit: Payer: Self-pay

## 2019-05-26 ENCOUNTER — Ambulatory Visit
Admission: RE | Admit: 2019-05-26 | Discharge: 2019-05-26 | Disposition: A | Discharge status: Home / Self Care | Payer: PPO | Source: Ambulatory Visit | Attending: Radiation Oncology | Admitting: Radiation Oncology

## 2019-05-26 DIAGNOSIS — Z510 Encounter for antineoplastic radiation therapy: Secondary | ICD-10-CM | POA: Diagnosis not present

## 2019-05-26 DIAGNOSIS — C01 Malignant neoplasm of base of tongue: Secondary | ICD-10-CM | POA: Diagnosis not present

## 2019-05-27 ENCOUNTER — Ambulatory Visit
Admission: RE | Admit: 2019-05-27 | Discharge: 2019-05-27 | Disposition: A | Discharge status: Home / Self Care | Payer: PPO | Source: Ambulatory Visit | Attending: Radiation Oncology | Admitting: Radiation Oncology

## 2019-05-27 ENCOUNTER — Other Ambulatory Visit: Payer: Self-pay

## 2019-05-27 DIAGNOSIS — Z510 Encounter for antineoplastic radiation therapy: Secondary | ICD-10-CM | POA: Diagnosis not present

## 2019-05-27 DIAGNOSIS — C01 Malignant neoplasm of base of tongue: Secondary | ICD-10-CM | POA: Diagnosis not present

## 2019-05-28 ENCOUNTER — Other Ambulatory Visit: Payer: Self-pay

## 2019-05-28 ENCOUNTER — Encounter: Payer: PPO | Admitting: Nutrition

## 2019-05-28 ENCOUNTER — Ambulatory Visit
Admission: RE | Admit: 2019-05-28 | Discharge: 2019-05-28 | Disposition: A | Discharge status: Home / Self Care | Payer: PPO | Source: Ambulatory Visit | Attending: Radiation Oncology | Admitting: Radiation Oncology

## 2019-05-28 DIAGNOSIS — C01 Malignant neoplasm of base of tongue: Secondary | ICD-10-CM | POA: Diagnosis not present

## 2019-05-28 DIAGNOSIS — Z510 Encounter for antineoplastic radiation therapy: Secondary | ICD-10-CM | POA: Diagnosis not present

## 2019-05-29 ENCOUNTER — Ambulatory Visit
Admission: RE | Admit: 2019-05-29 | Discharge: 2019-05-29 | Disposition: A | Discharge status: Home / Self Care | Payer: PPO | Source: Ambulatory Visit | Attending: Radiation Oncology | Admitting: Radiation Oncology

## 2019-05-29 ENCOUNTER — Other Ambulatory Visit: Payer: Self-pay

## 2019-05-29 DIAGNOSIS — C01 Malignant neoplasm of base of tongue: Secondary | ICD-10-CM | POA: Diagnosis not present

## 2019-05-29 DIAGNOSIS — Z510 Encounter for antineoplastic radiation therapy: Secondary | ICD-10-CM | POA: Diagnosis not present

## 2019-05-30 ENCOUNTER — Other Ambulatory Visit: Payer: Self-pay

## 2019-05-30 ENCOUNTER — Ambulatory Visit
Admission: RE | Admit: 2019-05-30 | Discharge: 2019-05-30 | Disposition: A | Discharge status: Home / Self Care | Payer: PPO | Source: Ambulatory Visit | Attending: Radiation Oncology | Admitting: Radiation Oncology

## 2019-05-30 ENCOUNTER — Telehealth: Payer: Self-pay | Admitting: *Deleted

## 2019-05-30 ENCOUNTER — Inpatient Hospital Stay: Payer: PPO | Admitting: Nutrition

## 2019-05-30 DIAGNOSIS — C01 Malignant neoplasm of base of tongue: Secondary | ICD-10-CM | POA: Diagnosis not present

## 2019-05-30 DIAGNOSIS — Z510 Encounter for antineoplastic radiation therapy: Secondary | ICD-10-CM | POA: Diagnosis not present

## 2019-05-30 NOTE — Progress Notes (Signed)
Nutrition follow-up completed with patient's daughter and patient over the telephone.  Patient is excited that she is almost done with her treatment. Last weight documented was 105 pounds on September 11 which is stable. Patient reports she still continues to eat 1 egg daily.  She is consuming chicken noodle soup without difficulty. She drinks 3 Ensure Enlive and uses 1 Osmolite 1.5 via her feeding tube. No questions or concerns.  Nutrition diagnosis: Inadequate oral intake is improving.  Intervention: Educated patient and daughter on the importance of consuming adequate calories and protein to minimize further weight loss. Patient is to continue Ensure Enlive and Osmolite 1.5 as well as increasing foods by mouth. Will provide one case of Ensure Enlive for patient to pick up on Monday morning. Patient has my contact information for questions.  Monitoring, evaluation, goals: Patient will tolerate adequate calories and protein for healing.  Next visit: Friday, October 9 over the telephone.  **Disclaimer: This note was dictated with voice recognition software. Similar sounding words can inadvertently be transcribed and this note may contain transcription errors which may not have been corrected upon publication of note.**

## 2019-05-30 NOTE — Telephone Encounter (Signed)
Oncology Nurse Navigator Documentation  LVMM for pt (and dtr) indicating she will receive a call this evening or tomorrow from patient mentor.  Gayleen Orem, RN, BSN Head & Neck Oncology Nurse [place] at [place] [phone]

## 2019-06-02 ENCOUNTER — Ambulatory Visit
Admission: RE | Admit: 2019-06-02 | Discharge: 2019-06-02 | Disposition: A | Discharge status: Home / Self Care | Payer: PPO | Source: Ambulatory Visit | Attending: Radiation Oncology | Admitting: Radiation Oncology

## 2019-06-02 ENCOUNTER — Encounter: Payer: Self-pay | Admitting: *Deleted

## 2019-06-02 ENCOUNTER — Encounter: Payer: Self-pay | Admitting: Radiation Oncology

## 2019-06-02 ENCOUNTER — Other Ambulatory Visit: Payer: Self-pay

## 2019-06-02 DIAGNOSIS — Z510 Encounter for antineoplastic radiation therapy: Secondary | ICD-10-CM | POA: Diagnosis not present

## 2019-06-02 DIAGNOSIS — C01 Malignant neoplasm of base of tongue: Secondary | ICD-10-CM

## 2019-06-02 MED ORDER — SONAFINE EX EMUL
1.0000 "application " | Freq: Once | CUTANEOUS | Status: AC
  Administered 2019-06-02: 1 via TOPICAL

## 2019-06-03 ENCOUNTER — Ambulatory Visit: Payer: PPO

## 2019-06-03 NOTE — Progress Notes (Signed)
Oncology Nurse Navigator Documentation  Met with Ms. Jessica Sherman during final RT to offer support and to celebrate end of radiation treatment.   Provided verbal/written post-RT guidance:  Importance of keeping all follow-up appts, especially those with Nutrition and SLP.  Importance of protecting treatment area from sun.  Continuation of Sonafine application 2-3 times daily, application of abx ointment to areas of raw skin; when supply of Sonafine exhausted transition to OTC lotion with vitamin E. Provided/reviewed Epic calendar of upcoming appts. Explained my role as navigator will continue for several more months, I will be calling or joining her during follow-up visits.   I encouraged her to call me with needs/concerns.   She noted she had received call from patient mentor.  Gayleen Orem, RN, BSN Head & Neck Oncology [place] at [place] [phone]

## 2019-06-04 ENCOUNTER — Ambulatory Visit: Payer: PPO

## 2019-06-05 ENCOUNTER — Ambulatory Visit: Payer: PPO

## 2019-06-13 ENCOUNTER — Ambulatory Visit: Payer: PPO

## 2019-06-17 NOTE — Progress Notes (Signed)
Radiation Oncology         [phone] ________________________________  Name: Jessica Sherman MRN: [id number]  Date: 06/18/2019  DOB: 31-Dec-1930  Follow-Up Visit Note by phone due to pandemic precautions, patient unable to use WebEx  CC: Burns, Claudina Lick, MD  [name], MD  Diagnosis and Prior Radiotherapy:       ICD-10-CM   1. Cancer of base of tongue ([place])  C01     04/14/2019 through 06/02/2019 Site Technique Total Dose Dose per Fx Completed Fx Beam Energies  Head & neck: HN_BOT IMRT 70/70 2 35/35 6X   CHIEF COMPLAINT:  Here for follow-up and surveillance of base of tongue cancer  Narrative:  The patient returns today for routine follow-up.  I spoke with her by phone. She feels "great". Eating regular food.  Shrimp, rice, potato, egg.  Taste is not back yet. Still has mucus in throat.  Gargling with baking soda rinses.  Using fluoride treatments.  Energy is great.  Pain issues, if any: no  Using a feeding tube?: She has one and is instilling Osmolite  Weight changes, if any:  She is not checking her weight at home.   Wt Readings from Last 3 Encounters:  05/23/19 105 lb (47.6 kg)  03/29/19 106 lb 1.6 oz (48.1 kg)  03/28/19 106 lb 12.8 oz (48.4 kg)   Swallowing issues, if any: doing well  Smoking or chewing tobacco? No  Last ENT visit was on: Not since diagnosis  Skin healed.  ALLERGIES:  is allergic to other.  Meds: Current Outpatient Medications  Medication Sig Dispense Refill  . amLODipine (NORVASC) 5 MG tablet Take 1 tablet (5 mg total) by mouth daily. (Patient taking differently: Take 5 mg by mouth at bedtime. ) 90 tablet 3  . Calcium Carb-Cholecalciferol (CALCIUM 600+D3 PO) Take 1 tablet by mouth 2 (two) times daily.    . Cholecalciferol (VITAMIN D3) 1000 UNITS CAPS Take 1,000 Units by mouth daily.     . fluconazole (DIFLUCAN) 100 MG tablet Take 2 tablets today, then 1 tablet daily x 20 more days. Hold simvastatin while on this. 22 tablet 0  . levothyroxine  (SYNTHROID) 88 MCG tablet TAKE 1 TABLET BY MOUTH EVERY DAY EXCEPT ON WEDNESDAY TAKE 1/2 TABLET (Patient taking differently: Take 88 mcg by mouth See admin instructions. TAKE 1 TABLET BY MOUTH EVERY DAY EXCEPT ON WEDNESDAY TAKE 1/2 TABLET) 90 tablet 1  . lidocaine (XYLOCAINE) 2 % solution Patient: Mix 1part 2% viscous lidocaine, 1part H20. Swish & swallow 13mL of diluted mixture, 59min before meals and at bedtime, up to QID for soreness 100 mL 5  . LORazepam (ATIVAN) 0.5 MG tablet Take 0.5 tablets (0.25 mg total) by mouth 2 (two) times daily as needed for anxiety. 30 tablet 0  . Multiple Vitamins-Minerals (PRESERVISION AREDS PO) Take by mouth.    . ondansetron (ZOFRAN ODT) 4 MG disintegrating tablet Take 1 tablet (4 mg total) by mouth every 8 (eight) hours as needed for nausea or vomiting. 30 tablet 3  . simvastatin (ZOCOR) 20 MG tablet Take 1 tablet (20 mg total) by mouth at bedtime. 90 tablet 1  . sodium fluoride (PREVIDENT 5000 PLUS) 1.1 % CREA dental cream Place gel into fluoride tray at bedtime.  Place over teeth for 5 minutes.  Remove.  Spit out excess.  Repeat nightly. 1 Tube prn   No current facility-administered medications for this encounter.     Physical Findings: The patient is in no acute distress. Patient  is alert and oriented. Wt Readings from Last 3 Encounters:  05/23/19 105 lb (47.6 kg)  03/29/19 106 lb 1.6 oz (48.1 kg)  03/28/19 106 lb 12.8 oz (48.4 kg)    vitals were not taken for this visit. .  General: Alert and oriented, in no acute distress  HEENT: voice sounds normal, improved from diagnosis  Lab Findings: Lab Results  Component Value Date   WBC 11.3 (H) 04/17/2019   HGB 12.1 04/17/2019   HCT 33.2 (L) 04/17/2019   MCV 96.0 04/17/2019   PLT 318 04/17/2019    Lab Results  Component Value Date   TSH 1.281 03/29/2019    Radiographic Findings: No results found.  Impression/Plan:    1) Head and Neck Cancer Status: healing from RT  2) Nutritional Status:  eating well, advised to check weight daily PEG tube: supplementing with this  3) Swallowing: SLP will follow  4) Dental: Encouraged to continue regular followup with dentistry, and dental hygiene including fluoride rinses. Compliant.  5) skin: healing well.  6) Thyroid function:  Check annually. Energy is great. Lab Results  Component Value Date   TSH 1.281 03/29/2019    7) Other: Follow-up in 3 months with restaging PET. The patient was encouraged to call with any issues or questions before then.  This encounter was provided by telemedicine platform phone as patient couldn't access Webex.  The patient has given verbal consent for this type of encounter and has been advised to only accept a meeting of this type in a secure network environment. The time spent during this encounter was ovee 15 minutes. The attendants for this meeting include [name]  and [name].  During the encounter, [name] was located at [facility] Radiation Oncology Department.  [name] was located at home.  _____________________________________   [name], MD  This document serves as a record of services personally performed by [name], MD. It was created on her behalf by Wilburn Mylar, a trained medical scribe. The creation of this record is based on the scribe's personal observations and the provider's statements to them. This document has been checked and approved by the attending provider.

## 2019-06-17 NOTE — Progress Notes (Signed)
  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 She had PEG tube placed during her second week of treatment. She reported some dry mouth, increased mucous, fatigue and taste changes. She denied difficulty swallowing, shortness or breath, and pain.    Plan: The patient will follow-up with radiation oncology in 2 weeks .  ________________________________________________   [name], MD  This document serves as a record of services personally performed by [name], MD. It was created on her behalf by Wilburn Mylar, a trained medical scribe. The creation of this record is based on the scribe's personal observations and the provider's statements to them. This document has been checked and approved by the attending provider.

## 2019-06-18 ENCOUNTER — Ambulatory Visit
Admission: RE | Admit: 2019-06-18 | Discharge: 2019-06-18 | Disposition: A | Discharge status: Home / Self Care | Payer: PPO | Source: Ambulatory Visit | Attending: Radiation Oncology | Admitting: Radiation Oncology

## 2019-06-18 ENCOUNTER — Encounter: Payer: Self-pay | Admitting: Radiation Oncology

## 2019-06-18 DIAGNOSIS — C01 Malignant neoplasm of base of tongue: Secondary | ICD-10-CM

## 2019-06-20 ENCOUNTER — Inpatient Hospital Stay: Payer: PPO | Attending: Hematology and Oncology | Admitting: Nutrition

## 2019-06-20 NOTE — Progress Notes (Signed)
Nutrition follow-up completed with patient over the telephone. Patient is status post treatment for base of tongue cancer. Patient reports she has lost weight.  Reports weight to be around 100 pounds. She has been increasing her oral intake and is tolerating foods such as macaroni and cheese, baked beans, scrambled eggs with bacon, ice cream and soup. She is no longer drinking Ensure Enlive because it makes her gag. She is using 1 bottle Osmolite 1.5 via PEG every day. She denies issues with tolerance.  Nutrition diagnosis: Inadequate oral intake is improving.  Intervention: I educated patient on continuing soft moist foods with plenty of calories and protein. Recommended patient increase Osmolite 1.5 via feeding tube if she notes weight continues to decrease.  Recommended patient weigh on home scale every several days. Provided 1 complementary case of Osmolite 1.5.  Monitoring, evaluation, goals: Patient will tolerate adequate calories and protein to minimize further weight loss.  Next visit: Friday, November 13 by telephone.  **Disclaimer: This note was dictated with voice recognition software. Similar sounding words can inadvertently be transcribed and this note may contain transcription errors which may not have been corrected upon publication of note.**

## 2019-06-24 ENCOUNTER — Telehealth: Payer: Self-pay | Admitting: *Deleted

## 2019-06-24 NOTE — Telephone Encounter (Signed)
Oncology Nurse Navigator Documentation  LVMM for pt and dtr re 10/15 appts, requested call-back.  Gayleen Orem, RN, BSN Head & Neck Oncology Nurse [place] at [place] [phone]

## 2019-06-26 ENCOUNTER — Other Ambulatory Visit (HOSPITAL_COMMUNITY): Payer: PPO | Admitting: Dentistry

## 2019-06-26 ENCOUNTER — Other Ambulatory Visit: Payer: Self-pay

## 2019-06-26 ENCOUNTER — Ambulatory Visit: Payer: PPO | Attending: Radiation Oncology

## 2019-06-26 DIAGNOSIS — R1310 Dysphagia, unspecified: Secondary | ICD-10-CM | POA: Diagnosis not present

## 2019-06-26 NOTE — Therapy (Signed)
[facility] [address] [place], [place], [address] Phone: [phone]   Fax:  [phone]  Speech Language Pathology Treatment  Patient Details  Name: Jessica Sherman MRN: [id number] Date of Birth: 1931/07/04 Referring Provider (SLP): [name], MD   Encounter Date: 06/26/2019  End of Session - 06/26/19 1439    Visit Number  2    Number of Visits  3    Date for SLP Re-Evaluation  08/04/19    SLP Start Time  6962    SLP Stop Time   1400    SLP Time Calculation (min)  37 min    Activity Tolerance  Patient tolerated treatment well       Past Medical History:  Diagnosis Date  . Hx of colonic polyps   . Hyperlipidemia   . Hypothyroidism   . Macular degeneration, dry   . Osteopenia    spine BMD T score - 1.69  . PONV (postoperative nausea and vomiting)    pt has vertigo and can not lie flat  . Prediabetes    FBS 136  on 08/29/2010; A1c 6.2%  . Vertigo     Past Surgical History:  Procedure Laterality Date  . CATARACT EXTRACTION  2009   OS  . COLONOSCOPY W/ POLYPECTOMY     X 2; last 2012. Dr Watt Climes  . IR GASTROSTOMY TUBE MOD SED  04/17/2019  . IR GENERIC HISTORICAL  05/09/2016   IR US GUIDE VASC ACCESS RIGHT 05/09/2016 Jacqulynn Cadet, MD [facility]  . IR GENERIC HISTORICAL  05/09/2016   IR FLUORO GUIDE CV LINE RIGHT 05/09/2016 Jacqulynn Cadet, MD [facility]  . MODIFIED MASTECTOMY Left 10/18/2016   Procedure: LEFT MODIFIED RADICAL MASTECTOMY;  Surgeon: [name], MD;  Location: [facility];  Service: General;  Laterality: Left;  . PILONIDAL CYST EXCISION     X 3  . TONSILLECTOMY AND ADENOIDECTOMY    . TOTAL ABDOMINAL HYSTERECTOMY     w/ bladder repair    There were no vitals filed for this visit.  Subjective Assessment - 06/26/19 1322    Subjective  "I had a salad, and I had a breadstick yesterday. I didn't have the bun, but I had a hotdog."    Currently in Pain?   No/denies            ADULT SLP TREATMENT - 06/26/19 1324      General Information   Behavior/Cognition  Alert;Cooperative;Pleasant mood      Treatment Provided   Treatment provided  Dysphagia      Dysphagia Treatment   Temperature Spikes Noted  No    Respiratory Status  Room air    Patient observed directly with PO's  Yes    Type of PO's observed  Dysphagia 3 (soft);Thin liquids    Oral Phase Signs & Symptoms  Other (comment)   none noted today   Pharyngeal Phase Signs & Symptoms  Immediate throat clear;Wet vocal quality;Delayed throat clear   however with smaller sip size throat clearing eliminated   Other treatment/comments  Pt acknowledges doing HEP x2-3/week - SLP req'd to provide cues re: rationale for exercises. Pt required consistent mod-max cues for HEP - SLP ?s if pt has been completing HEP. Pt still having difficulty SLP reiterated need to complete HEP x6 days/week.  Throat clear with liquids was eliminated with smaller sips; SLP told pt to use smaller sips to ensure decr'd aspiration risk. Wet voice (delayed) likely due to saliva  spillage to pharynx. Pt wiht spontanous throat clear which cleared the wet voice.       Dysphagia Recommendations   Diet recommendations  --   as tolerated   Liquids provided via  Cup;Straw    Medication Administration  --   as tolerated   Compensations  Small sips/bites      Progression Toward Goals   Progression toward goals  Not progressing toward goals (comment)   ? compliance with HEP      SLP Education - 06/26/19 1439    Education Details  rationale for HEP, late effects radiation on swallowing, HEP procedure    Person(s) Educated  Patient    Methods  Explanation;Demonstration;Tactile cues;Verbal cues    Comprehension  Verbalized understanding;Returned demonstration;Verbal cues required;Tactile cues required;Need further instruction       SLP Short Term Goals - 06/26/19 1442      SLP SHORT TERM GOAL #1   Title  pt will  demo undestanding of correct procedure for HEP with occasional min A    Period  --   visit, for all STGs   Status  Not Met      SLP SHORT TERM GOAL #2   Title  pt will tell SLP benefits of food journal with modified independence    Status  Deferred   added to [place] #3   Title  pt will tell SLP 3 overt s/sx aspiration PNA with modified independence    Status  Deferred   added to [place] - 06/26/19 1442      SLP LONG TERM GOAL #1   Title  pt will demonstrate understanding of correct procedure for HEP with rare min A    Time  2    Period  --   visits (visit #4), for all LTGs   Status  On-going      SLP LONG TERM GOAL #2   Title  pt will tell how a food journal can assist return to WNL textures/liquids with modified independence over 2 sessions    Time  2    Status  On-going      SLP LONG TERM GOAL #3   Title  pt to tell SLP 3 overt s/s aspiration PNA    Time  2    Status  New      SLP LONG TERM GOAL #4   Title  pt will adhere to small sips with liquids in ST session  to ensure pulmonary health/decr risk aspiration    Time  2    Status  New       Plan - 06/26/19 1440    Clinical Impression Statement  Pt presents today with swallowing WFL for dys III and thin liquids when using smaller sips (pt with throat clearing consistently when not using small sips strategy). Pt denies overt s/sx aspiration PNA and none observed. SLP ?s pt's completion of HEP due to pt requiring to read HEP and then requiring mod -max A following her reading of the procedure for each exercise. SLP reiterated rationale for HEP to pt. Pt would cont to benefit from skilled ST to assess safety with current POs as well as to assess pt understanding of correct HEP procedure. These visits will take place either virtually via Webex or via telephone, or in person.    Speech Therapy Frequency  --   once approx every four weeks   Duration  --  3 therapy visits    Treatment/Interventions  Aspiration precaution training;Pharyngeal strengthening exercises;Internal/external aids;Patient/family education;SLP instruction and feedback;Diet toleration management by SLP    Potential to Achieve Goals  Good    Potential Considerations  Cooperation/participation level    Consulted and Agree with Plan of Care  Patient       Patient will benefit from skilled therapeutic intervention in order to improve the following deficits and impairments:   Dysphagia, unspecified type    Problem List Patient Active Problem List   Diagnosis Date Noted  . Depression 05/22/2019  . Anxiety 04/04/2019  . Hyponatremia 03/28/2019  . Dehydration 03/28/2019  . Cancer of base of tongue ([place]) 03/24/2019  . Oropharyngeal dysphagia 03/14/2019  . Dysphasia 01/27/2019  . Hypertension 12/13/2016  . S/P mastectomy, left 10/18/2016  . Breast cancer ([place]) 10/18/2016  . Port catheter in place 05/23/2016  . Breast cancer of upper-outer quadrant of left female breast ([place]) 04/30/2016  . Conductive hearing loss in right ear 12/09/2015  . Enteritis 05/30/2013  . Prediabetes   . Macular degeneration, dry   . Vertigo   . Vitamin D deficiency 11/17/2008  . COLONIC POLYPS, HX OF 11/17/2008  . Hypothyroidism 11/19/2007  . HYPERLIPIDEMIA 11/19/2007  . Osteopenia 02/18/2007    [facility] ,[place], CCC-SLP  06/26/2019, 2:44 PM  [facility] [address] [place], [place], [address] Phone: [phone]   Fax:  [phone]   Name: Jessica Sherman MRN: [id number] Date of Birth: [birth year]

## 2019-07-07 DIAGNOSIS — C01 Malignant neoplasm of base of tongue: Secondary | ICD-10-CM

## 2019-07-07 DIAGNOSIS — Z923 Personal history of irradiation: Secondary | ICD-10-CM

## 2019-07-08 ENCOUNTER — Encounter (HOSPITAL_COMMUNITY): Payer: Self-pay | Admitting: Dentistry

## 2019-07-08 ENCOUNTER — Ambulatory Visit (HOSPITAL_COMMUNITY): Payer: Self-pay | Admitting: Dentistry

## 2019-07-08 ENCOUNTER — Other Ambulatory Visit: Payer: Self-pay

## 2019-07-08 VITALS — BP 152/58 | HR 62 | Temp 98.3°F | Wt 101.0 lb

## 2019-07-08 DIAGNOSIS — Z923 Personal history of irradiation: Secondary | ICD-10-CM

## 2019-07-08 DIAGNOSIS — R432 Parageusia: Secondary | ICD-10-CM

## 2019-07-08 DIAGNOSIS — C01 Malignant neoplasm of base of tongue: Secondary | ICD-10-CM

## 2019-07-08 DIAGNOSIS — K117 Disturbances of salivary secretion: Secondary | ICD-10-CM

## 2019-07-08 DIAGNOSIS — K031 Abrasion of teeth: Secondary | ICD-10-CM

## 2019-07-08 NOTE — Patient Instructions (Signed)
COVID-19 Education: The signs and symptoms of COVID-19 were discussed with the patient and how to seek care for testing (follow up with PCP or arrange E-visit).   The importance of social distancing was discussed today.  RECOMMENDATIONS: 1. Brush teeth after meals and at bedtime.  Use fluoride at bedtime. 2. Use trismus exercises as directed. 3. Use Biotene Rinse or salt water/baking soda rinses. 4. Multiple sips of water as needed for dry mouth. 5. Return to Dr. Derrek Gu, primary dentist, in 2 to 3 months for continued periodontal maintenance procedures. Monitor Covid 19 prevalence and reschedule as needed.   Lenn Cal, DDS

## 2019-07-08 NOTE — Progress Notes (Signed)
07/08/2019  Patient Name:   Jessica Sherman Date of Birth:   07/02/1931 Medical Record Number: [id number]  COVID 19 SCREENING: The patient does not symptoms concerning for COVID-19 infection (Including fever, chills, cough, or new SHORTNESS OF BREATH).    BP (!) 152/58 (BP Location: Left Arm)   Pulse 62   Temp 98.3 F (36.8 C)   Wt 101 lb (45.8 kg)   BMI 17.89 kg/m   [name] presents for oral examination after radiation therapy. Patient has completed all radiation treatments from 04/14/2019 through 06/02/2019. Patient did not have any chemotherapy.  REVIEW OF CHIEF COMPLAINTS:  DRY MOUTH: Yes HARD TO SWALLOW: No  HURT TO SWALLOW: No TASTE CHANGES: Taste is returning slowly SORES IN MOUTH: No TRISMUS: No problems with trismus WEIGHT: 101 pounds from initial 117 pounds at start of treatment   HOME OH REGIMEN:  BRUSHING: Twice a day FLOSSING: At bedtime RINSING: Using salt water and baking soda and Biotene rinses FLUORIDE: Using fluoride at bedtime but is having problems with some burning. TRISMUS EXERCISES:  Maximum interincisal opening: 40 mm at start of treatment.  MIO is currently 40 mm.   DENTAL EXAM:  Oral Hygiene:(PLAQUE): Good oral hygiene LOCATION OF MUCOSITIS: None noted DESCRIPTION OF SALIVA: Decreased saliva.  Incipient xerostomia. ANY EXPOSED BONE: None noted OTHER WATCHED AREAS: Multiple flexure lesions  DX: Xerostomia, Dysgeusia, and flexure lesions  RECOMMENDATIONS: 1. Brush teeth after meals and at bedtime.  Use fluoride at bedtime. 2. Use trismus exercises as directed. 3. Use Biotene Rinse or salt water/baking soda rinses. 4. Multiple sips of water as needed for dry mouth. 5. Return to Dr. Derrek Gu, primary dentist, in 2 to 3 months for continued periodontal maintenance procedures. Monitor Covid 19 prevalence and reschedule as needed.   Lenn Cal, DDS

## 2019-07-23 ENCOUNTER — Ambulatory Visit: Payer: PPO | Admitting: Nutrition

## 2019-07-23 NOTE — Progress Notes (Signed)
Patient called me and requested we change nutrition follow-up.  We both had time today so we went ahead and completed nutrition follow-up for base of tongue cancer. Reports she has gained a couple pounds and reports her weight on her home scale is between 102 and 103 pounds which is up from 100 pounds on October 9. Patient reports she is always hungry and has increased her oral intake. 24-hour dietary recall reveals patient appears to tolerate most textures of foods including cereal, toast, sandwiches, spaghetti, pot pie, ice cream and milkshakes. Reports she still has some taste alterations but they are improving. She has been using 1 bottle Osmolite 1.5 via her tube daily.  She has 4 more bottles left and then wishes to stop.  Nutrition diagnosis: Inadequate oral intake improving.  Intervention: Educated patient to continue strategies for eating small frequent meals and snacks with high-calorie, high-protein foods. Encourage patient to continue to monitor her home weight. Suggested discontinuing Osmolite 1.5 after her supply has run out.  She is to monitor her oral intake and weight. Patient has my contact information for questions. If patient continues to eat adequately and has continued weight gain, I recommend  feeding tube removal.  Monitoring, evaluation, goals: Patient will continue increased oral intake and associated weight gain so feeding tube can be removed.  Next visit: Patient has my contact information for questions or concerns.  **Disclaimer: This note was dictated with voice recognition software. Similar sounding words can inadvertently be transcribed and this note may contain transcription errors which may not have been corrected upon publication of note.**

## 2019-07-25 ENCOUNTER — Encounter: Payer: PPO | Admitting: Nutrition

## 2019-07-30 ENCOUNTER — Telehealth: Payer: Self-pay | Admitting: *Deleted

## 2019-07-30 ENCOUNTER — Ambulatory Visit: Payer: PPO | Admitting: Internal Medicine

## 2019-07-30 NOTE — Telephone Encounter (Signed)
Oncology Nurse Navigator Documentation  In support of appointment compliance for H&N patient follow-up with SLP Garald Balding, called patient, LVM reminder for tomorrow's 2:45 appt at [facility], encouraged arrival 15-20 minutes prior to appt for registration and arrival to Radiation Waiting.    Gayleen Orem, RN, BSN Head & Neck Oncology Nurse [place] at [place] [phone]

## 2019-07-31 ENCOUNTER — Ambulatory Visit: Payer: PPO | Attending: Radiation Oncology

## 2019-07-31 ENCOUNTER — Other Ambulatory Visit: Payer: Self-pay

## 2019-07-31 DIAGNOSIS — R1310 Dysphagia, unspecified: Secondary | ICD-10-CM | POA: Insufficient documentation

## 2019-07-31 NOTE — Therapy (Signed)
[facility] [address] [place], [place], [address] Phone: [phone]   Fax:  [phone]  Speech Language Pathology Treatment/Renewal Patient Details  Name: Jessica Sherman MRN: [id number] Date of Birth: 28-Jun-1931 Referring Provider (SLP): [name], MD   Encounter Date: 07/31/2019  End of Session - 07/31/19 1518    Visit Number  3    Number of Visits  6    Date for SLP Re-Evaluation  10/29/19   90 days   SLP Start Time  1440    SLP Stop Time   4580    SLP Time Calculation (min)  38 min    Activity Tolerance  Patient tolerated treatment well       Past Medical History:  Diagnosis Date  . Hx of colonic polyps   . Hyperlipidemia   . Hypothyroidism   . Macular degeneration, dry   . Osteopenia    spine BMD T score - 1.69  . PONV (postoperative nausea and vomiting)    pt has vertigo and can not lie flat  . Prediabetes    FBS 136  on 08/29/2010; A1c 6.2%  . Vertigo     Past Surgical History:  Procedure Laterality Date  . CATARACT EXTRACTION  2009   OS  . COLONOSCOPY W/ POLYPECTOMY     X 2; last 2012. Dr Watt Climes  . IR GASTROSTOMY TUBE MOD SED  04/17/2019  . IR GENERIC HISTORICAL  05/09/2016   IR US GUIDE VASC ACCESS RIGHT 05/09/2016 Jacqulynn Cadet, MD [facility]  . IR GENERIC HISTORICAL  05/09/2016   IR FLUORO GUIDE CV LINE RIGHT 05/09/2016 Jacqulynn Cadet, MD [facility]  . MODIFIED MASTECTOMY Left 10/18/2016   Procedure: LEFT MODIFIED RADICAL MASTECTOMY;  Surgeon: [name], MD;  Location: [facility];  Service: General;  Laterality: Left;  . PILONIDAL CYST EXCISION     X 3  . TONSILLECTOMY AND ADENOIDECTOMY    . TOTAL ABDOMINAL HYSTERECTOMY     w/ bladder repair    There were no vitals filed for this visit.  Subjective Assessment - 07/31/19 1442    Subjective  "Today I had a fish sandwich and french fries, oatmeal. It still doesn't taste like it used to."    Currently in Pain?  No/denies            ADULT SLP TREATMENT - 07/31/19 1443      General Information   Behavior/Cognition  Alert;Cooperative;Pleasant mood      Treatment Provided   Treatment provided  Dysphagia      Dysphagia Treatment   Temperature Spikes Noted  No    Respiratory Status  Room air    Patient observed directly with PO's  Yes    Type of PO's observed  Dysphagia 3 (soft);Thin liquids    Oral Phase Signs & Symptoms  Other (comment)   none noted today   Pharyngeal Phase Signs & Symptoms  Other (comment)   no overt s/sx   Other treatment/comments  Pt admitted to performing exericses "maybe once a day" - SLP reiterated pt must complete at least 5 dyas/week, BID, to allow herself best possible chance for WNL swallowing to continue. Pt stated she would do better next time. She rewq'd min A rarely for HEP procedure. As above, no overt s/sx aspiration with POs today.       Assessment / Recommendations / Plan   Plan  Continue with current plan of care      Dysphagia  Recommendations   Diet recommendations  --   as tolerated   Medication Administration  --   as tolerated     Progression Toward Goals   Progression toward goals  Not progressing toward goals (comment)   reduced compliance with HEP frequency      SLP Education - 07/31/19 1517    Education Details  Food journal, overt s/sx aspiration PNA, HEP procedure, need to complete HEP  BID at least 5 days/week    Person(s) Educated  Patient    Methods  Explanation;Demonstration;Verbal cues    Comprehension  Verbalized understanding;Returned demonstration;Verbal cues required;Need further instruction       SLP Short Term Goals - 06/26/19 1442      SLP SHORT TERM GOAL #1   Title  pt will demo undestanding of correct procedure for HEP with occasional min A    Period  --   visit, for all STGs   Status  Not Met      SLP SHORT TERM GOAL #2   Title  pt will tell SLP benefits of food journal with modified  independence    Status  Deferred   added to [place] #3   Title  pt will tell SLP 3 overt s/sx aspiration PNA with modified independence    Status  Deferred   added to [place] Term Goals - 07/31/19 1445      SLP LONG TERM GOAL #1   Title  pt will demonstrate understanding of correct procedure for HEP with rare min A    Time  3   renewed 07-31-19   Period  --   visits (visit #4), for all LTGs   Status  On-going      SLP LONG TERM GOAL #2   Title  pt will tell how a food journal can assist return to WNL textures/liquids with modified independence over 2 sessions    Status  Achieved      SLP LONG TERM GOAL #3   Title  pt to tell SLP 3 overt s/s aspiration PNA    Status  Achieved      SLP LONG TERM GOAL #4   Title  pt will adhere to small sips with liquids in ST session  to ensure pulmonary health/decr risk aspiration    Time  --    Status  Deferred   no longer having issues with liquids     SLP LONG TERM GOAL #5   Title  pt will tell SLP when to reduce frequency of HEP (Jan 10, 2020) to x2/week    Time  3    Period  --   visitis   Status  New       Plan - 07/31/19 1519    Clinical Impression Statement  Pt preents today with swallowing WFL/WNL for dys III and thin liquids. Pt denies overt s/sx aspiration PNA and none observed by SLP today. Pt admits to reduced complaince with HEP frewuency ("maybe once a day"). SLP reiterated rationale for HEP to pt, and pt stated she would complete HEP as prescirbed at least 5 days/week, BID. Pt would cont to benefit from skilled ST to assess safety with current POs as well as to assess pt understanding of correct HEP procedure. These visits will take place either virtually via Webex or via telephone, or in person.    Speech Therapy Frequency  --   once approx every four  weeks   Duration  --   5 total therapy visits   Treatment/Interventions  Aspiration precaution training;Pharyngeal strengthening  exercises;Internal/external aids;Patient/family education;SLP instruction and feedback;Diet toleration management by SLP    Potential to Achieve Goals  Good    Potential Considerations  Cooperation/participation level    Consulted and Agree with Plan of Care  Patient       Patient will benefit from skilled therapeutic intervention in order to improve the following deficits and impairments:   Dysphagia, unspecified type - Plan: SLP plan of care cert/re-cert    Problem List Patient Active Problem List   Diagnosis Date Noted  . Depression 05/22/2019  . Anxiety 04/04/2019  . Hyponatremia 03/28/2019  . Dehydration 03/28/2019  . Cancer of base of tongue ([place]) 03/24/2019  . Oropharyngeal dysphagia 03/14/2019  . Dysphasia 01/27/2019  . Hypertension 12/13/2016  . S/P mastectomy, left 10/18/2016  . Breast cancer ([place]) 10/18/2016  . Port catheter in place 05/23/2016  . Breast cancer of upper-outer quadrant of left female breast ([place]) 04/30/2016  . Conductive hearing loss in right ear 12/09/2015  . Enteritis 05/30/2013  . Prediabetes   . Macular degeneration, dry   . Vertigo   . Vitamin D deficiency 11/17/2008  . COLONIC POLYPS, HX OF 11/17/2008  . Hypothyroidism 11/19/2007  . HYPERLIPIDEMIA 11/19/2007  . Osteopenia 02/18/2007    , ,[place], CCC-SLP  07/31/2019, 3:23 PM  [facility] [address] [place], [place], [address] Phone: [phone]   Fax:  [phone]   Name: Jessica Sherman MRN: [id number] Date of Birth: 08/24/1931

## 2019-08-12 ENCOUNTER — Other Ambulatory Visit: Payer: Self-pay

## 2019-08-12 MED ORDER — SIMVASTATIN 20 MG PO TABS: 20.0000 mg | ORAL_TABLET | Freq: Every day | ORAL | 1 refills | Status: DC

## 2019-08-13 NOTE — Progress Notes (Deleted)
  Patient Name: Jessica Sherman MRN: [id number] DOB: 09-11-1931 Referring Physician: [name] (Profile Not Attached) Date of Service: 06/02/2019 [facility],                                                         End Of Treatment Note  Diagnoses: C01-Malignant neoplasm of base of tongue  Cancer Staging: Cancer Staging Breast cancer of upper-outer quadrant of left female breast [facility]) Staging form: Breast, AJCC 7th Edition - Clinical: Stage IIB (T2, N1, M0) - Unsigned - Pathologic: yT0, N0, cM0 - Unsigned  Cancer of base of tongue ([place]) Staging form: Pharynx - HPV-Mediated Oropharynx, AJCC 8th Edition - Clinical stage from 03/21/2019: Stage I (cT2, cN1, cM0, p16+) - Signed by [name], MD on 06/18/2019   Intent: Curative  Radiation Treatment Dates: 04/14/2019 through 06/02/2019  Site Technique Total Dose Dose per Fx Completed Fx Beam Energies  Head & neck: HN_BOT IMRT 70/70Gy 2 35/35 6X   Narrative: The patient tolerated radiation therapy relatively well.   Plan: The patient will follow-up with radiation oncology in 2 weeks .  -----------------------------------  [name], MD

## 2019-08-18 ENCOUNTER — Telehealth: Payer: Self-pay | Admitting: *Deleted

## 2019-08-18 NOTE — Telephone Encounter (Signed)
Oncology Nurse Navigator Documentation  Received call from Ms. Megan Salon.  She stated she has been eating/drinking 100% orally since before Thanksgiving, weight is stable.  She would like to have PEG removed.  She voiced understanding I will inform Dr. Isidore Moos, Jonelle Sidle from [facility] will call her upon order receipt to schedule appt.  Gayleen Orem, RN, BSN Head & Neck Oncology Nurse [place] at [place] [phone]

## 2019-08-19 ENCOUNTER — Other Ambulatory Visit: Payer: Self-pay | Admitting: *Deleted

## 2019-08-19 ENCOUNTER — Encounter: Payer: Self-pay | Admitting: *Deleted

## 2019-08-19 ENCOUNTER — Other Ambulatory Visit: Payer: Self-pay | Admitting: Radiation Oncology

## 2019-08-19 ENCOUNTER — Telehealth: Payer: Self-pay | Admitting: *Deleted

## 2019-08-19 DIAGNOSIS — C01 Malignant neoplasm of base of tongue: Secondary | ICD-10-CM

## 2019-08-19 NOTE — Telephone Encounter (Signed)
Oncology Nurse Navigator Documentation  Spoke with Ms. Jessica Sherman, informed order placed for PEG to be removed late next week/early following week, asked her to call me if she begins to lose weight on oral diet.  She voiced understanding.  Gayleen Orem, RN, BSN Head & Neck Oncology Nurse [facility] at [place] [phone]

## 2019-08-21 DIAGNOSIS — H5203 Hypermetropia, bilateral: Secondary | ICD-10-CM | POA: Diagnosis not present

## 2019-08-21 DIAGNOSIS — H40052 Ocular hypertension, left eye: Secondary | ICD-10-CM | POA: Diagnosis not present

## 2019-08-21 DIAGNOSIS — H353132 Nonexudative age-related macular degeneration, bilateral, intermediate dry stage: Secondary | ICD-10-CM | POA: Diagnosis not present

## 2019-08-21 DIAGNOSIS — Z961 Presence of intraocular lens: Secondary | ICD-10-CM | POA: Diagnosis not present

## 2019-08-26 ENCOUNTER — Telehealth: Payer: Self-pay | Admitting: *Deleted

## 2019-08-26 NOTE — Telephone Encounter (Signed)
Oncology Nurse Navigator Documentation  Spoke with Jessica Sherman to confirm understanding of Friday's 12:30 appt [facility] Radiology for PEG removal.  She confirmed, also, she has continued with 100% oral intake since we last spoke, is maintaining consistent wt of ca 104 lbs.  She further noted she is eating just about anything she wishes, sense of taste continues to improve.    Gayleen Orem, RN, BSN Head & Neck Oncology Nurse [place] at [place] [phone]

## 2019-08-29 ENCOUNTER — Ambulatory Visit (HOSPITAL_COMMUNITY)
Admission: RE | Admit: 2019-08-29 | Discharge: 2019-08-29 | Disposition: A | Discharge status: Home / Self Care | Payer: PPO | Source: Ambulatory Visit | Attending: Radiation Oncology | Admitting: Radiation Oncology

## 2019-08-29 ENCOUNTER — Other Ambulatory Visit: Payer: Self-pay

## 2019-08-29 DIAGNOSIS — Z931 Gastrostomy status: Secondary | ICD-10-CM | POA: Diagnosis not present

## 2019-08-29 DIAGNOSIS — R1310 Dysphagia, unspecified: Secondary | ICD-10-CM | POA: Insufficient documentation

## 2019-08-29 DIAGNOSIS — C01 Malignant neoplasm of base of tongue: Secondary | ICD-10-CM

## 2019-08-29 DIAGNOSIS — Z431 Encounter for attention to gastrostomy: Secondary | ICD-10-CM | POA: Diagnosis not present

## 2019-08-29 HISTORY — PX: IR GASTROSTOMY TUBE REMOVAL: IMG5492

## 2019-08-29 MED ORDER — LIDOCAINE VISCOUS HCL 2 % MT SOLN
OROMUCOSAL | Status: AC
  Filled 2019-08-29: qty 15

## 2019-08-29 NOTE — Procedures (Signed)
Pre-procedure diagnosis: Dysphagia Post-procedure diagnosis: Same  Successful bedside removal of intact pull through G-tube. No immediate post procedural complications. EBL: zero  Please see imaging section of Epic for full dictation.  Joaquim Nam 08/29/2019 12:22 PM

## 2019-09-09 ENCOUNTER — Telehealth: Payer: Self-pay | Admitting: *Deleted

## 2019-09-09 NOTE — Telephone Encounter (Signed)
CALLED PATIENT TO INFORM OF PET SCAN FOR 09-22-19 - ARRIVAL TIME- 12:30 PM, PATIENT TO BE NPO- 6 HRS. PRIOR TO TEST, NO HARD CANDY , NO GUM, PT. TO AVOID CARBS (MEAL PRIOR TO TEST), AND PATIENT TO RECEIVE RESULTS FROM DR. SQUIRE ON 09-23-19 @ 2:40 PM, SPOKE WITH PATIENT AND SHE VERIFIED UNDERSTANDING THESE APPTS.

## 2019-09-10 ENCOUNTER — Other Ambulatory Visit: Payer: Self-pay | Admitting: Internal Medicine

## 2019-09-11 ENCOUNTER — Other Ambulatory Visit: Payer: Self-pay | Admitting: Internal Medicine

## 2019-09-11 MED ORDER — LEVOTHYROXINE SODIUM 88 MCG PO TABS: 88.0000 ug | ORAL_TABLET | ORAL | 0 refills | Status: DC

## 2019-09-11 NOTE — Telephone Encounter (Signed)
Copied from [place] [phone]. Topic: Quick Communication - Rx Refill/Question >> Sep 11, 2019 10:17 AM Rainey Pines A wrote: Medication: levothyroxine (SYNTHROID) 88 MCG tablet (Patient stated that she is completely out and would like her request expedited.)  Has the patient contacted their pharmacy? {Yes (Agent: If no, request that the patient contact the pharmacy for the refill.) (Agent: If yes, when and what did the pharmacy advise?)Contact PCP  Preferred Pharmacy (with phone number or street name): [facility] [id number] - [place], [address] AT [facility] [address]  Phone:  [phone] Fax:  [phone]     Agent: Please be advised that RX refills may take up to 3 business days. We ask that you follow-up with your pharmacy.

## 2019-09-17 ENCOUNTER — Telehealth: Payer: Self-pay | Admitting: *Deleted

## 2019-09-17 NOTE — Telephone Encounter (Signed)
Oncology Nurse Navigator Documentation  In support of appointment compliance for H&N patient follow-up with SLP Garald Balding, called Ms. Jessica Sherman, confirmed understanding of tomorrow's 1:15 appointment at [facility], encouraged arrival 15-20 minutes prior for registration followed by arrival to Radiation Waiting.    Gayleen Orem, RN, BSN Head & Neck Oncology Nurse [place] at [place] [phone]

## 2019-09-18 ENCOUNTER — Other Ambulatory Visit: Payer: Self-pay

## 2019-09-18 ENCOUNTER — Ambulatory Visit: Payer: PPO | Attending: Radiation Oncology

## 2019-09-18 DIAGNOSIS — R1310 Dysphagia, unspecified: Secondary | ICD-10-CM | POA: Diagnosis not present

## 2019-09-18 NOTE — Therapy (Signed)
[facility] [address] [place], [place], [address] Phone: [phone]   Fax:  [phone]  Speech Language Pathology Treatment  Patient Details  Name: Jessica Sherman MRN: [id number] Date of Birth: May 08, 1931 Referring Provider (SLP): [name], MD   Encounter Date: 09/18/2019  End of Session - 09/18/19 1659    Visit Number  4    Number of Visits  6    Date for SLP Re-Evaluation  10/29/19    SLP Start Time  1320    SLP Stop Time   1353    SLP Time Calculation (min)  33 min    Activity Tolerance  Patient tolerated treatment well       Past Medical History:  Diagnosis Date  . Hx of colonic polyps   . Hyperlipidemia   . Hypothyroidism   . Macular degeneration, dry   . Osteopenia    spine BMD T score - 1.69  . PONV (postoperative nausea and vomiting)    pt has vertigo and can not lie flat  . Prediabetes    FBS 136  on 08/29/2010; A1c 6.2%  . Vertigo     Past Surgical History:  Procedure Laterality Date  . CATARACT EXTRACTION  2009   OS  . COLONOSCOPY W/ POLYPECTOMY     X 2; last 2012. Dr Watt Climes  . IR GASTROSTOMY TUBE MOD SED  04/17/2019  . IR GASTROSTOMY TUBE REMOVAL  08/29/2019  . IR GENERIC HISTORICAL  05/09/2016   IR US GUIDE VASC ACCESS RIGHT 05/09/2016 Jacqulynn Cadet, MD [facility]  . IR GENERIC HISTORICAL  05/09/2016   IR FLUORO GUIDE CV LINE RIGHT 05/09/2016 Jacqulynn Cadet, MD [facility]  . MODIFIED MASTECTOMY Left 10/18/2016   Procedure: LEFT MODIFIED RADICAL MASTECTOMY;  Surgeon: [name], MD;  Location: [facility];  Service: General;  Laterality: Left;  . PILONIDAL CYST EXCISION     X 3  . TONSILLECTOMY AND ADENOIDECTOMY    . TOTAL ABDOMINAL HYSTERECTOMY     w/ bladder repair    There were no vitals filed for this visit.         ADULT SLP TREATMENT - 09/18/19 1333      General Information   Behavior/Cognition  Alert;Cooperative;Pleasant mood       Treatment Provided   Treatment provided  Dysphagia      Dysphagia Treatment   Temperature Spikes Noted  No    Respiratory Status  Room air    Patient observed directly with PO's  Yes    Type of PO's observed  Dysphagia 3 (soft);Thin liquids    Oral Phase Signs & Symptoms  Other (comment)   no overt s/sx noted   Pharyngeal Phase Signs & Symptoms  Other (comment)   no overt s/sx noted   Other treatment/comments  Pt reports slightly higher compliance with the HEP frequency and scope. SLP provided occasional min A with HEP procedure today - pt states she does effortful and masako most often. SLP enocuraged her to complete HEP at least 5 days/week, BID.      Assessment / Recommendations / Plan   Plan  Other (Comment)   cont in two months, due to pt decision re: HEP frequency     Dysphagia Recommendations   Diet recommendations  --   as tolerated   Liquids provided via  Cup;Straw    Medication Administration  --   as tolerated   Compensations  Small sips/bites  Progression Toward Goals   Progression toward goals  Progressing toward goals   better compliance than Nov but cont suboptimal      SLP Education - 09/18/19 1658    Education Details  do HEP at leat 5 days/week BID    Person(s) Educated  Patient    Methods  Explanation    Comprehension  Verbalized understanding       SLP Short Term Goals - 06/26/19 1442      SLP SHORT TERM GOAL #1   Title  pt will demo undestanding of correct procedure for HEP with occasional min A    Period  --   visit, for all STGs   Status  Not Met      SLP SHORT TERM GOAL #2   Title  pt will tell SLP benefits of food journal with modified independence    Status  Deferred   added to [place] #3   Title  pt will tell SLP 3 overt s/sx aspiration PNA with modified independence    Status  Deferred   added to [place] Term Goals - 09/18/19 1656      SLP LONG TERM GOAL #1   Title  pt will demonstrate  understanding of correct procedure for HEP with rare min A    Time  2   renewed 07-31-19   Period  --   visits (visit #4), for all LTGs   Status  On-going      SLP LONG TERM GOAL #2   Title  pt will tell how a food journal can assist return to WNL textures/liquids with modified independence over 2 sessions    Status  Achieved      SLP LONG TERM GOAL #3   Title  pt to tell SLP 3 overt s/s aspiration PNA    Status  Achieved      SLP LONG TERM GOAL #4   Title  pt will adhere to small sips with liquids in ST session  to ensure pulmonary health/decr risk aspiration    Status  Deferred   no longer having issues with liquids     SLP LONG TERM GOAL #5   Title  pt will tell SLP when to reduce frequency of HEP (Jan 10, 2020) to x2/week    Time  3    Period  --   visitis   Status  On-going       Plan - 09/18/19 1655    Clinical Impression Statement  Pt preents today with swallowing WFL/WNL for dys III and thin liquids. Pt denies overt s/sx aspiration PNA and none observed by SLP today. Pt appears to exhibit reduced complaince with HEP frewuencybut improved over last visit in November. SLP reiterated rationale for HEP to pt, and pt stated she would complete HEP as prescirbed at least 5 days/week, BID. Pt would cont to benefit from skilled ST to assess safety with current POs as well as to assess pt understanding of correct HEP procedure. These visits will take place either virtually via Webex or via telephone, or in person.    Speech Therapy Frequency  --   once approx every four weeks   Duration  --   5 total therapy visits   Treatment/Interventions  Aspiration precaution training;Pharyngeal strengthening exercises;Internal/external aids;Patient/family education;SLP instruction and feedback;Diet toleration management by SLP    Potential to Achieve Goals  Good    Potential Considerations  Cooperation/participation level    Consulted and Agree with Plan of Care  Patient       Patient will  benefit from skilled therapeutic intervention in order to improve the following deficits and impairments:   Dysphagia, unspecified type    Problem List Patient Active Problem List   Diagnosis Date Noted  . Depression 05/22/2019  . Anxiety 04/04/2019  . Hyponatremia 03/28/2019  . Dehydration 03/28/2019  . Cancer of base of tongue ([place]) 03/24/2019  . Oropharyngeal dysphagia 03/14/2019  . Dysphasia 01/27/2019  . Hypertension 12/13/2016  . S/P mastectomy, left 10/18/2016  . Breast cancer ([place]) 10/18/2016  . Port catheter in place 05/23/2016  . Breast cancer of upper-outer quadrant of left female breast ([facility]) 04/30/2016  . Conductive hearing loss in right ear 12/09/2015  . Enteritis 05/30/2013  . Prediabetes   . Macular degeneration, dry   . Vertigo   . Vitamin D deficiency 11/17/2008  . COLONIC POLYPS, HX OF 11/17/2008  . Hypothyroidism 11/19/2007  . HYPERLIPIDEMIA 11/19/2007  . Osteopenia 02/18/2007    [facility] ,MS, CCC-SLP  09/18/2019, 4:59 PM  [facility] [address] [place], [place], [address] Phone: [phone]   Fax:  [phone]   Name: Jessica Sherman MRN: [id number] Date of Birth: 03/17/1931

## 2019-09-19 ENCOUNTER — Telehealth: Payer: Self-pay | Admitting: *Deleted

## 2019-09-19 NOTE — Telephone Encounter (Signed)
Oncology Nurse Navigator Documentation  In follow-up to call from dtr, called Ms. Jessica Sherman to clarify next Tuesday's f/u appt with Dr. Isidore Moos.  She stated preference for telephone call to discuss results of Monday's PET scan.  I indicated I will inform Dr. Isidore Moos and RN Anderson Malta Malmfelt.  She voiced appreciation.  Gayleen Orem, RN, BSN Head & Neck Oncology Nurse [place] at [place] [phone]

## 2019-09-22 ENCOUNTER — Ambulatory Visit (HOSPITAL_COMMUNITY)
Admission: RE | Admit: 2019-09-22 | Discharge: 2019-09-22 | Disposition: A | Discharge status: Home / Self Care | Payer: PPO | Source: Ambulatory Visit | Attending: Radiation Oncology | Admitting: Radiation Oncology

## 2019-09-22 DIAGNOSIS — I2510 Atherosclerotic heart disease of native coronary artery without angina pectoris: Secondary | ICD-10-CM | POA: Insufficient documentation

## 2019-09-22 DIAGNOSIS — I700 Atherosclerosis of aorta: Secondary | ICD-10-CM | POA: Insufficient documentation

## 2019-09-22 DIAGNOSIS — C01 Malignant neoplasm of base of tongue: Secondary | ICD-10-CM

## 2019-09-22 LAB — GLUCOSE, CAPILLARY: Glucose-Capillary: 83 mg/dL (ref 70–99)

## 2019-09-22 MED ORDER — FLUDEOXYGLUCOSE F - 18 (FDG) INJECTION
5.0800 | Freq: Once | INTRAVENOUS | Status: AC
  Administered 2019-09-22: 5.08 via INTRAVENOUS

## 2019-09-23 ENCOUNTER — Ambulatory Visit
Admission: RE | Admit: 2019-09-23 | Discharge: 2019-09-23 | Disposition: A | Discharge status: Home / Self Care | Payer: PPO | Source: Ambulatory Visit | Attending: Radiation Oncology | Admitting: Radiation Oncology

## 2019-09-23 ENCOUNTER — Encounter: Payer: Self-pay | Admitting: Radiation Oncology

## 2019-09-23 ENCOUNTER — Telehealth: Payer: Self-pay | Admitting: *Deleted

## 2019-09-23 DIAGNOSIS — Z08 Encounter for follow-up examination after completed treatment for malignant neoplasm: Secondary | ICD-10-CM | POA: Diagnosis not present

## 2019-09-23 DIAGNOSIS — C01 Malignant neoplasm of base of tongue: Secondary | ICD-10-CM

## 2019-09-23 NOTE — Progress Notes (Signed)
Radiation Oncology         [phone] ________________________________  Name: Jessica Sherman MRN: [id number]  Date: 09/23/2019  DOB: Feb 16, 1931  Follow-Up Visit Note by phone due to pandemic precautions, patient unable to use My Chart Video  CC: [name], MD  [name], MD  Diagnosis and Prior Radiotherapy:       ICD-10-CM   1. Cancer of base of tongue ([place])  C01     04/14/2019 through 06/02/2019 Site Technique Total Dose Dose per Fx Completed Fx Beam Energies  Head & neck: HN_BOT IMRT 70/70 2 35/35 6X   CHIEF COMPLAINT:  Here for follow-up and surveillance of base of tongue cancer  Narrative:  The patient returns today for routine follow-up.   She states her weight is 103lb, appetite is good, eating well. Just ate chicken and and an ice cream sandwich.  Wt Readings from Last 3 Encounters:  07/08/19 101 lb (45.8 kg)  05/23/19 105 lb (47.6 kg)  03/29/19 106 lb 1.6 oz (48.1 kg)   Swallowing issues, if any: doing well  Smoking or chewing tobacco? No  Last ENT visit was on: Not since diagnosis  She feels terrific, no concerns  I reviewed her PET images - NED.  ALLERGIES:  is allergic to other.  Meds: Current Outpatient Medications  Medication Sig Dispense Refill  . amLODipine (NORVASC) 5 MG tablet Take 1 tablet (5 mg total) by mouth daily. (Patient taking differently: Take 5 mg by mouth at bedtime. ) 90 tablet 3  . Calcium Carb-Cholecalciferol (CALCIUM 600+D3 PO) Take 1 tablet by mouth 2 (two) times daily.    . Cholecalciferol (VITAMIN D3) 1000 UNITS CAPS Take 1,000 Units by mouth daily.     . fluconazole (DIFLUCAN) 100 MG tablet Take 2 tablets today, then 1 tablet daily x 20 more days. Hold simvastatin while on this. 22 tablet 0  . levothyroxine (SYNTHROID) 88 MCG tablet TAKE 1 TABLET BY MOUTH EVERY DAY EXCEPT ON WEDNESDAY TAKE 1/2 TABLET 90 tablet 1  . lidocaine (XYLOCAINE) 2 % solution Patient: Mix 1part 2% viscous lidocaine, 1part H20. Swish & swallow 1mL  of diluted mixture, 86min before meals and at bedtime, up to QID for soreness 100 mL 5  . LORazepam (ATIVAN) 0.5 MG tablet Take 0.5 tablets (0.25 mg total) by mouth 2 (two) times daily as needed for anxiety. 30 tablet 0  . Multiple Vitamins-Minerals (PRESERVISION AREDS PO) Take by mouth.    . ondansetron (ZOFRAN ODT) 4 MG disintegrating tablet Take 1 tablet (4 mg total) by mouth every 8 (eight) hours as needed for nausea or vomiting. 30 tablet 3  . simvastatin (ZOCOR) 20 MG tablet Take 1 tablet (20 mg total) by mouth at bedtime. 90 tablet 1  . sodium fluoride (PREVIDENT 5000 PLUS) 1.1 % CREA dental cream Place gel into fluoride tray at bedtime.  Place over teeth for 5 minutes.  Remove.  Spit out excess.  Repeat nightly. 1 Tube prn   No current facility-administered medications for this encounter.    Physical Findings: The patient is in no acute distress. Patient is alert and oriented. Wt Readings from Last 3 Encounters:  07/08/19 101 lb (45.8 kg)  05/23/19 105 lb (47.6 kg)  03/29/19 106 lb 1.6 oz (48.1 kg)    vitals were not taken for this visit. .  General: Alert and oriented, in no acute distress    Lab Findings: Lab Results  Component Value Date   WBC 11.3 (H) 04/17/2019  HGB 12.1 04/17/2019   HCT 33.2 (L) 04/17/2019   MCV 96.0 04/17/2019   PLT 318 04/17/2019    Lab Results  Component Value Date   TSH 1.281 03/29/2019    Radiographic Findings: NM PET Image Restag (PS) Skull Base To Thigh  Result Date: 09/22/2019 CLINICAL DATA:  Subsequent treatment strategy for base of tongue cancer. EXAM: NUCLEAR MEDICINE PET SKULL BASE TO THIGH TECHNIQUE: 5.1 mCi F-18 FDG was injected intravenously. Full-ring PET imaging was performed from the skull base to thigh after the radiotracer. CT data was obtained and used for attenuation correction and anatomic localization. Fasting blood glucose: 83 mg/dl COMPARISON:  03/19/2019 and CT neck 03/13/2019. CT chest abdomen pelvis 05/10/2016.  FINDINGS: Mediastinal blood pool activity: SUV max 2.5 Liver activity: SUV max NA NECK: No abnormal hypermetabolism. Asymmetric FDG uptake within left sternocleidomastoid muscle is presumably physiologic. Incidental CT findings: None. CHEST: No hypermetabolic mediastinal, hilar or axillary lymph nodes. No hypermetabolic pulmonary nodules. Incidental CT findings: Atherosclerotic calcification of the aorta and coronary arteries. Heart size normal. No pericardial or pleural effusion. Biapical pleuroparenchymal scarring. Scattered mucoid impaction, especially in the right middle lobe. ABDOMEN/PELVIS: No abnormal hypermetabolism in the liver, adrenal glands, spleen pancreas. No hypermetabolic lymph nodes. Incidental CT findings: Liver is unremarkable. There may be sludge in the gallbladder. Adrenal glands, kidneys, spleen, pancreas, stomach and bowel are unremarkable. Atherosclerotic calcification of the aorta without aneurysm. SKELETON: No abnormal osseous hypermetabolism. Incidental CT findings: Degenerative changes in the spine. IMPRESSION: 1. No abnormal hypermetabolism in the neck, chest, abdomen or pelvis. Previously seen right tongue base mass is no longer visualized. 2. Gallbladder sludge. 3. Aortic atherosclerosis (ICD10-I70.0). Coronary artery calcification. Electronically Signed   By: Lorin Picket M.D.   On: 09/22/2019 15:44   IR GASTROSTOMY TUBE REMOVAL  Result Date: 08/29/2019 INDICATION: Patient with history of tongue cancer s/p placement of 20 Fr pull-through gastrostomy tube 04/17/19 by Dr. Pascal Lux. Patient tolerating 100% of PO intake and has maintained an appropriate weight. IR has been asked to remove the g-tube today. EXAM: BEDSIDE REMOVAL OF GASTROSTOMY TUBE COMPARISON:  None. MEDICATIONS: Viscous lidocaine CONTRAST:  None FLUOROSCOPY TIME:  None COMPLICATIONS: None immediate. PROCEDURE: A time-out was performed prior to the initiation of the procedure. The track of the existing gastrostomy tube  was lubricated with viscous lidocaine. Using manual traction, the existing pull-through gastrostomy tube was removed intact. A dressing was placed. The patient tolerated the procedure well without immediate postprocedural complication. IMPRESSION: Successful bedside removal of pull-through gastrostomy tube without complication Read by Candiss Norse, PA-C Electronically Signed   By: Jerilynn Mages.  Shick M.D.   On: 08/29/2019 12:24    Impression/Plan:    1) Head and Neck Cancer Status: NED  2) Nutritional Status: eating well, weight stable PEG tube: has been removed  3) Swallowing: good function, continue exercises via SLP  4) Dental: Encouraged to continue regular followup with dentistry, and dental hygiene including fluoride treatments (she will discuss w/ dentist). Compliant. Has appt next month with dentist. Using Biotene.  5) skin: healing well.  6) Thyroid function:  Take levothyroxine per outside MD (preceding condition). Energy is great. Lab Results  Component Value Date   TSH 1.281 03/29/2019    7) Other: f/u with Dr. Redmond Baseman  in 36mo. F/u with me in 68mo, sooner if needed.  8) We discussed measures to reduce the risk of infection during the COVID-19 pandemic.  Vaccine scheduled for Jan 29th at [facility].  This encounter was provided by telemedicine  platform phone as patient couldn't access Webex.  The patient has given verbal consent for this type of encounter and has been advised to only accept a meeting of this type in a secure network environment. The time spent during this encounter on date of service, in total, was 25 minutes. The attendants for this meeting include [name]  and [name].  During the encounter, [name] was located at [facility].  [name] was located at home.  _____________________________________   [name], MD  This document serves as a record of services personally  performed by [name], MD. It was created on her behalf by Wilburn Mylar, a trained medical scribe. The creation of this record is based on the scribe's personal observations and the provider's statements to them. This document has been checked and approved by the attending provider.

## 2019-09-23 NOTE — Telephone Encounter (Signed)
CALLED PATIENT TO INFORM OF FU WITH DR. Isidore Moos ON 03/26/20 @ 2:20 PM, NO ANSWER WILL CALL LATER

## 2019-09-25 ENCOUNTER — Telehealth: Payer: Self-pay | Admitting: *Deleted

## 2019-10-02 ENCOUNTER — Other Ambulatory Visit: Payer: Self-pay | Admitting: *Deleted

## 2019-10-04 NOTE — Telephone Encounter (Signed)
Oncology Nurse Navigator Documentation  Per patient's 1/12 post-treatment follow-up with Dr. Isidore Moos, sent fax to [facility] ENT Scheduling with request Ms. Keryl be contacted and scheduled for routine post-RT follow-up in 3 months with Dr. Redmond Baseman.  Notification of successful fax transmission received. Later received email from [facility] indicating VMM left for pt for 3/31 appt.  Gayleen Orem, RN, BSN Head & Neck Oncology Nurse [place] at [place] [phone]

## 2019-10-06 ENCOUNTER — Other Ambulatory Visit: Payer: Self-pay | Admitting: Radiation Oncology

## 2019-10-06 NOTE — Progress Notes (Signed)
I called the patient to let her know that on second look of her PET scan at ENT tumor board there were nonspecific changes in her right breast.  Out of caution, mammogram and ultrasound are recommended.  It has been about a year and a half since she last underwent breast imaging.  Our breast navigator is going to arrange imaging at [facility] and arrange for tumor board presentation once the patient is ready.  Jessica Sherman is pleased with this plan.  -----------------------------------  [name], MD

## 2019-10-09 DIAGNOSIS — R928 Other abnormal and inconclusive findings on diagnostic imaging of breast: Secondary | ICD-10-CM | POA: Diagnosis not present

## 2019-10-09 DIAGNOSIS — N6489 Other specified disorders of breast: Secondary | ICD-10-CM | POA: Diagnosis not present

## 2019-10-10 ENCOUNTER — Ambulatory Visit: Payer: PPO

## 2019-10-13 ENCOUNTER — Encounter: Payer: Self-pay | Admitting: *Deleted

## 2019-10-16 ENCOUNTER — Ambulatory Visit: Payer: PPO | Attending: Internal Medicine

## 2019-10-16 DIAGNOSIS — Z23 Encounter for immunization: Secondary | ICD-10-CM | POA: Insufficient documentation

## 2019-10-16 NOTE — Progress Notes (Signed)
   Covid-19 Vaccination Clinic  Name:  Jessica Sherman    MRN: [id number] DOB: [birth year]  10/16/2019  [name] was observed post Covid-19 immunization for 15 minutes without incidence. She was provided with Vaccine Information Sheet and instruction to access the V-Safe system.   [name] was instructed to call 911 with any severe reactions post vaccine: Marland Kitchen Difficulty breathing  . Swelling of your face and throat  . A fast heartbeat  . A bad rash all over your body  . Dizziness and weakness    Immunizations Administered    Name Date Dose VIS Date Route   Pfizer COVID-19 Vaccine 10/16/2019 10:09 AM 0.3 mL 08/22/2019 Intramuscular   Manufacturer: [place]   Lot: [id number]   [place]: [id number]

## 2019-11-02 NOTE — Progress Notes (Signed)
Patient Care Team: [name], MD as PCP - General (Internal Medicine) [name], Charlestine Massed, NP as Nurse Practitioner (Hematology and Oncology) [name], MD as Consulting Physician (Hematology and Oncology) Bensimhon, Shaune Pascal, MD as Consulting Physician (Cardiology) [name], MD as Consulting Physician (General Surgery) [name], MD as Consulting Physician (Otolaryngology) [name], MD as Attending Physician (Radiation Oncology) [name], RN as Oncology Nurse Navigator Schinke, [place], [place] as Speech Language Pathologist (Speech Pathology) [name], RD as Dietitian (Nutrition)  DIAGNOSIS:    ICD-10-CM   1. Cancer of base of tongue ([place])  C01   2. Malignant neoplasm of upper-outer quadrant of left breast in female, estrogen receptor negative ([place])  C50.412    Z17.1     SUMMARY OF ONCOLOGIC HISTORY: Oncology History  Breast cancer of upper-outer quadrant of left female breast ([place])  04/12/2016 Initial Diagnosis   Left breast biopsy: IDC grade 2-3, left axillary LN positive for metastatic IDC; mammogram revealed a 5 cm solid mass left breast 12:00 anterior depth, ER 0%, PR 0%, Ki-67 80%, HER-2 positive ratio 7.6, copy #16.5   05/10/2016 Imaging   Bone scan normal; CT CAP: left breast cancer, left axillary and subpectoral nodes are not pathologic by size criteriabut suspicious, no metastatic disease   05/16/2016 Breast MRI   Left breast solid with cystic appearance OUQ 1:00 crossing into the medial left breast 4.7 x 3.3 x 4.2 cm; second 37m nodule left breast UOQ: Posterior intramammary/low axillary lymph node, indeterminate left axillary lymph nodes, skin thickening   05/17/2016 - 08/29/2016 Neo-Adjuvant Chemotherapy   Taxol weekly 12 Herceptin and Perjeta every 3 weeks 6   09/05/2016 Breast MRI   Breast MRI: Complete Imaging response   10/18/2016 Surgery   Left modified radical mastectomy: Complete response to treatment 0/9 lymph  nodes negative ypT0ypN0    03/12/2019 Relapse/Recurrence   Patient had dysphagia and pain in neck. CT neck showed a large mass at the base of the tongue compatible with carcinoma and a 9 mm right level 2 lymph node which shows increased enhancement and is suspicious for metastatic disease. PET scan showed the large right tongue base mass consistent with neoplasm, a 464mpulmonary nodule that was indeterminate. No evidence of metastases in the chest, abdomen or pelvis.   [place] of [place] ([place] 03/13/2019 Imaging   CT neck w/ contrast: IMPRESSION: Large mass lesion base of tongue on the right compatible with carcinoma.   9 mm right level 2 lymph node which shows increased enhancement and is suspicious for metastatic disease. Correlate with PET-CT.   03/19/2019 Imaging   PET: IMPRESSION: 1. Large right tongue base mass is markedly hypermetabolic and consistent with neoplasm. 2. No neck adenopathy or evidence of metastatic disease in the chest, abdomen or pelvis. 3. Single 4 mm nodule in the right middle lobe is indeterminate. Recommend follow-up noncontrast chest CT in 6-12 months. 4. Surgical changes from a left mastectomy. No chest wall mass or supraclavicular or axillary adenopathy.   03/21/2019 Cancer Staging   Staging form: Pharynx - HPV-Mediated Oropharynx, AJCC 8th Edition - Clinical stage from 03/21/2019: Stage I (cT2, cN1, cM0, p16+) - Signed by SqEppie GibsonMD on 06/18/2019   03/24/2019 Initial Diagnosis   Cancer of base of tongue ([facility]    CHIEF COMPLIANT: Surveillance of breast cancer  INTERVAL HISTORY: Jessica Sherman a [age].o. with above-mentioned history of HER2 positiveleft breast cancer treated with neoadjuvant chemotherapy with  complete response, leftmastectomy, and adjuvant Herceptin.She is currently followed by Dr. Maylon Peppers for cancer of the base of the tongue. PET scan on 09/22/19 showed a right breast abnormality. Mammogram and [place] on 10/09/19 showed no  evidence of malignancy. She presents to the clinic today for follow-up.  She has healed and recovered very well from recent radiation to the base of tongue.  She had to have a PEG tube placement briefly.  Her taste buds are coming back.  Energy levels are better.  She is gaining weight.  She is very happy with the PET CT scan showed complete remission.  She follows with Dr. Isidore Moos for her head and neck cancer.  She had a mammogram that was negative.  ALLERGIES:  is allergic to other.  MEDICATIONS:  Current Outpatient Medications  Medication Sig Dispense Refill  . amLODipine (NORVASC) 5 MG tablet Take 1 tablet (5 mg total) by mouth daily. (Patient taking differently: Take 5 mg by mouth at bedtime. ) 90 tablet 3  . Calcium Carb-Cholecalciferol (CALCIUM 600+D3 PO) Take 1 tablet by mouth 2 (two) times daily.    . Cholecalciferol (VITAMIN D3) 1000 UNITS CAPS Take 1,000 Units by mouth daily.     . fluconazole (DIFLUCAN) 100 MG tablet Take 2 tablets today, then 1 tablet daily x 20 more days. Hold simvastatin while on this. 22 tablet 0  . levothyroxine (SYNTHROID) 88 MCG tablet TAKE 1 TABLET BY MOUTH EVERY DAY EXCEPT ON WEDNESDAY TAKE 1/2 TABLET 90 tablet 1  . lidocaine (XYLOCAINE) 2 % solution Patient: Mix 1part 2% viscous lidocaine, 1part H20. Swish & swallow 61m of diluted mixture, 325m before meals and at bedtime, up to QID for soreness 100 mL 5  . LORazepam (ATIVAN) 0.5 MG tablet Take 0.5 tablets (0.25 mg total) by mouth 2 (two) times daily as needed for anxiety. 30 tablet 0  . Multiple Vitamins-Minerals (PRESERVISION AREDS PO) Take by mouth.    . ondansetron (ZOFRAN ODT) 4 MG disintegrating tablet Take 1 tablet (4 mg total) by mouth every 8 (eight) hours as needed for nausea or vomiting. 30 tablet 3  . simvastatin (ZOCOR) 20 MG tablet Take 1 tablet (20 mg total) by mouth at bedtime. 90 tablet 1  . sodium fluoride (PREVIDENT 5000 PLUS) 1.1 % CREA dental cream Place gel into fluoride tray at bedtime.   Place over teeth for 5 minutes.  Remove.  Spit out excess.  Repeat nightly. 1 Tube prn   No current facility-administered medications for this visit.    PHYSICAL EXAMINATION: ECOG PERFORMANCE STATUS: 1 - Symptomatic but completely ambulatory  Vitals:   11/03/19 1036  BP: (!) 161/53  Pulse: 65  Resp: 17  Temp: 97.8 F (36.6 C)  SpO2: 100%   Filed Weights   11/03/19 1036  Weight: 102 lb 11.2 oz (46.6 kg)    BREAST: No palpable masses or nodules in either right or left breasts. No palpable axillary supraclavicular or infraclavicular adenopathy no breast tenderness or nipple discharge. (exam performed in the presence of a chaperone)  LABORATORY DATA:  I have reviewed the data as listed CMP Latest Ref Rng & Units 04/17/2019 03/29/2019 03/29/2019  Glucose 70 - 99 mg/dL 109(H) 127(H) 101(H)  BUN 8 - 23 mg/dL '10 13 13  ' Creatinine 0.44 - 1.00 mg/dL 0.64 0.67 0.65  Sodium 135 - 145 mmol/L 123(L) 130(L) 132(L)  Potassium 3.5 - 5.1 mmol/L 4.2 4.3 4.4  Chloride 98 - 111 mmol/L 84(L) 94(L) 96(L)  CO2 22 - 32 mmol/L  '27 26 27  ' Calcium 8.9 - 10.3 mg/dL 10.2 9.3 9.8  Total Protein 6.5 - 8.1 g/dL - - 7.6  Total Bilirubin 0.3 - 1.2 mg/dL - - 0.8  Alkaline Phos 38 - 126 U/L - - 65  AST 15 - 41 U/L - - 18  ALT 0 - 44 U/L - - 15    Lab Results  Component Value Date   WBC 11.3 (H) 04/17/2019   HGB 12.1 04/17/2019   HCT 33.2 (L) 04/17/2019   MCV 96.0 04/17/2019   PLT 318 04/17/2019   NEUTROABS 10.7 (H) 03/28/2019    ASSESSMENT & PLAN:  Breast cancer of upper-outer quadrant of left female breast ([place]) Left breast biopsy 04/12/2016: IDC grade 2-3, left axillary LN positive for metastatic IDC; mammogram revealed a 5 cm solid mass left breast 12:00 anterior depth. ER 0%, PR 0%, Ki-67 80%, HER-2 positive ratio 7.86, gene copy #16.5  Breast MRI: 05/16/2016:Left breast solid with cystic appearance OUQ 1:00 crossing into the medial left breast 4.7 x 3.3 x 4.2 cm; second 64m nodule left breast  UOQ: Posterior intramammary/low axillary lymph node, indeterminate left axillary lymph nodes, skin thickening Bone scan and CT chest abdomen pelvis 05/10/2016: No metastatic disease Clinical stage: T2 N1 M0 stage IIB  Left modified radical mastectomy 10/18/2016: Complete response to treatment 0/9 lymph nodes negative ypT0ypN0  Treatment summary: 1. Neoadjuvant chemotherapy with Taxol Herceptin Perjeta. Taxol to be given weekly 12, Herceptin Perjeta to be given every 3 weeks 6 cycles 05/17/16 to 08/29/16 2. Mastectomy with sentinel lymph node biopsy/selective axillary node dissection 10/18/2016: Complete pathologic response 3. Decided against doing radiation given her advanced age. 4.Maintenance Herceptin Perjeta every 3 weeks completed end of August 2018 ------------------------------------------------------------------------------------------------------------------- Breast cancer surveillance: 1.  Right breast mammogram 10/09/2019: No evidence of malignancy. 2.  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 SIsidore Moos8/11/2018-06/02/2019 (required PEG tube) PET CT scan 10/01/2019: Base of tongue mass is no longer visualized.  Based on the PET scans patient is in remission. She will follow with radiation oncology and ENT for her surveillance checkups.  Return to clinic in 1 year for follow-up  No orders of the defined types were placed in this encounter.  The patient has a good understanding of the overall plan. she agrees with it. she will call with any problems that may develop before the next visit here.  Total time spent: 20 mins including face to face time and time spent for planning, charting and coordination of care  GNicholas Lose MD 11/03/2019  I, MCloyde ReamsDorshimer, am acting as scribe for Dr. VNicholas Lose  I have reviewed the above  documentation for accuracy and completeness, and I agree with the above.

## 2019-11-03 ENCOUNTER — Other Ambulatory Visit: Payer: Self-pay

## 2019-11-03 ENCOUNTER — Inpatient Hospital Stay: Payer: PPO | Attending: Hematology and Oncology | Admitting: Hematology and Oncology

## 2019-11-03 ENCOUNTER — Other Ambulatory Visit: Payer: Self-pay | Admitting: Hematology and Oncology

## 2019-11-03 DIAGNOSIS — Z171 Estrogen receptor negative status [ER-]: Secondary | ICD-10-CM | POA: Insufficient documentation

## 2019-11-03 DIAGNOSIS — C01 Malignant neoplasm of base of tongue: Secondary | ICD-10-CM

## 2019-11-03 DIAGNOSIS — Z9221 Personal history of antineoplastic chemotherapy: Secondary | ICD-10-CM | POA: Diagnosis not present

## 2019-11-03 DIAGNOSIS — Z9012 Acquired absence of left breast and nipple: Secondary | ICD-10-CM | POA: Insufficient documentation

## 2019-11-03 DIAGNOSIS — Z853 Personal history of malignant neoplasm of breast: Secondary | ICD-10-CM | POA: Insufficient documentation

## 2019-11-03 DIAGNOSIS — Z79899 Other long term (current) drug therapy: Secondary | ICD-10-CM | POA: Diagnosis not present

## 2019-11-03 DIAGNOSIS — C50412 Malignant neoplasm of upper-outer quadrant of left female breast: Secondary | ICD-10-CM

## 2019-11-03 DIAGNOSIS — I10 Essential (primary) hypertension: Secondary | ICD-10-CM

## 2019-11-03 NOTE — Assessment & Plan Note (Signed)
Left breast biopsy 04/12/2016: IDC grade 2-3, left axillary LN positive for metastatic IDC; mammogram revealed a 5 cm solid mass left breast 12:00 anterior depth. ER 0%, PR 0%, Ki-67 80%, HER-2 positive ratio 7.86, gene copy #16.5  Breast MRI: 05/16/2016:Left breast solid with cystic appearance OUQ 1:00 crossing into the medial left breast 4.7 x 3.3 x 4.2 cm; second 51m nodule left breast UOQ: Posterior intramammary/low axillary lymph node, indeterminate left axillary lymph nodes, skin thickening Bone scan and CT chest abdomen pelvis 05/10/2016: No metastatic disease Clinical stage: T2 N1 M0 stage IIB  Left modified radical mastectomy 10/18/2016: Complete response to treatment 0/9 lymph nodes negative ypT0ypN0  Treatment summary: 1. Neoadjuvant chemotherapy with Taxol Herceptin Perjeta. Taxol to be given weekly 12, Herceptin Perjeta to be given every 3 weeks 6 cycles 05/17/16 to 08/29/16 2. Mastectomy with sentinel lymph node biopsy/selective axillary node dissection 10/18/2016: Complete pathologic response 3. Decided against doing radiation given her advanced age. 4.Maintenance Herceptin Perjeta every 3 weeks completed end of August 2018 ------------------------------------------------------------------------------------------------------------------- Breast cancer surveillance: 1.  Right breast mammogram 10/09/2019: No evidence of malignancy. 2.  Breast exam 11/03/2019: Benign

## 2019-11-03 NOTE — Assessment & Plan Note (Deleted)
Left breast biopsy 04/12/2016: IDC grade 2-3, left axillary LN positive for metastatic IDC; mammogram revealed a 5 cm solid mass left breast 12:00 anterior depth. ER 0%, PR 0%, Ki-67 80%, HER-2 positive ratio 7.86, gene copy #16.5  Breast MRI: 05/16/2016:Left breast solid with cystic appearance OUQ 1:00 crossing into the medial left breast 4.7 x 3.3 x 4.2 cm; second 1m nodule left breast UOQ: Posterior intramammary/low axillary lymph node, indeterminate left axillary lymph nodes, skin thickening Bone scan and CT chest abdomen pelvis 05/10/2016: No metastatic disease Clinical stage: T2 N1 M0 stage IIB  Left modified radical mastectomy 10/18/2016: Complete response to treatment 0/9 lymph nodes negative ypT0ypN0  Treatment summary: 1. Neoadjuvant chemotherapy with Taxol Herceptin Perjeta. Taxol to be given weekly 12, Herceptin Perjeta to be given every 3 weeks 6 cycles 05/17/16 to 08/29/16 2. Mastectomy with sentinel lymph node biopsy/selective axillary node dissection 10/18/2016: Complete pathologic response 3. Decided against doing radiation given her advanced age. 4.Maintenance Herceptin Perjeta every 3 weeks completed end of August 2018 ------------------------------------------------------------------------------------------------------------------- Breast cancer surveillance: 1.  Right breast mammogram 10/09/2019: No evidence of malignancy. 2.  Breast exam 11/03/2019: Benign

## 2019-11-03 NOTE — Assessment & Plan Note (Signed)
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 Isidore Moos 04/14/2019-06/02/2019 (required PEG tube) PET CT scan 10/01/2019: Base of tongue mass is no longer visualized.  Based on the PET scans patient is in remission. She will follow with radiation oncology and ENT for her surveillance checkups.

## 2019-11-04 ENCOUNTER — Telehealth: Payer: Self-pay | Admitting: Hematology and Oncology

## 2019-11-04 NOTE — Telephone Encounter (Signed)
I talk with patient regarding schedule  

## 2019-11-10 ENCOUNTER — Ambulatory Visit: Payer: PPO | Attending: Internal Medicine

## 2019-11-10 DIAGNOSIS — Z23 Encounter for immunization: Secondary | ICD-10-CM

## 2019-11-10 NOTE — Progress Notes (Signed)
   Covid-19 Vaccination Clinic  Name:  Jessica Sherman    MRN: [id number] DOB: [birth year]  11/10/2019  Ms. Mcgurl was observed post Covid-19 immunization for 15 minutes without incidence. She was provided with Vaccine Information Sheet and instruction to access the V-Safe system.   Ms. Alon was instructed to call 911 with any severe reactions post vaccine: Marland Kitchen Difficulty breathing  . Swelling of your face and throat  . A fast heartbeat  . A bad rash all over your body  . Dizziness and weakness    Immunizations Administered    Name Date Dose VIS Date Route   Pfizer COVID-19 Vaccine 11/10/2019  2:36 PM 0.3 mL 08/22/2019 Intramuscular   Manufacturer: [place]   Lot: [id number]   [place]: [id number]

## 2019-11-12 ENCOUNTER — Telehealth: Payer: Self-pay | Admitting: *Deleted

## 2019-11-12 NOTE — Telephone Encounter (Signed)
Oncology Nurse Navigator Documentation  In support of appointment compliance for H&N patient follow-up with SLP Garald Balding, called patient, confirmed understanding of tomorrow's    1:15 appointment at [facility], encouraged arrival 15-20 minutes prior for registration followed by arrival to Radiation Waiting.  She voiced understanding.  Gayleen Orem, RN, BSN Head & Neck Oncology Nurse [place] [facility] [place] [phone]

## 2019-11-13 ENCOUNTER — Other Ambulatory Visit: Payer: Self-pay

## 2019-11-13 ENCOUNTER — Ambulatory Visit: Payer: PPO | Attending: Radiation Oncology

## 2019-11-13 DIAGNOSIS — R1310 Dysphagia, unspecified: Secondary | ICD-10-CM | POA: Diagnosis not present

## 2019-11-13 NOTE — Patient Instructions (Signed)
If you should have trouble swallowing things you have not had difficulty with, contact your PCP or your ENT [place] Baseman).

## 2019-11-13 NOTE — Therapy (Signed)
related to goals /  functional outcomes: Pt will have LTGs enforced today for final ST appointment.  See goals above. Pt completion of HEP was suboptimal, however she completed HEP without cues on her last visit.   Remaining deficits: None noted today.   Education / Equipment: HEP procedure, late effects head/neck radiation on swallow function, who to contact should she develop dysphagia in the future.   Plan: Patient agrees to discharge.  Patient goals were partially met. Patient is being discharged due to being pleased with the current functional level.  ?????       Problem List Patient Active Problem List   Diagnosis Date Noted  . Depression 05/22/2019  . Anxiety 04/04/2019  . Hyponatremia 03/28/2019  . Dehydration 03/28/2019  . Cancer of base of tongue ([place]) 03/24/2019  . Oropharyngeal dysphagia 03/14/2019  . Dysphasia 01/27/2019  . Hypertension 12/13/2016  . S/P mastectomy, left 10/18/2016  . Breast cancer ([place]) 10/18/2016  . Port catheter in place 05/23/2016  . Breast cancer of upper-outer quadrant of left female breast ([place]) 04/30/2016  . Conductive hearing loss in right ear 12/09/2015  . Enteritis 05/30/2013  . Prediabetes   . Macular degeneration, dry   . Vertigo   . Vitamin D deficiency 11/17/2008  . COLONIC POLYPS, HX OF 11/17/2008  . Hypothyroidism 11/19/2007  . HYPERLIPIDEMIA 11/19/2007  . Osteopenia 02/18/2007    Kailen Hinkle ,MS, CCC-SLP  11/13/2019, 1:25 PM  [facility] [address] [place], [place], [address] Phone: [phone]   Fax:  [phone]   Name: KYRIANNA BARLETTA MRN: [id number] Date of Birth: 19-Jan-1931  [facility] [address] [place], [place], [address] Phone: [phone]   Fax:  [phone]  Speech Language Pathology Treatment [facility]  Patient Details  Name: ZILPHIA KOZINSKI MRN: [id number] Date of Birth: [birth year] Referring Provider (SLP): [name], MD   Encounter Date: 11/13/2019  End of Session - 11/13/19 1324    Visit Number  5    Number of Visits  6    Date for SLP Re-Evaluation  11/13/19    SLP Start Time  4825    SLP Stop Time   1320    SLP Time Calculation (min)  25 min    Activity Tolerance  Patient tolerated treatment well       Past Medical History:  Diagnosis Date  . Hx of colonic polyps   . Hyperlipidemia   . Hypothyroidism   . Macular degeneration, dry   . Osteopenia    spine BMD T score - 1.69  . PONV (postoperative nausea and vomiting)    pt has vertigo and can not lie flat  . Prediabetes    FBS 136  on 08/29/2010; A1c 6.2%  . Vertigo     Past Surgical History:  Procedure Laterality Date  . CATARACT EXTRACTION  2009   OS  . COLONOSCOPY W/ POLYPECTOMY     X 2; last 2012. Dr Watt Climes  . IR GASTROSTOMY TUBE MOD SED  04/17/2019  . IR GASTROSTOMY TUBE REMOVAL  08/29/2019  . IR GENERIC HISTORICAL  05/09/2016   IR US GUIDE VASC ACCESS RIGHT 05/09/2016 Jacqulynn Cadet, MD [facility]  . IR GENERIC HISTORICAL  05/09/2016   IR FLUORO GUIDE CV LINE RIGHT 05/09/2016 Jacqulynn Cadet, MD [facility]  . MODIFIED MASTECTOMY Left 10/18/2016   Procedure: LEFT MODIFIED RADICAL MASTECTOMY;  Surgeon: [name], MD;  Location: [facility];  Service: General;  Laterality: Left;  . PILONIDAL CYST EXCISION     X 3  . TONSILLECTOMY AND ADENOIDECTOMY    . TOTAL ABDOMINAL HYSTERECTOMY     w/ bladder repair    There were no vitals filed for this visit.  Subjective Assessment - 11/13/19 1307    Subjective  Pt reports no incr in difficulty of swallowing.    Currently in  Pain?  No/denies            ADULT SLP TREATMENT - 11/13/19 1307      General Information   Behavior/Cognition  Alert;Cooperative;Pleasant mood      Treatment Provided   Treatment provided  Dysphagia      Dysphagia Treatment   Temperature Spikes Noted  No    Respiratory Status  Room air    Patient observed directly with PO's  Yes    Type of PO's observed  Dysphagia 3 (soft);Thin liquids    Oral Phase Signs & Symptoms  --   No overt s/sx difficulty   Pharyngeal Phase Signs & Symptoms  --   no overt s/sx today   Other treatment/comments  Pt's HEP procedure was WNL. Pt told SLP last appointment that she would complete HEP x5/week - reports she "still sort of slacked off, Henretter Piekarski." Pt told SLP she reduced her frequency of HEP in May (correct). Since pt was without overt s/sx aspiration with PO's, pt is appropriate for d/c. She agrees.       Assessment / Recommendations / Plan   Plan  Discharge SLP treatment due to (comment)   pleased with progress     Dysphagia Recommendations  [facility] [address] [place], [place], [address] Phone: [phone]   Fax:  [phone]  Speech Language Pathology Treatment [facility]  Patient Details  Name: ZILPHIA KOZINSKI MRN: [id number] Date of Birth: [birth year] Referring Provider (SLP): [name], MD   Encounter Date: 11/13/2019  End of Session - 11/13/19 1324    Visit Number  5    Number of Visits  6    Date for SLP Re-Evaluation  11/13/19    SLP Start Time  4825    SLP Stop Time   1320    SLP Time Calculation (min)  25 min    Activity Tolerance  Patient tolerated treatment well       Past Medical History:  Diagnosis Date  . Hx of colonic polyps   . Hyperlipidemia   . Hypothyroidism   . Macular degeneration, dry   . Osteopenia    spine BMD T score - 1.69  . PONV (postoperative nausea and vomiting)    pt has vertigo and can not lie flat  . Prediabetes    FBS 136  on 08/29/2010; A1c 6.2%  . Vertigo     Past Surgical History:  Procedure Laterality Date  . CATARACT EXTRACTION  2009   OS  . COLONOSCOPY W/ POLYPECTOMY     X 2; last 2012. Dr Watt Climes  . IR GASTROSTOMY TUBE MOD SED  04/17/2019  . IR GASTROSTOMY TUBE REMOVAL  08/29/2019  . IR GENERIC HISTORICAL  05/09/2016   IR US GUIDE VASC ACCESS RIGHT 05/09/2016 Jacqulynn Cadet, MD [facility]  . IR GENERIC HISTORICAL  05/09/2016   IR FLUORO GUIDE CV LINE RIGHT 05/09/2016 Jacqulynn Cadet, MD [facility]  . MODIFIED MASTECTOMY Left 10/18/2016   Procedure: LEFT MODIFIED RADICAL MASTECTOMY;  Surgeon: [name], MD;  Location: [facility];  Service: General;  Laterality: Left;  . PILONIDAL CYST EXCISION     X 3  . TONSILLECTOMY AND ADENOIDECTOMY    . TOTAL ABDOMINAL HYSTERECTOMY     w/ bladder repair    There were no vitals filed for this visit.  Subjective Assessment - 11/13/19 1307    Subjective  Pt reports no incr in difficulty of swallowing.    Currently in  Pain?  No/denies            ADULT SLP TREATMENT - 11/13/19 1307      General Information   Behavior/Cognition  Alert;Cooperative;Pleasant mood      Treatment Provided   Treatment provided  Dysphagia      Dysphagia Treatment   Temperature Spikes Noted  No    Respiratory Status  Room air    Patient observed directly with PO's  Yes    Type of PO's observed  Dysphagia 3 (soft);Thin liquids    Oral Phase Signs & Symptoms  --   No overt s/sx difficulty   Pharyngeal Phase Signs & Symptoms  --   no overt s/sx today   Other treatment/comments  Pt's HEP procedure was WNL. Pt told SLP last appointment that she would complete HEP x5/week - reports she "still sort of slacked off, Henretter Piekarski." Pt told SLP she reduced her frequency of HEP in May (correct). Since pt was without overt s/sx aspiration with PO's, pt is appropriate for d/c. She agrees.       Assessment / Recommendations / Plan   Plan  Discharge SLP treatment due to (comment)   pleased with progress     Dysphagia Recommendations

## 2019-11-17 ENCOUNTER — Telehealth: Payer: Self-pay | Admitting: Internal Medicine

## 2019-11-17 NOTE — Chronic Care Management (AMB) (Signed)
  Chronic Care Management   Note  11/17/2019 Name: Jessica Sherman MRN: [id number] DOB: Nov 25, 1930  Jessica Sherman is a 86 y.o. year old female who is a primary care patient of Burns, Claudina Lick, MD. I reached out to [name] by phone today in response to a referral sent by [name] PCP, [name], MD.   Ms. Border was given information about Chronic Care Management services today including:  1. CCM service includes personalized support from designated clinical staff supervised by her physician, including individualized plan of care and coordination with other care providers 2. 24/7 contact phone numbers for assistance for urgent and routine care needs. 3. Service will only be billed when office clinical staff spend 20 minutes or more in a month to coordinate care. 4. Only one practitioner may furnish and bill the service in a calendar month. 5. The patient may stop CCM services at any time (effective at the end of the month) by phone call to the office staff.   Patient agreed to services and verbal consent obtained.   Follow up plan:   Raynicia Dukes [employer] Scheduler

## 2019-11-20 ENCOUNTER — Ambulatory Visit: Payer: PPO | Admitting: Internal Medicine

## 2019-11-25 NOTE — Patient Instructions (Addendum)
  Blood work was ordered.     Medications reviewed and updated.  Changes include :   None     Please followup in 6 months   

## 2019-11-25 NOTE — Progress Notes (Signed)
Subjective:    Patient ID: Jessica Sherman, female    DOB: 1931/05/16, 86 y.o.   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   She is very active, but not exercising regularly.     She has completed radiation for the cancer at the base of her tongue and is currently in remission.  She feels she is still recovering.  Energy level improving.  Swallowing difficulty with meat - has to have small pieces.  Can swallow most pills.  She did do speech therapy and feels overall everything is improving.  Appetite is good.  Taste is improving.  She is eating a lot of veges.  She eats ice cream to, but tries not to overdo it.  Medications and allergies reviewed with patient and updated if appropriate.  Patient Active Problem List   Diagnosis Date Noted  . Hyponatremia 03/28/2019  . Dehydration 03/28/2019  . Cancer of base of tongue ([place]) 03/24/2019  . Oropharyngeal dysphagia 03/14/2019  . Dysphasia 01/27/2019  . Hypertension 12/13/2016  . S/P mastectomy, left 10/18/2016  . Breast cancer ([place]) 10/18/2016  . Port catheter in place 05/23/2016  . Breast cancer of upper-outer quadrant of left female breast ([place]) 04/30/2016  . Conductive hearing loss in right ear 12/09/2015  . Enteritis 05/30/2013  . Prediabetes   . Macular degeneration, dry   . Vertigo   . Vitamin D deficiency 11/17/2008  . COLONIC POLYPS, HX OF 11/17/2008  . Hypothyroidism 11/19/2007  . HYPERLIPIDEMIA 11/19/2007  . Osteopenia 02/18/2007    Current Outpatient Medications on File Prior to Visit  Medication Sig Dispense Refill  . amLODipine (NORVASC) 5 MG tablet TAKE 1 TABLET(5 MG) BY MOUTH DAILY 90 tablet 3  . Calcium Carb-Cholecalciferol (CALCIUM 600+D3 PO) Take 1 tablet by mouth 2 (two) times daily.      . Cholecalciferol (VITAMIN D3) 1000 UNITS CAPS Take 1,000 Units by mouth daily.     Marland Kitchen levothyroxine (SYNTHROID) 88 MCG tablet TAKE 1 TABLET BY MOUTH EVERY DAY EXCEPT ON WEDNESDAY TAKE 1/2 TABLET 90 tablet 1  . Multiple Vitamins-Minerals (PRESERVISION AREDS PO) Take by mouth.    . simvastatin (ZOCOR) 20 MG tablet Take 1 tablet (20 mg total) by mouth at bedtime. 90 tablet 1  . sodium fluoride (PREVIDENT 5000 PLUS) 1.1 % CREA dental cream Place gel into fluoride tray at bedtime.  Place over teeth for 5 minutes.  Remove.  Spit out excess.  Repeat nightly. 1 Tube prn  . [DISCONTINUED] prochlorperazine (COMPAZINE) 10 MG tablet Take 1 tablet (10 mg total) by mouth every 6 (six) hours as needed (Nausea or vomiting). (Patient not taking: Reported on 07/27/2016) 30 tablet 1   No current facility-administered medications on file prior to visit.    Past Medical History:  Diagnosis Date  . Hx of colonic polyps   . Hyperlipidemia   . Hypothyroidism   . Macular degeneration, dry   . Osteopenia    spine BMD T score - 1.69  . PONV (postoperative nausea and vomiting)    pt has vertigo and can not lie flat  . Prediabetes    FBS 136  on 08/29/2010; A1c 6.2%  . Vertigo     Past Surgical History:  Procedure Laterality Date  . CATARACT EXTRACTION  2009   OS  .  COLONOSCOPY W/ POLYPECTOMY     X 2; last 2012. Dr Watt Climes  . IR GASTROSTOMY TUBE MOD SED  04/17/2019  . IR GASTROSTOMY TUBE REMOVAL  08/29/2019  . IR GENERIC HISTORICAL  05/09/2016   IR US GUIDE VASC ACCESS RIGHT 05/09/2016 Jacqulynn Cadet, MD [facility]  . IR GENERIC HISTORICAL  05/09/2016   IR FLUORO GUIDE CV LINE RIGHT 05/09/2016 Jacqulynn Cadet, MD [facility]  . MODIFIED MASTECTOMY Left 10/18/2016   Procedure: LEFT MODIFIED RADICAL MASTECTOMY;  Surgeon: [name], MD;  Location: [facility];  Service: General;  Laterality: Left;  . PILONIDAL CYST EXCISION     X 3  . TONSILLECTOMY AND ADENOIDECTOMY    . TOTAL  ABDOMINAL HYSTERECTOMY     w/ bladder repair    Social History   Socioeconomic History  . Marital status: Widowed    Spouse name: Not on file  . Number of children: 4  . Years of education: Not on file  . Highest education level: Not on file  Occupational History  . Occupation: retired  Tobacco Use  . Smoking status: Never Smoker  . Smokeless tobacco: Never Used  Substance and Sexual Activity  . Alcohol use: No    Comment:    . Drug use: No  . Sexual activity: Not on file  Other Topics Concern  . Not on file  Social History Narrative   Regular exercise- yes 27mpd w/o symptoms   Social Determinants of Health   Financial Resource Strain:   . Difficulty of Paying Living Expenses:   Food Insecurity:   . Worried About Charity fundraiser in the Last Year:   . Arboriculturist in the Last Year:   Transportation Needs: No Transportation Needs  . Lack of Transportation (Medical): No  . Lack of Transportation (Non-Medical): No  Physical Activity:   . Days of Exercise per Week:   . Minutes of Exercise per Session:   Stress:   . Feeling of Stress :   Social Connections:   . Frequency of Communication with Friends and Family:   . Frequency of Social Gatherings with Friends and Family:   . Attends Religious Services:   . Active Member of Clubs or Organizations:   . Attends Archivist Meetings:   Marland Kitchen Marital Status:     Family History  Problem Relation Age of Onset  . Osteoporosis Mother   . Leukemia Mother   . Cancer Maternal Uncle        ? stomach  . Aneurysm Father        AAA  . Heart attack Neg Hx     Review of Systems  Constitutional: Negative for chills and fever.  Respiratory: Negative for cough, shortness of breath and wheezing.   Cardiovascular: Negative for chest pain, palpitations and leg swelling.  Neurological: Positive for dizziness (occ). Negative for light-headedness and headaches.       Objective:   Vitals:   11/26/19 1005  BP: (!)  158/62  Pulse: (!) 50  Resp: 16  Temp: 98.2 F (36.8 C)  SpO2: 99%   BP Readings from Last 3 Encounters:  11/26/19 (!) 158/62  11/03/19 (!) 161/53  07/08/19 (!) 152/58   Wt Readings from Last 3 Encounters:  11/26/19 102 lb 12.8 oz (46.6 kg)  11/03/19 102 lb 11.2 oz (46.6 kg)  07/08/19 101 lb (45.8 kg)   Body mass index is 18.21 kg/m.   Physical Exam    Constitutional: Thin,  slightly cachectic appearing. No distress.  HENT:  Head: Normocephalic and atraumatic.  Neck: Neck supple. No tracheal deviation present. No thyromegaly present.  No cervical lymphadenopathy Cardiovascular: Normal rate, regular rhythm and normal heart sounds.   No murmur heard. No carotid bruit .  No edema Pulmonary/Chest: Effort normal and breath sounds normal. No respiratory distress. No has no wheezes. No rales.  Skin: Skin is warm and dry. Not diaphoretic.  Psychiatric: Normal mood and affect. Behavior is normal.      Assessment & Plan:    See Problem List for Assessment and Plan of chronic medical problems.    This visit occurred during the SARS-CoV-2 public health emergency.  Safety protocols were in place, including screening questions prior to the visit, additional usage of staff PPE, and extensive cleaning of exam room while observing appropriate contact time as indicated for disinfecting solutions.

## 2019-11-26 ENCOUNTER — Encounter: Payer: Self-pay | Admitting: Internal Medicine

## 2019-11-26 ENCOUNTER — Ambulatory Visit (INDEPENDENT_AMBULATORY_CARE_PROVIDER_SITE_OTHER): Payer: PPO | Admitting: Internal Medicine

## 2019-11-26 ENCOUNTER — Other Ambulatory Visit: Payer: Self-pay

## 2019-11-26 VITALS — BP 158/62 | HR 50 | Temp 98.2°F | Resp 16 | Ht 63.0 in | Wt 102.8 lb

## 2019-11-26 DIAGNOSIS — E782 Mixed hyperlipidemia: Secondary | ICD-10-CM

## 2019-11-26 DIAGNOSIS — R7303 Prediabetes: Secondary | ICD-10-CM

## 2019-11-26 DIAGNOSIS — I10 Essential (primary) hypertension: Secondary | ICD-10-CM

## 2019-11-26 DIAGNOSIS — E039 Hypothyroidism, unspecified: Secondary | ICD-10-CM | POA: Diagnosis not present

## 2019-11-26 LAB — CBC WITH DIFFERENTIAL/PLATELET
Basophils Absolute: 0 10*3/uL (ref 0.0–0.1)
Basophils Relative: 0.5 % (ref 0.0–3.0)
Eosinophils Absolute: 0 10*3/uL (ref 0.0–0.7)
Eosinophils Relative: 0.9 % (ref 0.0–5.0)
HCT: 34.4 % — ABNORMAL LOW (ref 36.0–46.0)
Hemoglobin: 12.1 g/dL (ref 12.0–15.0)
Lymphocytes Relative: 14 % (ref 12.0–46.0)
Lymphs Abs: 0.7 10*3/uL (ref 0.7–4.0)
MCHC: 35.2 g/dL (ref 30.0–36.0)
MCV: 99.6 fl (ref 78.0–100.0)
Monocytes Absolute: 0.7 10*3/uL (ref 0.1–1.0)
Monocytes Relative: 14 % — ABNORMAL HIGH (ref 3.0–12.0)
Neutro Abs: 3.5 10*3/uL (ref 1.4–7.7)
Neutrophils Relative %: 70.6 % (ref 43.0–77.0)
Platelets: 217 10*3/uL (ref 150.0–400.0)
RBC: 3.46 Mil/uL — ABNORMAL LOW (ref 3.87–5.11)
RDW: 12.6 % (ref 11.5–15.5)
WBC: 5 10*3/uL (ref 4.0–10.5)

## 2019-11-26 LAB — LIPID PANEL
Cholesterol: 145 mg/dL (ref 0–200)
HDL: 65.4 mg/dL (ref >39.00)
LDL Cholesterol: 66 mg/dL (ref 0–99)
NonHDL: 79.89
Total CHOL/HDL Ratio: 2
Triglycerides: 71 mg/dL (ref 0.0–149.0)
VLDL: 14.2 mg/dL (ref 0.0–40.0)

## 2019-11-26 LAB — COMPREHENSIVE METABOLIC PANEL
ALT: 18 U/L (ref 0–35)
AST: 21 U/L (ref 0–37)
Albumin: 3.9 g/dL (ref 3.5–5.2)
Alkaline Phosphatase: 63 U/L (ref 39–117)
BUN: 13 mg/dL (ref 6–23)
CO2: 31 mEq/L (ref 19–32)
Calcium: 9.8 mg/dL (ref 8.4–10.5)
Chloride: 98 mEq/L (ref 96–112)
Creatinine, Ser: 0.73 mg/dL (ref 0.40–1.20)
GFR: 75.38 mL/min (ref >60.00)
Glucose, Bld: 102 mg/dL — ABNORMAL HIGH (ref 70–99)
Potassium: 4.2 mEq/L (ref 3.5–5.1)
Sodium: 135 mEq/L (ref 135–145)
Total Bilirubin: 0.4 mg/dL (ref 0.2–1.2)
Total Protein: 7.2 g/dL (ref 6.0–8.3)

## 2019-11-26 LAB — TSH: TSH: 0.15 u[IU]/mL — ABNORMAL LOW (ref 0.35–4.50)

## 2019-11-26 LAB — HEMOGLOBIN A1C: Hgb A1c MFr Bld: 5.9 % (ref 4.6–6.5)

## 2019-11-26 NOTE — Assessment & Plan Note (Signed)
Chronic  Clinically euthyroid Check tsh  Titrate med dose if needed  

## 2019-11-26 NOTE — Assessment & Plan Note (Signed)
Chronic Check a1c Low sugar / carb diet Stressed continued increased activity, exercise

## 2019-11-26 NOTE — Assessment & Plan Note (Signed)
Chronic Check lipid panel  Continue daily statin Regular exercise and healthy diet encouraged  

## 2019-11-26 NOTE — Assessment & Plan Note (Signed)
Chronic Blood pressure slightly elevated here today, but given her age I do not want to add any medication that could cause hypotension Continue amlodipine 5 mg daily and monitor CMP

## 2019-11-27 ENCOUNTER — Other Ambulatory Visit: Payer: Self-pay | Admitting: Internal Medicine

## 2019-11-27 MED ORDER — LEVOTHYROXINE SODIUM 75 MCG PO TABS: 75.0000 ug | ORAL_TABLET | Freq: Every day | ORAL | 1 refills | Status: DC

## 2019-11-27 NOTE — Addendum Note (Signed)
Addended by: [name] E on: 11/27/2019 11:19 AM   Modules accepted: Orders

## 2019-12-02 ENCOUNTER — Ambulatory Visit: Payer: PPO | Admitting: Pharmacist

## 2019-12-02 ENCOUNTER — Other Ambulatory Visit: Payer: Self-pay

## 2019-12-02 DIAGNOSIS — I10 Essential (primary) hypertension: Secondary | ICD-10-CM

## 2019-12-02 DIAGNOSIS — E039 Hypothyroidism, unspecified: Secondary | ICD-10-CM

## 2019-12-02 DIAGNOSIS — E782 Mixed hyperlipidemia: Secondary | ICD-10-CM

## 2019-12-02 NOTE — Chronic Care Management (AMB) (Signed)
Chronic Care Management Pharmacy  Name: Jessica Sherman  MRN: [id number] DOB: March 01, 1931  Chief Complaint/ HPI  Jessica Sherman,  86 y.o. , female presents for their Initial CCM visit with the clinical pharmacist via telephone due to COVID-19 Pandemic.  PCP : [name], MD  Their chronic conditions include: HTN, HLD, hypothyroidism, prediabetes, hx breast cancer and tongue cancer, osteopenia  Pt has large family, children grandchildren and great-grandchildren all in [place] area. Pt is quite active, still driving herself, shopping for herself. Biggest issue is swallowing.   Office Visits: 11/26/19 Dr Quay Burow: BP slightly elevated in office, did not add med d/t her age d/t risk for hypotension. No med changes.  Consult Visit: 11/13/19 SLP: HEP procedure WNL, rec'd small bites/sips for dysphagia.  11/03/19 Dr Lindi Adie (heme/onc): breast ca treated 2017-2018, no evidence of recurrence. Tongue cancer treated summer 2020.   Medications: Outpatient Encounter Medications as of 12/02/2019  Medication Sig Note  . amLODipine (NORVASC) 5 MG tablet TAKE 1 TABLET(5 MG) BY MOUTH DAILY   . Cholecalciferol (VITAMIN D3) 1000 UNITS CAPS Take 1,000 Units by mouth daily.    Marland Kitchen levothyroxine (SYNTHROID) 75 MCG tablet TAKE 1 TABLET(75 MCG) BY MOUTH DAILY   . Multiple Vitamins-Minerals (PRESERVISION AREDS PO) Take by mouth.   . simvastatin (ZOCOR) 20 MG tablet Take 1 tablet (20 mg total) by mouth at bedtime.   . sodium fluoride (PREVIDENT 5000 PLUS) 1.1 % CREA dental cream Place gel into fluoride tray at bedtime.  Place over teeth for 5 minutes.  Remove.  Spit out excess.  Repeat nightly.   . Calcium Carb-Cholecalciferol (CALCIUM 600+D3 PO) Take 1 tablet by mouth 2 (two) times daily. 12/02/2019: Patient cannot swallow large pill right now  . [DISCONTINUED] prochlorperazine (COMPAZINE) 10 MG tablet Take 1 tablet (10 mg total) by mouth every 6 (six) hours as needed (Nausea or vomiting). (Patient not  taking: Reported on 07/27/2016)    No facility-administered encounter medications on file as of 12/02/2019.     Current Diagnosis/Assessment:  Goals Addressed            This Visit's Progress   . Phamacy Care Plan       CARE PLAN ENTRY  Current Barriers:  . Chronic Disease Management support, education, and care coordination needs related to HTN and HLD  Pharmacist Clinical Goal(s):  Marland Kitchen Maintain BP < 150/90 . Maintain LDL < 70 . Ensure safety, efficacy, and affordability of medications  Interventions: . Comprehensive medication review performed. . Recommend calcium gummy (NatureMade, VitaFusion, or store brand) . Recommend chewable version of Preservision   Patient Self Care Activities:  . Self administers medications as prescribed, Calls pharmacy for medication refills, and Calls provider office for new concerns or questions  Initial goal documentation       Hypertension   Office blood pressures are  BP Readings from Last 3 Encounters:  11/26/19 (!) 158/62  11/03/19 (!) 161/53  07/08/19 (!) 152/58   Patient has failed these meds in the past: metoprolol, Patient is currently uncontrolled on the following medications: amlodipine 5 mg daily  Patient checks BP at home infrequently  Patient home BP readings are ranging: n/a  We discussed diet and exercise extensively. Pt is active, runs errands and cleans the house. She endorses healthy diet of fruit, veggies, fish, chicken, very little red meat. She does not check BP, we discussed given her age her BP goal is < 150/90.   Plan  Continue current medications and control  with diet and exercise  Recommend check BP at home several times per month   Hyperlipidemia   Lipid Panel     Component Value Date/Time   CHOL 145 11/26/2019 1048   TRIG 71.0 11/26/2019 1048   HDL 65.40 11/26/2019 1048   CHOLHDL 2 11/26/2019 1048   VLDL 14.2 11/26/2019 1048   LDLCALC 66 11/26/2019 1048   LDLDIRECT 57.0 01/27/2019 1424      The ASCVD Risk score (Goff DC Jr., et al., 2013) failed to calculate for the following reasons:   The 2013 ASCVD risk score is only valid for ages 1 to 82   Patient has failed these meds in past: n/a Patient is currently controlled on the following medications: simvastatin 20 mg HS  We discussed:  diet and exercise extensively, cholesterol has been controlled on this dose, pt does not endorse issues with statin.  Plan  Continue current medications and control with diet and exercise   Hypothyroidism   TSH  Date Value Ref Range Status  11/26/2019 0.15 (L) 0.35 - 4.50 uIU/mL Final    Patient has failed these meds in past: n/a Patient is currently uncontrolled on the following medications: levothyroxine 75 mcg daily, 1/2 tablet on Wednesdays  We discussed: Her dose was decreased last week from 88 mcg to 75 mcg d/t low TSH. Pt is doing well with new dose so far, will return in about 6-8 weeks for repeat TSH.   Plan  Continue current medications    Health Maintenance   Patient is currently controlled on the following medications: Multivitamin gummy, vitamin D3 1000 IU daily, Preservision, Prevident dental cream  We discussed:  Pt has been having swallowing issues lately (has seen SLP), she stopped taking calcium-vitamin D tablet and Preservision due to large pill size. Discussed that alternate dosage forms are available, including Calcium gummies and Preservision chewable tablets.  Plan  Continue current medications Recommend calcium gummies and Preservision chewable  Vaccines   Reviewed and discussed patient's vaccination history.    Immunization History  Administered Date(s) Administered  . Fluad Quad(high Dose 65+) 05/23/2019  . Influenza Split 06/21/2011  . Influenza Whole 09/12/1999, 07/06/2005, 06/18/2008, 06/22/2010  . Influenza, High Dose Seasonal PF 07/08/2013, 06/09/2017, 06/01/2018  . Influenza,inj,Quad PF,6+ Mos 05/23/2016  . Influenza-Unspecified  11/20/2014, 06/12/2015, 06/09/2017, 06/01/2018  . PFIZER SARS-COV-2 Vaccination 10/16/2019, 11/10/2019  . Pneumococcal Conjugate-13 10/08/2015  . Pneumococcal Polysaccharide-23 07/17/2002, 07/31/2013  . Td 01/07/2010  . Zoster Recombinat (Shingrix) 05/02/2018, 07/01/2018    Plan  Patient is up to date on immunizations  Medication Management   Pt uses [facility] for all medications Does not use pill box Pt endorses 100% compliance  We discussed:  Benefits of med synchronization, packaging and delivery with [facility]. Pt is happy with [facility] and wishes to maintain independence by managing her own meds. She endorses good compliance and is satisfied with her own method.  Plan  Continue current medication management strategy     Follow up: 3 month phone visit  Charlene Brooke, PharmD Clinical Pharmacist [facility] at [facility] [phone]

## 2019-12-02 NOTE — Patient Instructions (Addendum)
Visit Information  Thank you for meeting with me to discuss your medications! I look forward to working with you to achieve your health care goals. Below is a summary of what we talked about during the visit:  Goals Addressed            This Visit's Progress   . Phamacy Care Plan       CARE PLAN ENTRY  Current Barriers:  . Chronic Disease Management support, education, and care coordination needs related to HTN and HLD  Pharmacist Clinical Goal(s):  Marland Kitchen Maintain BP < 150/90 . Maintain LDL < 70 . Ensure safety, efficacy, and affordability of medications  Interventions: . Comprehensive medication review performed. . Recommend calcium gummy (NatureMade, VitaFusion, or store brand) . Recommend chewable version of Preservision   Patient Self Care Activities:  . Self administers medications as prescribed, Calls pharmacy for medication refills, and Calls provider office for new concerns or questions  Initial goal documentation       Jessica Sherman was given information about Chronic Care Management services today including:  1. CCM service includes personalized support from designated clinical staff supervised by her physician, including individualized plan of care and coordination with other care providers 2. 24/7 contact phone numbers for assistance for urgent and routine care needs. 3. Standard insurance, coinsurance, copays and deductibles apply for chronic care management only during months in which we provide at least 20 minutes of these services. Most insurances cover these services at 100%, however patients may be responsible for any copay, coinsurance and/or deductible if applicable. This service may help you avoid the need for more expensive face-to-face services. 4. Only one practitioner may furnish and bill the service in a calendar month. 5. The patient may stop CCM services at any time (effective at the end of the month) by phone call to the office staff.  Patient agreed  to services and verbal consent obtained.   The patient verbalized understanding of instructions provided today and agreed to receive a mailed copy of patient instruction and/or educational materials. Telephone follow up appointment with pharmacy team member scheduled for: 02/26/20 @ 1:00 pm   Charlene Brooke, PharmD Clinical Pharmacist [facility] [phone]   Managing Your Hypertension Hypertension is commonly called high blood pressure. This is when the force of your blood pressing against the walls of your arteries is too strong. Arteries are blood vessels that carry blood from your heart throughout your body. Hypertension forces the heart to work harder to pump blood, and may cause the arteries to become narrow or stiff. Having untreated or uncontrolled hypertension can cause heart attack, stroke, kidney disease, and other problems. What are blood pressure readings? A blood pressure reading consists of a higher number over a lower number. Ideally, your blood pressure should be below 120/80. The first ("top") number is called the systolic pressure. It is a measure of the pressure in your arteries as your heart beats. The second ("bottom") number is called the diastolic pressure. It is a measure of the pressure in your arteries as the heart relaxes. What does my blood pressure reading mean? Blood pressure is classified into four stages. Based on your blood pressure reading, your health care provider may use the following stages to determine what type of treatment you need, if any. Systolic pressure and diastolic pressure are measured in a unit called mm Hg. Normal  Systolic pressure: below 123456.  Diastolic pressure: below 80. Elevated  Systolic pressure: [id number].  Diastolic  pressure: below 80. Hypertension stage 1  Systolic pressure: 0000000.  Diastolic pressure: [id number]. Hypertension stage 2  Systolic pressure: [id number] or above.  Diastolic pressure: 90 or  above. What health risks are associated with hypertension? Managing your hypertension is an important responsibility. Uncontrolled hypertension can lead to:  A heart attack.  A stroke.  A weakened blood vessel (aneurysm).  Heart failure.  Kidney damage.  Eye damage.  Metabolic syndrome.  Memory and concentration problems. What changes can I make to manage my hypertension? Hypertension can be managed by making lifestyle changes and possibly by taking medicines. Your health care provider will help you make a plan to bring your blood pressure within a normal range. Eating and drinking   Eat a diet that is high in fiber and potassium, and low in salt (sodium), added sugar, and fat. An example eating plan is called the DASH (Dietary Approaches to Stop Hypertension) diet. To eat this way: ? Eat plenty of fresh fruits and vegetables. Try to fill half of your plate at each meal with fruits and vegetables. ? Eat whole grains, such as whole wheat pasta, brown rice, or whole grain bread. Fill about one quarter of your plate with whole grains. ? Eat low-fat diary products. ? Avoid fatty cuts of meat, processed or cured meats, and poultry with skin. Fill about one quarter of your plate with lean proteins such as fish, chicken without skin, beans, eggs, and tofu. ? Avoid premade and processed foods. These tend to be higher in sodium, added sugar, and fat.  Reduce your daily sodium intake. Most people with hypertension should eat less than 1,500 mg of sodium a day.  Limit alcohol intake to no more than 1 drink a day for nonpregnant women and 2 drinks a day for men. One drink equals 12 oz of beer, 5 oz of wine, or 1 oz of hard liquor. Lifestyle  Work with your health care provider to maintain a healthy body weight, or to lose weight. Ask what an ideal weight is for you.  Get at least 30 minutes of exercise that causes your heart to beat faster (aerobic exercise) most days of the week.  Activities may include walking, swimming, or biking.  Include exercise to strengthen your muscles (resistance exercise), such as weight lifting, as part of your weekly exercise routine. Try to do these types of exercises for 30 minutes at least 3 days a week.  Do not use any products that contain nicotine or tobacco, such as cigarettes and e-cigarettes. If you need help quitting, ask your health care provider.  Control any long-term (chronic) conditions you have, such as high cholesterol or diabetes. Monitoring  Monitor your blood pressure at home as told by your health care provider. Your personal target blood pressure may vary depending on your medical conditions, your age, and other factors.  Have your blood pressure checked regularly, as often as told by your health care provider. Working with your health care provider  Review all the medicines you take with your health care provider because there may be side effects or interactions.  Talk with your health care provider about your diet, exercise habits, and other lifestyle factors that may be contributing to hypertension.  Visit your health care provider regularly. Your health care provider can help you create and adjust your plan for managing hypertension. Will I need medicine to control my blood pressure? Your health care provider may prescribe medicine if lifestyle changes are not enough to get your blood  pressure under control, and if:  Your systolic blood pressure is 130 or higher.  Your diastolic blood pressure is 80 or higher. Take medicines only as told by your health care provider. Follow the directions carefully. Blood pressure medicines must be taken as prescribed. The medicine does not work as well when you skip doses. Skipping doses also puts you at risk for problems. Contact a health care provider if:  You think you are having a reaction to medicines you have taken.  You have repeated (recurrent) headaches.  You feel  dizzy.  You have swelling in your ankles.  You have trouble with your vision. Get help right away if:  You develop a severe headache or confusion.  You have unusual weakness or numbness, or you feel faint.  You have severe pain in your chest or abdomen.  You vomit repeatedly.  You have trouble breathing. Summary  Hypertension is when the force of blood pumping through your arteries is too strong. If this condition is not controlled, it may put you at risk for serious complications.  Your personal target blood pressure may vary depending on your medical conditions, your age, and other factors. For most people, a normal blood pressure is less than 120/80.  Hypertension is managed by lifestyle changes, medicines, or both. Lifestyle changes include weight loss, eating a healthy, low-sodium diet, exercising more, and limiting alcohol. This information is not intended to replace advice given to you by your health care provider. Make sure you discuss any questions you have with your health care provider. Document Revised: 12/20/2018 Document Reviewed: 07/26/2016 Elsevier Patient Education  [place].

## 2019-12-02 NOTE — Progress Notes (Signed)
  I have reviewed this encounter including the documentation in this note and/or discussed this patient with the care management provider. I am certifying that I agree with the content of this note as supervising physician.  [name], MD   12/02/2019

## 2019-12-04 NOTE — Addendum Note (Signed)
Addended by: Karle Barr on: 12/04/2019 05:27 AM   Modules accepted: Orders

## 2019-12-10 DIAGNOSIS — H6121 Impacted cerumen, right ear: Secondary | ICD-10-CM | POA: Diagnosis not present

## 2019-12-10 DIAGNOSIS — Z923 Personal history of irradiation: Secondary | ICD-10-CM | POA: Diagnosis not present

## 2019-12-10 DIAGNOSIS — Z85810 Personal history of malignant neoplasm of tongue: Secondary | ICD-10-CM | POA: Diagnosis not present

## 2019-12-10 DIAGNOSIS — J343 Hypertrophy of nasal turbinates: Secondary | ICD-10-CM | POA: Diagnosis not present

## 2019-12-10 DIAGNOSIS — H938X2 Other specified disorders of left ear: Secondary | ICD-10-CM | POA: Diagnosis not present

## 2019-12-18 ENCOUNTER — Ambulatory Visit: Payer: Self-pay | Admitting: Pharmacist

## 2019-12-18 DIAGNOSIS — Z0000 Encounter for general adult medical examination without abnormal findings: Secondary | ICD-10-CM

## 2019-12-18 NOTE — Chronic Care Management (AMB) (Signed)
  Chronic Care Management   Patient Call  12/18/2019 Name: Jessica Sherman MRN: [id number] DOB: 18-Nov-1930  Referred by: [name], MD Reason for referral : Chronic Care Management (Advice only)  Received call from patient, she is beginning to have sinus symptoms including congestion and runny nose and asked for recommendations. She has only tried Tylenol Sinus (APAP, guaifenesin, phenylephrine) so far with little relief. Of note, she has received both COVID vaccines as of 11/10/19.  She also reports she has not gotten her taste/smell back since having COVID 6 months ago, her appetite is down a little, she asked if there was anything she could take. Discussed medications that stimulate appetite, I recommended against these given side effect profiles.  Pt denies other issues at this time.  Plan:  Recommended OTC antihistamine (Zyrtec, Claritin, or Allegra) Recommended Boost or Ensure to supplement diet If no improvement over next 7 days, schedule visit with PCP  Charlene Brooke, PharmD 12/18/19 12:14 PM

## 2019-12-25 ENCOUNTER — Other Ambulatory Visit: Payer: Self-pay | Admitting: Internal Medicine

## 2020-01-26 ENCOUNTER — Other Ambulatory Visit: Payer: Self-pay | Admitting: Internal Medicine

## 2020-01-27 ENCOUNTER — Other Ambulatory Visit: Payer: Self-pay

## 2020-01-27 ENCOUNTER — Other Ambulatory Visit (INDEPENDENT_AMBULATORY_CARE_PROVIDER_SITE_OTHER): Payer: PPO

## 2020-01-27 DIAGNOSIS — E039 Hypothyroidism, unspecified: Secondary | ICD-10-CM

## 2020-01-27 LAB — TSH: TSH: 0.89 u[IU]/mL (ref 0.35–4.50)

## 2020-01-29 ENCOUNTER — Telehealth: Payer: Self-pay

## 2020-01-29 MED ORDER — LEVOTHYROXINE SODIUM 75 MCG PO TABS: ORAL_TABLET | ORAL | 1 refills | Status: DC

## 2020-01-29 NOTE — Telephone Encounter (Signed)
Pt aware of results 

## 2020-01-29 NOTE — Telephone Encounter (Signed)
New message    Calling for test results  

## 2020-02-16 ENCOUNTER — Telehealth: Payer: Self-pay | Admitting: Pharmacist

## 2020-02-16 ENCOUNTER — Other Ambulatory Visit: Payer: Self-pay

## 2020-02-16 MED ORDER — SIMVASTATIN 20 MG PO TABS: 20.0000 mg | ORAL_TABLET | Freq: Every day | ORAL | 1 refills | Status: DC

## 2020-02-16 NOTE — Telephone Encounter (Signed)
Received call from patient. She is requesting a refill for simvastatin. She has already asked [facility] and has been out for a few days now.  Refill request: simvastatin 20 mg Pharmacy: [facility] [phone]

## 2020-02-16 NOTE — Telephone Encounter (Signed)
Refill faxed today 

## 2020-02-26 ENCOUNTER — Telehealth: Payer: PPO

## 2020-02-27 ENCOUNTER — Ambulatory Visit: Payer: PPO | Admitting: Pharmacist

## 2020-02-27 ENCOUNTER — Other Ambulatory Visit: Payer: Self-pay

## 2020-02-27 NOTE — Patient Instructions (Addendum)
Visit Information  Phone number for Pharmacist: [phone]  Goals Addressed            This Visit's Progress   . Phamacy Care Plan       CARE PLAN ENTRY (see longitudinal plan of care for additional care plan information)  Current Barriers:  . Chronic Disease Management support, education, and care coordination needs related to Hypertension, Hyperlipidemia, and Supplements   Hypertension BP Readings from Last 3 Encounters:  11/26/19 (!) 158/62  11/03/19 (!) 161/53  07/08/19 (!) 152/58 .  Pharmacist Clinical Goal(s): o Over the next 90 days, patient will work with PharmD and providers to achieve BP goal <150/90 . Current regimen:  o Amlodipine 5 mg daily . Interventions: o Discussed BP goal and benefits of checking BP at home . Patient self care activities - Over the next 30 days, patient will: o Check BP 3 times a week, document, and provide at future appointments o Ensure daily salt intake < 2300 mg/day  Hyperlipidemia Lab Results  Component Value Date/Time   LDLCALC 66 11/26/2019 10:48 AM   LDLCALC 184 (H) 09/15/2014 10:18 AM   LDLDIRECT 57.0 01/27/2019 02:24 PM .  Pharmacist Clinical Goal(s): o Over the next 90 days, patient will work with PharmD and providers to maintain LDL goal < 100 . Current regimen:  o Simvastatin 20 mg at bedtime . Interventions: o Discussed benefits of simvastatin for prevention of heart attack / stroke . Patient self care activities - Over the next 90 days, patient will: o Continue medications as prescribed o Continue low cholesterol diet  Supplements . Pharmacist Clinical Goal(s) o Over the next 90 days, patient will work with PharmD and providers to optimize therapy . Current regimen:   Multivitamin gummy,   Calcium-Vitamin D  Preservision . Interventions: o Recommended calcium gummies and Preservision chewable tablets for easier swallowing . Patient self care activities - Over the next 30 days, patient will: o Try gummies  and chewable forms of vitamins/supplements for easier swallowing  Medication management . Pharmacist Clinical Goal(s): o Over the next 90 days, patient will work with PharmD and providers to maintain optimal medication adherence . Current pharmacy: [facility] . Interventions o Comprehensive medication review performed. o Utilize [facility] for medication synchronization, packaging and delivery . Patient self care activities - Over the next 90 days, patient will: o Focus on medication adherence by fill date o Take medications as prescribed o Report any questions or concerns to PharmD and/or provider(s)  Please see past updates related to this goal by clicking on the "Past Updates" button in the selected goal       The patient verbalized understanding of instructions provided today and agreed to receive a mailed copy of patient instruction and/or educational materials.  Telephone follow up appointment with pharmacy team member scheduled for: 4 months  Charlene Brooke, PharmD Clinical Pharmacist [facility] [phone]  How to Take Your Blood Pressure Blood pressure is a measurement of how strongly your blood is pressing against the walls of your arteries. Arteries are blood vessels that carry blood from your heart throughout your body. Your health care provider takes your blood pressure at each office visit. You can also take your own blood pressure at home with a blood pressure machine. You may need to take your own blood pressure:  To confirm a diagnosis of high blood pressure (hypertension).  To monitor your blood pressure over time.  To make sure your blood pressure medicine is  working. Supplies needed: To take your blood pressure, you will need a blood pressure machine. You can buy a blood pressure machine, or blood pressure monitor, at most drugstores or online. There are several types of home blood pressure monitors. When choosing one,  consider the following:  Choose a monitor that has an arm cuff.  Choose a cuff that wraps snugly around your upper arm. You should be able to fit only one finger between your arm and the cuff.  Do not choose a monitor that measures your blood pressure from your wrist or finger. Your health care provider can suggest a reliable monitor that will meet your needs. How to prepare To get the most accurate reading, avoid the following for 30 minutes before you check your blood pressure:  Drinking caffeine.  Drinking alcohol.  Eating.  Smoking.  Exercising. Five minutes before you check your blood pressure:  Empty your bladder.  Sit quietly without talking in a dining chair, rather than in a soft couch or armchair. How to take your blood pressure To check your blood pressure, follow the instructions in the manual that came with your blood pressure monitor. If you have a digital blood pressure monitor, the instructions may be as follows: 1. Sit up straight. 2. Place your feet on the floor. Do not cross your ankles or legs. 3. Rest your left arm at the level of your heart on a table or desk or on the arm of a chair. 4. Pull up your shirt sleeve. 5. Wrap the blood pressure cuff around the upper part of your left arm, 1 inch (2.5 cm) above your elbow. It is best to wrap the cuff around bare skin. 6. Fit the cuff snugly around your arm. You should be able to place only one finger between the cuff and your arm. 7. Position the cord inside the groove of your elbow. 8. Press the power button. 9. Sit quietly while the cuff inflates and deflates. 10. Read the digital reading on the monitor screen and write it down (record it). 11. Wait 2-3 minutes, then repeat the steps, starting at step 1. What does my blood pressure reading mean? A blood pressure reading consists of a higher number over a lower number. Ideally, your blood pressure should be below 120/80. The first ("top") number is called the  systolic pressure. It is a measure of the pressure in your arteries as your heart beats. The second ("bottom") number is called the diastolic pressure. It is a measure of the pressure in your arteries as the heart relaxes. Blood pressure is classified into four stages. The following are the stages for adults who do not have a short-term serious illness or a chronic condition. Systolic pressure and diastolic pressure are measured in a unit called mm Hg. Normal  Systolic pressure: below 093.  Diastolic pressure: below 80. Elevated  Systolic pressure: 818-299.  Diastolic pressure: below 80. Hypertension stage 1  Systolic pressure: 371-696.  Diastolic pressure: 78-93. Hypertension stage 2  Systolic pressure: 810 or above.  Diastolic pressure: 90 or above. You can have prehypertension or hypertension even if only the systolic or only the diastolic number in your reading is higher than normal. Follow these instructions at home:  Check your blood pressure as often as recommended by your health care provider.  Take your monitor to the next appointment with your health care provider to make sure: ? That you are using it correctly. ? That it provides accurate readings.  Be sure you  understand what your goal blood pressure numbers are.  Tell your health care provider if you are having any side effects from blood pressure medicine. Contact a health care provider if:  Your blood pressure is consistently high. Get help right away if:  Your systolic blood pressure is higher than 180.  Your diastolic blood pressure is higher than 110. This information is not intended to replace advice given to you by your health care provider. Make sure you discuss any questions you have with your health care provider. Document Revised: 08/10/2017 Document Reviewed: 02/04/2016 Elsevier Patient Education  2020 [employer].

## 2020-02-27 NOTE — Chronic Care Management (AMB) (Signed)
Chronic Care Management Pharmacy  Name: Jessica Sherman  MRN: [id number] DOB: [birth year]  Chief Complaint/ HPI  Jessica Sherman,  86 y.o. , female presents for their Follow-Up CCM visit with the clinical pharmacist via telephone due to COVID-19 Pandemic.  PCP : Jessica Rail, MD  Their chronic conditions include: HTN, HLD, hypothyroidism, prediabetes, hx breast cancer and tongue cancer, osteopenia  Pt has large family, children grandchildren and great-grandchildren all in [place] area. Pt is quite active, still driving herself, shopping for herself. Biggest issue is swallowing.   Office Visits: 11/26/19 Dr Jessica Sherman: BP slightly elevated in office, did not add med d/t her age d/t risk for hypotension. No med changes.  Consult Visit: 11/13/19 SLP: HEP procedure WNL, rec'd small bites/sips for dysphagia.  11/03/19 Dr Jessica Sherman (heme/onc): breast ca treated 2017-2018, no evidence of recurrence. Tongue cancer treated summer 2020.   Medications: Outpatient Encounter Medications as of 02/27/2020  Medication Sig Note  . amLODipine (NORVASC) 5 MG tablet TAKE 1 TABLET(5 MG) BY MOUTH DAILY   . Calcium Carb-Cholecalciferol (CALCIUM 600+D3 PO) Take 1 tablet by mouth 2 (two) times daily. 12/02/2019: Patient cannot swallow large pill right now  . Cholecalciferol (VITAMIN D3) 1000 UNITS CAPS Take 1,000 Units by mouth daily.    Marland Kitchen levothyroxine (SYNTHROID) 75 MCG tablet TAKE 1 TABLET(75 MCG) BY MOUTH DAILY   . Multiple Vitamins-Minerals (PRESERVISION AREDS PO) Take by mouth.   . simvastatin (ZOCOR) 20 MG tablet Take 1 tablet (20 mg total) by mouth at bedtime.   . sodium fluoride (PREVIDENT 5000 PLUS) 1.1 % CREA dental cream Place gel into fluoride tray at bedtime.  Place over teeth for 5 minutes.  Remove.  Spit out excess.  Repeat nightly.   . [DISCONTINUED] prochlorperazine (COMPAZINE) 10 MG tablet Take 1 tablet (10 mg total) by mouth every 6 (six) hours as needed (Nausea or vomiting). (Patient not  taking: Reported on 07/27/2016)    No facility-administered encounter medications on file as of 02/27/2020.     Current Diagnosis/Assessment:  SDOH Interventions     Most Recent Value  SDOH Interventions  Financial Strain Interventions Intervention Not Indicated     Goals Addressed            This Visit's Progress   . Phamacy Care Plan       CARE PLAN ENTRY (see longitudinal plan of care for additional care plan information)  Current Barriers:  . Chronic Disease Management support, education, and care coordination needs related to Hypertension, Hyperlipidemia, and Supplements   Hypertension BP Readings from Last 3 Encounters:  11/26/19 (!) 158/62  11/03/19 (!) 161/53  07/08/19 (!) 152/58 .  Pharmacist Clinical Goal(s): o Over the next 90 days, patient will work with PharmD and providers to achieve BP goal <150/90 . Current regimen:  o Amlodipine 5 mg daily . Interventions: o Discussed BP goal and benefits of checking BP at home . Patient self care activities - Over the next 30 days, patient will: o Check BP 3 times a week, document, and provide at future appointments o Ensure daily salt intake < 2300 mg/day  Hyperlipidemia Lab Results  Component Value Date/Time   LDLCALC 66 11/26/2019 10:48 AM   LDLCALC 184 (H) 09/15/2014 10:18 AM   LDLDIRECT 57.0 01/27/2019 02:24 PM .  Pharmacist Clinical Goal(s): o Over the next 90 days, patient will work with PharmD and providers to maintain LDL goal < 100 . Current regimen:  o Simvastatin 20 mg at bedtime . Interventions:  o Discussed benefits of simvastatin for prevention of heart attack / stroke . Patient self care activities - Over the next 90 days, patient will: o Continue medications as prescribed o Continue low cholesterol diet  Supplements . Pharmacist Clinical Goal(s) o Over the next 90 days, patient will work with PharmD and providers to optimize therapy . Current regimen:   Multivitamin gummy,    Calcium-Vitamin D  Preservision . Interventions: o Recommended calcium gummies and Preservision chewable tablets for easier swallowing . Patient self care activities - Over the next 30 days, patient will: o Try gummies and chewable forms of vitamins/supplements for easier swallowing  Medication management . Pharmacist Clinical Goal(s): o Over the next 90 days, patient will work with PharmD and providers to maintain optimal medication adherence . Current pharmacy: [facility] . Interventions o Comprehensive medication review performed. o Utilize [facility] pharmacy for medication synchronization, packaging and delivery . Patient self care activities - Over the next 90 days, patient will: o Focus on medication adherence by fill date o Take medications as prescribed o Report any questions or concerns to PharmD and/or provider(s)  Please see past updates related to this goal by clicking on the "Past Updates" button in the selected goal        Hypertension   Office blood pressures are  BP Readings from Last 3 Encounters:  11/26/19 (!) 158/62  11/03/19 (!) 161/53  07/08/19 (!) 152/58   Patient checks BP at home infrequently  Patient home BP readings are ranging: 155/80  Patient has failed these meds in the past: metoprolol, Patient is currently uncontrolled on the following medications:   amlodipine 5 mg daily  We discussed benefits of checking BP at home; pt's cuff is out of batteries, she will get batteries and start checking BP at home; discussed white coat HTN and effect of stress/anxiety on BP.  Plan  Continue current medications and control with diet and exercise  Recommend check BP at home several times per month  Hyperlipidemia   Lipid Panel     Component Value Date/Time   CHOL 145 11/26/2019 1048   TRIG 71.0 11/26/2019 1048   HDL 65.40 11/26/2019 1048   CHOLHDL 2 11/26/2019 1048   VLDL 14.2 11/26/2019 1048   LDLCALC 66 11/26/2019 1048   LDLDIRECT 57.0  01/27/2019 1424     The ASCVD Risk score (Goff DC Jr., et al., 2013) failed to calculate for the following reasons:   The 2013 ASCVD risk score is only valid for ages 36 to 19   Patient has failed these meds in past: n/a Patient is currently controlled on the following medications:   simvastatin 20 mg HS  We discussed:  diet and exercise extensively, cholesterol has been controlled on this dose, pt does not endorse issues with statin.  Plan  Continue current medications and control with diet and exercise   Hypothyroidism   Lab Results  Component Value Date/Time   TSH 0.89 01/27/2020 10:44 AM   TSH 0.15 (L) 11/26/2019 10:48 AM   Patient has failed these meds in past: n/a Patient is currently uncontrolled on the following medications:   levothyroxine 75 mcg daily, 1/2 tablet on Wednesdays  We discussed: TSH normalized after dose decrease.  Plan  Continue current medications    Health Maintenance   Patient is currently controlled on the following medications:   Multivitamin gummy,   vitamin D3 1000 IU daily,   Preservision,   Prevident dental cream  We discussed:  Pt has been having  swallowing issues lately (has seen SLP), she stopped taking calcium-vitamin D tablet and Preservision due to large pill size. Discussed that alternate dosage forms are available, including Calcium gummies and Preservision chewable tablets.  Plan  Continue current medications Recommend calcium gummies and Preservision chewable   Medication Management   Pt uses [facility] for all medications Does not use pill box Pt endorses 100% compliance  We discussed:  Verbal consent obtained for [facility] enhanced pharmacy services (medication synchronization, adherence packaging, delivery coordination). A medication sync plan was created to allow patient to get all medications delivered once every 30 to 90 days per patient preference. Patient understands they have freedom to  choose pharmacy and clinical pharmacist will coordinate care between all prescribers and [facility].   Plan  Utilize [facility] pharmacy for medication synchronization, packaging and delivery   Follow up: 3 month phone visit  Charlene Brooke, PharmD Clinical Pharmacist [facility] [phone]

## 2020-03-25 NOTE — Progress Notes (Signed)
[name] presents today for follow up of radiation to her base of tongue completed on 06/02/2019  Pain issues, if any: Patient denies Using a feeding tube?: N/A Weight changes, if any:  Wt Readings from Last 3 Encounters:  03/26/20 98 lb 9.6 oz (44.7 kg)  11/26/19 102 lb 12.8 oz (46.6 kg)  11/03/19 102 lb 11.2 oz (46.6 kg)   Swallowing issues, if any: Patient reports occasional difficulty with eating/drinking (feelings of food getting stuck). On the whole though she reports a very healthy appetite and eating as much as she can.  Smoking or chewing tobacco? None Using fluoride trays daily? Occasionally Last ENT visit was on: 12/10/2019 Saw Dr. Melida Quitter: "She had a feeding tube that has since been removed. Surveillance has gone well so far. She is struggling to gain weight. She has worked with speech pathology to help with swallow. She is doing well swallowing fluids and most solids. She struggles with bulkier meats and large pills. There is no pain. - She has no evidence of recurrence of her tongue base cancer now about 6 months from completing treatment. She will follow-up with Dr. Isidore Moos in two months and with me in four months. - The right ear was cleared of obstructing cerumen and hearing improved." (Patient reports she has another appointment scheduled with Dr. Redmond Baseman in August)  Other notable issues, if any: Patient still has an altered sense of taste, but says it's improved from when she first finished radiation. She has occasional vocal hoarseness. She denies any ear/jaw pain, or difficulty opening/closing her mouth.  Vitals:   03/26/20 1415  BP: (!) 176/76  Pulse: 67  Resp: 18  Temp: 98.7 F (37.1 C)  SpO2: 100%

## 2020-03-26 ENCOUNTER — Ambulatory Visit
Admission: RE | Admit: 2020-03-26 | Discharge: 2020-03-26 | Disposition: A | Discharge status: Home / Self Care | Payer: PPO | Source: Ambulatory Visit | Attending: Radiation Oncology | Admitting: Radiation Oncology

## 2020-03-26 ENCOUNTER — Other Ambulatory Visit: Payer: Self-pay

## 2020-03-26 VITALS — BP 176/76 | HR 67 | Temp 98.7°F | Resp 18 | Ht 64.0 in | Wt 98.6 lb

## 2020-03-26 DIAGNOSIS — C01 Malignant neoplasm of base of tongue: Secondary | ICD-10-CM | POA: Insufficient documentation

## 2020-03-26 DIAGNOSIS — Z08 Encounter for follow-up examination after completed treatment for malignant neoplasm: Secondary | ICD-10-CM | POA: Diagnosis not present

## 2020-03-26 DIAGNOSIS — Z923 Personal history of irradiation: Secondary | ICD-10-CM | POA: Diagnosis not present

## 2020-03-26 MED ORDER — LARYNGOSCOPY SOLUTION RAD-ONC
15.0000 mL | Freq: Once | TOPICAL | Status: AC
  Administered 2020-03-26: 15 mL via TOPICAL
  Filled 2020-03-26: qty 15

## 2020-03-26 NOTE — Progress Notes (Signed)
Radiation Oncology         [phone] ________________________________  Name: Jessica Sherman MRN: [id number]  Date: 03/26/2020  DOB: 1931/04/25  Follow-Up Visit Note  Outpatient, in person.  CC: [name], MD  [name], MD  Diagnosis and Prior Radiotherapy:       ICD-10-CM   1. Cancer of base of tongue ([place])  C01 laryngocopy solution for Rad-Onc    Fiberoptic laryngoscopy    04/14/2019 through 06/02/2019  Site Technique Total Dose Dose per Fx Completed Fx Beam Energies  Head & neck: HN_BOT IMRT 70/70 2 35/35 6X   CHIEF COMPLAINT:  Here for follow-up and surveillance of base of tongue cancer  Narrative:    Jessica Sherman presents today for follow up of radiation to her base of tongue completed on 06/02/2019  Pain issues, if any: Patient denies Using a feeding tube?: N/A Weight changes, if any:  Wt Readings from Last 3 Encounters:  03/26/20 98 lb 9.6 oz (44.7 kg)  11/26/19 102 lb 12.8 oz (46.6 kg)  11/03/19 102 lb 11.2 oz (46.6 kg)   Swallowing issues, if any: Patient reports occasional difficulty with eating/drinking (feelings of food getting stuck). On the whole though she reports a very healthy appetite and eating as much as she can.  Smoking or chewing tobacco? None Using fluoride trays daily? Occasionally Last ENT visit was on: 12/10/2019 Saw Dr. Melida Quitter: "She had a feeding tube that has since been removed. Surveillance has gone well so far. She is struggling to gain weight. She has worked with speech pathology to help with swallow. She is doing well swallowing fluids and most solids. She struggles with bulkier meats and large pills. There is no pain. - She has no evidence of recurrence of her tongue base cancer now about 6 months from completing treatment. She will follow-up with Dr. Isidore Moos in two months and with me in four months. - The right ear was cleared of obstructing cerumen and hearing improved." (Patient reports she has another appointment  scheduled with Dr. Redmond Baseman in August)  Other notable issues, if any: Patient still has an altered sense of taste, but says it's improved from when she first finished radiation. She has occasional vocal hoarseness. She denies any ear/jaw pain, or difficulty opening/closing her mouth.   ALLERGIES:  is allergic to other.  Meds: Current Outpatient Medications  Medication Sig Dispense Refill  . amLODipine (NORVASC) 5 MG tablet TAKE 1 TABLET(5 MG) BY MOUTH DAILY 90 tablet 3  . Calcium Carb-Cholecalciferol (CALCIUM 600+D3 PO) Take 1 tablet by mouth 2 (two) times daily.    . Cholecalciferol (VITAMIN D3) 1000 UNITS CAPS Take 1,000 Units by mouth daily.     Marland Kitchen levothyroxine (SYNTHROID) 75 MCG tablet TAKE 1 TABLET(75 MCG) BY MOUTH DAILY 90 tablet 1  . Multiple Vitamins-Minerals (PRESERVISION AREDS PO) Take by mouth.    . simvastatin (ZOCOR) 20 MG tablet Take 1 tablet (20 mg total) by mouth at bedtime. 90 tablet 1  . sodium fluoride (PREVIDENT 5000 PLUS) 1.1 % CREA dental cream Place gel into fluoride tray at bedtime.  Place over teeth for 5 minutes.  Remove.  Spit out excess.  Repeat nightly. 1 Tube prn   No current facility-administered medications for this encounter.    Physical Findings: The patient is in no acute distress. Patient is alert and oriented. Wt Readings from Last 3 Encounters:  03/26/20 98 lb 9.6 oz (44.7 kg)  11/26/19 102 lb 12.8 oz (46.6 kg)  11/03/19 102 lb 11.2 oz (46.6 kg)    height is 5\' 4"  (1.626 m) and weight is 98 lb 9.6 oz (44.7 kg). Her temperature is 98.7 F (37.1 C). Her blood pressure is 176/76 (abnormal) and her pulse is 67. Her respiration is 18 and oxygen saturation is 100%. .  General: Alert and oriented, in no acute distress  HEENT: Modest amount of cerumen in the right ear canal, left ear canal is clear.  Oropharynx and oral cavity are clear Neck is without palpable cervical or supraclavicular masses Heart is regular in rate and rhythm with no murmurs  Chest is  clear to auscultation bilaterally  PROCEDURE NOTE: After obtaining consent and anesthetizing the nasal cavity with topical lidocaine and phenylephrine, the flexible endoscope was introduced and passed through the nasal cavity.  No lesions appreciated in the pharynx, hypopharynx, or larynx.  The cords are symmetrically mobile without lesions.    Lab Findings: Lab Results  Component Value Date   WBC 5.0 11/26/2019   HGB 12.1 11/26/2019   HCT 34.4 (L) 11/26/2019   MCV 99.6 11/26/2019   PLT 217.0 11/26/2019    Lab Results  Component Value Date   TSH 0.89 01/27/2020    Radiographic Findings: No results found.  Impression/Plan:    1) Head and Neck Cancer Status: NED  2) Nutritional Status: eating well, slight weight loss, encouraged to push nutritional intake PEG tube: has been removed  3) Swallowing: good function, continue exercises via SLP  4) Dental: Encouraged to continue regular followup with dentistry and regular dental hygiene  5)  Thyroid function:  Take levothyroxine per outside MD (preceding condition). Energy is great. Lab Results  Component Value Date   TSH 0.89 01/27/2020    6) We will facilitate f/u appt scheduling with Dr. Redmond Baseman  in 64mo. F/u with me in February 2022, sooner if needed.  7) We discussed measures to reduce the risk of infection during the COVID-19 pandemic.  She has been vaccinated.  On date of service, in total, I spent 30 minutes on this encounter. Patient was seen in person.  _____________________________________   [name], MD

## 2020-04-14 ENCOUNTER — Other Ambulatory Visit: Payer: Self-pay

## 2020-04-14 DIAGNOSIS — I10 Essential (primary) hypertension: Secondary | ICD-10-CM

## 2020-04-14 MED ORDER — LEVOTHYROXINE SODIUM 75 MCG PO TABS: ORAL_TABLET | ORAL | 1 refills | Status: DC

## 2020-04-14 MED ORDER — AMLODIPINE BESYLATE 5 MG PO TABS: ORAL_TABLET | ORAL | 3 refills | Status: DC

## 2020-04-14 MED ORDER — SIMVASTATIN 20 MG PO TABS: 20.0000 mg | ORAL_TABLET | Freq: Every day | ORAL | 1 refills | Status: DC

## 2020-05-11 ENCOUNTER — Telehealth: Payer: Self-pay | Admitting: Pharmacist

## 2020-05-11 NOTE — Progress Notes (Signed)
    Chronic Care Management Pharmacy Assistant   Name: Jessica Sherman  MRN: [id number] DOB: [birth year]  Reason for Encounter: Medication Review  Patient Questions:  1.  Have you seen any other providers since your last visit? No  2.  Any changes in your medicines or health? No  PCP : [name], MD  Allergies:   Allergies  Allergen Reactions  . Other Nausea And Vomiting    Anesthesia "makes me very sick", Please pre-medicate to control nausea and vomiting    Medications: Outpatient Encounter Medications as of 05/11/2020  Medication Sig Note  . amLODipine (NORVASC) 5 MG tablet TAKE 1 TABLET(5 MG) BY MOUTH DAILY   . Calcium Carb-Cholecalciferol (CALCIUM 600+D3 PO) Take 1 tablet by mouth 2 (two) times daily. 12/02/2019: Patient cannot swallow large pill right now  . Cholecalciferol (VITAMIN D3) 1000 UNITS CAPS Take 1,000 Units by mouth daily.    Marland Kitchen levothyroxine (SYNTHROID) 75 MCG tablet TAKE 1 TABLET(75 MCG) BY MOUTH DAILY   . Multiple Vitamins-Minerals (PRESERVISION AREDS PO) Take by mouth.   . simvastatin (ZOCOR) 20 MG tablet Take 1 tablet (20 mg total) by mouth at bedtime.   . sodium fluoride (PREVIDENT 5000 PLUS) 1.1 % CREA dental cream Place gel into fluoride tray at bedtime.  Place over teeth for 5 minutes.  Remove.  Spit out excess.  Repeat nightly.   . [DISCONTINUED] prochlorperazine (COMPAZINE) 10 MG tablet Take 1 tablet (10 mg total) by mouth every 6 (six) hours as needed (Nausea or vomiting). (Patient not taking: Reported on 07/27/2016)    No facility-administered encounter medications on file as of 05/11/2020.    Current Diagnosis: Patient Active Problem List   Diagnosis Date Noted  . Hyponatremia 03/28/2019  . Cancer of base of tongue ([place]) 03/24/2019  . Oropharyngeal dysphagia 03/14/2019  . Dysphasia 01/27/2019  . Hypertension 12/13/2016  . S/P mastectomy, left 10/18/2016  . History of breast cancer 10/18/2016  . Port catheter in place 05/23/2016  .  Breast cancer of upper-outer quadrant of left female breast ([place]) 04/30/2016  . Conductive hearing loss in right ear 12/09/2015  . Enteritis 05/30/2013  . Prediabetes   . Macular degeneration, dry   . Vertigo   . Vitamin D deficiency 11/17/2008  . COLONIC POLYPS, HX OF 11/17/2008  . Hypothyroidism 11/19/2007  . HYPERLIPIDEMIA 11/19/2007  . Osteopenia 02/18/2007    Goals Addressed   None     Follow-Up:  Coordination of Enhanced Pharmacy Services     Reviewed chart for medication changes ahead of medication coordination call.  No OVs, Consults, or hospital visits since last care coordination call/Pharmacist visit.  No medication changes indicated.  BP Readings from Last 3 Encounters:  03/26/20 (!) 176/76  11/26/19 (!) 158/62  11/03/19 (!) 161/53    Lab Results  Component Value Date   HGBA1C 5.9 11/26/2019     Patient obtains medications through Vials  90 Days   Last adherence delivery included:  Amlodipine 5mg   Levothyroxine 166mcg  Patient is due for next adherence delivery on:  05/13/2020. Called patient and reviewed medications and coordinated delivery.  This delivery to include:  Amlodipine 5mg   Levothyroxine 142mcg Simvastatin 20mg    Confirmed delivery date of 05/13/2020, advised patient that pharmacy will contact them the morning of delivery.   Rosendo Gros, [facility] Pharmacist Assistant  [phone]

## 2020-05-26 NOTE — Progress Notes (Signed)
Subjective:    Patient ID: Jessica Sherman, female    DOB: 01-21-1931, 86 y.o.   MRN: [id number]  HPI The patient is here for follow up of their chronic medical problems, including htn, hyperlipidemia, hypothyroidism, prediabetes, h/o breast ca, h/o cancer at the base of the tongue   She is taking all of her medications as prescribed.    Her taste is coming back.  She is hungry all the time and eats all day.  She eats slow and chews her food and has no issues with swallowing.    She is keeping active.   She has nocturia x 3.  She is able to get back to sleep.  She drinks a lot during the day.    Medications and allergies reviewed with patient and updated if appropriate.  Patient Active Problem List   Diagnosis Date Noted  . Hyponatremia 03/28/2019  . Cancer of base of tongue ([place]) 03/24/2019  . Oropharyngeal dysphagia 03/14/2019  . Dysphasia 01/27/2019  . Hypertension 12/13/2016  . S/P mastectomy, left 10/18/2016  . History of breast cancer 10/18/2016  . Port catheter in place 05/23/2016  . Breast cancer of upper-outer quadrant of left female breast ([place]) 04/30/2016  . Conductive hearing loss in right ear 12/09/2015  . Enteritis 05/30/2013  . Prediabetes   . Macular degeneration, dry   . Vertigo   . Vitamin D deficiency 11/17/2008  . COLONIC POLYPS, HX OF 11/17/2008  . Hypothyroidism 11/19/2007  . HYPERLIPIDEMIA 11/19/2007  . Osteopenia 02/18/2007    Current Outpatient Medications on File Prior to Visit  Medication Sig Dispense Refill  . amLODipine (NORVASC) 5 MG tablet TAKE 1 TABLET(5 MG) BY MOUTH DAILY 90 tablet 3  . Calcium Carb-Cholecalciferol (CALCIUM 600+D3 PO) Take 1 tablet by mouth 2 (two) times daily.    . Cholecalciferol (VITAMIN D3) 1000 UNITS CAPS Take 1,000 Units by mouth daily.     Marland Kitchen levothyroxine (SYNTHROID) 75 MCG tablet TAKE 1 TABLET(75 MCG) BY MOUTH DAILY 90 tablet 1  . Multiple Vitamins-Minerals (PRESERVISION AREDS PO) Take by mouth.    .  simvastatin (ZOCOR) 20 MG tablet Take 1 tablet (20 mg total) by mouth at bedtime. 90 tablet 1  . sodium fluoride (PREVIDENT 5000 PLUS) 1.1 % CREA dental cream Place gel into fluoride tray at bedtime.  Place over teeth for 5 minutes.  Remove.  Spit out excess.  Repeat nightly. 1 Tube prn  . [DISCONTINUED] prochlorperazine (COMPAZINE) 10 MG tablet Take 1 tablet (10 mg total) by mouth every 6 (six) hours as needed (Nausea or vomiting). (Patient not taking: Reported on 07/27/2016) 30 tablet 1   No current facility-administered medications on file prior to visit.    Past Medical History:  Diagnosis Date  . Hx of colonic polyps   . Hyperlipidemia   . Hypothyroidism   . Macular degeneration, dry   . Osteopenia    spine BMD T score - 1.69  . PONV (postoperative nausea and vomiting)    pt has vertigo and can not lie flat  . Prediabetes    FBS 136  on 08/29/2010; A1c 6.2%  . Vertigo     Past Surgical History:  Procedure Laterality Date  . CATARACT EXTRACTION  2009   OS  . COLONOSCOPY W/ POLYPECTOMY     X 2; last 2012. Dr Watt Climes  . IR GASTROSTOMY TUBE MOD SED  04/17/2019  . IR GASTROSTOMY TUBE REMOVAL  08/29/2019  . IR GENERIC HISTORICAL  05/09/2016  IR US GUIDE VASC ACCESS RIGHT 05/09/2016 Jacqulynn Cadet, MD [facility]INTER[facility]  . IR GENERIC HISTORICAL  05/09/2016   IR FLUORO GUIDE CV LINE RIGHT 05/09/2016 Jacqulynn Cadet, MD [facility]  . MODIFIED MASTECTOMY Left 10/18/2016   Procedure: LEFT MODIFIED RADICAL MASTECTOMY;  Surgeon: [name], MD;  Location: [facility];  Service: General;  Laterality: Left;  . PILONIDAL CYST EXCISION     X 3  . TONSILLECTOMY AND ADENOIDECTOMY    . TOTAL ABDOMINAL HYSTERECTOMY     w/ bladder repair    Social History   Socioeconomic History  . Marital status: Widowed    Spouse name: Not on file  . Number of children: 4  . Years of education: Not on file  . Highest education level: Not on file  Occupational History  . Occupation:  retired  Tobacco Use  . Smoking status: Never Smoker  . Smokeless tobacco: Never Used  Vaping Use  . Vaping Use: Never used  Substance and Sexual Activity  . Alcohol use: No    Comment:    . Drug use: No  . Sexual activity: Not on file  Other Topics Concern  . Not on file  Social History Narrative   Regular exercise- yes 18mpd w/o symptoms   Social Determinants of Health   Financial Resource Strain: Low Risk   . Difficulty of Paying Living Expenses: Not hard at all  Food Insecurity:   . Worried About Charity fundraiser in the Last Year: Not on file  . Ran Out of Food in the Last Year: Not on file  Transportation Needs:   . Lack of Transportation (Medical): Not on file  . Lack of Transportation (Non-Medical): Not on file  Physical Activity:   . Days of Exercise per Week: Not on file  . Minutes of Exercise per Session: Not on file  Stress:   . Feeling of Stress : Not on file  Social Connections:   . Frequency of Communication with Friends and Family: Not on file  . Frequency of Social Gatherings with Friends and Family: Not on file  . Attends Religious Services: Not on file  . Active Member of Clubs or Organizations: Not on file  . Attends Archivist Meetings: Not on file  . Marital Status: Not on file    Family History  Problem Relation Age of Onset  . Osteoporosis Mother   . Leukemia Mother   . Cancer Maternal Uncle        ? stomach  . Aneurysm Father        AAA  . Heart attack Neg Hx     Review of Systems  Constitutional: Negative for chills and fever.  Respiratory: Negative for cough, shortness of breath and wheezing.   Cardiovascular: Negative for chest pain, palpitations and leg swelling.  Gastrointestinal: Negative for abdominal pain and nausea.  Neurological: Negative for light-headedness and headaches.       Objective:   Vitals:   05/27/20 1035  BP: 138/72  Pulse: 67  Temp: 98.1 F (36.7 C)  SpO2: 99%   BP Readings from Last 3  Encounters:  05/27/20 138/72  03/26/20 (!) 176/76  11/26/19 (!) 158/62   Wt Readings from Last 3 Encounters:  05/27/20 100 lb 3.2 oz (45.5 kg)  03/26/20 98 lb 9.6 oz (44.7 kg)  11/26/19 102 lb 12.8 oz (46.6 kg)   Body mass index is 17.2 kg/m.   Physical Exam    Constitutional: Appears well-developed  and well-nourished. No distress.  HENT:  Head: Normocephalic and atraumatic.  Neck: Neck supple. No tracheal deviation present. No thyromegaly present.  No cervical lymphadenopathy Cardiovascular: Normal rate, regular rhythm and normal heart sounds.   No murmur heard. No carotid bruit .  No edema Pulmonary/Chest: Effort normal and breath sounds normal. No respiratory distress. No has no wheezes. No rales.  Skin: Skin is warm and dry. Not diaphoretic.  Psychiatric: Normal mood and affect. Behavior is normal.      Assessment & Plan:    See Problem List for Assessment and Plan of chronic medical problems.    This visit occurred during the SARS-CoV-2 public health emergency.  Safety protocols were in place, including screening questions prior to the visit, additional usage of staff PPE, and extensive cleaning of exam room while observing appropriate contact time as indicated for disinfecting solutions.

## 2020-05-26 NOTE — Patient Instructions (Addendum)
  Blood work was ordered.   ° ° °Flu immunization administered today.   ° ° °Medications reviewed and updated.  Changes include :   none ° °Your prescription(s) have been submitted to your pharmacy. Please take as directed and contact our office if you believe you are having problem(s) with the medication(s). ° ° °Please followup in 6 months ° ° °

## 2020-05-27 ENCOUNTER — Encounter: Payer: Self-pay | Admitting: Internal Medicine

## 2020-05-27 ENCOUNTER — Other Ambulatory Visit: Payer: Self-pay

## 2020-05-27 ENCOUNTER — Ambulatory Visit (INDEPENDENT_AMBULATORY_CARE_PROVIDER_SITE_OTHER): Payer: PPO | Admitting: Internal Medicine

## 2020-05-27 VITALS — BP 138/72 | HR 67 | Temp 98.1°F | Wt 100.2 lb

## 2020-05-27 DIAGNOSIS — I10 Essential (primary) hypertension: Secondary | ICD-10-CM | POA: Diagnosis not present

## 2020-05-27 DIAGNOSIS — I700 Atherosclerosis of aorta: Secondary | ICD-10-CM | POA: Diagnosis not present

## 2020-05-27 DIAGNOSIS — E559 Vitamin D deficiency, unspecified: Secondary | ICD-10-CM

## 2020-05-27 DIAGNOSIS — E039 Hypothyroidism, unspecified: Secondary | ICD-10-CM

## 2020-05-27 DIAGNOSIS — E782 Mixed hyperlipidemia: Secondary | ICD-10-CM

## 2020-05-27 DIAGNOSIS — Z23 Encounter for immunization: Secondary | ICD-10-CM | POA: Diagnosis not present

## 2020-05-27 DIAGNOSIS — R7303 Prediabetes: Secondary | ICD-10-CM | POA: Diagnosis not present

## 2020-05-27 NOTE — Assessment & Plan Note (Signed)
Chronic Check lipid panel  Continue daily statin Regular exercise and healthy diet encouraged  

## 2020-05-27 NOTE — Assessment & Plan Note (Signed)
Chronic Check a1c Low sugar / carb diet Stressed regular exercise  

## 2020-05-27 NOTE — Assessment & Plan Note (Signed)
Chronic  Clinically euthyroid Check tsh  Titrate med dose if needed  

## 2020-05-27 NOTE — Assessment & Plan Note (Signed)
Chronic Taking vitamin d Check level 

## 2020-05-27 NOTE — Assessment & Plan Note (Signed)
Chronic BP well controlled Current regimen effective and well tolerated Continue current medications at current doses cmp  

## 2020-05-27 NOTE — Assessment & Plan Note (Signed)
Chronic Taking simvastatin daily  Check lipids Eating healthy Very active Continue above

## 2020-05-28 ENCOUNTER — Ambulatory Visit: Payer: PPO | Admitting: Internal Medicine

## 2020-05-28 LAB — COMPREHENSIVE METABOLIC PANEL
AG Ratio: 1.7 (calc) (ref 1.0–2.5)
ALT: 26 U/L (ref 6–29)
AST: 25 U/L (ref 10–35)
Albumin: 4.1 g/dL (ref 3.6–5.1)
Alkaline phosphatase (APISO): 73 U/L (ref 37–153)
BUN: 15 mg/dL (ref 7–25)
CO2: 30 mmol/L (ref 20–32)
Calcium: 9.6 mg/dL (ref 8.6–10.4)
Chloride: 97 mmol/L — ABNORMAL LOW (ref 98–110)
Creat: 0.63 mg/dL (ref 0.60–0.88)
Globulin: 2.4 g/dL (calc) (ref 1.9–3.7)
Glucose, Bld: 87 mg/dL (ref 65–99)
Potassium: 4.6 mmol/L (ref 3.5–5.3)
Sodium: 135 mmol/L (ref 135–146)
Total Bilirubin: 0.4 mg/dL (ref 0.2–1.2)
Total Protein: 6.5 g/dL (ref 6.1–8.1)

## 2020-05-28 LAB — CBC WITH DIFFERENTIAL/PLATELET
Absolute Monocytes: 742 cells/uL (ref 200–950)
Basophils Absolute: 43 cells/uL (ref 0–200)
Basophils Relative: 0.6 %
Eosinophils Absolute: 43 cells/uL (ref 15–500)
Eosinophils Relative: 0.6 %
HCT: 36.6 % (ref 35.0–45.0)
Hemoglobin: 12 g/dL (ref 11.7–15.5)
Lymphs Abs: 698 cells/uL — ABNORMAL LOW (ref 850–3900)
MCH: 31.7 pg (ref 27.0–33.0)
MCHC: 32.8 g/dL (ref 32.0–36.0)
MCV: 96.6 fL (ref 80.0–100.0)
MPV: 10.2 fL (ref 7.5–12.5)
Monocytes Relative: 10.3 %
Neutro Abs: 5674 cells/uL (ref 1500–7800)
Neutrophils Relative %: 78.8 %
Platelets: 250 10*3/uL (ref 140–400)
RBC: 3.79 10*6/uL — ABNORMAL LOW (ref 3.80–5.10)
RDW: 11.9 % (ref 11.0–15.0)
Total Lymphocyte: 9.7 %
WBC: 7.2 10*3/uL (ref 3.8–10.8)

## 2020-05-28 LAB — HEMOGLOBIN A1C
Hgb A1c MFr Bld: 5.7 % of total Hgb — ABNORMAL HIGH (ref <5.7)
Mean Plasma Glucose: 117 (calc)
eAG (mmol/L): 6.5 (calc)

## 2020-05-28 LAB — LIPID PANEL
Cholesterol: 150 mg/dL (ref <200)
HDL: 69 mg/dL (ref >=50)
LDL Cholesterol (Calc): 63 mg/dL (calc)
Non-HDL Cholesterol (Calc): 81 mg/dL (calc) (ref <130)
Total CHOL/HDL Ratio: 2.2 (calc) (ref <5.0)
Triglycerides: 94 mg/dL (ref <150)

## 2020-05-28 LAB — TSH: TSH: 2.23 mIU/L (ref 0.40–4.50)

## 2020-05-28 LAB — VITAMIN D 25 HYDROXY (VIT D DEFICIENCY, FRACTURES): Vit D, 25-Hydroxy: 44 ng/mL (ref 30–100)

## 2020-06-29 DIAGNOSIS — R490 Dysphonia: Secondary | ICD-10-CM | POA: Diagnosis not present

## 2020-06-29 DIAGNOSIS — H938X2 Other specified disorders of left ear: Secondary | ICD-10-CM | POA: Diagnosis not present

## 2020-06-29 DIAGNOSIS — Z923 Personal history of irradiation: Secondary | ICD-10-CM | POA: Diagnosis not present

## 2020-06-29 DIAGNOSIS — Z85810 Personal history of malignant neoplasm of tongue: Secondary | ICD-10-CM | POA: Diagnosis not present

## 2020-06-29 DIAGNOSIS — H6121 Impacted cerumen, right ear: Secondary | ICD-10-CM | POA: Diagnosis not present

## 2020-06-30 ENCOUNTER — Telehealth: Payer: PPO

## 2020-06-30 NOTE — Chronic Care Management (AMB) (Deleted)
Chronic Care Management Pharmacy  Name: CHRISTLE NOLTING  MRN: [id number] DOB: 09-Mar-1931  Chief Complaint/ HPI  Jessica Sherman,  86 y.o. , female presents for their Follow-Up CCM visit with the clinical pharmacist via telephone due to COVID-19 Pandemic.  PCP : [name], MD  Their chronic conditions include: HTN, HLD, hypothyroidism, prediabetes, hx breast cancer and tongue cancer, osteopenia  Pt has large family, children, grandchildren and great-grandchildren all in [place] area. Pt is quite active, still driving herself, shopping for herself. Biggest issue is swallowing.   Office Visits: 05/27/20 Dr Quay Burow OV: chronic f/u, labs stable, no med changes.  11/26/19 Dr Quay Burow: BP slightly elevated in office, did not add med d/t her age d/t risk for hypotension. No med changes.  Consult Visit: 06/29/20 Dr Redmond [facility]eman [facility] f/u for tongue cancer, stable in remission. Hoarseness related to radiation is stable, untreatable. R ear cleared of cerumen and hearing improved.  11/13/19 SLP: HEP procedure WNL, rec'd small bites/sips for dysphagia.  11/03/19 Dr Lindi Adie (heme/onc): breast ca treated 2017-2018, no evidence of recurrence. Tongue cancer treated summer 2020.   Medications: Outpatient Encounter Medications as of 06/30/2020  Medication Sig Note  . amLODipine (NORVASC) 5 MG tablet TAKE 1 TABLET(5 MG) BY MOUTH DAILY   . Calcium Carb-Cholecalciferol (CALCIUM 600+D3 PO) Take 1 tablet by mouth 2 (two) times daily. 12/02/2019: Patient cannot swallow large pill right now  . Cholecalciferol (VITAMIN D3) 1000 UNITS CAPS Take 1,000 Units by mouth daily.    Marland Kitchen levothyroxine (SYNTHROID) 75 MCG tablet TAKE 1 TABLET(75 MCG) BY MOUTH DAILY   . Multiple Vitamins-Minerals (PRESERVISION AREDS PO) Take by mouth.   . simvastatin (ZOCOR) 20 MG tablet Take 1 tablet (20 mg total) by mouth at bedtime.   . sodium fluoride (PREVIDENT 5000 PLUS) 1.1 % CREA dental cream Place gel into fluoride tray at  bedtime.  Place over teeth for 5 minutes.  Remove.  Spit out excess.  Repeat nightly.   . [DISCONTINUED] prochlorperazine (COMPAZINE) 10 MG tablet Take 1 tablet (10 mg total) by mouth every 6 (six) hours as needed (Nausea or vomiting). (Patient not taking: Reported on 07/27/2016)    No facility-administered encounter medications on file as of 06/30/2020.     Current Diagnosis/Assessment:   Goals Addressed   None     Hypertension   BP goal < 140/90  Office blood pressures are  BP Readings from Last 3 Encounters:  05/27/20 138/72  03/26/20 (!) 176/76  11/26/19 (!) 158/62   Patient checks BP at home infrequently  Patient home BP readings are ranging: 155/80  Patient has failed these meds in the past: metoprolol, Patient is currently uncontrolled on the following medications:   amlodipine 5 mg daily  We discussed benefits of checking BP at home; pt's cuff is out of batteries, she will get batteries and start checking BP at home; discussed white coat HTN and effect of stress/anxiety on BP.  Plan  Continue current medications and control with diet and exercise  Recommend check BP at home several times per month  Hyperlipidemia   LDL goal < 70 Aortic atherosclerosis (CT 2017)  Lipid Panel     Component Value Date/Time   CHOL 150 05/27/2020 1122   CHOL 284 (H) 09/15/2014 1018   TRIG 94 05/27/2020 1122   TRIG 175 (H) 09/15/2014 1018   HDL 69 05/27/2020 1122   HDL 65 09/15/2014 1018   CHOLHDL 2.2 05/27/2020 1122   VLDL 14.2 11/26/2019 1048  LDLCALC 63 05/27/2020 1122   LDLCALC 184 (H) 09/15/2014 1018   LDLDIRECT 57.0 01/27/2019 1424   Hepatic Function Latest Ref Rng & Units 05/27/2020 11/26/2019 03/29/2019  Total Protein 6.1 - 8.1 g/dL 6.5 7.2 7.6  Albumin 3.5 - 5.2 g/dL - 3.9 3.6  AST 10 - 35 U/L '25 21 18  ' ALT 6 - 29 U/L '26 18 15  ' Alk Phosphatase 39 - 117 U/L - 63 65  Total Bilirubin 0.2 - 1.2 mg/dL 0.4 0.4 0.8  Bilirubin, Direct 0.0 - 0.3 mg/dL - - -   The  ASCVD Risk score ([place]., et al., 2013) failed to calculate for the following reasons:   The 2013 ASCVD risk score is only valid for ages 27 to 8   Patient has failed these meds in past: n/a Patient is currently controlled on the following medications:   simvastatin 20 mg HS  We discussed:  diet and exercise extensively, cholesterol has been controlled on this dose, pt does not endorse issues with statin.  Plan  Continue current medications and control with diet and exercise   Hypothyroidism   Lab Results  Component Value Date/Time   TSH 2.23 05/27/2020 11:22 AM   TSH 0.89 01/27/2020 10:44 AM   Patient has failed these meds in past: n/a Patient is currently uncontrolled on the following medications:   levothyroxine 75 mcg daily, 1/2 tablet on Wednesdays  We discussed: TSH normalized after dose decrease.  Plan  Continue current medications    Health Maintenance   Patient is currently controlled on the following medications:   Multivitamin gummy,   vitamin D3 1000 IU daily,   Preservision,   Prevident dental cream  We discussed:  Pt has been having swallowing issues lately (has seen SLP), she stopped taking calcium-vitamin D tablet and Preservision due to large pill size. Discussed that alternate dosage forms are available, including Calcium gummies and Preservision chewable tablets.  Plan  Continue current medications Recommend calcium gummies and Preservision chewable   Medication Management   Pt uses [facility] for all medications (90ds Vials) Does not use pill box Pt endorses 100% compliance  We discussed:  Reviewed patient's [facility] medication and Epic medication profile assuring there are no discrepancies or gaps in therapy. Confirmed all fill dates appropriate and verified with patient that there is a sufficient quantity of all prescribed medications at home. Informed patient to call me any time if needing medications before scheduled  deliveries. The anticipated medication sync date was 05/13/20   Plan  Utilize [facility] pharmacy for medication synchronization, packaging and delivery   Follow up: *** month phone visit  Charlene Brooke, PharmD, [facility] Clinical Pharmacist [facility] [phone]

## 2020-07-02 ENCOUNTER — Telehealth: Payer: Self-pay | Admitting: Pharmacist

## 2020-07-02 NOTE — Progress Notes (Signed)
    Chronic Care Management Pharmacy Assistant   Name: GRETEL CANTU  MRN: [id number] DOB: [birth year]  Reason for Encounter: Medication Review  Patient Questions:  1.  Have you seen any other providers since your last visit? Yes, The patient saw Dr. Redmond Baseman on 06/29/2020  2.  Any changes in your medicines or health? No   PCP : [name], MD  Allergies:   Allergies  Allergen Reactions  . Other Nausea And Vomiting    Anesthesia "makes me very sick", Please pre-medicate to control nausea and vomiting    Medications: Outpatient Encounter Medications as of 07/02/2020  Medication Sig Note  . amLODipine (NORVASC) 5 MG tablet TAKE 1 TABLET(5 MG) BY MOUTH DAILY   . Calcium Carb-Cholecalciferol (CALCIUM 600+D3 PO) Take 1 tablet by mouth 2 (two) times daily. 12/02/2019: Patient cannot swallow large pill right now  . Cholecalciferol (VITAMIN D3) 1000 UNITS CAPS Take 1,000 Units by mouth daily.    Marland Kitchen levothyroxine (SYNTHROID) 75 MCG tablet TAKE 1 TABLET(75 MCG) BY MOUTH DAILY   . Multiple Vitamins-Minerals (PRESERVISION AREDS PO) Take by mouth.   . simvastatin (ZOCOR) 20 MG tablet Take 1 tablet (20 mg total) by mouth at bedtime.   . sodium fluoride (PREVIDENT 5000 PLUS) 1.1 % CREA dental cream Place gel into fluoride tray at bedtime.  Place over teeth for 5 minutes.  Remove.  Spit out excess.  Repeat nightly.   . [DISCONTINUED] prochlorperazine (COMPAZINE) 10 MG tablet Take 1 tablet (10 mg total) by mouth every 6 (six) hours as needed (Nausea or vomiting). (Patient not taking: Reported on 07/27/2016)    No facility-administered encounter medications on file as of 07/02/2020.    Current Diagnosis: Patient Active Problem List   Diagnosis Date Noted  . Aortic atherosclerosis ([place]) 05/27/2020  . Hyponatremia 03/28/2019  . Cancer of base of tongue ([place]) 03/24/2019  . Oropharyngeal dysphagia 03/14/2019  . Dysphasia 01/27/2019  . Hypertension 12/13/2016  . S/P mastectomy, left  10/18/2016  . History of breast cancer 10/18/2016  . Port catheter in place 05/23/2016  . Breast cancer of upper-outer quadrant of left female breast ([place]) 04/30/2016  . Conductive hearing loss in right ear 12/09/2015  . Enteritis 05/30/2013  . Prediabetes   . Macular degeneration, dry   . Vertigo   . Vitamin D deficiency 11/17/2008  . COLONIC POLYPS, HX OF 11/17/2008  . Hypothyroidism 11/19/2007  . HYPERLIPIDEMIA 11/19/2007  . Osteopenia 02/18/2007    Goals Addressed   None     Follow-Up:  Coordination of Enhanced Pharmacy Services   Reviewed chart for medication changes ahead of medication coordination call.  On 06/29/2020 the patient saw Dr. Redmond Baseman Otolaryngology.No medication changes indicated.   BP Readings from Last 3 Encounters:  05/27/20 138/72  03/26/20 (!) 176/76  11/26/19 (!) 158/62    Lab Results  Component Value Date   HGBA1C 5.7 (H) 05/27/2020     Patient obtains medications through Vials  90 Days   Last adherence delivery included:  Amlodipine 5mg  daily Simvastatin 20mg  daily Levothyroxine 49mcg daily  Patient is due for next adherence delivery on: 08/10/2020. Called patient and reviewed medications and coordinated delivery.  Patient declined the following medications Amlodipine 5mg  daily Simvastatin 20mg  daily Levothyroxine 48mcg daily  The patient received a 90-day supply on 05/12/2020 supply ends on 08/11/2020   Rosendo Gros, [place] Team Manager/ CPA (Clinical Pharmacist Assistant) [phone]

## 2020-08-03 ENCOUNTER — Telehealth: Payer: Self-pay | Admitting: Pharmacist

## 2020-08-03 NOTE — Progress Notes (Signed)
Chronic Care Management Pharmacy Assistant   Name: Jessica Sherman  MRN: [id number] DOB: 12-11-1930  Reason for Encounter: Medication Review  Patient Questions:  1.  Have you seen any other providers since your last visit? No  2.  Any changes in your medicines or health? No   PCP : [name], MD  Allergies:   Allergies  Allergen Reactions  . Other Nausea And Vomiting    Anesthesia "makes me very sick", Please pre-medicate to control nausea and vomiting    Medications: Outpatient Encounter Medications as of 08/03/2020  Medication Sig Note  . amLODipine (NORVASC) 5 MG tablet TAKE 1 TABLET(5 MG) BY MOUTH DAILY   . Calcium Carb-Cholecalciferol (CALCIUM 600+D3 PO) Take 1 tablet by mouth 2 (two) times daily. 12/02/2019: Patient cannot swallow large pill right now  . Cholecalciferol (VITAMIN D3) 1000 UNITS CAPS Take 1,000 Units by mouth daily.    Marland Kitchen levothyroxine (SYNTHROID) 75 MCG tablet TAKE 1 TABLET(75 MCG) BY MOUTH DAILY   . Multiple Vitamins-Minerals (PRESERVISION AREDS PO) Take by mouth.   . simvastatin (ZOCOR) 20 MG tablet Take 1 tablet (20 mg total) by mouth at bedtime.   . sodium fluoride (PREVIDENT 5000 PLUS) 1.1 % CREA dental cream Place gel into fluoride tray at bedtime.  Place over teeth for 5 minutes.  Remove.  Spit out excess.  Repeat nightly.   . [DISCONTINUED] prochlorperazine (COMPAZINE) 10 MG tablet Take 1 tablet (10 mg total) by mouth every 6 (six) hours as needed (Nausea or vomiting). (Patient not taking: Reported on 07/27/2016)    No facility-administered encounter medications on file as of 08/03/2020.    Current Diagnosis: Patient Active Problem List   Diagnosis Date Noted  . Aortic atherosclerosis ([place]) 05/27/2020  . Hyponatremia 03/28/2019  . Cancer of base of tongue ([place]) 03/24/2019  . Oropharyngeal dysphagia 03/14/2019  . Dysphasia 01/27/2019  . Hypertension 12/13/2016  . S/P mastectomy, left 10/18/2016  . History of breast cancer 10/18/2016    . Port catheter in place 05/23/2016  . Breast cancer of upper-outer quadrant of left female breast ([place]) 04/30/2016  . Conductive hearing loss in right ear 12/09/2015  . Enteritis 05/30/2013  . Prediabetes   . Macular degeneration, dry   . Vertigo   . Vitamin D deficiency 11/17/2008  . COLONIC POLYPS, HX OF 11/17/2008  . Hypothyroidism 11/19/2007  . HYPERLIPIDEMIA 11/19/2007  . Osteopenia 02/18/2007    Goals Addressed   None     Follow-Up:  Coordination of Enhanced Pharmacy Services   Reviewed chart for medication changes ahead of medication coordination call.  No OVs, Consults, or hospital visits since last care coordination call/Pharmacist visit. No medication changes indicated.  BP Readings from Last 3 Encounters:  05/27/20 138/72  03/26/20 (!) 176/76  11/26/19 (!) 158/62    Lab Results  Component Value Date   HGBA1C 5.7 (H) 05/27/2020     Patient obtains medications through Vials  90 Days   Last adherence delivery included:   Amlodipine 5mg  -daily  Simvastatin 20mg -daily  Levothyroxine 23mcg-daily   Patient is due for next adherence delivery on: 08/09/2020. Called patient and reviewed medications and coordinated delivery.  This delivery to include: Amlodipine 5mg  -daily  Simvastatin 20mg -daily  Levothyroxine 51mcg-daily   The patient will need a refill on Levothyroxine 67mcg prescribed by Dr. Billey Gosling a message has been sent to CPP Mendel Ryder to request new RX order.    Confirmed delivery date of 08/09/2020, advised patient that pharmacy will contact  them the morning of delivery.   Rosendo Gros, [facility] Team Manager/ CPA (Clinical Pharmacist Assistant) [phone]

## 2020-08-06 ENCOUNTER — Other Ambulatory Visit: Payer: Self-pay | Admitting: Internal Medicine

## 2020-08-18 ENCOUNTER — Telehealth: Payer: Self-pay | Admitting: Pharmacist

## 2020-08-18 NOTE — Progress Notes (Signed)
Chronic Care Management Pharmacy Assistant   Name: Jessica Sherman  MRN: [id number] DOB: May 22, 1931  Reason for Encounter: Hypertension Adherence Call   PCP : [name], MD  Allergies:   Allergies  Allergen Reactions  . Other Nausea And Vomiting    Anesthesia "makes me very sick", Please pre-medicate to control nausea and vomiting    Medications: Outpatient Encounter Medications as of 08/18/2020  Medication Sig Note  . amLODipine (NORVASC) 5 MG tablet TAKE 1 TABLET(5 MG) BY MOUTH DAILY   . Calcium Carb-Cholecalciferol (CALCIUM 600+D3 PO) Take 1 tablet by mouth 2 (two) times daily. 12/02/2019: Patient cannot swallow large pill right now  . Cholecalciferol (VITAMIN D3) 1000 UNITS CAPS Take 1,000 Units by mouth daily.    Marland Kitchen levothyroxine (SYNTHROID) 75 MCG tablet TAKE ONE TABLET BY MOUTH ONCE DAILY   . Multiple Vitamins-Minerals (PRESERVISION AREDS PO) Take by mouth.   . simvastatin (ZOCOR) 20 MG tablet Take 1 tablet (20 mg total) by mouth at bedtime.   . sodium fluoride (PREVIDENT 5000 PLUS) 1.1 % CREA dental cream Place gel into fluoride tray at bedtime.  Place over teeth for 5 minutes.  Remove.  Spit out excess.  Repeat nightly.   . [DISCONTINUED] prochlorperazine (COMPAZINE) 10 MG tablet Take 1 tablet (10 mg total) by mouth every 6 (six) hours as needed (Nausea or vomiting). (Patient not taking: Reported on 07/27/2016)    No facility-administered encounter medications on file as of 08/18/2020.    Current Diagnosis: Patient Active Problem List   Diagnosis Date Noted  . Aortic atherosclerosis ([place]) 05/27/2020  . Hyponatremia 03/28/2019  . Cancer of base of tongue ([place]) 03/24/2019  . Oropharyngeal dysphagia 03/14/2019  . Dysphasia 01/27/2019  . Hypertension 12/13/2016  . S/P mastectomy, left 10/18/2016  . History of breast cancer 10/18/2016  . Port catheter in place 05/23/2016  . Breast cancer of upper-outer quadrant of left female breast ([place]) 04/30/2016  .  Conductive hearing loss in right ear 12/09/2015  . Enteritis 05/30/2013  . Prediabetes   . Macular degeneration, dry   . Vertigo   . Vitamin D deficiency 11/17/2008  . COLONIC POLYPS, HX OF 11/17/2008  . Hypothyroidism 11/19/2007  . HYPERLIPIDEMIA 11/19/2007  . Osteopenia 02/18/2007    Goals Addressed   None     Follow-Up:  Pharmacist Review   Reviewed chart prior to disease state call. Spoke with patient regarding BP  Recent Office Vitals: BP Readings from Last 3 Encounters:  05/27/20 138/72  03/26/20 (!) 176/76  11/26/19 (!) 158/62   Pulse Readings from Last 3 Encounters:  05/27/20 67  03/26/20 67  11/26/19 (!) 50    Wt Readings from Last 3 Encounters:  05/27/20 100 lb 3.2 oz (45.5 kg)  03/26/20 98 lb 9.6 oz (44.7 kg)  11/26/19 102 lb 12.8 oz (46.6 kg)     Kidney Function Lab Results  Component Value Date/Time   CREATININE 0.63 05/27/2020 11:22 AM   CREATININE 0.73 11/26/2019 10:48 AM   CREATININE 0.64 04/17/2019 12:52 PM   CREATININE 0.75 03/28/2019 11:48 AM   CREATININE 0.9 05/01/2017 10:11 AM   CREATININE 0.9 03/20/2017 09:52 AM   GFR 75.38 11/26/2019 10:48 AM   GFRNONAA >60 04/17/2019 12:52 PM   GFRNONAA >60 03/28/2019 11:48 AM   GFRAA >60 04/17/2019 12:52 PM   GFRAA >60 03/28/2019 11:48 AM    BMP Latest Ref Rng & Units 05/27/2020 11/26/2019 04/17/2019  Glucose 65 - 99 mg/dL 87 102(H) 109(H)  BUN  7 - 25 mg/dL 15 13 10   Creatinine 0.60 - 0.88 mg/dL 0.63 0.73 0.64  BUN/Creat Ratio 6 - 22 (calc) NOT APPLICABLE - -  Sodium 127 - 146 mmol/L 135 135 123(L)  Potassium 3.5 - 5.3 mmol/L 4.6 4.2 4.2  Chloride 98 - 110 mmol/L 97(L) 98 84(L)  CO2 20 - 32 mmol/L 30 31 27   Calcium 8.6 - 10.4 mg/dL 9.6 9.8 10.2    . Current antihypertensive regimen: The patient takes Amlodipine  . How often are you checking your Blood Pressure? The patient states that she does not check her blood pressure when she knows that she should. Patient states that she will get her  daughter who is a nurse to start taking her blood pressure and keep a diary of her readings.  . Current home BP readings: The patient has no blood pressure readings  . What recent interventions/DTPs have been made by any provider to improve Blood Pressure control since last CPP Visit: None  . Any recent hospitalizations or ED visits since last visit with CPP? The patient has not been to the hospital or ED recently for anything  . What diet changes have been made to improve Blood Pressure Control? They patient is not on any special diet  . What exercise is being done to improve your Blood Pressure Control? The patient states that she is very active around the house going up/down stairs   Adherence Review: Is the patient currently on ACE/ARB medication? No Does the patient have >5 day gap between last estimated fill dates? No   Wendy Poet, Clinical Pharmacist Assistant [facility]

## 2020-08-20 DIAGNOSIS — Z961 Presence of intraocular lens: Secondary | ICD-10-CM | POA: Diagnosis not present

## 2020-08-20 DIAGNOSIS — H04123 Dry eye syndrome of bilateral lacrimal glands: Secondary | ICD-10-CM | POA: Diagnosis not present

## 2020-08-20 DIAGNOSIS — H5201 Hypermetropia, right eye: Secondary | ICD-10-CM | POA: Diagnosis not present

## 2020-08-20 DIAGNOSIS — H353132 Nonexudative age-related macular degeneration, bilateral, intermediate dry stage: Secondary | ICD-10-CM | POA: Diagnosis not present

## 2020-08-30 ENCOUNTER — Telehealth: Payer: Self-pay | Admitting: Pharmacist

## 2020-08-30 NOTE — Progress Notes (Addendum)
    Chronic Care Management Pharmacy Assistant   Name: Jessica Sherman  MRN: [id number] DOB: [birth year]  Reason for Encounter: Medication Review   PCP : [name], MD  Allergies:   Allergies  Allergen Reactions   Other Nausea And Vomiting    Anesthesia "makes me very sick", Please pre-medicate to control nausea and vomiting    Medications: Outpatient Encounter Medications as of 08/30/2020  Medication Sig Note   amLODipine (NORVASC) 5 MG tablet TAKE 1 TABLET(5 MG) BY MOUTH DAILY    Calcium Carb-Cholecalciferol (CALCIUM 600+D3 PO) Take 1 tablet by mouth 2 (two) times daily. 12/02/2019: Patient cannot swallow large pill right now   Cholecalciferol (VITAMIN D3) 1000 UNITS CAPS Take 1,000 Units by mouth daily.     levothyroxine (SYNTHROID) 75 MCG tablet TAKE ONE TABLET BY MOUTH ONCE DAILY    Multiple Vitamins-Minerals (PRESERVISION AREDS PO) Take by mouth.    simvastatin (ZOCOR) 20 MG tablet Take 1 tablet (20 mg total) by mouth at bedtime.    sodium fluoride (PREVIDENT 5000 PLUS) 1.1 % CREA dental cream Place gel into fluoride tray at bedtime.  Place over teeth for 5 minutes.  Remove.  Spit out excess.  Repeat nightly.    [DISCONTINUED] prochlorperazine (COMPAZINE) 10 MG tablet Take 1 tablet (10 mg total) by mouth every 6 (six) hours as needed (Nausea or vomiting). (Patient not taking: Reported on 07/27/2016)    No facility-administered encounter medications on file as of 08/30/2020.    Current Diagnosis: Patient Active Problem List   Diagnosis Date Noted   Aortic atherosclerosis ([place]) 05/27/2020   Hyponatremia 03/28/2019   Cancer of base of tongue ([place]) 03/24/2019   Oropharyngeal dysphagia 03/14/2019   Dysphasia 01/27/2019   Hypertension 12/13/2016   S/P mastectomy, left 10/18/2016   History of breast cancer 10/18/2016   Port catheter in place 05/23/2016   Breast cancer of upper-outer quadrant of left female breast ([place]) 04/30/2016   Conductive hearing loss in right  ear 12/09/2015   Enteritis 05/30/2013   Prediabetes    Macular degeneration, dry    Vertigo    Vitamin D deficiency 11/17/2008   COLONIC POLYPS, HX OF 11/17/2008   Hypothyroidism 11/19/2007   HYPERLIPIDEMIA 11/19/2007   Osteopenia 02/18/2007    Goals Addressed   None     Follow-Up:  Coordination of Enhanced Pharmacy Services   Reviewed chart for medication changes ahead of medication coordination call.  No OVs, Consults, or hospital visits since last care coordination call/Pharmacist visit.  No medication changes indicated   BP Readings from Last 3 Encounters:  05/27/20 138/72  03/26/20 (!) 176/76  11/26/19 (!) 158/62    Lab Results  Component Value Date   HGBA1C 5.7 (H) 05/27/2020     Patient obtains medications through Vials  90 Days   Last adherence delivery included:  Amlodipine 5mg  -daily  Simvastatin 20mg -daily  Levothyroxine 32mcg-daily  Patient is due for next adherence delivery on: 11/08/2020.  Called patient and reviewed medications. Patient declines need for any medication refills at this time.   Wendy Poet, Clinical Pharmacist Assistant [facility]

## 2020-09-09 ENCOUNTER — Telehealth: Payer: Self-pay | Admitting: Pharmacist

## 2020-09-09 NOTE — Progress Notes (Signed)
    Chronic Care Management Pharmacy Assistant   Name: Jessica Sherman  MRN: [id number] DOB: Dec 27, 1930  Reason for Encounter: Chart Review   PCP : Pincus Sanes, MD  Allergies:   Allergies  Allergen Reactions  . Other Nausea And Vomiting    Anesthesia "makes me very sick", Please pre-medicate to control nausea and vomiting    Medications: Outpatient Encounter Medications as of 09/09/2020  Medication Sig Note  . amLODipine (NORVASC) 5 MG tablet TAKE 1 TABLET(5 MG) BY MOUTH DAILY   . Calcium Carb-Cholecalciferol (CALCIUM 600+D3 PO) Take 1 tablet by mouth 2 (two) times daily. 12/02/2019: Patient cannot swallow large pill right now  . Cholecalciferol (VITAMIN D3) 1000 UNITS CAPS Take 1,000 Units by mouth daily.    Marland Kitchen levothyroxine (SYNTHROID) 75 MCG tablet TAKE ONE TABLET BY MOUTH ONCE DAILY   . Multiple Vitamins-Minerals (PRESERVISION AREDS PO) Take by mouth.   . simvastatin (ZOCOR) 20 MG tablet Take 1 tablet (20 mg total) by mouth at bedtime.   . sodium fluoride (PREVIDENT 5000 PLUS) 1.1 % CREA dental cream Place gel into fluoride tray at bedtime.  Place over teeth for 5 minutes.  Remove.  Spit out excess.  Repeat nightly.   . [DISCONTINUED] prochlorperazine (COMPAZINE) 10 MG tablet Take 1 tablet (10 mg total) by mouth every 6 (six) hours as needed (Nausea or vomiting). (Patient not taking: Reported on 07/27/2016)    No facility-administered encounter medications on file as of 09/09/2020.    Current Diagnosis: Patient Active Problem List   Diagnosis Date Noted  . Aortic atherosclerosis ([facility]) 05/27/2020  . Hyponatremia 03/28/2019  . Cancer of base of tongue ([facility]) 03/24/2019  . Oropharyngeal dysphagia 03/14/2019  . Dysphasia 01/27/2019  . Hypertension 12/13/2016  . S/P mastectomy, left 10/18/2016  . History of breast cancer 10/18/2016  . Port catheter in place 05/23/2016  . Breast cancer of upper-outer quadrant of left female breast ([facility]) 04/30/2016  . Conductive hearing  loss in right ear 12/09/2015  . Enteritis 05/30/2013  . Prediabetes   . Macular degeneration, dry   . Vertigo   . Vitamin D deficiency 11/17/2008  . COLONIC POLYPS, HX OF 11/17/2008  . Hypothyroidism 11/19/2007  . HYPERLIPIDEMIA 11/19/2007  . Osteopenia 02/18/2007    Goals Addressed   None     Follow-Up:  Pharmacist Review   Horald Pollen, Clinical Pharmacist Assistant [facility]

## 2020-09-29 ENCOUNTER — Telehealth: Payer: Self-pay | Admitting: Pharmacist

## 2020-09-29 NOTE — Progress Notes (Signed)
° ° °  Chronic Care Management Pharmacy Assistant   Name: Jessica Sherman  MRN: [id number] DOB: [birth year]  Reason for Encounter: Medication Review   PCP : [name], MD  Allergies:   Allergies  Allergen Reactions   Other Nausea And Vomiting    Anesthesia "makes me very sick", Please pre-medicate to control nausea and vomiting    Medications: Outpatient Encounter Medications as of 09/29/2020  Medication Sig Note   amLODipine (NORVASC) 5 MG tablet TAKE 1 TABLET(5 MG) BY MOUTH DAILY    Calcium Carb-Cholecalciferol (CALCIUM 600+D3 PO) Take 1 tablet by mouth 2 (two) times daily. 12/02/2019: Patient cannot swallow large pill right now   Cholecalciferol (VITAMIN D3) 1000 UNITS CAPS Take 1,000 Units by mouth daily.     levothyroxine (SYNTHROID) 75 MCG tablet TAKE ONE TABLET BY MOUTH ONCE DAILY    Multiple Vitamins-Minerals (PRESERVISION AREDS PO) Take by mouth.    simvastatin (ZOCOR) 20 MG tablet Take 1 tablet (20 mg total) by mouth at bedtime.    sodium fluoride (PREVIDENT 5000 PLUS) 1.1 % CREA dental cream Place gel into fluoride tray at bedtime.  Place over teeth for 5 minutes.  Remove.  Spit out excess.  Repeat nightly.    [DISCONTINUED] prochlorperazine (COMPAZINE) 10 MG tablet Take 1 tablet (10 mg total) by mouth every 6 (six) hours as needed (Nausea or vomiting). (Patient not taking: Reported on 07/27/2016)    No facility-administered encounter medications on file as of 09/29/2020.    Current Diagnosis: Patient Active Problem List   Diagnosis Date Noted   Aortic atherosclerosis ([place]) 05/27/2020   Hyponatremia 03/28/2019   Cancer of base of tongue ([place]) 03/24/2019   Oropharyngeal dysphagia 03/14/2019   Dysphasia 01/27/2019   Hypertension 12/13/2016   S/P mastectomy, left 10/18/2016   History of breast cancer 10/18/2016   Port catheter in place 05/23/2016   Breast cancer of upper-outer quadrant of left female breast ([place]) 04/30/2016   Conductive  hearing loss in right ear 12/09/2015   Enteritis 05/30/2013   Prediabetes    Macular degeneration, dry    Vertigo    Vitamin D deficiency 11/17/2008   COLONIC POLYPS, HX OF 11/17/2008   Hypothyroidism 11/19/2007   HYPERLIPIDEMIA 11/19/2007   Osteopenia 02/18/2007    Goals Addressed   None       BP Readings from Last 3 Encounters:  05/27/20 138/72  03/26/20 (!) 176/76  11/26/19 (!) 158/62    Lab Results  Component Value Date   HGBA1C 5.7 (H) 05/27/2020     Patient obtains medications through Vials  90 Days   Last adherence delivery included (date:08/06/20): (medication name and frequency) Amlodipine 5mg  -daily  Simvastatin 20mg -daily  Levothyroxine 110mcg-daily  Patient declined Amlodipine 5mg  -daily  Simvastatin 20mg -daily  Levothyroxine 63mcg-daily  last delivery due to patient received a 90 DS   Patient is due for next adherence delivery on: 11/08/20. Called patient and reviewed medication needs.  Patient declined need for refills at this time. Patient is aware of how to contact pharmacist if refills are needed prior to next adherence delivery.   Wendy Poet, [place] [phone]

## 2020-10-25 ENCOUNTER — Other Ambulatory Visit: Payer: Self-pay | Admitting: Internal Medicine

## 2020-10-27 ENCOUNTER — Telehealth: Payer: Self-pay | Admitting: Pharmacist

## 2020-10-27 NOTE — Progress Notes (Signed)
    Chronic Care Management Pharmacy Assistant   Name: Jessica Sherman  MRN: [id number] DOB: 1931-03-09  Reason for Encounter: Medication Review  PCP : [name], MD  Allergies:   Allergies  Allergen Reactions  . Other Nausea And Vomiting    Anesthesia "makes me very sick", Please pre-medicate to control nausea and vomiting    Medications: Outpatient Encounter Medications as of 10/27/2020  Medication Sig Note  . amLODipine (NORVASC) 5 MG tablet TAKE 1 TABLET(5 MG) BY MOUTH DAILY   . Calcium Carb-Cholecalciferol (CALCIUM 600+D3 PO) Take 1 tablet by mouth 2 (two) times daily. 12/02/2019: Patient cannot swallow large pill right now  . Cholecalciferol (VITAMIN D3) 1000 UNITS CAPS Take 1,000 Units by mouth daily.    Marland Kitchen levothyroxine (SYNTHROID) 75 MCG tablet TAKE ONE TABLET BY MOUTH ONCE DAILY   . Multiple Vitamins-Minerals (PRESERVISION AREDS PO) Take by mouth.   . simvastatin (ZOCOR) 20 MG tablet TAKE ONE TABLET BY MOUTH EVERYDAY AT BEDTIME   . sodium fluoride (PREVIDENT 5000 PLUS) 1.1 % CREA dental cream Place gel into fluoride tray at bedtime.  Place over teeth for 5 minutes.  Remove.  Spit out excess.  Repeat nightly.   . [DISCONTINUED] prochlorperazine (COMPAZINE) 10 MG tablet Take 1 tablet (10 mg total) by mouth every 6 (six) hours as needed (Nausea or vomiting). (Patient not taking: Reported on 07/27/2016)    No facility-administered encounter medications on file as of 10/27/2020.    Current Diagnosis: Patient Active Problem List   Diagnosis Date Noted  . Aortic atherosclerosis ([place]) 05/27/2020  . Hyponatremia 03/28/2019  . Cancer of base of tongue ([place]) 03/24/2019  . Oropharyngeal dysphagia 03/14/2019  . Dysphasia 01/27/2019  . Hypertension 12/13/2016  . S/P mastectomy, left 10/18/2016  . History of breast cancer 10/18/2016  . Port catheter in place 05/23/2016  . Breast cancer of upper-outer quadrant of left female breast ([place]) 04/30/2016  . Conductive hearing loss  in right ear 12/09/2015  . Enteritis 05/30/2013  . Prediabetes   . Macular degeneration, dry   . Vertigo   . Vitamin D deficiency 11/17/2008  . COLONIC POLYPS, HX OF 11/17/2008  . Hypothyroidism 11/19/2007  . HYPERLIPIDEMIA 11/19/2007  . Osteopenia 02/18/2007    Reviewed chart for medication changes ahead of medication coordination call.  No OVs, Consults, or hospital visits since last care coordination call/Pharmacist visit. (If appropriate, list visit date, provider name)  No medication changes indicated OR if recent visit, treatment plan here.  BP Readings from Last 3 Encounters:  05/27/20 138/72  03/26/20 (!) 176/76  11/26/19 (!) 158/62    Lab Results  Component Value Date   HGBA1C 5.7 (H) 05/27/2020     Patient obtains medications through Vials  90 Days   Last adherence delivery included: (medication name and frequency)   Patient is due for next adherence delivery on: 11-05-20. Called patient and reviewed medications and coordinated delivery.  This delivery to include: Simvastatin 20mg  Take one tab every day at bedtime Levothyroxin 86mcg Take one tab by mouth once daily Amlodipine 5mg  Tab Take one tab by mouth once daily   Confirmed delivery date of 11-05-20, advised patient that pharmacy will contact them the morning of delivery.  Georgiana Shore ,[place] Pharmacist Assistant [phone]  Follow-Up:  Pharmacist Review

## 2020-10-29 ENCOUNTER — Other Ambulatory Visit: Payer: Self-pay

## 2020-10-29 ENCOUNTER — Ambulatory Visit
Admission: RE | Admit: 2020-10-29 | Discharge: 2020-10-29 | Disposition: A | Discharge status: Home / Self Care | Payer: PPO | Source: Ambulatory Visit | Attending: Radiation Oncology | Admitting: Radiation Oncology

## 2020-10-29 VITALS — BP 169/67 | HR 65 | Temp 97.6°F | Resp 16 | Ht 63.0 in | Wt 105.0 lb

## 2020-10-29 DIAGNOSIS — Z85810 Personal history of malignant neoplasm of tongue: Secondary | ICD-10-CM | POA: Diagnosis not present

## 2020-10-29 DIAGNOSIS — C01 Malignant neoplasm of base of tongue: Secondary | ICD-10-CM

## 2020-10-29 DIAGNOSIS — Z923 Personal history of irradiation: Secondary | ICD-10-CM | POA: Insufficient documentation

## 2020-10-29 DIAGNOSIS — Z08 Encounter for follow-up examination after completed treatment for malignant neoplasm: Secondary | ICD-10-CM | POA: Diagnosis not present

## 2020-10-29 MED ORDER — OXYMETAZOLINE HCL 0.05 % NA SOLN
1.0000 | Freq: Once | NASAL | Status: AC
  Administered 2020-10-29: 1 via NASAL
  Filled 2020-10-29: qty 30

## 2020-10-29 NOTE — Progress Notes (Signed)
Jessica Sherman presents today for follow up of radiation to her base of tongue completed on 06/02/2019  Pain issues, if any: Patient denies Using a feeding tube?: N/A Weight changes, if any:  Wt Readings from Last 3 Encounters:  10/29/20 105 lb (47.6 kg)  05/27/20 100 lb 3.2 oz (45.5 kg)  03/26/20 98 lb 9.6 oz (44.7 kg)   Swallowing issues, if any: Patient denies, she feels she can eat a wide variety of foods (though she still avoids steak). Denies any issues with thin liquids Smoking or chewing tobacco? None Using fluoride trays daily? Occasionally; Sees her community dentist regularly (next appointment 11/2020) Last ENT visit was on: 06/29/2020 Saw Dr. Melida Quitter: "Impression & Plans:  Jessica Sherman is a 87 y.o. female with right cerumen impaction with conductive hearing loss, history of tongue base cancer, and dysphonia. - There is no evidence of cancer recurrence now over one year from completing treatment. Her cancer history represents a stable chronic illness. She was instructed to follow-up with Dr. Isidore Moos in three months and me in six months. - Her hoarseness is related to her radiation treatments. This represents a stable chronic illness. She was reassured but there is not effective treatment. - Her right ear was cleared of obstructing cerumen via a minor procedure and hearing improved. This is a minor problem."  Other notable issues, if any: Reports occasional vocal hoarseness. Denies any issues with ear or jaw or jaw pain, or difficulty opening her mouth. Denies any symptoms of lymphedema. Denies any issues with dry mouth or thick saliva. Denies any issues sleeping and overall is very pleased with her recovery thus far  Vitals:   10/29/20 1408  BP: (!) 169/67  Pulse: 65  Resp: 16  Temp: 97.6 F (36.4 C)  SpO2: 100%

## 2020-11-01 ENCOUNTER — Telehealth: Payer: Self-pay | Admitting: Medical

## 2020-11-01 NOTE — Telephone Encounter (Signed)
Scheduled appt per 2/21 sch msg - mailed letter with appt date and time

## 2020-11-01 NOTE — Progress Notes (Incomplete)
Patient Care Team: [name], MD as PCP - General (Internal Medicine) Jessica Sherman, Jessica Massed, NP as Nurse Practitioner (Hematology and Oncology) [name], MD as Consulting Physician (Hematology and Oncology) Jessica Sherman, Jessica Pascal, MD as Consulting Physician (Cardiology) Jessica Keens, MD as Consulting Physician (General Surgery) Jessica Gala, MD as Consulting Physician (Otolaryngology) Jessica Gibson, MD as Attending Physician (Radiation Oncology) Jessica Sauers, RN (Inactive) as Oncology Nurse Navigator Jessica Sherman, Jessica Sherman, Jessica Sherman as Speech Language Pathologist (Speech Pathology) Jessica Sherman, RD as Dietitian (Nutrition) Jessica Sherman, [facility] as Pharmacist (Pharmacist)  DIAGNOSIS:    ICD-10-CM   1. Cancer of base of tongue ([place])  C01     SUMMARY OF ONCOLOGIC HISTORY: Oncology History  Breast cancer of upper-outer quadrant of left female breast ([place])  04/12/2016 Initial Diagnosis   Left breast biopsy: IDC grade 2-3, left axillary LN positive for metastatic IDC; mammogram revealed a 5 cm solid mass left breast 12:00 anterior depth, ER 0%, PR 0%, Ki-67 80%, HER-2 positive ratio 7.6, copy #16.5   05/10/2016 Imaging   Bone scan normal; CT CAP: left breast cancer, left axillary and subpectoral nodes are not pathologic by size criteriabut suspicious, no metastatic disease   05/16/2016 Breast MRI   Left breast solid with cystic appearance OUQ 1:00 crossing into the medial left breast 4.7 x 3.3 x 4.2 cm; second 58m nodule left breast UOQ: Posterior intramammary/low axillary lymph node, indeterminate left axillary lymph nodes, skin thickening   05/17/2016 - 08/29/2016 Neo-Adjuvant Chemotherapy   Taxol weekly 12 Herceptin and Perjeta every 3 weeks 6   09/05/2016 Breast MRI   Breast MRI: Complete Imaging response   10/18/2016 Surgery   Left modified radical mastectomy: Complete response to treatment 0/9 lymph nodes negative [id number]    03/12/2019 Relapse/Recurrence    Patient had dysphagia and pain in neck. CT neck showed a large mass at the base of the tongue compatible with carcinoma and a 9 mm right level 2 lymph node which shows increased enhancement and is suspicious for metastatic disease. PET scan showed the large right tongue base mass consistent with neoplasm, a 417mpulmonary nodule that was indeterminate. No evidence of metastases in the chest, abdomen or pelvis.   Cancer of base of tongue ([facility] 03/13/2019 Imaging   CT neck w/ contrast: IMPRESSION: Large mass lesion base of tongue on the right compatible with carcinoma.   9 mm right level 2 lymph node which shows increased enhancement and is suspicious for metastatic disease. Correlate with PET-CT.   03/19/2019 Imaging   PET: IMPRESSION: 1. Large right tongue base mass is markedly hypermetabolic and consistent with neoplasm. 2. No neck adenopathy or evidence of metastatic disease in the chest, abdomen or pelvis. 3. Single 4 mm nodule in the right middle lobe is indeterminate. Recommend follow-up noncontrast chest CT in 6-12 months. 4. Surgical changes from a left mastectomy. No chest wall mass or supraclavicular or axillary adenopathy.   03/21/2019 Cancer Staging   Staging form: Pharynx - HPV-Mediated Oropharynx, AJCC 8th Edition - Clinical stage from 03/21/2019: Stage I (cT2, cN1, cM0, p16+) - Signed by SqEppie GibsonMD on 06/18/2019   03/24/2019 Initial Diagnosis   Cancer of base of tongue ([facility]    CHIEF COMPLIANT: Surveillance of breast cancer  INTERVAL HISTORY: Jessica PORTNOYs a [age].o. with above-mentioned history of HER2 positiveleft breast cancer treated with neoadjuvant chemotherapy with complete response, leftmastectomy, and adjuvant Herceptin.She is currently followed by Dr. ZhMaylon Peppersor cancer of  the base of the tongue. She presents to the clinic todayfor follow-up.   ALLERGIES:  is allergic to other.  MEDICATIONS:  Current Outpatient Medications  Medication Sig  Dispense Refill  . amLODipine (NORVASC) 5 MG tablet TAKE 1 TABLET(5 MG) BY MOUTH DAILY 90 tablet 3  . Calcium Carb-Cholecalciferol (CALCIUM 600+D3 PO) Take 1 tablet by mouth 2 (two) times daily.    . Cholecalciferol (VITAMIN D3) 1000 UNITS CAPS Take 1,000 Units by mouth daily.     Marland Kitchen levothyroxine (SYNTHROID) 75 MCG tablet TAKE ONE TABLET BY MOUTH ONCE DAILY 90 tablet 1  . Multiple Vitamins-Minerals (PRESERVISION AREDS PO) Take by mouth.    . simvastatin (ZOCOR) 20 MG tablet TAKE ONE TABLET BY MOUTH EVERYDAY AT BEDTIME 90 tablet 1  . sodium fluoride (PREVIDENT 5000 PLUS) 1.1 % CREA dental cream Place gel into fluoride tray at bedtime.  Place over teeth for 5 minutes.  Remove.  Spit out excess.  Repeat nightly. 1 Tube prn   No current facility-administered medications for this visit.    PHYSICAL EXAMINATION: ECOG PERFORMANCE STATUS: {CHL ONC ECOG [phone]}  There were no vitals filed for this visit. There were no vitals filed for this visit.  BREAST:*** No palpable masses or nodules in either right or left breasts. No palpable axillary supraclavicular or infraclavicular adenopathy no breast tenderness or nipple discharge. (exam performed in the presence of a chaperone)  LABORATORY DATA:  I have reviewed the data as listed CMP Latest Ref Rng & Units 05/27/2020 11/26/2019 04/17/2019  Glucose 65 - 99 mg/dL 87 102(H) 109(H)  BUN 7 - 25 mg/dL '15 13 10  ' Creatinine 0.60 - 0.88 mg/dL 0.63 0.73 0.64  Sodium 135 - 146 mmol/L 135 135 123(L)  Potassium 3.5 - 5.3 mmol/L 4.6 4.2 4.2  Chloride 98 - 110 mmol/L 97(L) 98 84(L)  CO2 20 - 32 mmol/L '30 31 27  ' Calcium 8.6 - 10.4 mg/dL 9.6 9.8 10.2  Total Protein 6.1 - 8.1 g/dL 6.5 7.2 -  Total Bilirubin 0.2 - 1.2 mg/dL 0.4 0.4 -  Alkaline Phos 39 - 117 U/L - 63 -  AST 10 - 35 U/L 25 21 -  ALT 6 - 29 U/L 26 18 -    Lab Results  Component Value Date   WBC 7.2 05/27/2020   HGB 12.0 05/27/2020   HCT 36.6 05/27/2020   MCV 96.6 05/27/2020   PLT 250  05/27/2020   NEUTROABS 5,674 05/27/2020    ASSESSMENT & PLAN:  No problem-specific Assessment & Plan notes found for this encounter.    No orders of the defined types were placed in this encounter.  The patient has a good understanding of the overall plan. she agrees with it. she will call with any problems that may develop before the next visit here.  Total time spent: *** mins including face to face time and time spent for planning, charting and coordination of care  Rulon Eisenmenger, MD, MPH 11/01/2020  I, Molly Dorshimer, am acting as scribe for Dr. [name].  {insert scribe attestation}

## 2020-11-01 NOTE — Assessment & Plan Note (Deleted)
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 Isidore Moos 04/14/2019-06/02/2019 (required PEG tube) PET CT scan 10/01/2019: Base of tongue mass is no longer visualized.  Based on the PET scans patient is in remission. She will follow with radiation oncology and ENT for her surveillance checkups.  Return to clinic in 1 year for follow-up

## 2020-11-02 ENCOUNTER — Inpatient Hospital Stay: Payer: PPO | Attending: Hematology and Oncology | Admitting: Hematology and Oncology

## 2020-11-02 DIAGNOSIS — C01 Malignant neoplasm of base of tongue: Secondary | ICD-10-CM

## 2020-11-04 ENCOUNTER — Encounter: Payer: Self-pay | Admitting: Radiation Oncology

## 2020-11-04 NOTE — Progress Notes (Signed)
Radiation Oncology         [phone] ________________________________  Name: Jessica Sherman MRN: [id number]  Date: 10/29/2020  DOB: Aug 07, 1931  Follow-Up Visit Note  Outpatient, in person.  CC: Jessica Rail, MD  Jessica Rail, MD  Diagnosis and Prior Radiotherapy:       ICD-10-CM   1. Cancer of base of tongue (HCC)  C01 oxymetazoline (AFRIN) 0.05 % nasal spray 1 spray    Fiberoptic laryngoscopy    Cancer Staging Breast cancer of upper-outer quadrant of left female breast ([place]) Staging form: Breast, AJCC 7th Edition - Clinical: Stage IIB (T2, N1, M0) - Unsigned - Pathologic: yT0, N0, cM0 - Unsigned Stage prefix: Post-therapy  Cancer of base of tongue (HCC) Staging form: Pharynx - HPV-Mediated Oropharynx, AJCC 8th Edition - Clinical stage from 03/21/2019: Stage I (cT2, cN1, cM0, p16+) - Signed by Jessica Gibson, MD on 06/18/2019   04/14/2019 through 06/02/2019  Site Technique Total Dose Dose per Fx Completed Fx Beam Energies  Head & neck: HN_BOT IMRT 70/70 2 35/35 6X   CHIEF COMPLAINT:  Here for follow-up and surveillance of base of tongue cancer   Narrative:      [name] presents today for follow up of radiation to her base of tongue completed on 06/02/2019  Pain issues, if any: Patient denies Using a feeding tube?: N/A Weight changes, if any:  Wt Readings from Last 3 Encounters:  10/29/20 105 lb (47.6 kg)  05/27/20 100 lb 3.2 oz (45.5 kg)  03/26/20 98 lb 9.6 oz (44.7 kg)   Swallowing issues, if any: Patient denies, she feels she can eat a wide variety of foods (though she still avoids steak). Denies any issues with thin liquids Smoking or chewing tobacco? None Using fluoride trays daily? Occasionally; Sees her community dentist regularly (next appointment 11/2020) Last ENT visit was on: 06/29/2020 Saw Dr. Melida Quitter: "Impression & Plans:  Jessica Sherman is a 87 y.o. female with right cerumen impaction with conductive hearing loss, history of tongue  base cancer, and dysphonia. - There is no evidence of cancer recurrence now over one year from completing treatment. Her cancer history represents a stable chronic illness. She was instructed to follow-up with Dr. Isidore Moos in three months and me in six months. - Her hoarseness is related to her radiation treatments. This represents a stable chronic illness. She was reassured but there is not effective treatment. - Her right ear was cleared of obstructing cerumen via a minor procedure and hearing improved. This is a minor problem."  Other notable issues, if any: Reports occasional vocal hoarseness. Denies any issues with ear or jaw or jaw pain, or difficulty opening her mouth. Denies any symptoms of lymphedema. Denies any issues with dry mouth or thick saliva. Denies any issues sleeping and overall is very pleased with her recovery thus far  Vitals:   10/29/20 1408  BP: (!) 169/67  Pulse: 65  Resp: 16  Temp: 97.6 F (36.4 C)  SpO2: 100%      ALLERGIES:  is allergic to other.  Meds: Current Outpatient Medications  Medication Sig Dispense Refill  . amLODipine (NORVASC) 5 MG tablet TAKE 1 TABLET(5 MG) BY MOUTH DAILY 90 tablet 3  . Calcium Carb-Cholecalciferol (CALCIUM 600+D3 PO) Take 1 tablet by mouth 2 (two) times daily.    . Cholecalciferol (VITAMIN D3) 1000 UNITS CAPS Take 1,000 Units by mouth daily.     Marland Kitchen levothyroxine (SYNTHROID) 75 MCG tablet TAKE ONE TABLET BY MOUTH ONCE  DAILY 90 tablet 1  . Multiple Vitamins-Minerals (PRESERVISION AREDS PO) Take by mouth.    . simvastatin (ZOCOR) 20 MG tablet TAKE ONE TABLET BY MOUTH EVERYDAY AT BEDTIME 90 tablet 1  . sodium fluoride (PREVIDENT 5000 PLUS) 1.1 % CREA dental cream Place gel into fluoride tray at bedtime.  Place over teeth for 5 minutes.  Remove.  Spit out excess.  Repeat nightly. 1 Tube prn   No current facility-administered medications for this encounter.    Physical Findings: The patient is in no acute distress. Patient is alert  and oriented. Wt Readings from Last 3 Encounters:  10/29/20 105 lb (47.6 kg)  05/27/20 100 lb 3.2 oz (45.5 kg)  03/26/20 98 lb 9.6 oz (44.7 kg)    height is 5\' 3"  (1.6 m) and weight is 105 lb (47.6 kg). Her temperature is 97.6 F (36.4 C). Her blood pressure is 169/67 (abnormal) and her pulse is 65. Her respiration is 16 and oxygen saturation is 100%. .  General: Alert and oriented, in no acute distress  HEENT:  Oropharynx and oral cavity are clear Neck: is without palpable cervical or supraclavicular masses Skin: skin on neck is warm, dry, intact, no lesions.   PROCEDURE NOTE: After obtaining consent and anesthetizing the nasal cavity with topical decongestant, the flexible endoscope was introduced and passed through the nasal cavity.  No lesions appreciated in the pharynx, hypopharynx, or larynx.  The cords are symmetrically mobile without lesions.    Lab Findings: Lab Results  Component Value Date   WBC 7.2 05/27/2020   HGB 12.0 05/27/2020   HCT 36.6 05/27/2020   MCV 96.6 05/27/2020   PLT 250 05/27/2020    Lab Results  Component Value Date   TSH 2.23 05/27/2020    Radiographic Findings: No results found.  Impression/Plan:    1) Head and Neck Cancer Status: NED  2) Nutritional Status: eating well, gaining weight PEG tube: has been removed  3) Swallowing: good function, continue exercises via SLP  4) Dental: Encouraged to continue regular followup with dentistry and regular dental hygiene  5)  Thyroid function:  Take levothyroxine per outside MD (preceding condition). Energy is great. Lab Results  Component Value Date   TSH 2.23 05/27/2020    6) We will facilitate f/u appt scheduling with Dr. Redmond Baseman in June. She will see H+N survivorship at the [facility] in August. I will see her back in Feb 2023.  7) We discussed measures to reduce the risk of infection during the COVID-19 pandemic. She has been vaccinated.  On date of service, in total, I spent 25  minutes on this encounter. Patient was seen in person.  _____________________________________   Jessica Gibson, MD

## 2020-11-04 NOTE — Progress Notes (Signed)
Oncology Nurse Navigator Documentation  Per patient's 10/29/20 post-treatment follow-up with Dr. Isidore Moos, sent fax to [facility] ENT Scheduling with request Ms. Zoeann be contacted and scheduled for routine post-RT follow-up with Dr. Redmond Baseman in June 2022.  Notification of successful fax transmission received.  Harlow Asa RN, BSN, OCN Head & Neck Oncology Nurse [place] [facility] Phone # [phone]  Fax # [phone]

## 2020-11-24 NOTE — Progress Notes (Signed)
Subjective:    Patient ID: Jessica Sherman, female    DOB: Nov 29, 1930, 87 y.o.   MRN: [id number]  HPI The patient is here for follow up of their chronic medical problems, including htn, hyperlipidemia, hypothyroidism, prediabetes, h/o breast cancer, h/o cancer at the base of the tongue   She is taking all of her medications as prescribed.     Medications and allergies reviewed with patient and updated if appropriate.  Patient Active Problem List   Diagnosis Date Noted  . Aortic atherosclerosis ([place]) 05/27/2020  . Hyponatremia 03/28/2019  . Cancer of base of tongue ([place]) 03/24/2019  . Oropharyngeal dysphagia 03/14/2019  . Dysphasia 01/27/2019  . Hypertension 12/13/2016  . S/P mastectomy, left 10/18/2016  . History of breast cancer 10/18/2016  . Port catheter in place 05/23/2016  . Breast cancer of upper-outer quadrant of left female breast ([place]) 04/30/2016  . Conductive hearing loss in right ear 12/09/2015  . Enteritis 05/30/2013  . Prediabetes   . Macular degeneration, dry   . Vertigo   . Vitamin D deficiency 11/17/2008  . COLONIC POLYPS, HX OF 11/17/2008  . Hypothyroidism 11/19/2007  . HYPERLIPIDEMIA 11/19/2007  . Osteopenia 02/18/2007    Current Outpatient Medications on File Prior to Visit  Medication Sig Dispense Refill  . amLODipine (NORVASC) 5 MG tablet TAKE 1 TABLET(5 MG) BY MOUTH DAILY 90 tablet 3  . Calcium Carb-Cholecalciferol (CALCIUM 600+D3 PO) Take 1 tablet by mouth 2 (two) times daily.    . Cholecalciferol (VITAMIN D3) 1000 UNITS CAPS Take 1,000 Units by mouth daily.     Marland Kitchen levothyroxine (SYNTHROID) 75 MCG tablet TAKE ONE TABLET BY MOUTH ONCE DAILY 90 tablet 1  . Multiple Vitamins-Minerals (PRESERVISION AREDS PO) Take by mouth.    . simvastatin (ZOCOR) 20 MG tablet TAKE ONE TABLET BY MOUTH EVERYDAY AT BEDTIME 90 tablet 1  . sodium fluoride (PREVIDENT 5000 PLUS) 1.1 % CREA dental cream Place gel into fluoride tray at bedtime.  Place over teeth for 5  minutes.  Remove.  Spit out excess.  Repeat nightly. 1 Tube prn  . [DISCONTINUED] prochlorperazine (COMPAZINE) 10 MG tablet Take 1 tablet (10 mg total) by mouth every 6 (six) hours as needed (Nausea or vomiting). (Patient not taking: Reported on 07/27/2016) 30 tablet 1   No current facility-administered medications on file prior to visit.    Past Medical History:  Diagnosis Date  . Hx of colonic polyps   . Hyperlipidemia   . Hypothyroidism   . Macular degeneration, dry   . Osteopenia    spine BMD T score - 1.69  . PONV (postoperative nausea and vomiting)    pt has vertigo and can not lie flat  . Prediabetes    FBS 136  on 08/29/2010; A1c 6.2%  . Vertigo     Past Surgical History:  Procedure Laterality Date  . CATARACT EXTRACTION  2009   OS  . COLONOSCOPY W/ POLYPECTOMY     X 2; last 2012. Dr Watt Climes  . IR GASTROSTOMY TUBE MOD SED  04/17/2019  . IR GASTROSTOMY TUBE REMOVAL  08/29/2019  . IR GENERIC HISTORICAL  05/09/2016   IR US GUIDE VASC ACCESS RIGHT 05/09/2016 Jacqulynn Cadet, MD [facility]-INTER[facility]  . IR GENERIC HISTORICAL  05/09/2016   IR FLUORO GUIDE CV LINE RIGHT 05/09/2016 Jacqulynn Cadet, MD [facility]INTER[facility]  . MODIFIED MASTECTOMY Left 10/18/2016   Procedure: LEFT MODIFIED RADICAL MASTECTOMY;  Surgeon: [name], MD;  Location: [facility];  Service:  General;  Laterality: Left;  . PILONIDAL CYST EXCISION     X 3  . TONSILLECTOMY AND ADENOIDECTOMY    . TOTAL ABDOMINAL HYSTERECTOMY     w/ bladder repair    Social History   Socioeconomic History  . Marital status: Widowed    Spouse name: Not on file  . Number of children: 4  . Years of education: Not on file  . Highest education level: Not on file  Occupational History  . Occupation: retired  Tobacco Use  . Smoking status: Never Smoker  . Smokeless tobacco: Never Used  Vaping Use  . Vaping Use: Never used  Substance and Sexual Activity  . Alcohol use: No    Comment:    . Drug use: No  . Sexual  activity: Not on file  Other Topics Concern  . Not on file  Social History Narrative   Regular exercise- yes 71mpd w/o symptoms   Social Determinants of Health   Financial Resource Strain: Low Risk   . Difficulty of Paying Living Expenses: Not hard at all  Food Insecurity: Not on file  Transportation Needs: Not on file  Physical Activity: Not on file  Stress: Not on file  Social Connections: Not on file    Family History  Problem Relation Age of Onset  . Osteoporosis Mother   . Leukemia Mother   . Cancer Maternal Uncle        ? stomach  . Aneurysm Father        AAA  . Heart attack Neg Hx     Review of Systems     Objective:  There were no vitals filed for this visit. BP Readings from Last 3 Encounters:  10/29/20 (!) 169/67  05/27/20 138/72  03/26/20 (!) 176/76   Wt Readings from Last 3 Encounters:  10/29/20 105 lb (47.6 kg)  05/27/20 100 lb 3.2 oz (45.5 kg)  03/26/20 98 lb 9.6 oz (44.7 kg)   There is no height or weight on file to calculate BMI.   Physical Exam    Constitutional: Appears well-developed and well-nourished. No distress.  HENT:  Head: Normocephalic and atraumatic.  Neck: Neck supple. No tracheal deviation present. No thyromegaly present.  No cervical lymphadenopathy Cardiovascular: Normal rate, regular rhythm and normal heart sounds.   No murmur heard. No carotid bruit .  No edema Pulmonary/Chest: Effort normal and breath sounds normal. No respiratory distress. No has no wheezes. No rales.  Skin: Skin is warm and dry. Not diaphoretic.  Psychiatric: Normal mood and affect. Behavior is normal.      Assessment & Plan:    See Problem List for Assessment and Plan of chronic medical problems.    This visit occurred during the SARS-CoV-2 public health emergency.  Safety protocols were in place, including screening questions prior to the visit, additional usage of staff PPE, and extensive cleaning of exam room while observing appropriate contact  time as indicated for disinfecting solutions.    This encounter was created in error - please disregard.

## 2020-11-24 NOTE — Patient Instructions (Signed)
  Blood work was ordered.     Medications changes include :     Your prescription(s) have been submitted to your pharmacy. Please take as directed and contact our office if you believe you are having problem(s) with the medication(s).   A referral was ordered for        Someone from their office will call you to schedule an appointment.    Please followup in 6 months  

## 2020-11-25 ENCOUNTER — Other Ambulatory Visit: Payer: Self-pay

## 2020-11-25 ENCOUNTER — Encounter: Payer: PPO | Admitting: Internal Medicine

## 2020-11-25 DIAGNOSIS — E782 Mixed hyperlipidemia: Secondary | ICD-10-CM

## 2020-11-25 DIAGNOSIS — I10 Essential (primary) hypertension: Secondary | ICD-10-CM

## 2020-11-25 DIAGNOSIS — E039 Hypothyroidism, unspecified: Secondary | ICD-10-CM

## 2020-11-25 DIAGNOSIS — R7303 Prediabetes: Secondary | ICD-10-CM

## 2020-11-25 DIAGNOSIS — I700 Atherosclerosis of aorta: Secondary | ICD-10-CM

## 2020-12-01 ENCOUNTER — Telehealth: Payer: Self-pay | Admitting: Pharmacist

## 2020-12-01 NOTE — Progress Notes (Signed)
    Chronic Care Management Pharmacy Assistant   Name: Jessica Sherman  MRN: [id number] DOB: [birth year]  Reason for Encounter: Medication Coordination    Medications: Outpatient Encounter Medications as of 12/01/2020  Medication Sig Note  . amLODipine (NORVASC) 5 MG tablet TAKE 1 TABLET(5 MG) BY MOUTH DAILY   . Calcium Carb-Cholecalciferol (CALCIUM 600+D3 PO) Take 1 tablet by mouth 2 (two) times daily. 12/02/2019: Patient cannot swallow large pill right now  . Cholecalciferol (VITAMIN D3) 1000 UNITS CAPS Take 1,000 Units by mouth daily.    Marland Kitchen levothyroxine (SYNTHROID) 75 MCG tablet TAKE ONE TABLET BY MOUTH ONCE DAILY   . Multiple Vitamins-Minerals (PRESERVISION AREDS PO) Take by mouth.   . simvastatin (ZOCOR) 20 MG tablet TAKE ONE TABLET BY MOUTH EVERYDAY AT BEDTIME   . sodium fluoride (PREVIDENT 5000 PLUS) 1.1 % CREA dental cream Place gel into fluoride tray at bedtime.  Place over teeth for 5 minutes.  Remove.  Spit out excess.  Repeat nightly.   . [DISCONTINUED] prochlorperazine (COMPAZINE) 10 MG tablet Take 1 tablet (10 mg total) by mouth every 6 (six) hours as needed (Nausea or vomiting). (Patient not taking: Reported on 07/27/2016)    No facility-administered encounter medications on file as of 12/01/2020.    Reviewed chart for medication changes ahead of medication coordination call.  No OVs, Consults, or hospital visits since last care coordination call/Pharmacist visit. (If appropriate, list visit date, provider name)  No medication changes indicated OR if recent visit, treatment plan here.  BP Readings from Last 3 Encounters:  10/29/20 (!) 169/67  05/27/20 138/72  03/26/20 (!) 176/76    Lab Results  Component Value Date   HGBA1C 5.7 (H) 05/27/2020     Patient obtains medications through Vials  90 Days   Last adherence delivery included:  Simvastatin 20 mg Take one tablet everyday at bedtime Amlodipine 5 mg Tab Take one tablet by mouth once daily Levothyroxine 75  mcg Take one tablet by mouth once daily    Called patient and reviewed medications . Patient is not due for a delivery of medications . Patient is due for refills on medications in May 2022.      Georgiana Shore ,[place] Pharmacist Assistant [phone]

## 2020-12-10 ENCOUNTER — Ambulatory Visit (INDEPENDENT_AMBULATORY_CARE_PROVIDER_SITE_OTHER): Payer: PPO

## 2020-12-10 DIAGNOSIS — Z0000 Encounter for general adult medical examination without abnormal findings: Secondary | ICD-10-CM

## 2020-12-10 NOTE — Progress Notes (Signed)
I connected with Jessica Sherman today by telephone and verified that I am speaking with the correct person using two identifiers. Location patient: home Location provider: work Persons participating in the virtual visit: Leonila Speranza and [employer], LPN.   I discussed the limitations, risks, security and privacy concerns of performing an evaluation and management service by telephone and the availability of in person appointments. I also discussed with the patient that there may be a patient responsible charge related to this service. The patient expressed understanding and verbally consented to this telephonic visit.    Interactive audio and video telecommunications were attempted between this provider and patient, however failed, due to patient having technical difficulties OR patient did not have access to video capability.  We continued and completed visit with audio only.  Some vital signs may be absent or patient reported.   Time Spent with patient on telephone encounter: 30 minutes  Subjective:   Jessica Sherman is a 87 y.o. female who presents for Medicare Annual (Subsequent) preventive examination.  Review of Systems    No ROS. Medicare Wellness Visit. Additional risk factors are reflected in social history. Cardiac Risk Factors include: advanced age (>63men, >15 women);dyslipidemia;hypertension     Objective:    There were no vitals filed for this visit. There is no height or weight on file to calculate BMI.  Advanced Directives 12/10/2020 10/29/2020 03/26/2020 04/17/2019 04/01/2019 03/28/2019 03/28/2019  Does Patient Have a Medical Advance Directive? Yes Yes Yes - Yes No No  Type of Advance Directive Living will;Healthcare Power of [place];Living will [place];Living will [place];Living will Living will;Healthcare Power of Attorney - -  Does patient want to make changes to medical advance  directive? No - Patient declined No - Patient declined No - Patient declined - - - -  Copy of [place] in Chart? No - copy requested Yes - validated most recent copy scanned in chart (See row information) Yes - validated most recent copy scanned in chart (See row information) Yes - validated most recent copy scanned in chart (See row information) No - copy requested - -  Would patient like information on creating a medical advance directive? - - No - Patient declined No - Patient declined No - Patient declined - No - Patient declined  Pre-existing out of facility DNR order (yellow form or pink MOST form) - - - - - - -    Current Medications (verified) Outpatient Encounter Medications as of 12/10/2020  Medication Sig  . amLODipine (NORVASC) 5 MG tablet TAKE 1 TABLET(5 MG) BY MOUTH DAILY  . Calcium Carb-Cholecalciferol (CALCIUM 600+D3 PO) Take 1 tablet by mouth 2 (two) times daily.  . Cholecalciferol (VITAMIN D3) 1000 UNITS CAPS Take 1,000 Units by mouth daily.  Marland Kitchen levothyroxine (SYNTHROID) 75 MCG tablet TAKE ONE TABLET BY MOUTH ONCE DAILY  . Multiple Vitamins-Minerals (PRESERVISION AREDS PO) Take by mouth in the morning and at bedtime.  . simvastatin (ZOCOR) 20 MG tablet TAKE ONE TABLET BY MOUTH EVERYDAY AT BEDTIME  . sodium fluoride (PREVIDENT 5000 PLUS) 1.1 % CREA dental cream Place gel into fluoride tray at bedtime.  Place over teeth for 5 minutes.  Remove.  Spit out excess.  Repeat nightly.  . [DISCONTINUED] prochlorperazine (COMPAZINE) 10 MG tablet Take 1 tablet (10 mg total) by mouth every 6 (six) hours as needed (Nausea or vomiting). (Patient not taking: Reported on 07/27/2016)   No facility-administered encounter  medications on file as of 12/10/2020.    Allergies (verified) Other   History: Past Medical History:  Diagnosis Date  . Hx of colonic polyps   . Hyperlipidemia   . Hypothyroidism   . Macular degeneration, dry   . Osteopenia    spine BMD T score - 1.69   . PONV (postoperative nausea and vomiting)    pt has vertigo and can not lie flat  . Prediabetes    FBS 136  on 08/29/2010; A1c 6.2%  . Vertigo    Past Surgical History:  Procedure Laterality Date  . CATARACT EXTRACTION  2009   OS  . COLONOSCOPY W/ POLYPECTOMY     X 2; last 2012. Dr Watt Climes  . IR GASTROSTOMY TUBE MOD SED  04/17/2019  . IR GASTROSTOMY TUBE REMOVAL  08/29/2019  . IR GENERIC HISTORICAL  05/09/2016   IR US GUIDE VASC ACCESS RIGHT 05/09/2016 Jacqulynn Cadet, MD [facility]  . IR GENERIC HISTORICAL  05/09/2016   IR FLUORO GUIDE CV LINE RIGHT 05/09/2016 Jacqulynn Cadet, MD [facility]  . MODIFIED MASTECTOMY Left 10/18/2016   Procedure: LEFT MODIFIED RADICAL MASTECTOMY;  Surgeon: [name], MD;  Location: [facility];  Service: General;  Laterality: Left;  . PILONIDAL CYST EXCISION     X 3  . TONSILLECTOMY AND ADENOIDECTOMY    . TOTAL ABDOMINAL HYSTERECTOMY     w/ bladder repair   Family History  Problem Relation Age of Onset  . Osteoporosis Mother   . Leukemia Mother   . Cancer Maternal Uncle        ? stomach  . Aneurysm Father        AAA  . Heart attack Neg Hx    Social History   Socioeconomic History  . Marital status: Widowed    Spouse name: Not on file  . Number of children: 4  . Years of education: Not on file  . Highest education level: Not on file  Occupational History  . Occupation: retired  Tobacco Use  . Smoking status: Never Smoker  . Smokeless tobacco: Never Used  Vaping Use  . Vaping Use: Never used  Substance and Sexual Activity  . Alcohol use: No    Comment:    . Drug use: No  . Sexual activity: Not on file  Other Topics Concern  . Not on file  Social History Narrative   Regular exercise- yes 81mpd w/o symptoms   Social Determinants of Health   Financial Resource Strain: Low Risk   . Difficulty of Paying Living Expenses: Not hard at all  Food Insecurity: No Food Insecurity  . Worried About Charity fundraiser  in the Last Year: Never true  . Ran Out of Food in the Last Year: Never true  Transportation Needs: No Transportation Needs  . Lack of Transportation (Medical): No  . Lack of Transportation (Non-Medical): No  Physical Activity: Sufficiently Active  . Days of Exercise per Week: 5 days  . Minutes of Exercise per Session: 30 min  Stress: No Stress Concern Present  . Feeling of Stress : Not at all  Social Connections: Moderately Integrated  . Frequency of Communication with Friends and Family: More than three times a week  . Frequency of Social Gatherings with Friends and Family: More than three times a week  . Attends Religious Services: More than 4 times per year  . Active Member of Clubs or Organizations: Yes  . Attends Archivist Meetings: More than 4  times per year  . Marital Status: Widowed    Tobacco Counseling Counseling given: Not Answered   Clinical Intake:  Pre-visit preparation completed: Yes  Pain : No/denies pain     Nutritional Risks: None Diabetes: No  How often do you need to have someone help you when you read instructions, pamphlets, or other written materials from your doctor or pharmacy?: 1 - Never What is the last grade level you completed in school?: [employer])  Diabetic? no  Interpreter Needed?: No  Information entered by :: Lisette Abu, LPN   Activities of Daily Living In your present state of health, do you have any difficulty performing the following activities: 12/10/2020  Hearing? N  Vision? N  Difficulty concentrating or making decisions? N  Walking or climbing stairs? N  Dressing or bathing? N  Doing errands, shopping? N  Preparing Food and eating ? N  Using the Toilet? N  In the past six months, have you accidently leaked urine? N  Do you have problems with loss of bowel control? N  Managing your Medications? N  Managing your Finances? N  Housekeeping or managing your Housekeeping? N  Some  recent data might be hidden    Patient Care Team: [name], MD as PCP - General (Internal Medicine) [name], Charlestine Massed, NP as Nurse Practitioner (Hematology and Oncology) [name], MD as Consulting Physician (Hematology and Oncology) Bensimhon, Shaune Pascal, MD as Consulting Physician (Cardiology) [name], MD as Consulting Physician (General Surgery) [name], MD as Consulting Physician (Otolaryngology) [name], MD as Attending Physician (Radiation Oncology) [name], RN (Inactive) as Oncology Nurse Navigator Sharen Counter, [place] as Speech Language Pathologist (Speech Pathology) [name], RD as Dietitian (Nutrition) [name], [facility] as Pharmacist (Pharmacist)  Indicate any recent Medical Services you may have received from other than [facility] providers in the past year (date may be approximate).     Assessment:   This is a routine wellness examination for Jessica Sherman.  Hearing/Vision screen No exam data present  Dietary issues and exercise activities discussed: Current Exercise Habits: Home exercise routine, Type of exercise: walking;Other - see comments (stair climbing; swimming exercises), Time (Minutes): 30, Frequency (Times/Week): 5, Weekly Exercise (Minutes/Week): 150, Intensity: Moderate, Exercise limited by: None identified  Goals    . Phamacy Care Plan     CARE PLAN ENTRY (see longitudinal plan of care for additional care plan information)  Current Barriers:  . Chronic Disease Management support, education, and care coordination needs related to Hypertension, Hyperlipidemia, and Supplements   Hypertension BP Readings from Last 3 Encounters:  11/26/19 (!) 158/62  11/03/19 (!) 161/53  07/08/19 (!) 152/58 .  Pharmacist Clinical Goal(s): o Over the next 90 days, patient will work with PharmD and providers to achieve BP goal <150/90 . Current regimen:  o Amlodipine 5 mg daily . Interventions: o Discussed BP goal  and benefits of checking BP at home . Patient self care activities - Over the next 30 days, patient will: o Check BP 3 times a week, document, and provide at future appointments o Ensure daily salt intake < 2300 mg/day  Hyperlipidemia Lab Results  Component Value Date/Time   LDLCALC 66 11/26/2019 10:48 AM   LDLCALC 184 (H) 09/15/2014 10:18 AM   LDLDIRECT 57.0 01/27/2019 02:24 PM .  Pharmacist Clinical Goal(s): o Over the next 90 days, patient will work with PharmD and providers to maintain LDL goal < 100 . Current regimen:  o Simvastatin  20 mg at bedtime . Interventions: o Discussed benefits of simvastatin for prevention of heart attack / stroke . Patient self care activities - Over the next 90 days, patient will: o Continue medications as prescribed o Continue low cholesterol diet  Supplements . Pharmacist Clinical Goal(s) o Over the next 90 days, patient will work with PharmD and providers to optimize therapy . Current regimen:   Multivitamin gummy,   Calcium-Vitamin D  Preservision . Interventions: o Recommended calcium gummies and Preservision chewable tablets for easier swallowing . Patient self care activities - Over the next 30 days, patient will: o Try gummies and chewable forms of vitamins/supplements for easier swallowing  Medication management . Pharmacist Clinical Goal(s): o Over the next 90 days, patient will work with PharmD and providers to maintain optimal medication adherence . Current pharmacy: [facility] . Interventions o Comprehensive medication review performed. o Utilize [facility] pharmacy for medication synchronization, packaging and delivery . Patient self care activities - Over the next 90 days, patient will: o Focus on medication adherence by fill date o Take medications as prescribed o Report any questions or concerns to PharmD and/or provider(s)  Please see past updates related to this goal by clicking on the "Past Updates" button in the  selected goal       Depression Screen PHQ 2/9 Scores 12/10/2020 01/27/2019 12/13/2016 09/15/2014  PHQ - 2 Score 0 0 0 0    Fall Risk Fall Risk  12/10/2020 01/27/2019 12/13/2016 09/15/2014  Falls in the past year? 0 0 No No  Number falls in past yr: 0 0 - -  Injury with Fall? 0 - - -  Risk for fall due to : No Fall Risks - - -  Follow up Falls evaluation completed - - -    FALL RISK PREVENTION PERTAINING TO THE HOME:  Any stairs in or around the home? Yes  If so, are there any without handrails? No  Home free of loose throw rugs in walkways, pet beds, electrical cords, etc? Yes  Adequate lighting in your home to reduce risk of falls? Yes   ASSISTIVE DEVICES UTILIZED TO PREVENT FALLS:  Life alert? No  Use of a cane, walker or w/c? No  Grab bars in the bathroom? Yes  Shower chair or bench in shower? Yes  Elevated toilet Sherman or a handicapped toilet? Yes   TIMED UP AND GO:  Was the test performed? No .  Length of time to ambulate 10 feet: 0 sec.   Gait steady and fast without use of assistive device  Cognitive Function: No flowsheet data found.         Immunizations Immunization History  Administered Date(s) Administered  . Fluad Quad(high Dose 65+) 05/23/2019, 05/27/2020  . Influenza Split 06/21/2011  . Influenza Whole 09/12/1999, 07/06/2005, 06/18/2008, 06/22/2010  . Influenza, High Dose Seasonal PF 07/08/2013, 06/09/2017, 06/01/2018  . Influenza,inj,Quad PF,6+ Mos 05/23/2016  . Influenza-Unspecified 11/20/2014, 06/12/2015, 06/09/2017, 06/01/2018  . PFIZER(Purple Top)SARS-COV-2 Vaccination 10/16/2019, 11/10/2019, 06/09/2020  . Pneumococcal Conjugate-13 10/08/2015  . Pneumococcal Polysaccharide-23 07/17/2002, 07/31/2013  . Td 01/07/2010  . Zoster Recombinat (Shingrix) 05/02/2018, 07/01/2018    TDAP status: Due, Education has been provided regarding the importance of this vaccine. Advised may receive this vaccine at local pharmacy or Health Dept. Aware to provide a copy of  the vaccination record if obtained from local pharmacy or Health Dept. Verbalized acceptance and understanding.  Flu Vaccine status: Up to date  Pneumococcal vaccine status: Up to date  Covid-19 vaccine status: Completed  vaccines  Qualifies for Shingles Vaccine? Yes   Zostavax completed Yes   Shingrix Completed?: Yes  Screening Tests Health Maintenance  Topic Date Due  . TETANUS/TDAP  01/08/2020  . COVID-19 Vaccine (4 - Booster for Pfizer series) 12/07/2020  . DEXA SCAN  05/27/2021 (Originally 04/18/2019)  . INFLUENZA VACCINE  04/11/2021  . PNA vac Low Risk Adult  Completed  . HPV VACCINES  Aged Out    Health Maintenance  Health Maintenance Due  Topic Date Due  . TETANUS/TDAP  01/08/2020  . COVID-19 Vaccine (4 - Booster for Pfizer series) 12/07/2020    Colorectal cancer screening: No longer required.   Mammogram status: No longer required due to patient refusal.  Bone Density status: Completed 04/17/2017. Results reflect: Bone density results: OSTEOPENIA. Repeat every 2 years.  Lung Cancer Screening: (Low Dose CT Chest recommended if Age 43-80 years, 30 pack-year currently smoking OR have quit w/in 15years.) does not qualify.   Lung Cancer Screening Referral: no  Additional Screening:  Hepatitis C Screening: does not qualify; Completed no  Vision Screening: Recommended annual ophthalmology exams for early detection of glaucoma and other disorders of the eye. Is the patient up to date with their annual eye exam?  Yes  Who is the provider or what is the name of the office in which the patient attends annual eye exams? Katy Apo, MD. If pt is not established with a provider, would they like to be referred to a provider to establish care? No .   Dental Screening: Recommended annual dental exams for proper oral hygiene  Community Resource Referral / Chronic Care Management: CRR required this visit?  No   CCM required this visit?  No      Plan:     I have  personally reviewed and noted the following in the patient's chart:   . Medical and social history . Use of alcohol, tobacco or illicit drugs  . Current medications and supplements . Functional ability and status . Nutritional status . Physical activity . Advanced directives . List of other physicians . Hospitalizations, surgeries, and ER visits in previous 12 months . Vitals . Screenings to include cognitive, depression, and falls . Referrals and appointments  In addition, I have reviewed and discussed with patient certain preventive protocols, quality metrics, and best practice recommendations. A written personalized care plan for preventive services as well as general preventive health recommendations were provided to patient.     Sheral Flow, LPN   09/16/1094   Nurse Notes:  Patient is cogitatively intact. There were no vitals filed for this visit. There is no height or weight on file to calculate BMI. Patient stated that she has no issues with gait or balance; does not use any assistive devices. Medications reviewed with patient; no opioid use noted.

## 2020-12-10 NOTE — Patient Instructions (Signed)
Jessica Sherman , Thank you for taking time to come for your Medicare Wellness Visit. I appreciate your ongoing commitment to your health goals. Please review the following plan we discussed and let me know if I can assist you in the future.   Screening recommendations/referrals: Colonoscopy: not a candidate for colon cancer screening due to age 87: 10/09/2019 Bone Density: 04/17/2017; results: osteopenia Recommended yearly ophthalmology/optometry visit for glaucoma screening and checkup Recommended yearly dental visit for hygiene and checkup  Vaccinations: Influenza vaccine: 05/27/2020 Pneumococcal vaccine: 07/31/2013, 10/08/2015 Tdap vaccine: 01/07/2010; due every 10 years Shingles vaccine: 05/02/2018, 07/01/2018   Covid-19: 10/16/2019, 11/10/2019, 06/09/2020  Advanced directives: Please bring a copy of your health care power of attorney and living will to the office at your convenience.  Conditions/risks identified: Yes; Reviewed health maintenance screenings with patient today and relevant education, vaccines, and/or referrals were provided. Please continue to do your personal lifestyle choices by: daily care of teeth and gums, regular physical activity (goal should be 5 days a week for 30 minutes), eat a healthy diet, avoid tobacco and drug use, limiting any alcohol intake, taking a low-dose aspirin (if not allergic or have been advised by your provider otherwise) and taking vitamins and minerals as recommended by your provider. Continue doing brain stimulating activities (puzzles, reading, adult coloring books, staying active) to keep memory sharp. Continue to eat heart healthy diet (full of fruits, vegetables, whole grains, lean protein, water--limit salt, fat, and sugar intake) and increase physical activity as tolerated.  Next appointment: Please schedule your next Medicare Wellness Visit with your Nurse Health Advisor in 1 year by calling [phone].   Preventive Care 80 Years and Older,  Female Preventive care refers to lifestyle choices and visits with your health care provider that can promote health and wellness. What does preventive care include?  A yearly physical exam. This is also called an annual well check.  Dental exams once or twice a year.  Routine eye exams. Ask your health care provider how often you should have your eyes checked.  Personal lifestyle choices, including:  Daily care of your teeth and gums.  Regular physical activity.  Eating a healthy diet.  Avoiding tobacco and drug use.  Limiting alcohol use.  Practicing safe sex.  Taking low-dose aspirin every day.  Taking vitamin and mineral supplements as recommended by your health care provider. What happens during an annual well check? The services and screenings done by your health care provider during your annual well check will depend on your age, overall health, lifestyle risk factors, and family history of disease. Counseling  Your health care provider may ask you questions about your:  Alcohol use.  Tobacco use.  Drug use.  Emotional well-being.  Home and relationship well-being.  Sexual activity.  Eating habits.  History of falls.  Memory and ability to understand (cognition).  Work and work Statistician.  Reproductive health. Screening  You may have the following tests or measurements:  Height, weight, and BMI.  Blood pressure.  Lipid and cholesterol levels. These may be checked every 5 years, or more frequently if you are over 39 years old.  Skin check.  Lung cancer screening. You may have this screening every year starting at age 67 if you have a 30-pack-year history of smoking and currently smoke or have quit within the past 15 years.  Fecal occult blood test (FOBT) of the stool. You may have this test every year starting at age 10.  Flexible sigmoidoscopy or colonoscopy. You  may have a sigmoidoscopy every 5 years or a colonoscopy every 10 years  starting at age 37.  Hepatitis C blood test.  Hepatitis B blood test.  Sexually transmitted disease (STD) testing.  Diabetes screening. This is done by checking your blood sugar (glucose) after you have not eaten for a while (fasting). You may have this done every 1-3 years.  Bone density scan. This is done to screen for osteoporosis. You may have this done starting at age 64.  Mammogram. This may be done every 1-2 years. Talk to your health care provider about how often you should have regular mammograms. Talk with your health care provider about your test results, treatment options, and if necessary, the need for more tests. Vaccines  Your health care provider may recommend certain vaccines, such as:  Influenza vaccine. This is recommended every year.  Tetanus, diphtheria, and acellular pertussis (Tdap, Td) vaccine. You may need a Td booster every 10 years.  Zoster vaccine. You may need this after age 35.  Pneumococcal 13-valent conjugate (PCV13) vaccine. One dose is recommended after age 62.  Pneumococcal polysaccharide (PPSV23) vaccine. One dose is recommended after age 35. Talk to your health care provider about which screenings and vaccines you need and how often you need them. This information is not intended to replace advice given to you by your health care provider. Make sure you discuss any questions you have with your health care provider. Document Released: 09/24/2015 Document Revised: 05/17/2016 Document Reviewed: 06/29/2015 Elsevier Interactive Patient Education  2017 [employer] Prevention in the Home Falls can cause injuries. They can happen to people of all ages. There are many things you can do to make your home safe and to help prevent falls. What can I do on the outside of my home?  Regularly fix the edges of walkways and driveways and fix any cracks.  Remove anything that might make you trip as you walk through a door, such as a raised step or  threshold.  Trim any bushes or trees on the path to your home.  Use bright outdoor lighting.  Clear any walking paths of anything that might make someone trip, such as rocks or tools.  Regularly check to see if handrails are loose or broken. Make sure that both sides of any steps have handrails.  Any raised decks and porches should have guardrails on the edges.  Have any leaves, snow, or ice cleared regularly.  Use sand or salt on walking paths during winter.  Clean up any spills in your garage right away. This includes oil or grease spills. What can I do in the bathroom?  Use night lights.  Install grab bars by the toilet and in the tub and shower. Do not use towel bars as grab bars.  Use non-skid mats or decals in the tub or shower.  If you need to sit down in the shower, use a plastic, non-slip stool.  Keep the floor dry. Clean up any water that spills on the floor as soon as it happens.  Remove soap buildup in the tub or shower regularly.  Attach bath mats securely with double-sided non-slip rug tape.  Do not have throw rugs and other things on the floor that can make you trip. What can I do in the bedroom?  Use night lights.  Make sure that you have a light by your bed that is easy to reach.  Do not use any sheets or blankets that are too big for  your bed. They should not hang down onto the floor.  Have a firm chair that has side arms. You can use this for support while you get dressed.  Do not have throw rugs and other things on the floor that can make you trip. What can I do in the kitchen?  Clean up any spills right away.  Avoid walking on wet floors.  Keep items that you use a lot in easy-to-reach places.  If you need to reach something above you, use a strong step stool that has a grab bar.  Keep electrical cords out of the way.  Do not use floor polish or wax that makes floors slippery. If you must use wax, use non-skid floor wax.  Do not have  throw rugs and other things on the floor that can make you trip. What can I do with my stairs?  Do not leave any items on the stairs.  Make sure that there are handrails on both sides of the stairs and use them. Fix handrails that are broken or loose. Make sure that handrails are as long as the stairways.  Check any carpeting to make sure that it is firmly attached to the stairs. Fix any carpet that is loose or worn.  Avoid having throw rugs at the top or bottom of the stairs. If you do have throw rugs, attach them to the floor with carpet tape.  Make sure that you have a light switch at the top of the stairs and the bottom of the stairs. If you do not have them, ask someone to add them for you. What else can I do to help prevent falls?  Wear shoes that:  Do not have high heels.  Have rubber bottoms.  Are comfortable and fit you well.  Are closed at the toe. Do not wear sandals.  If you use a stepladder:  Make sure that it is fully opened. Do not climb a closed stepladder.  Make sure that both sides of the stepladder are locked into place.  Ask someone to hold it for you, if possible.  Clearly mark and make sure that you can see:  Any grab bars or handrails.  First and last steps.  Where the edge of each step is.  Use tools that help you move around (mobility aids) if they are needed. These include:  Canes.  Walkers.  Scooters.  Crutches.  Turn on the lights when you go into a dark area. Replace any light bulbs as soon as they burn out.  Set up your furniture so you have a clear path. Avoid moving your furniture around.  If any of your floors are uneven, fix them.  If there are any pets around you, be aware of where they are.  Review your medicines with your doctor. Some medicines can make you feel dizzy. This can increase your chance of falling. Ask your doctor what other things that you can do to help prevent falls. This information is not intended to  replace advice given to you by your health care provider. Make sure you discuss any questions you have with your health care provider. Document Released: 06/24/2009 Document Revised: 02/03/2016 Document Reviewed: 10/02/2014 Elsevier Interactive Patient Education  2017 [employer].

## 2020-12-20 NOTE — Progress Notes (Signed)
Subjective:    Patient ID: Jessica Sherman, female    DOB: July 19, 1931, 87 y.o.   MRN: [id number]  HPI The patient is here for follow up of their chronic medical problems, including htn, hyperlipidemia, hypothyroidism, prediabetes, h/o breast ca, h/o tongue cancer  She is eating well.  Her taste is improving.    She gets tired intermittently - it comes and goes.   It is not all that bad and overall she feels well.  She feels she is sleeping well.  She is eating well.  At times she thinks she needs to eat more protein.  Medications and allergies reviewed with patient and updated if appropriate.  Patient Active Problem List   Diagnosis Date Noted  . Dysphonia 06/29/2020  . Aortic atherosclerosis ([place]) 05/27/2020  . Hyponatremia 03/28/2019  . Cancer of base of tongue ([place]) 03/24/2019  . Oropharyngeal dysphagia 03/14/2019  . Dysphasia 01/27/2019  . Hypertension 12/13/2016  . S/P mastectomy, left 10/18/2016  . History of breast cancer 10/18/2016  . Port catheter in place 05/23/2016  . Breast cancer of upper-outer quadrant of left female breast ([place]) 04/30/2016  . Conductive hearing loss in right ear 12/09/2015  . Enteritis 05/30/2013  . Prediabetes   . Macular degeneration, dry   . Vertigo   . Vitamin D deficiency 11/17/2008  . COLONIC POLYPS, HX OF 11/17/2008  . Hypothyroidism 11/19/2007  . HYPERLIPIDEMIA 11/19/2007  . Osteopenia 02/18/2007    Current Outpatient Medications on File Prior to Visit  Medication Sig Dispense Refill  . amLODipine (NORVASC) 5 MG tablet TAKE 1 TABLET(5 MG) BY MOUTH DAILY 90 tablet 3  . Calcium Carb-Cholecalciferol (CALCIUM 600+D3 PO) Take 1 tablet by mouth 2 (two) times daily.    . Cholecalciferol (VITAMIN D3) 1000 UNITS CAPS Take 1,000 Units by mouth daily.    Marland Kitchen levothyroxine (SYNTHROID) 75 MCG tablet TAKE ONE TABLET BY MOUTH ONCE DAILY 90 tablet 1  . Multiple Vitamins-Minerals (PRESERVISION AREDS PO) Take by mouth in the morning and at  bedtime.    . simvastatin (ZOCOR) 20 MG tablet TAKE ONE TABLET BY MOUTH EVERYDAY AT BEDTIME 90 tablet 1  . sodium fluoride (PREVIDENT 5000 PLUS) 1.1 % CREA dental cream Place gel into fluoride tray at bedtime.  Place over teeth for 5 minutes.  Remove.  Spit out excess.  Repeat nightly. 1 Tube prn  . [DISCONTINUED] prochlorperazine (COMPAZINE) 10 MG tablet Take 1 tablet (10 mg total) by mouth every 6 (six) hours as needed (Nausea or vomiting). (Patient not taking: Reported on 07/27/2016) 30 tablet 1   No current facility-administered medications on file prior to visit.    Past Medical History:  Diagnosis Date  . Hx of colonic polyps   . Hyperlipidemia   . Hypothyroidism   . Macular degeneration, dry   . Osteopenia    spine BMD T score - 1.69  . PONV (postoperative nausea and vomiting)    pt has vertigo and can not lie flat  . Prediabetes    FBS 136  on 08/29/2010; A1c 6.2%  . Vertigo     Past Surgical History:  Procedure Laterality Date  . CATARACT EXTRACTION  2009   OS  . COLONOSCOPY W/ POLYPECTOMY     X 2; last 2012. Dr Watt Climes  . IR GASTROSTOMY TUBE MOD SED  04/17/2019  . IR GASTROSTOMY TUBE REMOVAL  08/29/2019  . IR GENERIC HISTORICAL  05/09/2016   IR US GUIDE VASC ACCESS RIGHT 05/09/2016 Jacqulynn Cadet, MD [facility]  . IR GENERIC HISTORICAL  05/09/2016   IR FLUORO GUIDE CV LINE RIGHT 05/09/2016 Jacqulynn Cadet, MD [facility]  . MODIFIED MASTECTOMY Left 10/18/2016   Procedure: LEFT MODIFIED RADICAL MASTECTOMY;  Surgeon: [name], MD;  Location: [facility];  Service: General;  Laterality: Left;  . PILONIDAL CYST EXCISION     X 3  . TONSILLECTOMY AND ADENOIDECTOMY    . TOTAL ABDOMINAL HYSTERECTOMY     w/ bladder repair    Social History   Socioeconomic History  . Marital status: Widowed    Spouse name: Not on file  . Number of children: 4  . Years of education: Not on file  . Highest education level: Not on file  Occupational History  .  Occupation: retired  Tobacco Use  . Smoking status: Never Smoker  . Smokeless tobacco: Never Used  Vaping Use  . Vaping Use: Never used  Substance and Sexual Activity  . Alcohol use: No    Comment:    . Drug use: No  . Sexual activity: Not on file  Other Topics Concern  . Not on file  Social History Narrative   Regular exercise- yes 1mpd w/o symptoms   Social Determinants of Health   Financial Resource Strain: Low Risk   . Difficulty of Paying Living Expenses: Not hard at all  Food Insecurity: No Food Insecurity  . Worried About Charity fundraiser in the Last Year: Never true  . Ran Out of Food in the Last Year: Never true  Transportation Needs: No Transportation Needs  . Lack of Transportation (Medical): No  . Lack of Transportation (Non-Medical): No  Physical Activity: Sufficiently Active  . Days of Exercise per Week: 5 days  . Minutes of Exercise per Session: 30 min  Stress: No Stress Concern Present  . Feeling of Stress : Not at all  Social Connections: Moderately Integrated  . Frequency of Communication with Friends and Family: More than three times a week  . Frequency of Social Gatherings with Friends and Family: More than three times a week  . Attends Religious Services: More than 4 times per year  . Active Member of Clubs or Organizations: Yes  . Attends Archivist Meetings: More than 4 times per year  . Marital Status: Widowed    Family History  Problem Relation Age of Onset  . Osteoporosis Mother   . Leukemia Mother   . Cancer Maternal Uncle        ? stomach  . Aneurysm Father        AAA  . Heart attack Neg Hx     Review of Systems  Constitutional: Negative for appetite change, chills and fever.  Respiratory: Negative for cough, shortness of breath and wheezing.   Cardiovascular: Negative for chest pain, palpitations and leg swelling.  Gastrointestinal: Negative for abdominal pain.       No gerd  Neurological: Negative for light-headedness  and headaches.       Objective:   Vitals:   12/21/20 1116  BP: 126/80  Pulse: (!) 47  Temp: 98.2 F (36.8 C)  SpO2: 98%   BP Readings from Last 3 Encounters:  12/21/20 126/80  10/29/20 (!) 169/67  05/27/20 138/72   Wt Readings from Last 3 Encounters:  12/21/20 102 lb (46.3 kg)  10/29/20 105 lb (47.6 kg)  05/27/20 100 lb 3.2 oz (45.5 kg)   Body mass index is 18.07 kg/m.   Physical Exam    Constitutional:  Appears well-developed and well-nourished. No distress.  HENT:  Head: Normocephalic and atraumatic.  Neck: Neck supple. No tracheal deviation present. No thyromegaly present.  No cervical lymphadenopathy Cardiovascular: Normal rate, regular rhythm and normal heart sounds.   No murmur heard. No carotid bruit .  No edema Pulmonary/Chest: Effort normal and breath sounds normal. No respiratory distress. No has no wheezes. No rales.  Skin: Skin is warm and dry. Not diaphoretic.  Psychiatric: Normal mood and affect. Behavior is normal.      Assessment & Plan:    See Problem List for Assessment and Plan of chronic medical problems.    This visit occurred during the SARS-CoV-2 public health emergency.  Safety protocols were in place, including screening questions prior to the visit, additional usage of staff PPE, and extensive cleaning of exam room while observing appropriate contact time as indicated for disinfecting solutions.

## 2020-12-20 NOTE — Patient Instructions (Addendum)
    Blood work was ordered.      Medications changes include :   none     Please followup in 6 months  

## 2020-12-21 ENCOUNTER — Other Ambulatory Visit: Payer: Self-pay

## 2020-12-21 ENCOUNTER — Encounter: Payer: Self-pay | Admitting: Internal Medicine

## 2020-12-21 ENCOUNTER — Ambulatory Visit (INDEPENDENT_AMBULATORY_CARE_PROVIDER_SITE_OTHER): Payer: PPO | Admitting: Internal Medicine

## 2020-12-21 VITALS — BP 126/80 | HR 47 | Temp 98.2°F | Ht 63.0 in | Wt 102.0 lb

## 2020-12-21 DIAGNOSIS — E782 Mixed hyperlipidemia: Secondary | ICD-10-CM

## 2020-12-21 DIAGNOSIS — R7303 Prediabetes: Secondary | ICD-10-CM

## 2020-12-21 DIAGNOSIS — E039 Hypothyroidism, unspecified: Secondary | ICD-10-CM | POA: Diagnosis not present

## 2020-12-21 DIAGNOSIS — I10 Essential (primary) hypertension: Secondary | ICD-10-CM | POA: Diagnosis not present

## 2020-12-21 LAB — LIPID PANEL
Cholesterol: 126 mg/dL (ref 0–200)
HDL: 64.9 mg/dL (ref >39.00)
LDL Cholesterol: 42 mg/dL (ref 0–99)
NonHDL: 61.51
Total CHOL/HDL Ratio: 2
Triglycerides: 96 mg/dL (ref 0.0–149.0)
VLDL: 19.2 mg/dL (ref 0.0–40.0)

## 2020-12-21 LAB — CBC WITH DIFFERENTIAL/PLATELET
Basophils Absolute: 0.1 10*3/uL (ref 0.0–0.1)
Basophils Relative: 0.8 % (ref 0.0–3.0)
Eosinophils Absolute: 0.1 10*3/uL (ref 0.0–0.7)
Eosinophils Relative: 1.4 % (ref 0.0–5.0)
HCT: 34.9 % — ABNORMAL LOW (ref 36.0–46.0)
Hemoglobin: 11.9 g/dL — ABNORMAL LOW (ref 12.0–15.0)
Lymphocytes Relative: 17 % (ref 12.0–46.0)
Lymphs Abs: 1.1 10*3/uL (ref 0.7–4.0)
MCHC: 34 g/dL (ref 30.0–36.0)
MCV: 99 fl (ref 78.0–100.0)
Monocytes Absolute: 0.8 10*3/uL (ref 0.1–1.0)
Monocytes Relative: 11.5 % (ref 3.0–12.0)
Neutro Abs: 4.7 10*3/uL (ref 1.4–7.7)
Neutrophils Relative %: 69.3 % (ref 43.0–77.0)
Platelets: 227 10*3/uL (ref 150.0–400.0)
RBC: 3.53 Mil/uL — ABNORMAL LOW (ref 3.87–5.11)
RDW: 13 % (ref 11.5–15.5)
WBC: 6.8 10*3/uL (ref 4.0–10.5)

## 2020-12-21 LAB — COMPREHENSIVE METABOLIC PANEL
ALT: 20 U/L (ref 0–35)
AST: 22 U/L (ref 0–37)
Albumin: 4 g/dL (ref 3.5–5.2)
Alkaline Phosphatase: 70 U/L (ref 39–117)
BUN: 16 mg/dL (ref 6–23)
CO2: 29 mEq/L (ref 19–32)
Calcium: 9.4 mg/dL (ref 8.4–10.5)
Chloride: 97 mEq/L (ref 96–112)
Creatinine, Ser: 0.75 mg/dL (ref 0.40–1.20)
GFR: 71.17 mL/min (ref >60.00)
Glucose, Bld: 81 mg/dL (ref 70–99)
Potassium: 3.9 mEq/L (ref 3.5–5.1)
Sodium: 135 mEq/L (ref 135–145)
Total Bilirubin: 0.4 mg/dL (ref 0.2–1.2)
Total Protein: 7.3 g/dL (ref 6.0–8.3)

## 2020-12-21 LAB — TSH: TSH: 1.69 u[IU]/mL (ref 0.35–4.50)

## 2020-12-21 LAB — HEMOGLOBIN A1C: Hgb A1c MFr Bld: 6.1 % (ref 4.6–6.5)

## 2020-12-21 NOTE — Assessment & Plan Note (Signed)
Chronic  Clinically euthyroid Currently taking levothyroxine 75 mcg daily Check tsh  Titrate med dose if needed  

## 2020-12-21 NOTE — Assessment & Plan Note (Signed)
Chronic Check A1c She is eating a healthy diet and is active

## 2020-12-21 NOTE — Assessment & Plan Note (Addendum)
Chronic Continue simvastatin 20 mg daily Check lipid panel, CMP

## 2020-12-21 NOTE — Assessment & Plan Note (Signed)
Chronic BP well controlled Continue amlodipine 5 mg daily cmp  

## 2020-12-30 DIAGNOSIS — Z85810 Personal history of malignant neoplasm of tongue: Secondary | ICD-10-CM | POA: Diagnosis not present

## 2020-12-30 DIAGNOSIS — H6123 Impacted cerumen, bilateral: Secondary | ICD-10-CM | POA: Diagnosis not present

## 2020-12-30 DIAGNOSIS — J343 Hypertrophy of nasal turbinates: Secondary | ICD-10-CM | POA: Diagnosis not present

## 2021-01-18 IMAGING — PT NUCLEAR MEDICINE PET IMAGE INITIAL (PI) SKULL BASE TO THIGH
7 series · 16 of 16 positions shown · non-contrast
Comparison: Neck CT 03/13/2019

CLINICAL DATA: Initial treatment strategy for tongue base neoplasm.

EXAM:
NUCLEAR MEDICINE PET SKULL BASE TO THIGH
TECHNIQUE: 5.68 mCi F-18 FDG was injected intravenously. Full-ring PET imaging
was performed from the skull base to thigh after the radiotracer. CT
data was obtained and used for attenuation correction and anatomic
localization.
Fasting blood glucose: 120 mg/dl

[Series 3: pet hn_sk_thigh ac · axial · 5.0mm · 4.07mm/px · z∈[-1304,-552]mm · 4 of 189 slices shown]
[im 1/189]
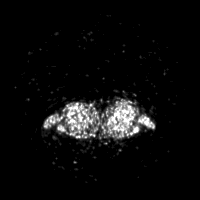
[im 63/189]
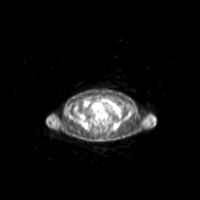
[im 126/189]
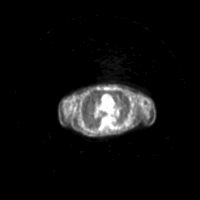
[im 189/189]
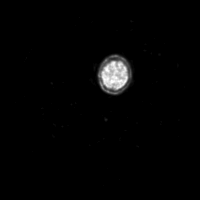

[Series 4: ct hn_sk_th 5.0 b31f · axial · 0.98mm/px · z∈[-1304,-552]mm · 3 of 189 slices shown]
[im 1/189  soft-tissue]
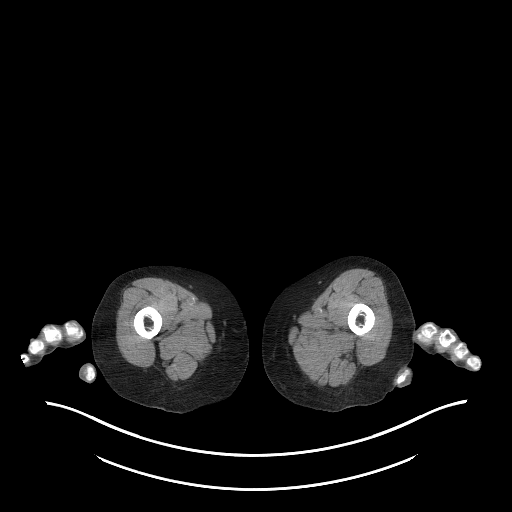
[im 95/189  soft-tissue]
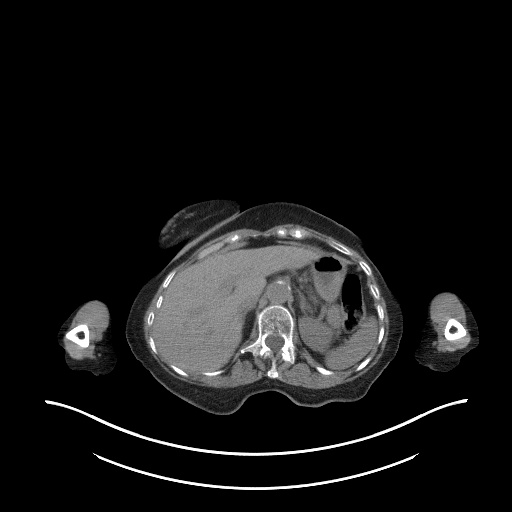
[im 189/189  soft-tissue]
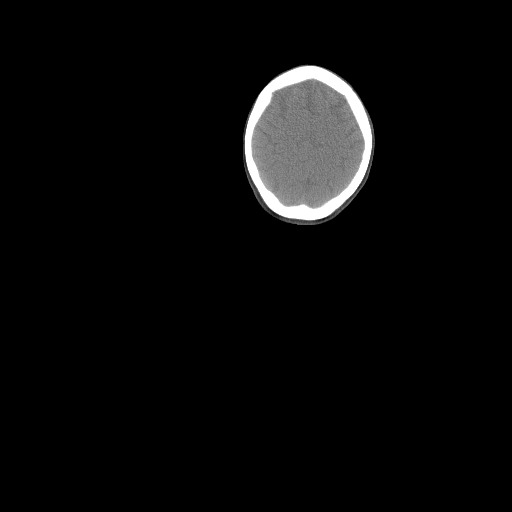

[Series 5: pet hn_sk_thigh nac · axial · 5.0mm · 4.07mm/px · z∈[-1304,-552]mm · 3 of 189 slices shown]
[im 1/189]
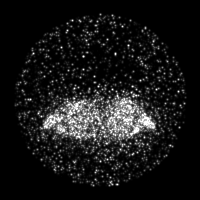
[im 95/189]
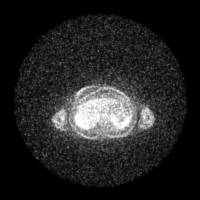
[im 189/189]
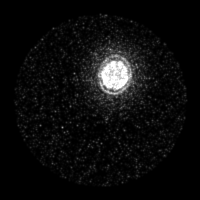

[Series 8: ct hn_sk_th 5.0 b70f (id)_bone · axial · 0.54mm/px · 1 of 59 slices shown]
[im 1/59  soft-tissue]
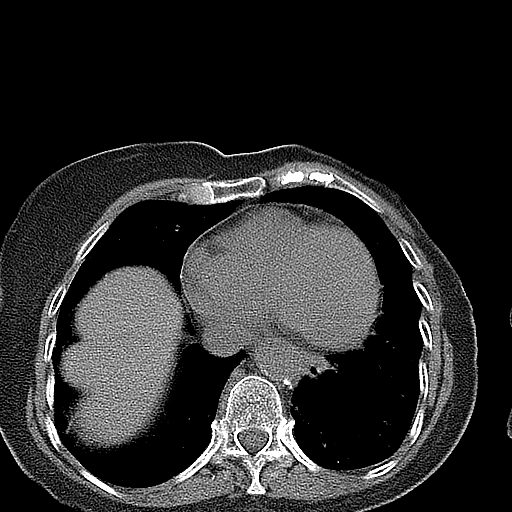

[Series 603: mip range · coronal · 1.68mm/px · 1 of 32 slices shown]
[im 1/32]
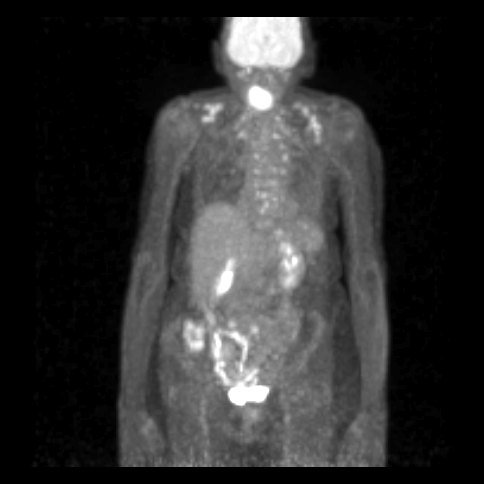

[Series 604: range-ct hn_sk_th 5.0 (id)<alpha range> · 1 of 85 slices shown (1 of 2)]
[im 1/85]
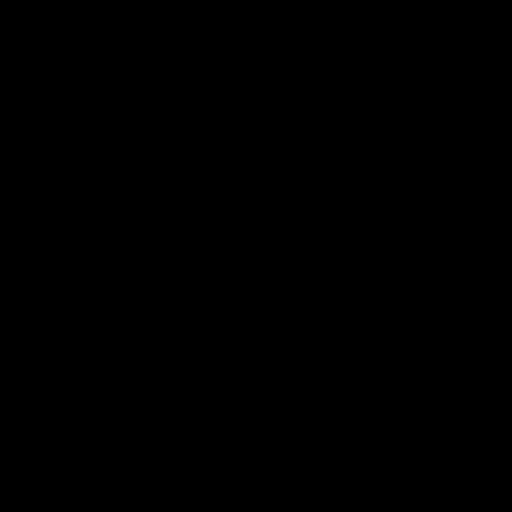

[Series 605: range-ct hn_sk_th 5.0 (id)<alpha range> · 3 of 179 slices shown (2 of 2)]
[im 1/179]
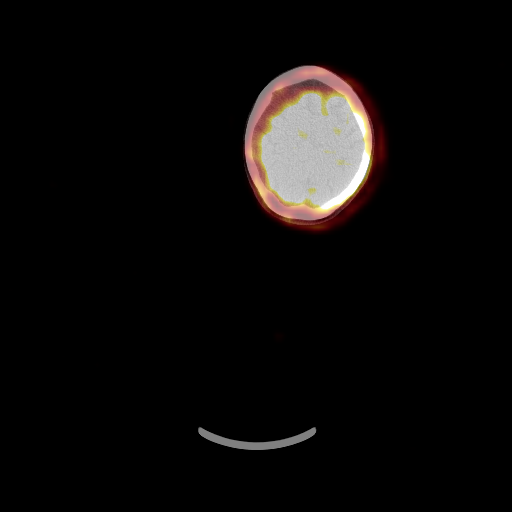
[im 90/179]
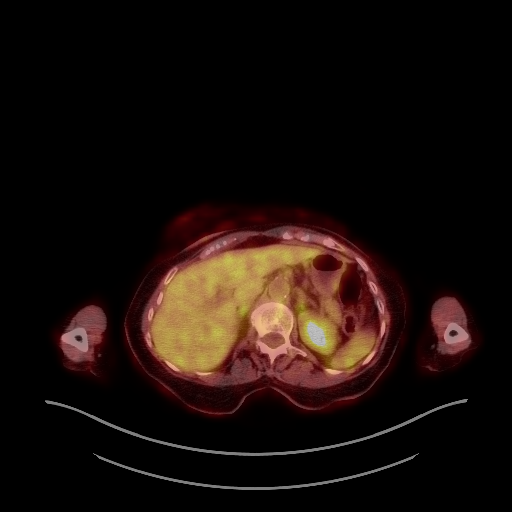
[im 179/179]
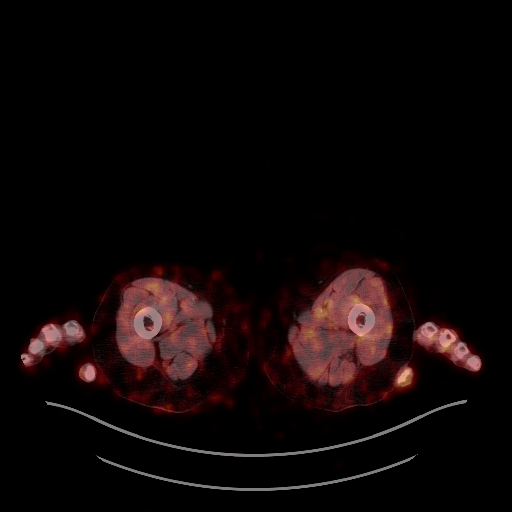

[16 of 16 positions shown; findings below may reference images not displayed]

FINDINGS: Mediastinal blood pool activity: SUV max

Liver activity: SUV max NA

NECK: Large right-sided tongue base mass extending into the right
tonsillar and vallecular regions. This is markedly hypermetabolic
with SUV max of 21.99.

No enlarged or hypermetabolic neck or supraclavicular lymph nodes.

There is fairly extensive hypermetabolic brown fat noted mainly in
the supraclavicular areas.

Incidental CT findings: none

CHEST: Emphysematous changes and pulmonary scarring but no acute
pulmonary findings. 4 mm sub solid nodule in the right middle lobe
is indeterminate. It was not present on the prior chest CT from
3685. It could be inflammatory. Follow-up noncontrast chest CT in
6-12 months is suggested.

Small nodule along the left major fissure was present on 3685 and is
likely a lymph node.

No supraclavicular or axillary hypermetabolic adenopathy. No
mediastinal or hilar adenopathy.

Incidental CT findings: Surgical changes from a left mastectomy.

ABDOMEN/PELVIS: No abnormal hypermetabolic activity within the
liver, pancreas, adrenal glands, or spleen. No hypermetabolic lymph
nodes in the abdomen or pelvis.

Incidental CT findings: none

SKELETON: No findings for metastatic bone disease. Areas of
hypermetabolism in the intercostal muscles in the chest are noted.
This is a benign finding.

Incidental CT findings: none
IMPRESSION: 1. Large right tongue base mass is markedly hypermetabolic and
consistent with neoplasm.
2. No neck adenopathy or evidence of metastatic disease in the
chest, abdomen or pelvis.
3. Single 4 mm nodule in the right middle lobe is indeterminate.
Recommend follow-up noncontrast chest CT in 6-12 months.
4. Surgical changes from a left mastectomy. No chest wall mass or
supraclavicular or axillary adenopathy.

## 2021-01-27 ENCOUNTER — Other Ambulatory Visit: Payer: Self-pay | Admitting: Internal Medicine

## 2021-01-27 ENCOUNTER — Telehealth: Payer: Self-pay | Admitting: Pharmacist

## 2021-01-27 DIAGNOSIS — I10 Essential (primary) hypertension: Secondary | ICD-10-CM

## 2021-01-27 IMAGING — DX PORTABLE CHEST - 1 VIEW
2 series · 2 of 2 positions shown · non-contrast
Comparison: August 29, 2010

CLINICAL DATA: Weakness and hyponatremia. History of throat
carcinoma

EXAM:
PORTABLE CHEST 1 VIEW

[chest ap (1 of 2)]
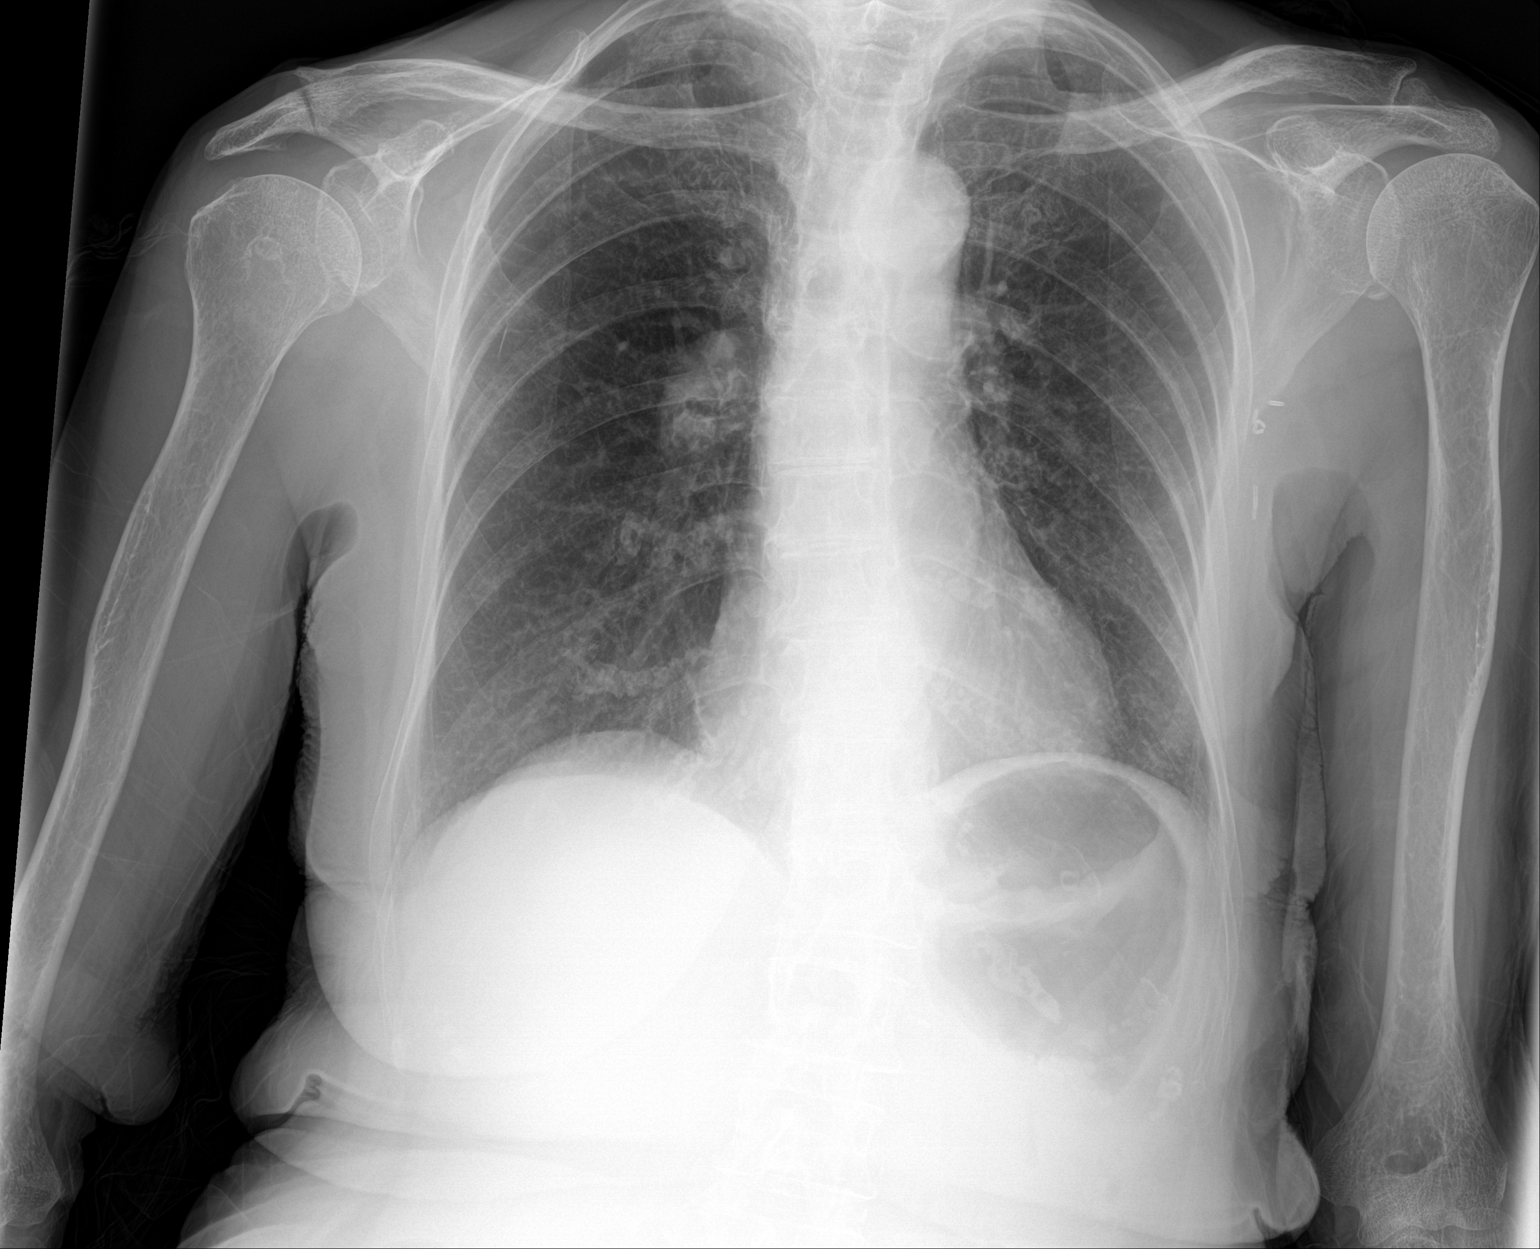

[chest ap (2 of 2)]
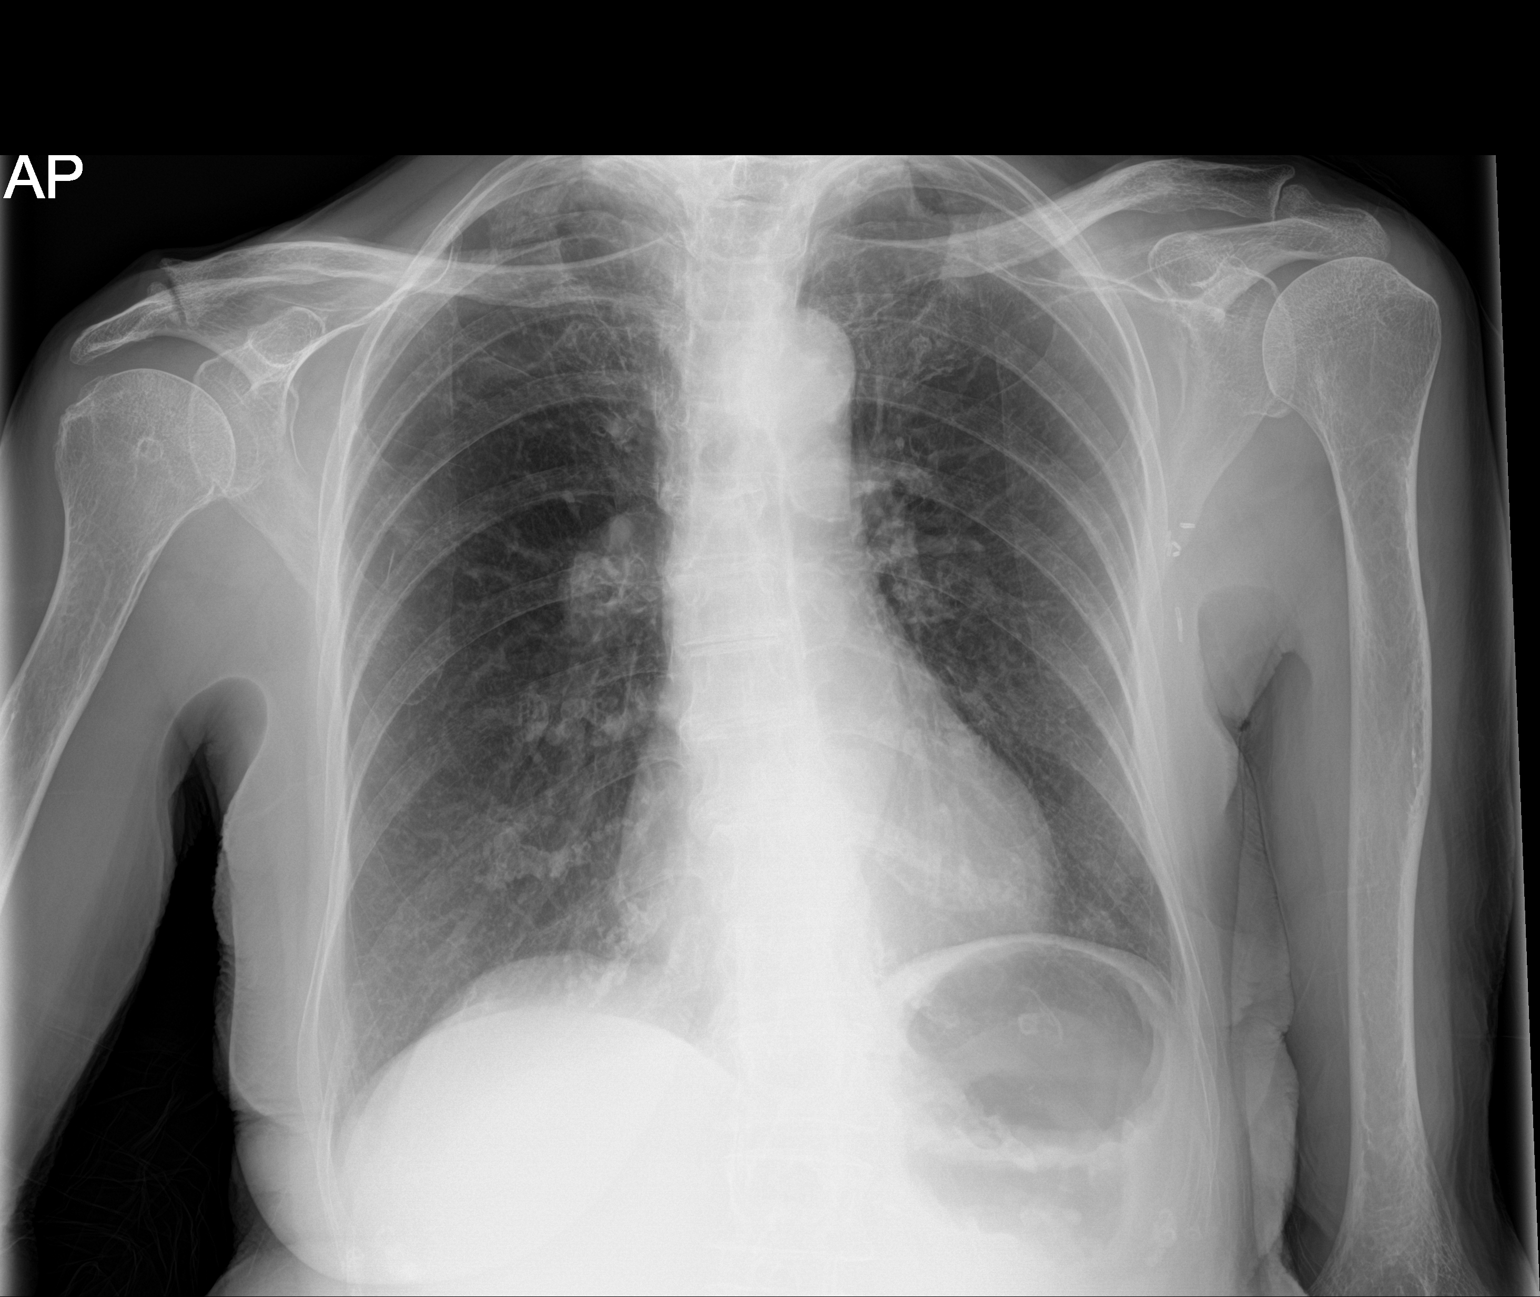

[2 of 2 positions shown; findings below may reference images not displayed]

FINDINGS: Lungs are clear. Heart size and pulmonary vascularity are normal. No
adenopathy. There are surgical clips in left axillary region. Bones
are osteoporotic.
IMPRESSION: No edema or consolidation.  No evident adenopathy.

## 2021-01-31 NOTE — Progress Notes (Signed)
    Chronic Care Management Pharmacy Assistant   Name: RETTA PITCHER  MRN: [id number] DOB: 01/10/1931  Reason for Encounter: Medication Review    Recent office visits:  None ID  Recent consult visits:  None ID  Hospital visits:  None in previous 6 months  Medications: Outpatient Encounter Medications as of 01/27/2021  Medication Sig Note  . amLODipine (NORVASC) 5 MG tablet TAKE ONE TABLET BY MOUTH ONCE DAILY   . Calcium Carb-Cholecalciferol (CALCIUM 600+D3 PO) Take 1 tablet by mouth 2 (two) times daily. 12/02/2019: Patient cannot swallow large pill right now  . Cholecalciferol (VITAMIN D3) 1000 UNITS CAPS Take 1,000 Units by mouth daily.   Marland Kitchen levothyroxine (SYNTHROID) 75 MCG tablet TAKE ONE TABLET BY MOUTH ONCE DAILY   . Multiple Vitamins-Minerals (PRESERVISION AREDS PO) Take by mouth in the morning and at bedtime.   . simvastatin (ZOCOR) 20 MG tablet TAKE ONE TABLET BY MOUTH EVERYDAY AT BEDTIME   . sodium fluoride (PREVIDENT 5000 PLUS) 1.1 % CREA dental cream Place gel into fluoride tray at bedtime.  Place over teeth for 5 minutes.  Remove.  Spit out excess.  Repeat nightly.    No facility-administered encounter medications on file as of 01/27/2021.    Reviewed chart for medication changes ahead of medication coordination call.  No OVs, Consults, or hospital visits since last care coordination call/Pharmacist visit. (If appropriate, list visit date, provider name)  No medication changes indicated OR if recent visit, treatment plan here.  BP Readings from Last 3 Encounters:  12/21/20 126/80  10/29/20 (!) 169/67  05/27/20 138/72    Lab Results  Component Value Date   HGBA1C 6.1 12/21/2020     Patient obtains medications through Vials  90 Days   Last adherence delivery included:  Simvastatin 20 mg Take one tablet everyday at bedtime Amlodipine 5 mg Tab Take one tablet by mouth once daily Levothyroxine 75 mcg Take one tablet by mouth once daily    Patient is due  for next adherence delivery on: 02/07/21. Called patient and reviewed medications and coordinated delivery.  This delivery to include: Simvastatin 20 mg Take one tablet everyday at bedtime Amlodipine 5 mg Tab Take one tablet by mouth once daily Levothyroxine 75 mcg Take one tablet by mouth once daily    Confirmed delivery date of 02/07/21, advised patient that pharmacy will contact them the morning of delivery.   [place] Pharmacist Assistant [phone]  Time spent:30

## 2021-02-01 ENCOUNTER — Telehealth: Payer: Self-pay | Admitting: Adult Health

## 2021-02-01 NOTE — Telephone Encounter (Signed)
R/s per 2/21 los, pt daughter aware, mailed calendar.

## 2021-04-11 ENCOUNTER — Telehealth: Payer: Self-pay | Admitting: Adult Health

## 2021-04-11 NOTE — Telephone Encounter (Signed)
Scheduled appointment per provider. Left message. 

## 2021-04-19 ENCOUNTER — Other Ambulatory Visit: Payer: Self-pay | Admitting: Adult Health

## 2021-04-19 DIAGNOSIS — C01 Malignant neoplasm of base of tongue: Secondary | ICD-10-CM

## 2021-04-23 ENCOUNTER — Other Ambulatory Visit: Payer: Self-pay | Admitting: Internal Medicine

## 2021-04-25 ENCOUNTER — Encounter: Payer: PPO | Admitting: Adult Health

## 2021-04-27 ENCOUNTER — Telehealth: Payer: Self-pay | Admitting: Pharmacist

## 2021-04-27 NOTE — Progress Notes (Signed)
    Chronic Care Management Pharmacy Assistant   Name: Jessica Sherman  MRN: [id number] DOB: 09-14-1930   Reason for Encounter: Medication Coordination Call    Hospital visits:  None in previous 6 months  Medications: Outpatient Encounter Medications as of 04/27/2021  Medication Sig Note   amLODipine (NORVASC) 5 MG tablet TAKE ONE TABLET BY MOUTH ONCE DAILY    Calcium Carb-Cholecalciferol (CALCIUM 600+D3 PO) Take 1 tablet by mouth 2 (two) times daily. 12/02/2019: Patient cannot swallow large pill right now   Cholecalciferol (VITAMIN D3) 1000 UNITS CAPS Take 1,000 Units by mouth daily.    levothyroxine (SYNTHROID) 75 MCG tablet TAKE ONE TABLET BY MOUTH ONCE DAILY    Multiple Vitamins-Minerals (PRESERVISION AREDS PO) Take by mouth in the morning and at bedtime.    simvastatin (ZOCOR) 20 MG tablet TAKE ONE TABLET BY MOUTH EVERYDAY AT BEDTIME    sodium fluoride (PREVIDENT 5000 PLUS) 1.1 % CREA dental cream Place gel into fluoride tray at bedtime.  Place over teeth for 5 minutes.  Remove.  Spit out excess.  Repeat nightly.    [DISCONTINUED] prochlorperazine (COMPAZINE) 10 MG tablet Take 1 tablet (10 mg total) by mouth every 6 (six) hours as needed (Nausea or vomiting). (Patient not taking: Reported on 07/27/2016)    No facility-administered encounter medications on file as of 04/27/2021.      Reviewed chart for medication changes ahead of medication coordination call.  No OVs, Consults, or hospital visits since last care coordination call/Pharmacist visit. (If appropriate, list visit date, provider name)  No medication changes indicated OR if recent visit, treatment plan here.  BP Readings from Last 3 Encounters:  12/21/20 126/80  10/29/20 (!) 169/67  05/27/20 138/72    Lab Results  Component Value Date   HGBA1C 6.1 12/21/2020     Patient obtains medications through Vials  90 Days   Last adherence delivery included: Last fill 02/02/21 Simvastatin 20 mg Take one tablet  everyday at bedtime Amlodipine 5 mg Tab Take one tablet by mouth once daily Levothyroxine 75 mcg Take one tablet by mouth once daily   Patient is due for next adherence delivery on: 05/06/21.  Called patient and reviewed medications and coordinated delivery. This delivery to include:  Simvastatin 20 mg Take one tablet everyday at bedtime Amlodipine 5 mg Tab Take one tablet by mouth once daily Levothyroxine 75 mcg Take one tablet by mouth once daily   Patient needs refills for simvastatin sent on 04/25/21 by Dr. Quay Burow.  Confirmed delivery date of 05/06/21, advised patient that pharmacy will contact them the morning of delivery.   [place] Pharmacist Assistant [phone]   Time spent: 44

## 2021-05-02 ENCOUNTER — Encounter: Payer: PPO | Admitting: Adult Health

## 2021-05-02 ENCOUNTER — Encounter: Payer: PPO | Admitting: Medical

## 2021-05-23 ENCOUNTER — Inpatient Hospital Stay: Payer: PPO | Attending: Medical | Admitting: Adult Health

## 2021-05-23 ENCOUNTER — Other Ambulatory Visit: Payer: Self-pay

## 2021-05-23 VITALS — BP 164/57 | HR 62 | Temp 97.9°F | Resp 18 | Ht 63.0 in | Wt 102.0 lb

## 2021-05-23 DIAGNOSIS — Z853 Personal history of malignant neoplasm of breast: Secondary | ICD-10-CM | POA: Insufficient documentation

## 2021-05-23 DIAGNOSIS — Z923 Personal history of irradiation: Secondary | ICD-10-CM | POA: Insufficient documentation

## 2021-05-23 DIAGNOSIS — C01 Malignant neoplasm of base of tongue: Secondary | ICD-10-CM | POA: Diagnosis not present

## 2021-05-23 DIAGNOSIS — Z9012 Acquired absence of left breast and nipple: Secondary | ICD-10-CM | POA: Insufficient documentation

## 2021-05-23 DIAGNOSIS — Z79899 Other long term (current) drug therapy: Secondary | ICD-10-CM | POA: Insufficient documentation

## 2021-05-23 DIAGNOSIS — Z85810 Personal history of malignant neoplasm of tongue: Secondary | ICD-10-CM | POA: Diagnosis not present

## 2021-05-23 NOTE — Progress Notes (Signed)
CLINIC:  Survivorship  REASON FOR VISIT:  Routine follow-up for history of head & neck cancer.  BRIEF ONCOLOGIC HISTORY:  Oncology History  Breast cancer of upper-outer quadrant of left female breast ([place])  04/12/2016 Initial Diagnosis   Left breast biopsy: IDC grade 2-3, left axillary LN positive for metastatic IDC; mammogram revealed a 5 cm solid mass left breast 12:00 anterior depth, ER 0%, PR 0%, Ki-67 80%, HER-2 positive ratio 7.6, copy #16.5   05/10/2016 Imaging   Bone scan normal; CT CAP: left breast cancer, left axillary and subpectoral nodes are not pathologic by size criteriabut suspicious, no metastatic disease   05/16/2016 Breast MRI   Left breast solid with cystic appearance OUQ 1:00 crossing into the medial left breast 4.7 x 3.3 x 4.2 cm; second 63m nodule left breast UOQ: Posterior intramammary/low axillary lymph node, indeterminate left axillary lymph nodes, skin thickening   05/17/2016 - 08/29/2016 Neo-Adjuvant Chemotherapy   Taxol weekly 12 Herceptin and Perjeta every 3 weeks 6   09/05/2016 Breast MRI   Breast MRI: Complete Imaging response   10/18/2016 Surgery   Left modified radical mastectomy: Complete response to treatment 0/9 lymph nodes negative [id number]    03/12/2019 Relapse/Recurrence   Patient had dysphagia and pain in neck. CT neck showed a large mass at the base of the tongue compatible with carcinoma and a 9 mm right level 2 lymph node which shows increased enhancement and is suspicious for metastatic disease. PET scan showed the large right tongue base mass consistent with neoplasm, a 472mpulmonary nodule that was indeterminate. No evidence of metastases in the chest, abdomen or pelvis.   [place] of [place] 03/13/2019 Imaging   CT neck w/ contrast: IMPRESSION: Large mass lesion base of tongue on the right compatible with carcinoma.   9 mm right level 2 lymph node which shows increased enhancement and is suspicious for metastatic disease.  Correlate with PET-CT.   03/19/2019 Imaging   PET: IMPRESSION: 1. Large right tongue base mass is markedly hypermetabolic and consistent with neoplasm. 2. No neck adenopathy or evidence of metastatic disease in the chest, abdomen or pelvis. 3. Single 4 mm nodule in the right middle lobe is indeterminate. Recommend follow-up noncontrast chest CT in 6-12 months. 4. Surgical changes from a left mastectomy. No chest wall mass or supraclavicular or axillary adenopathy.   03/21/2019 Cancer Staging   Staging form: Pharynx - HPV-Mediated Oropharynx, AJCC 8th Edition - Clinical stage from 03/21/2019: Stage I (cT2, cN1, cM0, p16+) - Signed by SqEppie GibsonMD on 06/18/2019   03/24/2019 Initial Diagnosis   [place]cer of [place] of tongue ([facility]  04/14/2019 - 06/02/2019 Radiation Therapy   04/14/2019 through 06/02/2019 (SIsidore Moos  Site Technique Total Dose Dose per Fx Completed Fx Beam Energies  Head & neck: HN_BOT IMRT 70/70 2 35/35 6X          INTERVAL HISTORY:  GuEmmajos doing well today.  She says she has no issues today.  She is up to date with her cancer screenings and she isn't sure she wants to continue having mammograms.  She says she plans on thinking about this  -Pain: none -Nutrition/Diet: regular, soft,   -Dysphagia?: very mild, avoids steak -Dental issues?: no -Last TSH: 12/21/2020 (checked annually with PCP) -Weight: stable at 102 lbs  -Last ENT visit:  12/2020--sees annually -Last Rad Onc visit: 10/2021 -Last Dentist visit: 05/2021--sees every 6 months    ADDITIONAL REVIEW OF SYSTEMS:  Review of  Systems  Constitutional:  Negative for appetite change, chills, fatigue, fever and unexpected weight change.  HENT:   Negative for hearing loss, lump/mass and trouble swallowing.   Eyes:  Negative for eye problems and icterus.  Respiratory:  Negative for chest tightness, cough and shortness of breath.   Cardiovascular:  Negative for chest pain, leg swelling and palpitations.   Gastrointestinal:  Negative for abdominal distention, abdominal pain, constipation, diarrhea, nausea and vomiting.  Endocrine: Negative for hot flashes.  Genitourinary:  Negative for difficulty urinating.   Musculoskeletal:  Negative for arthralgias.  Skin:  Negative for itching and rash.  Neurological:  Negative for dizziness, extremity weakness, headaches and numbness.  Hematological:  Negative for adenopathy. Does not bruise/bleed easily.  Psychiatric/Behavioral:  Negative for depression. The patient is not nervous/anxious.       CURRENT MEDICATIONS:  Current Outpatient Medications on File Prior to Visit  Medication Sig Dispense Refill   amLODipine (NORVASC) 5 MG tablet TAKE ONE TABLET BY MOUTH ONCE DAILY 90 tablet 3   Calcium Carb-Cholecalciferol (CALCIUM 600+D3 PO) Take 1 tablet by mouth 2 (two) times daily.     Cholecalciferol (VITAMIN D3) 1000 UNITS CAPS Take 1,000 Units by mouth daily.     levothyroxine (SYNTHROID) 75 MCG tablet TAKE ONE TABLET BY MOUTH ONCE DAILY 90 tablet 1   Multiple Vitamins-Minerals (PRESERVISION AREDS PO) Take by mouth in the morning and at bedtime.     simvastatin (ZOCOR) 20 MG tablet TAKE ONE TABLET BY MOUTH EVERYDAY AT BEDTIME 90 tablet 1   sodium fluoride (PREVIDENT 5000 PLUS) 1.1 % CREA dental cream Place gel into fluoride tray at bedtime.  Place over teeth for 5 minutes.  Remove.  Spit out excess.  Repeat nightly. (Patient not taking: Reported on 05/23/2021) 1 Tube prn   [DISCONTINUED] prochlorperazine (COMPAZINE) 10 MG tablet Take 1 tablet (10 mg total) by mouth every 6 (six) hours as needed (Nausea or vomiting). (Patient not taking: Reported on 07/27/2016) 30 tablet 1   No current facility-administered medications on file prior to visit.    ALLERGIES:  Allergies  Allergen Reactions   Other Nausea And Vomiting    Anesthesia "makes me very sick", Please pre-medicate to control nausea and vomiting     PHYSICAL EXAM:  Vitals:   05/23/21 1033   BP: (!) 164/57  Pulse: 62  Resp: 18  Temp: 97.9 F (36.6 C)  SpO2: 100%   Filed Weights   05/23/21 1033  Weight: 102 lb (46.3 kg)     General: Well-nourished, well-appearing female in no acute distress.  Accompanied/Unaccompanied today.  HEENT: Head is atraumatic and normocephalic.  Pupils equal and reactive to light. Conjunctivae clear without exudate.  Sclerae anicteric. Oral mucosa is pink and moist without lesions.  Tongue pink, moist, and midline. Oropharynx is pink and moist, without lesions. Lymph: No preauricular, postauricular, cervical, supraclavicular, or infraclavicular lymphadenopathy noted on palpation.   Breast: left breast s/p mastectomy, no sign of local recurrence, right breast benign Neck: No palpable masses. Skin on neck is without swelling, or scarring noted.  Cardiovascular: Normal rate and rhythm. Respiratory: Clear to auscultation bilaterally. Chest expansion symmetric without accessory muscle use; breathing non-labored.  GI: Abdomen soft and round. Non-tender, non-distended. Bowel sounds normoactive.  GU: Deferred.   Neuro: No focal deficits. Steady gait.   Psych: Normal mood and affect for situation. Extremities: No edema.  Skin: Warm and dry.    LABORATORY DATA:  None at this visit.  DIAGNOSTIC IMAGING:  None at this  visit.    ASSESSMENT & PLAN:  Ms. Kesling is a pleasant 87 y.o. female with history of breast cancer from 2017 and base of tongue cancer, diagnosed in 03/2019;  treated with radiation therapy; completed treatment on 06/02/2019.  Patient presents to survivorship clinic today for routine follow-up after finishing treatment.   1. Cancer at the base of the tongue:  Ms. Merkin is clinically without evidence of disease or recurrence on physical exam today.     2. Nutritional status: Ms. Elsayed reports that she is currently able to consume adequate nutrition by mouth.  Weight is stable at 102 lbs today.  Encouraged to continue to consume  adequate hydration and nutrition, as tolerated.    3. At risk for dysphagia: Given Ms. Knipfer's treatment for base of tongue cancer, which included radiation therapy, she is at risk for chronic dysphagia.  she reports having difficulty with breads and meats, but is able to consume soft foods and liquids without difficulty.  Currently, the patient's reported swallowing concerns are to be expected and stable.  If needed we can always re refer to SLP.  4.  At risk for hypothyroidism:  She will continuye to follow with her PCP for TSH which she has completed every 6 months.    5. At risk for tooth decay/dental concerns: No concerns today.  I recommended she continue with her regular dental f/u  6. Health maintenance and wellness promotion: Cancer patients who consume a diet rich in fruits and vegetables have better overall health and decreased risk of cancer recurrence. Ms. Kerper was encouraged to consume 5-7 servings of fruits and vegetables per day, as tolerated. Ms. Dicenso was also encouraged to engage in moderate to vigorous exercise for 30 minutes per day most days of the week.   7. Support services/counseling: It is not uncommon for this period of the patient's cancer care trajectory to be one of many emotions and stressors.  We discussed an opportunity for her to participate in the next session of Head & Neck FYNN ("Finding Your New Normal") support group series, designed for patients after they have completed treatment.   Ms. Tomkinson was encouraged to take advantage of our many other support services programs, support groups, and/or counseling in coping with her new life as a cancer survivor after completing anti-cancer treatment. The patient was offered support today through active listening and expressive supportive counseling.     Dispo:  -Dr. Isidore Moos in 10/2021 _return to see me in 04/2022 -to see Dr. Redmond Baseman as recommended by his office  Total encounter time: 30 minutes*  Wilber Bihari, NP 05/23/21 10:43 AM Medical Oncology and Hematology [facility] [place], [place] [address] Tel. [phone]    Fax. [phone]  *Total Encounter Time as defined by the Centers for Medicare and Medicaid Services includes, in addition to the face-to-face time of a patient visit (documented in the note above) non-face-to-face time: obtaining and reviewing outside history, ordering and reviewing medications, tests or procedures, care coordination (communications with other health care professionals or caregivers) and documentation in the medical record.

## 2021-05-25 ENCOUNTER — Encounter: Payer: Self-pay | Admitting: Hematology and Oncology

## 2021-06-21 NOTE — Patient Instructions (Addendum)
    Flu immunization administered today.     Blood work was ordered.     Medications changes include :   none     Please followup in 6 months  

## 2021-06-21 NOTE — Progress Notes (Signed)
Subjective:    Patient ID: Jessica Sherman, female    DOB: [birth year], 87 y.o.   MRN: [id number]  This visit occurred during the SARS-CoV-2 public health emergency.  Safety protocols were in place, including screening questions prior to the visit, additional usage of staff PPE, and extensive cleaning of exam room while observing appropriate contact time as indicated for disinfecting solutions.     HPI The patient is here for follow up of their chronic medical problems, including htn, hichol, hypothyroid, prediabetes, h/o breast ca, h/o tongue ca   Overall she is doing very well.  She still has her swallowing issues and eat slowly, but her appetite is very good.  Most things she is able to eat.  She did lose 2 pounds by our scale since she was here 6 months ago and was not happy about that.  She feels like she is eating a lot and eating most things and was surprised that she lost any weight.  She is very active.   Medications and allergies reviewed with patient and updated if appropriate.  Patient Active Problem List   Diagnosis Date Noted   Dysphonia 06/29/2020   Aortic atherosclerosis ([place]) 05/27/2020   Hyponatremia 03/28/2019   Cancer of base of tongue ([place]) 03/24/2019   Oropharyngeal dysphagia 03/14/2019   Dysphasia 01/27/2019   Hypertension 12/13/2016   S/P mastectomy, left 10/18/2016   History of breast cancer 10/18/2016   Port catheter in place 05/23/2016   Breast cancer of upper-outer quadrant of left female breast ([place]) 04/30/2016   Conductive hearing loss in right ear 12/09/2015   Enteritis 05/30/2013   Prediabetes    Macular degeneration, dry    Vertigo    Vitamin D deficiency 11/17/2008   COLONIC POLYPS, HX OF 11/17/2008   Hypothyroidism 11/19/2007   HYPERLIPIDEMIA 11/19/2007   Osteopenia 02/18/2007    Current Outpatient Medications on File Prior to Visit  Medication Sig Dispense Refill   amLODipine (NORVASC) 5 MG tablet TAKE ONE TABLET BY MOUTH ONCE  DAILY 90 tablet 3   Calcium Carb-Cholecalciferol (CALCIUM 600+D3 PO) Take 1 tablet by mouth 2 (two) times daily.     Cholecalciferol (VITAMIN D3) 1000 UNITS CAPS Take 1,000 Units by mouth daily.     levothyroxine (SYNTHROID) 75 MCG tablet TAKE ONE TABLET BY MOUTH ONCE DAILY 90 tablet 1   Multiple Vitamins-Minerals (PRESERVISION AREDS PO) Take by mouth in the morning and at bedtime.     simvastatin (ZOCOR) 20 MG tablet TAKE ONE TABLET BY MOUTH EVERYDAY AT BEDTIME 90 tablet 1   [DISCONTINUED] prochlorperazine (COMPAZINE) 10 MG tablet Take 1 tablet (10 mg total) by mouth every 6 (six) hours as needed (Nausea or vomiting). (Patient not taking: Reported on 07/27/2016) 30 tablet 1   No current facility-administered medications on file prior to visit.    Past Medical History:  Diagnosis Date   Hx of colonic polyps    Hyperlipidemia    Hypothyroidism    Macular degeneration, dry    Osteopenia    spine BMD T score - 1.69   PONV (postoperative nausea and vomiting)    pt has vertigo and can not lie flat   Prediabetes    FBS 136  on 08/29/2010; A1c 6.2%   Vertigo     Past Surgical History:  Procedure Laterality Date   CATARACT EXTRACTION  2009   OS   COLONOSCOPY W/ POLYPECTOMY     X 2; last 2012. Dr Watt Climes   IR  GASTROSTOMY TUBE MOD SED  04/17/2019   IR GASTROSTOMY TUBE REMOVAL  08/29/2019   IR GENERIC HISTORICAL  05/09/2016   IR US GUIDE VASC ACCESS RIGHT 05/09/2016 Jacqulynn Cadet, MD [facility]-INTERV RAD   IR GENERIC HISTORICAL  05/09/2016   IR FLUORO GUIDE CV LINE RIGHT 05/09/2016 Jacqulynn Cadet, MD [facility]INTER[facility] RAD   MODIFIED MASTECTOMY Left 10/18/2016   Procedure: LEFT MODIFIED RADICAL MASTECTOMY;  Surgeon: [name], MD;  Location: [facility];  Service: General;  Laterality: Left;   PILONIDAL CYST EXCISION     X 3   TONSILLECTOMY AND ADENOIDECTOMY     TOTAL ABDOMINAL HYSTERECTOMY     w/ bladder repair    Social History   Socioeconomic History   Marital status:  Widowed    Spouse name: Not on file   Number of children: 4   Years of education: Not on file   Highest education level: Not on file  Occupational History   Occupation: retired  Tobacco Use   Smoking status: Never   Smokeless tobacco: Never  Vaping Use   Vaping Use: Never used  Substance and Sexual Activity   Alcohol use: No    Comment:     Drug use: No   Sexual activity: Not on file  Other Topics Concern   Not on file  Social History Narrative   Regular exercise- yes 54mpd w/o symptoms   Social Determinants of Health   Financial Resource Strain: Low Risk    Difficulty of Paying Living Expenses: Not hard at all  Food Insecurity: No Food Insecurity   Worried About Charity fundraiser in the Last Year: Never true   [place] in the Last Year: Never true  Transportation Needs: No Transportation Needs   Lack of Transportation (Medical): No   Lack of Transportation (Non-Medical): No  Physical Activity: Sufficiently Active   Days of Exercise per Week: 5 days   Minutes of Exercise per Session: 30 min  Stress: No Stress Concern Present   Feeling of Stress : Not at all  Social Connections: Moderately Integrated   Frequency of Communication with Friends and Family: More than three times a week   Frequency of Social Gatherings with Friends and Family: More than three times a week   Attends Religious Services: More than 4 times per year   Active Member of [employer] or Organizations: Yes   Attends Archivist Meetings: More than 4 times per year   Marital Status: Widowed    Family History  Problem Relation Age of Onset   Osteoporosis Mother    Leukemia Mother    Cancer Maternal Uncle        ? stomach   Aneurysm Father        AAA   Heart attack Neg Hx     Review of Systems  Constitutional:  Negative for chills and fever.  Respiratory:  Negative for cough, shortness of breath and wheezing.   Cardiovascular:  Negative for chest pain.  Gastrointestinal:  Negative  for abdominal pain, constipation, diarrhea and nausea.  Neurological:  Negative for light-headedness and headaches.      Objective:   Vitals:   06/22/21 1047  BP: 136/80  Pulse: 61  Temp: 98 F (36.7 C)  SpO2: 99%   BP Readings from Last 3 Encounters:  06/22/21 136/80  05/23/21 (!) 164/57  12/21/20 126/80   Wt Readings from Last 3 Encounters:  06/22/21 100 lb (45.4 kg)  05/23/21 102 lb (46.3  kg)  12/21/20 102 lb (46.3 kg)   Body mass index is 17.71 kg/m.   Physical Exam    Constitutional: Appears well-developed and well-nourished. No distress.  HENT:  Head: Normocephalic and atraumatic.  Neck: Neck supple. No tracheal deviation present. No thyromegaly present.  No cervical lymphadenopathy Cardiovascular: Normal rate, regular rhythm and normal heart sounds.  No murmur heard. No carotid bruit .  No edema Pulmonary/Chest: Effort normal and breath sounds normal. No respiratory distress. No has no wheezes. No rales.  Skin: Skin is warm and dry. Not diaphoretic.  Psychiatric: Normal mood and affect. Behavior is normal.      Assessment & Plan:    See Problem List for Assessment and Plan of chronic medical problems.

## 2021-06-22 ENCOUNTER — Other Ambulatory Visit: Payer: Self-pay

## 2021-06-22 ENCOUNTER — Ambulatory Visit (INDEPENDENT_AMBULATORY_CARE_PROVIDER_SITE_OTHER): Payer: PPO | Admitting: Internal Medicine

## 2021-06-22 ENCOUNTER — Encounter: Payer: Self-pay | Admitting: Internal Medicine

## 2021-06-22 VITALS — BP 136/80 | HR 61 | Temp 98.0°F | Ht 63.0 in | Wt 100.0 lb

## 2021-06-22 DIAGNOSIS — R1312 Dysphagia, oropharyngeal phase: Secondary | ICD-10-CM

## 2021-06-22 DIAGNOSIS — E039 Hypothyroidism, unspecified: Secondary | ICD-10-CM | POA: Diagnosis not present

## 2021-06-22 DIAGNOSIS — R7303 Prediabetes: Secondary | ICD-10-CM | POA: Diagnosis not present

## 2021-06-22 DIAGNOSIS — I700 Atherosclerosis of aorta: Secondary | ICD-10-CM | POA: Diagnosis not present

## 2021-06-22 DIAGNOSIS — I10 Essential (primary) hypertension: Secondary | ICD-10-CM

## 2021-06-22 DIAGNOSIS — Z23 Encounter for immunization: Secondary | ICD-10-CM | POA: Diagnosis not present

## 2021-06-22 DIAGNOSIS — E782 Mixed hyperlipidemia: Secondary | ICD-10-CM

## 2021-06-22 LAB — CBC WITH DIFFERENTIAL/PLATELET
Basophils Absolute: 0 10*3/uL (ref 0.0–0.1)
Basophils Relative: 0.8 % (ref 0.0–3.0)
Eosinophils Absolute: 0.1 10*3/uL (ref 0.0–0.7)
Eosinophils Relative: 1.2 % (ref 0.0–5.0)
HCT: 35.3 % — ABNORMAL LOW (ref 36.0–46.0)
Hemoglobin: 12.3 g/dL (ref 12.0–15.0)
Lymphocytes Relative: 15.1 % (ref 12.0–46.0)
Lymphs Abs: 0.9 10*3/uL (ref 0.7–4.0)
MCHC: 35 g/dL (ref 30.0–36.0)
MCV: 100.6 fl — ABNORMAL HIGH (ref 78.0–100.0)
Monocytes Absolute: 0.6 10*3/uL (ref 0.1–1.0)
Monocytes Relative: 11.2 % (ref 3.0–12.0)
Neutro Abs: 4.1 10*3/uL (ref 1.4–7.7)
Neutrophils Relative %: 71.7 % (ref 43.0–77.0)
Platelets: 235 10*3/uL (ref 150.0–400.0)
RBC: 3.51 Mil/uL — ABNORMAL LOW (ref 3.87–5.11)
RDW: 12.7 % (ref 11.5–15.5)
WBC: 5.7 10*3/uL (ref 4.0–10.5)

## 2021-06-22 LAB — COMPREHENSIVE METABOLIC PANEL
ALT: 20 U/L (ref 0–35)
AST: 24 U/L (ref 0–37)
Albumin: 4.3 g/dL (ref 3.5–5.2)
Alkaline Phosphatase: 62 U/L (ref 39–117)
BUN: 12 mg/dL (ref 6–23)
CO2: 31 mEq/L (ref 19–32)
Calcium: 9.9 mg/dL (ref 8.4–10.5)
Chloride: 95 mEq/L — ABNORMAL LOW (ref 96–112)
Creatinine, Ser: 0.7 mg/dL (ref 0.40–1.20)
GFR: 77.04 mL/min (ref >60.00)
Glucose, Bld: 116 mg/dL — ABNORMAL HIGH (ref 70–99)
Potassium: 4.1 mEq/L (ref 3.5–5.1)
Sodium: 132 mEq/L — ABNORMAL LOW (ref 135–145)
Total Bilirubin: 0.4 mg/dL (ref 0.2–1.2)
Total Protein: 7.4 g/dL (ref 6.0–8.3)

## 2021-06-22 LAB — TSH: TSH: 0.43 u[IU]/mL (ref 0.35–5.50)

## 2021-06-22 LAB — HEMOGLOBIN A1C: Hgb A1c MFr Bld: 5.9 % (ref 4.6–6.5)

## 2021-06-22 NOTE — Assessment & Plan Note (Signed)
Chronic Continue simvastatin 20 mg daily Lipids have been well controlled She is very active and eating well

## 2021-06-22 NOTE — Assessment & Plan Note (Signed)
Chronic Related to radiation therapy for her tongue cancer She eats slow and has to eat softer foods, but has a great appetite and feels like she eats a lot She has lost 2 pounds in the past 6 months and will either increase her fluid intake or restart boost

## 2021-06-22 NOTE — Assessment & Plan Note (Signed)
Chronic  Clinically euthyroid, but has lost a little weight-2 pounds in the past 6 months-?  Related to an overactive thyroid or not Currently taking levothyroxine 75 mcg daily Check tsh  Titrate med dose if needed

## 2021-06-22 NOTE — Assessment & Plan Note (Signed)
Chronic °Blood pressure well controlled °CMP °Continue amlodipine 5 mg daily °

## 2021-06-22 NOTE — Assessment & Plan Note (Signed)
Chronic Check a1c Low sugar / carb diet Stressed regular exercise  

## 2021-06-22 NOTE — Assessment & Plan Note (Signed)
Chronic Regular exercise and healthy diet encouraged Lipids have been well controlled Continue statin 20 mg daily

## 2021-06-24 DIAGNOSIS — I10 Essential (primary) hypertension: Secondary | ICD-10-CM | POA: Diagnosis not present

## 2021-06-24 DIAGNOSIS — Z23 Encounter for immunization: Secondary | ICD-10-CM | POA: Diagnosis not present

## 2021-06-24 DIAGNOSIS — E782 Mixed hyperlipidemia: Secondary | ICD-10-CM | POA: Diagnosis not present

## 2021-06-24 DIAGNOSIS — R7303 Prediabetes: Secondary | ICD-10-CM | POA: Diagnosis not present

## 2021-06-24 DIAGNOSIS — E039 Hypothyroidism, unspecified: Secondary | ICD-10-CM | POA: Diagnosis not present

## 2021-06-24 DIAGNOSIS — I700 Atherosclerosis of aorta: Secondary | ICD-10-CM | POA: Diagnosis not present

## 2021-06-24 DIAGNOSIS — R1312 Dysphagia, oropharyngeal phase: Secondary | ICD-10-CM | POA: Diagnosis not present

## 2021-06-24 NOTE — Addendum Note (Signed)
Addended by: Marcina Millard on: 06/24/2021 01:34 PM   Modules accepted: Orders

## 2021-07-18 ENCOUNTER — Other Ambulatory Visit: Payer: Self-pay | Admitting: Internal Medicine

## 2021-07-21 ENCOUNTER — Telehealth: Payer: Self-pay | Admitting: Pharmacist

## 2021-07-21 NOTE — Progress Notes (Signed)
    Chronic Care Management Pharmacy Assistant   Name: Jessica Sherman  MRN: [id number] DOB: 1931-04-02   Reason for Encounter: Medication Review    Recent office visits:  06/22/21 [name], MD-PCP (hypertension) blood work ordered, no med changes  Recent consult visits:  None ID  Hospital visits:  None in previous 6 months  Medications: Outpatient Encounter Medications as of 07/21/2021  Medication Sig Note   amLODipine (NORVASC) 5 MG tablet TAKE ONE TABLET BY MOUTH ONCE DAILY    Calcium Carb-Cholecalciferol (CALCIUM 600+D3 PO) Take 1 tablet by mouth 2 (two) times daily. 12/02/2019: Patient cannot swallow large pill right now   Cholecalciferol (VITAMIN D3) 1000 UNITS CAPS Take 1,000 Units by mouth daily.    levothyroxine (SYNTHROID) 75 MCG tablet TAKE ONE TABLET BY MOUTH DAILY    Multiple Vitamins-Minerals (PRESERVISION AREDS PO) Take by mouth in the morning and at bedtime.    simvastatin (ZOCOR) 20 MG tablet TAKE ONE TABLET BY MOUTH EVERYDAY AT BEDTIME    [DISCONTINUED] prochlorperazine (COMPAZINE) 10 MG tablet Take 1 tablet (10 mg total) by mouth every 6 (six) hours as needed (Nausea or vomiting). (Patient not taking: Reported on 07/27/2016)    No facility-administered encounter medications on file as of 07/21/2021.   BP Readings from Last 3 Encounters:  06/22/21 136/80  05/23/21 (!) 164/57  12/21/20 126/80    Lab Results  Component Value Date   HGBA1C 5.9 06/22/2021      Last adherence delivery date:04/29/21      Patient is due for next adherence delivery on: 08/03/21  Spoke with patient on 07/21/21 reviewed medications and coordinated delivery.  This delivery to include: Vials  90 Days  Simvastatin 20 mg Take one tablet everyday at bedtime Amlodipine 5 mg Tab Take one tablet by mouth once daily Levothyroxine 75 mcg Take one tablet by mouth once daily     Patient declined the following medications this month:   Any concerns about your medications?  No  How often do you forget or accidentally miss a dose? Never  Do you use a pillbox? No  Is patient in packaging No    No refill request needed.  Confirmed delivery date of 08/03/21, advised patient that pharmacy will contact them the morning of delivery.  Recent blood pressure readings are as follows:Patient only get blood pressure read at doctors office   Annual wellness visit in last year? No Most Recent BP reading:136/80-10/12/22   [place] Pharmacist Assistant [phone]

## 2021-08-26 DIAGNOSIS — Z961 Presence of intraocular lens: Secondary | ICD-10-CM | POA: Diagnosis not present

## 2021-08-26 DIAGNOSIS — H524 Presbyopia: Secondary | ICD-10-CM | POA: Diagnosis not present

## 2021-08-26 DIAGNOSIS — H353132 Nonexudative age-related macular degeneration, bilateral, intermediate dry stage: Secondary | ICD-10-CM | POA: Diagnosis not present

## 2021-08-26 DIAGNOSIS — H04123 Dry eye syndrome of bilateral lacrimal glands: Secondary | ICD-10-CM | POA: Diagnosis not present

## 2021-10-24 ENCOUNTER — Other Ambulatory Visit: Payer: Self-pay | Admitting: Internal Medicine

## 2021-11-03 NOTE — Progress Notes (Signed)
Jessica Sherman presents today for follow up of radiation to her base of tongue completed on 06/02/2019  Pain issues, if any: Patient denies any issues or concerns Using a feeding tube?: N/A Weight changes, if any:  Wt Readings from Last 3 Encounters:  11/04/21 100 lb 6.4 oz (45.5 kg)  06/22/21 100 lb (45.4 kg)  05/23/21 102 lb (46.3 kg)   Swallowing issues, if any: Occasionally. Reports she tends to stick to soups and softer foods.  Smoking or chewing tobacco? None Using fluoride trays daily? Occasionally; Sees her community dentist regularly every 6 months Last ENT visit was on: 12/30/2020 Saw Dr. Melida Quitter: "--Impression & Plans:  Jessica Sherman is a 88 y.o. female with history of tongue base cancer and bilateral cerumen impaction. - The ears were cleared of obstructing cerumen and are healthy-appearing. - There is no sign of recurrence of tongue base cancer. She follows-up with Dr. Isidore Moos this summer. She will follow-up with me in six months." Other notable issues, if any: Denies any ear or jaw pain, or difficulty opening her mouth. Denies any swelling under her chin or down her neck. Reports she is sleeping well and since she restarted an iron supplement her energy levels have improved. Overall she reports she's in good spirits, and fells well.

## 2021-11-04 ENCOUNTER — Encounter: Payer: Self-pay | Admitting: Radiation Oncology

## 2021-11-04 ENCOUNTER — Ambulatory Visit
Admission: RE | Admit: 2021-11-04 | Discharge: 2021-11-04 | Disposition: A | Discharge status: Home / Self Care | Payer: PPO | Source: Ambulatory Visit | Attending: Radiation Oncology | Admitting: Radiation Oncology

## 2021-11-04 ENCOUNTER — Other Ambulatory Visit: Payer: Self-pay

## 2021-11-04 VITALS — BP 177/68 | HR 69 | Temp 97.7°F | Resp 20 | Ht 64.0 in | Wt 100.4 lb

## 2021-11-04 DIAGNOSIS — Z923 Personal history of irradiation: Secondary | ICD-10-CM | POA: Insufficient documentation

## 2021-11-04 DIAGNOSIS — C01 Malignant neoplasm of base of tongue: Secondary | ICD-10-CM | POA: Diagnosis not present

## 2021-11-04 DIAGNOSIS — Z853 Personal history of malignant neoplasm of breast: Secondary | ICD-10-CM | POA: Diagnosis not present

## 2021-11-04 DIAGNOSIS — Z08 Encounter for follow-up examination after completed treatment for malignant neoplasm: Secondary | ICD-10-CM | POA: Diagnosis not present

## 2021-11-04 MED ORDER — OXYMETAZOLINE HCL 0.05 % NA SOLN
2.0000 | Freq: Once | NASAL | Status: AC
  Administered 2021-11-04: 2 via NASAL
  Filled 2021-11-04: qty 30

## 2021-11-04 NOTE — Progress Notes (Signed)
Radiation Oncology         [phone] ________________________________  Name: Jessica Sherman MRN: [id number]  Date: 11/04/2021  DOB: 1931-01-02  Follow-Up Visit Note  Outpatient, in person.  CC: [name], MD  [name], MD  Diagnosis and Prior Radiotherapy:       ICD-10-CM   1. Cancer of base of tongue (HCC)  C01 oxymetazoline (AFRIN) 0.05 % nasal spray 2 spray       Cancer Staging  Breast cancer of upper-outer quadrant of left female breast ([place]) Staging form: Breast, AJCC 7th Edition - Clinical: Stage IIB (T2, N1, M0) - Unsigned - Pathologic: yT0, N0, cM0 - Unsigned Stage prefix: Post-therapy  Cancer of base of tongue (HCC) Staging form: Pharynx - HPV-Mediated Oropharynx, AJCC 8th Edition - Clinical stage from 03/21/2019: Stage I (cT2, cN1, cM0, p16+) - Signed by [name], MD on 06/18/2019   04/14/2019 through 06/02/2019  Site Technique Total Dose Dose per Fx Completed Fx Beam Energies  Head & neck: HN_BOT IMRT 70/70 2 35/35 6X   CHIEF COMPLAINT:  Here for follow-up and surveillance of base of tongue cancer  Narrative:       Ms. Hagenow presents today for follow up of radiation to her base of tongue completed on 06/02/2019  Pain issues, if any: Patient denies any issues or concerns Using a feeding tube?: N/A Weight changes, if any:  Wt Readings from Last 3 Encounters:  11/04/21 100 lb 6.4 oz (45.5 kg)  06/22/21 100 lb (45.4 kg)  05/23/21 102 lb (46.3 kg)   Swallowing issues, if any: Occasionally. Reports she tends to stick to soups and softer foods.  Smoking or chewing tobacco? None Using fluoride trays daily? Occasionally; Sees her community dentist regularly every 6 months Last ENT visit was on: 12/30/2020 Saw Dr. Melida Quitter: "--Impression & Plans:  Jessica Sherman is a 88 y.o. female with history of tongue base cancer and bilateral cerumen impaction. - The ears were cleared of obstructing cerumen and are healthy-appearing. - There is no  sign of recurrence of tongue base cancer. She follows-up with Dr. Isidore Moos this summer. She will follow-up with me in six months." Other notable issues, if any: Denies any ear or jaw pain, or difficulty opening her mouth. Denies any swelling under her chin or down her neck. Reports she is sleeping well and since she restarted an iron supplement her energy levels have improved. Overall she reports she's in good spirits, and fells well.    Vitals:   11/04/21 1407  BP: (!) 177/68  Pulse: 69  Resp: 20  Temp: 97.7 F (36.5 C)  SpO2: 100%    ALLERGIES:  is allergic to other.  Meds: Current Outpatient Medications  Medication Sig Dispense Refill   amLODipine (NORVASC) 5 MG tablet TAKE ONE TABLET BY MOUTH ONCE DAILY 90 tablet 3   Calcium Carb-Cholecalciferol (CALCIUM 600+D3 PO) Take 1 tablet by mouth 2 (two) times daily.     Cholecalciferol (VITAMIN D3) 1000 UNITS CAPS Take 1,000 Units by mouth daily.     levothyroxine (SYNTHROID) 75 MCG tablet TAKE ONE TABLET BY MOUTH DAILY 90 tablet 1   Multiple Vitamins-Minerals (PRESERVISION AREDS PO) Take by mouth in the morning and at bedtime.     simvastatin (ZOCOR) 20 MG tablet TAKE ONE TABLET BY MOUTH EVERYDAY AT BEDTIME 90 tablet 1   No current facility-administered medications for this encounter.    Physical Findings: The patient is in no acute distress. Patient is alert  and oriented. Wt Readings from Last 3 Encounters:  11/04/21 100 lb 6.4 oz (45.5 kg)  06/22/21 100 lb (45.4 kg)  05/23/21 102 lb (46.3 kg)    height is 5\' 4"  (1.626 m) and weight is 100 lb 6.4 oz (45.5 kg). Her temperature is 97.7 F (36.5 C). Her blood pressure is 177/68 (abnormal) and her pulse is 69. Her respiration is 20 and oxygen saturation is 100%. .  General: Alert and oriented, in no acute distress  HEENT:  Oropharynx and oral cavity are clear Neck: is without palpable cervical or supraclavicular masses Skin: skin on neck is warm, dry, intact, no lesions.  Heart RRR  no murmurs Chest CTAB Ext: no edema  PROCEDURE NOTE: After obtaining consent and anesthetizing the nasal cavity with topical decongestant, the flexible endoscope was introduced and passed through the nasal cavity.  No lesions appreciated in the nasopharynx, oropharynx, hypopharynx, or larynx.  The cords are symmetrically mobile without lesions.    Lab Findings: Lab Results  Component Value Date   WBC 5.7 06/22/2021   HGB 12.3 06/22/2021   HCT 35.3 (L) 06/22/2021   MCV 100.6 (H) 06/22/2021   PLT 235.0 06/22/2021    Lab Results  Component Value Date   TSH 0.43 06/22/2021    Radiographic Findings: No results found.  Impression/Plan:    1) Head and Neck Cancer Status: NED (cured)  2) Nutritional Status: eating well, good appetite, relatively stable weight ~100lb PEG tube: has been removed  3) Swallowing: good function, continue exercises via SLP  4) Dental: Encouraged to continue regular followup with dentistry and regular dental hygiene; Sees her community dentist regularly every 6 months  5)  Thyroid function:  Take levothyroxine per outside MD (preceding condition). Energy is great. Lab Results  Component Value Date   TSH 0.43 06/22/2021   6) She will see H+N survivorship provider Thedore Mins) at the [facility] in August. I will see her back PRN.   On date of service, in total, I spent 25 minutes on this encounter. Patient was seen in person.  _____________________________________   [name], MD

## 2021-12-20 ENCOUNTER — Encounter: Payer: Self-pay | Admitting: Internal Medicine

## 2021-12-20 NOTE — Progress Notes (Signed)
? ? ? ? ?  Subjective:  ? ? Patient ID: Jessica Sherman, female    DOB: 1931/05/18, 88 y.o.   MRN: [id number] ? ?This visit occurred during the SARS-CoV-2 public health emergency.  Safety protocols were in place, including screening questions prior to the visit, additional usage of staff PPE, and extensive cleaning of exam room while observing appropriate contact time as indicated for disinfecting solutions.   ? ? ?HPI ?Jessica Sherman is here for follow up of her chronic medical problems, including htn, hld, hypothyroid, prediabetes, h/o breast ca, h/o  tongue ca ? ?Ck b12 - elevated mcv ? ?Medications and allergies reviewed with patient and updated if appropriate. ? ?Current Outpatient Medications on File Prior to Visit  ?Medication Sig Dispense Refill  ? amLODipine (NORVASC) 5 MG tablet TAKE ONE TABLET BY MOUTH ONCE DAILY 90 tablet 3  ? Calcium Carb-Cholecalciferol (CALCIUM 600+D3 PO) Take 1 tablet by mouth 2 (two) times daily.    ? Cholecalciferol (VITAMIN D3) 1000 UNITS CAPS Take 1,000 Units by mouth daily.    ? levothyroxine (SYNTHROID) 75 MCG tablet TAKE ONE TABLET BY MOUTH DAILY 90 tablet 1  ? Multiple Vitamins-Minerals (PRESERVISION AREDS PO) Take by mouth in the morning and at bedtime.    ? simvastatin (ZOCOR) 20 MG tablet TAKE ONE TABLET BY MOUTH EVERYDAY AT BEDTIME 90 tablet 1  ? [DISCONTINUED] prochlorperazine (COMPAZINE) 10 MG tablet Take 1 tablet (10 mg total) by mouth every 6 (six) hours as needed (Nausea or vomiting). (Patient not taking: Reported on 07/27/2016) 30 tablet 1  ? ?No current facility-administered medications on file prior to visit.  ? ? ? ?Review of Systems ? ?   ?Objective:  ?There were no vitals filed for this visit. ?BP Readings from Last 3 Encounters:  ?11/04/21 (!) 177/68  ?06/22/21 136/80  ?05/23/21 (!) 164/57  ? ?Wt Readings from Last 3 Encounters:  ?11/04/21 100 lb 6.4 oz (45.5 kg)  ?06/22/21 100 lb (45.4 kg)  ?05/23/21 102 lb (46.3 kg)  ? ?There is no height or weight on file to  calculate BMI. ? ?  ?Physical Exam ?   ? ?Lab Results  ?Component Value Date  ? WBC 5.7 06/22/2021  ? HGB 12.3 06/22/2021  ? HCT 35.3 (L) 06/22/2021  ? PLT 235.0 06/22/2021  ? GLUCOSE 116 (H) 06/22/2021  ? CHOL 126 12/21/2020  ? TRIG 96.0 12/21/2020  ? HDL 64.90 12/21/2020  ? LDLDIRECT 57.0 01/27/2019  ? [place] 42 12/21/2020  ? ALT 20 06/22/2021  ? AST 24 06/22/2021  ? NA 132 (L) 06/22/2021  ? K 4.1 06/22/2021  ? CL 95 (L) 06/22/2021  ? CREATININE 0.70 06/22/2021  ? BUN 12 06/22/2021  ? CO2 31 06/22/2021  ? TSH 0.43 06/22/2021  ? INR 0.9 04/17/2019  ? HGBA1C 5.9 06/22/2021  ? ? ? ?Assessment & Plan:  ? ? ?See Problem List for Assessment and Plan of chronic medical problems.  ? ?This encounter was created in error - please disregard. ?

## 2021-12-20 NOTE — Patient Instructions (Addendum)
? ? ? ?  Blood work was ordered.   ? ? ?Medications changes include :    ? ? ?Your prescription(s) have been sent to your pharmacy.  ? ? ?A referral was ordered for XX.     Someone from that office will call you to schedule an appointment.  ? ? ?Return in about 6 months (around 06/22/2022) for CPE. ? ?

## 2021-12-21 ENCOUNTER — Encounter: Payer: PPO | Admitting: Internal Medicine

## 2021-12-21 DIAGNOSIS — E039 Hypothyroidism, unspecified: Secondary | ICD-10-CM

## 2021-12-21 DIAGNOSIS — I10 Essential (primary) hypertension: Secondary | ICD-10-CM

## 2021-12-21 DIAGNOSIS — E782 Mixed hyperlipidemia: Secondary | ICD-10-CM

## 2021-12-21 DIAGNOSIS — R7303 Prediabetes: Secondary | ICD-10-CM

## 2021-12-21 NOTE — Assessment & Plan Note (Signed)
Chronic Taking vitamin D daily Check vitamin D level  

## 2021-12-21 NOTE — Assessment & Plan Note (Signed)
Chronic Continue simvastatin 20 mg daily 

## 2021-12-21 NOTE — Assessment & Plan Note (Signed)
Chronic °Blood pressure well controlled °CMP °Continue amlodipine 5 mg daily °

## 2021-12-21 NOTE — Assessment & Plan Note (Signed)
Chronic Check a1c Low sugar / carb diet Stressed regular exercise  

## 2021-12-21 NOTE — Assessment & Plan Note (Signed)
Chronic °Regular exercise and healthy diet encouraged °Check lipid panel  °Continue simvastatin 20 mg daily °

## 2021-12-21 NOTE — Assessment & Plan Note (Signed)
Chronic  Clinically euthyroid Currently taking levothyroxine 75 mcg daily Check tsh  Titrate med dose if needed  

## 2021-12-27 NOTE — Patient Instructions (Addendum)
? ? ? ?  Blood work was ordered.   ? ? ? ?Medications changes include :   none ? ? ? ? ?Return in about 6 months (around 06/29/2022) for follow up. ? ?

## 2021-12-27 NOTE — Progress Notes (Signed)
? ? ? ? ?Subjective:  ? ? Patient ID: Jessica Sherman, female    DOB: [birth year], 88 y.o.   MRN: [id number] ? ?This visit occurred during the SARS-CoV-2 public health emergency.  Safety protocols were in place, including screening questions prior to the visit, additional usage of staff PPE, and extensive cleaning of exam room while observing appropriate contact time as indicated for disinfecting solutions.   ? ? ?HPI ?Dodi is here for follow up of her chronic medical problems, including htn, hld, hypothyroid, prediabetes, h/o breast ca, h/o  tongue ca ? ?She feels great.  Still issues eating - but has good appetite and she eats.   ? ?Medications and allergies reviewed with patient and updated if appropriate. ? ?Current Outpatient Medications on File Prior to Visit  ?Medication Sig Dispense Refill  ? amLODipine (NORVASC) 5 MG tablet TAKE ONE TABLET BY MOUTH ONCE DAILY 90 tablet 3  ? Calcium Carb-Cholecalciferol (CALCIUM 600+D3 PO) Take 1 tablet by mouth 2 (two) times daily.    ? Cholecalciferol (VITAMIN D3) 1000 UNITS CAPS Take 1,000 Units by mouth daily.    ? levothyroxine (SYNTHROID) 75 MCG tablet TAKE ONE TABLET BY MOUTH DAILY 90 tablet 1  ? Multiple Vitamins-Minerals (PRESERVISION AREDS PO) Take by mouth in the morning and at bedtime.    ? simvastatin (ZOCOR) 20 MG tablet TAKE ONE TABLET BY MOUTH EVERYDAY AT BEDTIME 90 tablet 1  ? [DISCONTINUED] prochlorperazine (COMPAZINE) 10 MG tablet Take 1 tablet (10 mg total) by mouth every 6 (six) hours as needed (Nausea or vomiting). (Patient not taking: Reported on 07/27/2016) 30 tablet 1  ? ?No current facility-administered medications on file prior to visit.  ? ? ? ?Review of Systems  ?Constitutional:  Negative for appetite change, chills and fever.  ?HENT:  Positive for trouble swallowing (chronic) and voice change (chronic).   ?Respiratory:  Negative for cough, shortness of breath and wheezing.   ?Cardiovascular:  Negative for chest pain, palpitations and leg  swelling.  ?Gastrointestinal:  Negative for abdominal pain and nausea.  ?Musculoskeletal:  Positive for neck pain.  ?Neurological:  Negative for light-headedness and headaches.  ?Psychiatric/Behavioral:  Negative for dysphoric mood. The patient is not nervous/anxious.   ? ?   ?Objective:  ? ?Vitals:  ? 12/28/21 1408  ?BP: 132/70  ?Pulse: 73  ?Temp: 98.1 ?F (36.7 ?C)  ?SpO2: 97%  ? ?BP Readings from Last 3 Encounters:  ?12/28/21 132/70  ?11/04/21 (!) 177/68  ?06/22/21 136/80  ? ?Wt Readings from Last 3 Encounters:  ?12/28/21 99 lb (44.9 kg)  ?11/04/21 100 lb 6.4 oz (45.5 kg)  ?06/22/21 100 lb (45.4 kg)  ? ?Body mass index is 16.99 kg/m?. ? ?  ?Physical Exam ?Constitutional:   ?   General: She is not in acute distress. ?   Appearance: Normal appearance.  ?HENT:  ?   Head: Normocephalic and atraumatic.  ?Eyes:  ?   Conjunctiva/sclera: Conjunctivae normal.  ?Cardiovascular:  ?   Rate and Rhythm: Normal rate and regular rhythm.  ?   Heart sounds: Normal heart sounds. No murmur heard. ?Pulmonary:  ?   Effort: Pulmonary effort is normal. No respiratory distress.  ?   Breath sounds: Normal breath sounds. No wheezing.  ?Musculoskeletal:  ?   Cervical back: Neck supple.  ?   Right lower leg: No edema.  ?   Left lower leg: No edema.  ?Lymphadenopathy:  ?   Cervical: No cervical adenopathy.  ?Skin: ?   General: Skin is  warm and dry.  ?   Findings: No rash.  ?Neurological:  ?   Mental Status: She is alert. Mental status is at baseline.  ?Psychiatric:     ?   Mood and Affect: Mood normal.     ?   Behavior: Behavior normal.  ? ?   ? ?Lab Results  ?Component Value Date  ? WBC 5.7 06/22/2021  ? HGB 12.3 06/22/2021  ? HCT 35.3 (L) 06/22/2021  ? PLT 235.0 06/22/2021  ? GLUCOSE 116 (H) 06/22/2021  ? CHOL 126 12/21/2020  ? TRIG 96.0 12/21/2020  ? HDL 64.90 12/21/2020  ? LDLDIRECT 57.0 01/27/2019  ? [place] 42 12/21/2020  ? ALT 20 06/22/2021  ? AST 24 06/22/2021  ? NA 132 (L) 06/22/2021  ? K 4.1 06/22/2021  ? CL 95 (L) 06/22/2021  ?  CREATININE 0.70 06/22/2021  ? BUN 12 06/22/2021  ? CO2 31 06/22/2021  ? TSH 0.43 06/22/2021  ? INR 0.9 04/17/2019  ? HGBA1C 5.9 06/22/2021  ? ? ? ?Assessment & Plan:  ? ? ?See Problem List for Assessment and Plan of chronic medical problems.  ? ? ?

## 2021-12-28 ENCOUNTER — Encounter: Payer: Self-pay | Admitting: Internal Medicine

## 2021-12-28 ENCOUNTER — Ambulatory Visit (INDEPENDENT_AMBULATORY_CARE_PROVIDER_SITE_OTHER): Payer: PPO | Admitting: Internal Medicine

## 2021-12-28 ENCOUNTER — Ambulatory Visit: Payer: PPO

## 2021-12-28 VITALS — BP 132/70 | HR 73 | Temp 98.1°F | Ht 64.0 in | Wt 99.0 lb

## 2021-12-28 DIAGNOSIS — R7303 Prediabetes: Secondary | ICD-10-CM | POA: Diagnosis not present

## 2021-12-28 DIAGNOSIS — E782 Mixed hyperlipidemia: Secondary | ICD-10-CM

## 2021-12-28 DIAGNOSIS — E039 Hypothyroidism, unspecified: Secondary | ICD-10-CM | POA: Diagnosis not present

## 2021-12-28 DIAGNOSIS — I700 Atherosclerosis of aorta: Secondary | ICD-10-CM

## 2021-12-28 DIAGNOSIS — E559 Vitamin D deficiency, unspecified: Secondary | ICD-10-CM

## 2021-12-28 DIAGNOSIS — I10 Essential (primary) hypertension: Secondary | ICD-10-CM | POA: Diagnosis not present

## 2021-12-28 LAB — CBC WITH DIFFERENTIAL/PLATELET
Basophils Absolute: 0 10*3/uL (ref 0.0–0.1)
Basophils Relative: 0.6 % (ref 0.0–3.0)
Eosinophils Absolute: 0.1 10*3/uL (ref 0.0–0.7)
Eosinophils Relative: 1.6 % (ref 0.0–5.0)
HCT: 33.1 % — ABNORMAL LOW (ref 36.0–46.0)
Hemoglobin: 11.6 g/dL — ABNORMAL LOW (ref 12.0–15.0)
Lymphocytes Relative: 14.9 % (ref 12.0–46.0)
Lymphs Abs: 0.9 10*3/uL (ref 0.7–4.0)
MCHC: 35.1 g/dL (ref 30.0–36.0)
MCV: 101.9 fl — ABNORMAL HIGH (ref 78.0–100.0)
Monocytes Absolute: 0.7 10*3/uL (ref 0.1–1.0)
Monocytes Relative: 11.6 % (ref 3.0–12.0)
Neutro Abs: 4.2 10*3/uL (ref 1.4–7.7)
Neutrophils Relative %: 71.3 % (ref 43.0–77.0)
Platelets: 253 10*3/uL (ref 150.0–400.0)
RBC: 3.25 Mil/uL — ABNORMAL LOW (ref 3.87–5.11)
RDW: 12.7 % (ref 11.5–15.5)
WBC: 5.9 10*3/uL (ref 4.0–10.5)

## 2021-12-28 LAB — COMPREHENSIVE METABOLIC PANEL
ALT: 17 U/L (ref 0–35)
AST: 21 U/L (ref 0–37)
Albumin: 4.1 g/dL (ref 3.5–5.2)
Alkaline Phosphatase: 56 U/L (ref 39–117)
BUN: 15 mg/dL (ref 6–23)
CO2: 29 mEq/L (ref 19–32)
Calcium: 9.4 mg/dL (ref 8.4–10.5)
Chloride: 96 mEq/L (ref 96–112)
Creatinine, Ser: 0.71 mg/dL (ref 0.40–1.20)
GFR: 75.46 mL/min (ref >60.00)
Glucose, Bld: 129 mg/dL — ABNORMAL HIGH (ref 70–99)
Potassium: 4.4 mEq/L (ref 3.5–5.1)
Sodium: 134 mEq/L — ABNORMAL LOW (ref 135–145)
Total Bilirubin: 0.3 mg/dL (ref 0.2–1.2)
Total Protein: 7.3 g/dL (ref 6.0–8.3)

## 2021-12-28 LAB — LIPID PANEL
Cholesterol: 133 mg/dL (ref 0–200)
HDL: 66.7 mg/dL (ref >39.00)
LDL Cholesterol: 49 mg/dL (ref 0–99)
NonHDL: 66.77
Total CHOL/HDL Ratio: 2
Triglycerides: 90 mg/dL (ref 0.0–149.0)
VLDL: 18 mg/dL (ref 0.0–40.0)

## 2021-12-28 LAB — HEMOGLOBIN A1C: Hgb A1c MFr Bld: 6.2 % (ref 4.6–6.5)

## 2021-12-28 LAB — VITAMIN D 25 HYDROXY (VIT D DEFICIENCY, FRACTURES): VITD: 63.08 ng/mL (ref 30.00–100.00)

## 2021-12-28 LAB — TSH: TSH: 1.23 u[IU]/mL (ref 0.35–5.50)

## 2021-12-28 NOTE — Assessment & Plan Note (Signed)
Chronic °Blood pressure well controlled °CMP °Continue amlodipine 5 mg daily °

## 2021-12-28 NOTE — Assessment & Plan Note (Signed)
Chronic °Regular exercise and healthy diet encouraged °Check lipid panel  °Continue simvastatin 20 mg daily °

## 2021-12-28 NOTE — Assessment & Plan Note (Signed)
Chronic Check a1c Low sugar / carb diet Stressed regular exercise  

## 2021-12-28 NOTE — Assessment & Plan Note (Signed)
Chronic Taking vitamin D daily Check vitamin D level  

## 2021-12-28 NOTE — Assessment & Plan Note (Addendum)
Chronic ?Continue simvastatin 20 mg daily ?Encouraged healthy diet, regular exercise ?

## 2021-12-28 NOTE — Assessment & Plan Note (Signed)
Chronic  Clinically euthyroid Currently taking levothyroxine 75 mcg daily Check tsh  Titrate med dose if needed  

## 2021-12-29 ENCOUNTER — Ambulatory Visit (INDEPENDENT_AMBULATORY_CARE_PROVIDER_SITE_OTHER): Payer: PPO

## 2021-12-29 ENCOUNTER — Ambulatory Visit: Payer: PPO

## 2021-12-29 VITALS — Ht 64.0 in | Wt 99.0 lb

## 2021-12-29 DIAGNOSIS — Z0000 Encounter for general adult medical examination without abnormal findings: Secondary | ICD-10-CM

## 2021-12-29 NOTE — Patient Instructions (Signed)
Jessica Sherman , ?Thank you for taking time to come for your Medicare Wellness Visit. I appreciate your ongoing commitment to your health goals. Please review the following plan we discussed and let me know if I can assist you in the future.  ? ?Screening recommendations/referrals: ?Colonoscopy: Screening no longer recommended due to age ?Mammogram: 10/09/2019 ?Bone Density: 04/17/2017 ?Recommended yearly ophthalmology/optometry visit for glaucoma screening and checkup ?Recommended yearly dental visit for hygiene and checkup ? ?Vaccinations: ?Influenza vaccine: 06/24/2021 ?Pneumococcal vaccine: 07/31/2013, 10/08/2015 ?Tdap vaccine: 01/07/2010; due every 10 years (OVERDUE) ?Shingles vaccine: 05/02/2018, 07/01/2018   ?Covid-19:10/16/2019, 11/10/2019, 06/09/2020 ? ?Advanced directives: Yes; documents on file. ? ?Conditions/risks identified: Yes. Client understands the importance of follow-up appointments with providers by attending scheduled visits and discussed goals to eat healthier, increase physical activity 5 times a week for 30 minutes each, exercise the brain by doing stimulating brain exercises (reading, adult coloring, crafting, listening to music, puzzles, etc.), socialize and enjoy life more, get enough sleep at least 8-9 hours average per night and make time for laughter. ? ? ?Next appointment: Please schedule your next Medicare Wellness Visit with your Nurse Health Advisor in 1 year by calling [phone]. ? ? ?Preventive Care 86 Years and Older, Female ?Preventive care refers to lifestyle choices and visits with your health care provider that can promote health and wellness. ?What does preventive care include? ?A yearly physical exam. This is also called an annual well check. ?Dental exams once or twice a year. ?Routine eye exams. Ask your health care provider how often you should have your eyes checked. ?Personal lifestyle choices, including: ?Daily care of your teeth and gums. ?Regular physical activity. ?Eating a  healthy diet. ?Avoiding tobacco and drug use. ?Limiting alcohol use. ?Practicing safe sex. ?Taking low-dose aspirin every day. ?Taking vitamin and mineral supplements as recommended by your health care provider. ?What happens during an annual well check? ?The services and screenings done by your health care provider during your annual well check will depend on your age, overall health, lifestyle risk factors, and family history of disease. ?Counseling  ?Your health care provider may ask you questions about your: ?Alcohol use. ?Tobacco use. ?Drug use. ?Emotional well-being. ?Home and relationship well-being. ?Sexual activity. ?Eating habits. ?History of falls. ?Memory and ability to understand (cognition). ?Work and work Statistician. ?Reproductive health. ?Screening  ?You may have the following tests or measurements: ?Height, weight, and BMI. ?Blood pressure. ?Lipid and cholesterol levels. These may be checked every 5 years, or more frequently if you are over 76 years old. ?Skin check. ?Lung cancer screening. You may have this screening every year starting at age 64 if you have a 30-pack-year history of smoking and currently smoke or have quit within the past 15 years. ?Fecal occult blood test (FOBT) of the stool. You may have this test every year starting at age 30. ?Flexible sigmoidoscopy or colonoscopy. You may have a sigmoidoscopy every 5 years or a colonoscopy every 10 years starting at age 73. ?Hepatitis C blood test. ?Hepatitis B blood test. ?Sexually transmitted disease (STD) testing. ?Diabetes screening. This is done by checking your blood sugar (glucose) after you have not eaten for a while (fasting). You may have this done every 1-3 years. ?Bone density scan. This is done to screen for osteoporosis. You may have this done starting at age 77. ?Mammogram. This may be done every 1-2 years. Talk to your health care provider about how often you should have regular mammograms. ?Talk with your health care  provider  about your test results, treatment options, and if necessary, the need for more tests. ?Vaccines  ?Your health care provider may recommend certain vaccines, such as: ?Influenza vaccine. This is recommended every year. ?Tetanus, diphtheria, and acellular pertussis (Tdap, Td) vaccine. You may need a Td booster every 10 years. ?Zoster vaccine. You may need this after age 63. ?Pneumococcal 13-valent conjugate (PCV13) vaccine. One dose is recommended after age 64. ?Pneumococcal polysaccharide (PPSV23) vaccine. One dose is recommended after age 26. ?Talk to your health care provider about which screenings and vaccines you need and how often you need them. ?This information is not intended to replace advice given to you by your health care provider. Make sure you discuss any questions you have with your health care provider. ?Document Released: 09/24/2015 Document Revised: 05/17/2016 Document Reviewed: 06/29/2015 ?Elsevier Interactive Patient Education ? 2017 [place]. ? ?Fall Prevention in the Home ?Falls can cause injuries. They can happen to people of all ages. There are many things you can do to make your home safe and to help prevent falls. ?What can I do on the outside of my home? ?Regularly fix the edges of walkways and driveways and fix any cracks. ?Remove anything that might make you trip as you walk through a door, such as a raised step or threshold. ?Trim any bushes or trees on the path to your home. ?Use bright outdoor lighting. ?Clear any walking paths of anything that might make someone trip, such as rocks or tools. ?Regularly check to see if handrails are loose or broken. Make sure that both sides of any steps have handrails. ?Any raised decks and porches should have guardrails on the edges. ?Have any leaves, snow, or ice cleared regularly. ?Use sand or salt on walking paths during winter. ?Clean up any spills in your garage right away. This includes oil or grease spills. ?What can I do in the  bathroom? ?Use night lights. ?Install grab bars by the toilet and in the tub and shower. Do not use towel bars as grab bars. ?Use non-skid mats or decals in the tub or shower. ?If you need to sit down in the shower, use a plastic, non-slip stool. ?Keep the floor dry. Clean up any water that spills on the floor as soon as it happens. ?Remove soap buildup in the tub or shower regularly. ?Attach bath mats securely with double-sided non-slip rug tape. ?Do not have throw rugs and other things on the floor that can make you trip. ?What can I do in the bedroom? ?Use night lights. ?Make sure that you have a light by your bed that is easy to reach. ?Do not use any sheets or blankets that are too big for your bed. They should not hang down onto the floor. ?Have a firm chair that has side arms. You can use this for support while you get dressed. ?Do not have throw rugs and other things on the floor that can make you trip. ?What can I do in the kitchen? ?Clean up any spills right away. ?Avoid walking on wet floors. ?Keep items that you use a lot in easy-to-reach places. ?If you need to reach something above you, use a strong step stool that has a grab bar. ?Keep electrical cords out of the way. ?Do not use floor polish or wax that makes floors slippery. If you must use wax, use non-skid floor wax. ?Do not have throw rugs and other things on the floor that can make you trip. ?What can I do with  my stairs? ?Do not leave any items on the stairs. ?Make sure that there are handrails on both sides of the stairs and use them. Fix handrails that are broken or loose. Make sure that handrails are as long as the stairways. ?Check any carpeting to make sure that it is firmly attached to the stairs. Fix any carpet that is loose or worn. ?Avoid having throw rugs at the top or bottom of the stairs. If you do have throw rugs, attach them to the floor with carpet tape. ?Make sure that you have a light switch at the top of the stairs and the  bottom of the stairs. If you do not have them, ask someone to add them for you. ?What else can I do to help prevent falls? ?Wear shoes that: ?Do not have high heels. ?Have rubber bottoms. ?Are comfortable and fi

## 2021-12-29 NOTE — Progress Notes (Signed)
?I connected with Jessica Sherman today by telephone and verified that I am speaking with the correct person using two identifiers. ?Location patient: home ?Location provider: work ?Persons participating in the virtual visit: patient, provider. ?  ?I discussed the limitations, risks, security and privacy concerns of performing an evaluation and management service by telephone and the availability of in person appointments. I also discussed with the patient that there may be a patient responsible charge related to this service. The patient expressed understanding and verbally consented to this telephonic visit.  ?  ?Interactive audio and video telecommunications were attempted between this provider and patient, however failed, due to patient having technical difficulties OR patient did not have access to video capability.  We continued and completed visit with audio only. ? ?Some vital signs may be absent or patient reported.  ? ?Time Spent with patient on telephone encounter: 30 minutes ? ?Subjective:  ? Jessica Sherman is a 88 y.o. female who presents for Medicare Annual (Subsequent) preventive examination. ? ?Review of Systems    ? ?Cardiac Risk Factors include: advanced age (>75mn, >>62women);dyslipidemia;hypertension ? ?   ?Objective:  ?  ?Today's Vitals  ? 12/29/21 1447  ?Weight: 99 lb (44.9 kg)  ?Height: '5\' 4"'$  (1.626 m)  ?PainSc: 0-No pain  ? ?Body mass index is 16.99 kg/m?. ? ? ?  12/29/2021  ?  2:35 PM 11/04/2021  ?  2:33 PM 05/23/2021  ? 10:33 AM 12/10/2020  ?  2:56 PM 10/29/2020  ?  2:29 PM 03/26/2020  ?  2:21 PM 04/17/2019  ?  1:22 PM  ?Advanced Directives  ?Does Patient Have a Medical Advance Directive? Yes Yes Yes Yes Yes Yes   ?Type of AParamedicof ABethesdaLiving will HOcontoLiving will [place]idgewayLiving will Living will;Healthcare Power of AHillsboroughLiving will [place]utanLiving will  HMelvindaleLiving will  ?Does patient want to make changes to medical advance directive? No - Patient declined No - Patient declined  No - Patient declined No - Patient declined No - Patient declined   ?Copy of HYoungsvillein Chart? Yes - validated most recent copy scanned in chart (See row information) Yes - validated most recent copy scanned in chart (See row information) Yes - validated most recent copy scanned in chart (See row information) No - copy requested Yes - validated most recent copy scanned in chart (See row information) Yes - validated most recent copy scanned in chart (See row information) Yes - validated most recent copy scanned in chart (See row information)  ?Would patient like information on creating a medical advance directive?      No - Patient declined No - Patient declined  ? ? ?Current Medications (verified) ?Outpatient Encounter Medications as of 12/29/2021  ?Medication Sig  ? amLODipine (NORVASC) 5 MG tablet TAKE ONE TABLET BY MOUTH ONCE DAILY  ? Cholecalciferol (VITAMIN D3) 1000 UNITS CAPS Take 1,000 Units by mouth daily.  ? levothyroxine (SYNTHROID) 75 MCG tablet TAKE ONE TABLET BY MOUTH DAILY  ? Multiple Vitamins-Minerals (PRESERVISION AREDS PO) Take by mouth in the morning and at bedtime.  ? simvastatin (ZOCOR) 20 MG tablet TAKE ONE TABLET BY MOUTH EVERYDAY AT BEDTIME  ? Calcium Carb-Cholecalciferol (CALCIUM 600+D3 PO) Take 1 tablet by mouth 2 (two) times daily.  ? [DISCONTINUED] prochlorperazine (COMPAZINE) 10 MG tablet Take 1 tablet (10 mg total) by mouth every 6 (six) hours as needed (Nausea  or vomiting). (Patient not taking: Reported on 07/27/2016)  ? ?No facility-administered encounter medications on file as of 12/29/2021.  ? ? ?Allergies (verified) ?Other  ? ?History: ?Past Medical History:  ?Diagnosis Date  ? Hx of colonic polyps   ? Hyperlipidemia   ? Hypothyroidism   ? Macular degeneration, dry   ? Osteopenia   ? spine BMD T score - 1.69  ?  PONV (postoperative nausea and vomiting)   ? pt has vertigo and can not lie flat  ? Prediabetes   ? FBS 136  on 08/29/2010; A1c 6.2%  ? Vertigo   ? ?Past Surgical History:  ?Procedure Laterality Date  ? CATARACT EXTRACTION  2009  ? OS  ? COLONOSCOPY W/ POLYPECTOMY    ? X 2; last 2012. Dr Watt Climes  ? IR GASTROSTOMY TUBE MOD SED  04/17/2019  ? IR GASTROSTOMY TUBE REMOVAL  08/29/2019  ? IR GENERIC HISTORICAL  05/09/2016  ? IR US GUIDE VASC ACCESS RIGHT 05/09/2016 Jacqulynn Cadet, MD [facility]  ? IR GENERIC HISTORICAL  05/09/2016  ? IR FLUORO GUIDE CV LINE RIGHT 05/09/2016 Jacqulynn Cadet, MD [facility]  ? MODIFIED MASTECTOMY Left 10/18/2016  ? Procedure: LEFT MODIFIED RADICAL MASTECTOMY;  Surgeon: [name], MD;  Location: [facility];  Service: General;  Laterality: Left;  ? PILONIDAL CYST EXCISION    ? X 3  ? TONSILLECTOMY AND ADENOIDECTOMY    ? TOTAL ABDOMINAL HYSTERECTOMY    ? w/ bladder repair  ? ?Family History  ?Problem Relation Age of Onset  ? Osteoporosis Mother   ? Leukemia Mother   ? Cancer Maternal Uncle   ?     ? stomach  ? Aneurysm Father   ?     AAA  ? Heart attack Neg Hx   ? ?Social History  ? ?Socioeconomic History  ? Marital status: Widowed  ?  Spouse name: Not on file  ? Number of children: 4  ? Years of education: Not on file  ? Highest education level: Not on file  ?Occupational History  ? Occupation: retired  ?Tobacco Use  ? Smoking status: Never  ? Smokeless tobacco: Never  ?Vaping Use  ? Vaping Use: Never used  ?Substance and Sexual Activity  ? Alcohol use: No  ?  Comment:    ? Drug use: No  ? Sexual activity: Not on file  ?Other Topics Concern  ? Not on file  ?Social History Narrative  ? Regular exercise- yes 52md w/o symptoms  ? ?Social Determinants of Health  ? ?Financial Resource Strain: Low Risk   ? Difficulty of Paying Living Expenses: Not hard at all  ?Food Insecurity: No Food Insecurity  ? Worried About RCharity fundraiserin the Last Year: Never true  ? Ran Out of  Food in the Last Year: Never true  ?Transportation Needs: No Transportation Needs  ? Lack of Transportation (Medical): No  ? Lack of Transportation (Non-Medical): No  ?Physical Activity: Sufficiently Active  ? Days of Exercise per Week: 5 days  ? Minutes of Exercise per Session: 30 min  ?Stress: No Stress Concern Present  ? Feeling of Stress : Not at all  ?Social Connections: Moderately Integrated  ? Frequency of Communication with Friends and Family: More than three times a week  ? Frequency of Social Gatherings with Friends and Family: More than three times a week  ? Attends Religious Services: More than 4 times per year  ? Active Member of Clubs or Organizations:  Yes  ? Attends Club or Organization Meetings: More than 4 times per year  ? Marital Status: Widowed  ? ? ?Tobacco Counseling ?Counseling given: Not Answered ? ? ?Clinical Intake: ? ?Pre-visit preparation completed: Yes ? ?Pain : No/denies pain ?Pain Score: 0-No pain ? ?  ? ?BMI - recorded: 16.99 ?Nutritional Status: BMI <19  Underweight ?Nutritional Risks: None ?Diabetes: No ? ?How often do you need to have someone help you when you read instructions, pamphlets, or other written materials from your doctor or pharmacy?: 1 - Never ?What is the last grade level you completed in school?: HSG; Graduated from [employer]) ? ?Diabetic? no ? ?Interpreter Needed?: No ? ?Information entered by :: Lisette Abu, LPN ? ? ?Activities of Daily Living ? ?  12/29/2021  ?  2:52 PM  ?In your present state of health, do you have any difficulty performing the following activities:  ?Hearing? 1  ?Comment right ear  ?Vision? 0  ?Difficulty concentrating or making decisions? 0  ?Walking or climbing stairs? 0  ?Dressing or bathing? 0  ?Doing errands, shopping? 0  ?Preparing Food and eating ? N  ?Using the Toilet? N  ?In the past six months, have you accidently leaked urine? Y  ?Comment wears protection for leakage  ?Do you have problems with loss of bowel  control? N  ?Managing your Medications? N  ?Managing your Finances? N  ?Housekeeping or managing your Housekeeping? N  ? ? ?Patient Care Team: ?[name], MD as PCP - General (Internal Medicine) ?[name], MD a

## 2022-01-15 ENCOUNTER — Other Ambulatory Visit: Payer: Self-pay | Admitting: Internal Medicine

## 2022-01-15 DIAGNOSIS — I10 Essential (primary) hypertension: Secondary | ICD-10-CM

## 2022-04-10 ENCOUNTER — Encounter: Payer: Self-pay | Admitting: Hematology and Oncology

## 2022-04-17 ENCOUNTER — Other Ambulatory Visit: Payer: Self-pay | Admitting: Internal Medicine

## 2022-04-17 ENCOUNTER — Encounter: Payer: Self-pay | Admitting: Adult Health

## 2022-04-17 ENCOUNTER — Inpatient Hospital Stay: Payer: PPO | Attending: Adult Health | Admitting: Adult Health

## 2022-04-17 ENCOUNTER — Other Ambulatory Visit: Payer: Self-pay

## 2022-04-17 VITALS — BP 178/76 | HR 67 | Temp 97.7°F | Resp 18 | Ht 64.0 in | Wt 99.7 lb

## 2022-04-17 DIAGNOSIS — Z923 Personal history of irradiation: Secondary | ICD-10-CM | POA: Diagnosis not present

## 2022-04-17 DIAGNOSIS — Z85810 Personal history of malignant neoplasm of tongue: Secondary | ICD-10-CM | POA: Diagnosis not present

## 2022-04-17 DIAGNOSIS — Z853 Personal history of malignant neoplasm of breast: Secondary | ICD-10-CM | POA: Diagnosis not present

## 2022-04-17 DIAGNOSIS — Z171 Estrogen receptor negative status [ER-]: Secondary | ICD-10-CM | POA: Diagnosis not present

## 2022-04-17 DIAGNOSIS — C50412 Malignant neoplasm of upper-outer quadrant of left female breast: Secondary | ICD-10-CM | POA: Diagnosis not present

## 2022-04-17 DIAGNOSIS — Z9221 Personal history of antineoplastic chemotherapy: Secondary | ICD-10-CM | POA: Diagnosis not present

## 2022-04-17 DIAGNOSIS — C01 Malignant neoplasm of base of tongue: Secondary | ICD-10-CM

## 2022-04-17 NOTE — Assessment & Plan Note (Signed)
She has no clinical signs of recurrence.  Her weight and swallowing are stable.  She will return on an as needed basis.

## 2022-04-17 NOTE — Progress Notes (Signed)
[facility] Follow up:    [name], MD [place] [address]   DIAGNOSIS:  Cancer Staging  Breast cancer of upper-outer quadrant of left female breast [facility]) Staging form: Breast, AJCC 7th Edition - Clinical: Stage IIB (T2, N1, M0) - Unsigned - Pathologic: yT0, N0, cM0 - Unsigned Stage prefix: Post-therapy  Cancer of base of tongue ([place]) Staging form: Pharynx - HPV-Mediated Oropharynx, AJCC 8th Edition - Clinical stage from 03/21/2019: Stage I (cT2, cN1, cM0, p16+) - Signed by [name], MD on 06/18/2019   SUMMARY OF ONCOLOGIC HISTORY: Oncology History  Breast cancer of upper-outer quadrant of left female breast ([place])  04/12/2016 Initial Diagnosis   Left breast biopsy: IDC grade 2-3, left axillary LN positive for metastatic IDC; mammogram revealed a 5 cm solid mass left breast 12:00 anterior depth, ER 0%, PR 0%, Ki-67 80%, HER-2 positive ratio 7.6, copy #16.5   05/10/2016 Imaging   Bone scan normal; CT CAP: left breast cancer, left axillary and subpectoral nodes are not pathologic by size criteriabut suspicious, no metastatic disease   05/16/2016 Breast MRI   Left breast solid with cystic appearance OUQ 1:00 crossing into the medial left breast 4.7 x 3.3 x 4.2 cm; second 81m nodule left breast UOQ: Posterior intramammary/low axillary lymph node, indeterminate left axillary lymph nodes, skin thickening   05/17/2016 - 08/29/2016 Neo-Adjuvant Chemotherapy   Taxol weekly 12 Herceptin and Perjeta every 3 weeks 6   09/05/2016 Breast MRI   Breast MRI: Complete Imaging response   10/18/2016 Surgery   Left modified radical mastectomy: Complete response to treatment 0/9 lymph nodes negative [id number]    03/12/2019 Relapse/Recurrence   Patient had dysphagia and pain in neck. CT neck showed a large mass at the base of the tongue compatible with carcinoma and a 9 mm right level 2 lymph node which shows increased enhancement and is suspicious for  metastatic disease. PET scan showed the large right tongue base mass consistent with neoplasm, a 474mpulmonary nodule that was indeterminate. No evidence of metastases in the chest, abdomen or pelvis.   Cancer of base of tongue ([place] 03/13/2019 Imaging   CT neck w/ contrast: IMPRESSION: Large mass lesion base of tongue on the right compatible with carcinoma.   9 mm right level 2 lymph node which shows increased enhancement and is suspicious for metastatic disease. Correlate with PET-CT.   03/19/2019 Imaging   PET: IMPRESSION: 1. Large right tongue base mass is markedly hypermetabolic and consistent with neoplasm. 2. No neck adenopathy or evidence of metastatic disease in the chest, abdomen or pelvis. 3. Single 4 mm nodule in the right middle lobe is indeterminate. Recommend follow-up noncontrast chest CT in 6-12 months. 4. Surgical changes from a left mastectomy. No chest wall mass or supraclavicular or axillary adenopathy.   03/21/2019 Cancer Staging   Staging form: Pharynx - HPV-Mediated Oropharynx, AJCC 8th Edition - Clinical stage from 03/21/2019: Stage I (cT2, cN1, cM0, p16+) - Signed by SqEppie GibsonMD on 06/18/2019   03/24/2019 Initial Diagnosis   Cancer of base of tongue ([place]  04/14/2019 - 06/02/2019 Radiation Therapy   04/14/2019 through 06/02/2019 (SIsidore Moos  Site Technique Total Dose Dose per Fx Completed Fx Beam Energies  Head & neck: HN_BOT IMRT 70/70 2 35/35 6X         CURRENT THERAPY: Observation  INTERVAL HISTORY: GuElmer Picker89.o. female returns for f/u of her h/o breast and BOT squamous  cell cancer.  She is doing well today.  She continues to f/u with her PCP regularly.  She is eating well and her dysphagia has improved.  She no longer wants to undergo clinical breast exams or annual mammograms.  She remains active and has no other concerns today.    Patient Active Problem List   Diagnosis Date Noted   Dysphonia 06/29/2020   Aortic atherosclerosis  ([place]) 05/27/2020   Hyponatremia 03/28/2019   Cancer of base of tongue ([place]) 03/24/2019   Oropharyngeal dysphagia 03/14/2019   Dysphasia 01/27/2019   Hypertension 12/13/2016   S/P mastectomy, left 10/18/2016   History of breast cancer 10/18/2016   Port catheter in place 05/23/2016   Breast cancer of upper-outer quadrant of left female breast ([place]) 04/30/2016   Conductive hearing loss in right ear 12/09/2015   Enteritis 05/30/2013   Prediabetes    Macular degeneration, dry    Vertigo    Vitamin D deficiency 11/17/2008   COLONIC POLYPS, HX OF 11/17/2008   Hypothyroidism 11/19/2007   HYPERLIPIDEMIA 11/19/2007   Osteopenia 02/18/2007    is allergic to other.  MEDICAL HISTORY: Past Medical History:  Diagnosis Date   Hx of colonic polyps    Hyperlipidemia    Hypothyroidism    Macular degeneration, dry    Osteopenia    spine BMD T score - 1.69   PONV (postoperative nausea and vomiting)    pt has vertigo and can not lie flat   Prediabetes    FBS 136  on 08/29/2010; A1c 6.2%   Vertigo     SURGICAL HISTORY: Past Surgical History:  Procedure Laterality Date   CATARACT EXTRACTION  2009   OS   COLONOSCOPY W/ POLYPECTOMY     X 2; last 2012. Dr Watt Climes   IR GASTROSTOMY TUBE MOD SED  04/17/2019   IR GASTROSTOMY TUBE REMOVAL  08/29/2019   IR GENERIC HISTORICAL  05/09/2016   IR US GUIDE VASC ACCESS RIGHT 05/09/2016 Jacqulynn Cadet, MD [facility]   IR GENERIC HISTORICAL  05/09/2016   IR FLUORO GUIDE CV LINE RIGHT 05/09/2016 Jacqulynn Cadet, MD [facility]   MODIFIED MASTECTOMY Left 10/18/2016   Procedure: LEFT MODIFIED RADICAL MASTECTOMY;  Surgeon: [name], MD;  Location: [facility];  Service: General;  Laterality: Left;   PILONIDAL CYST EXCISION     X 3   TONSILLECTOMY AND ADENOIDECTOMY     TOTAL ABDOMINAL HYSTERECTOMY     w/ bladder repair    SOCIAL HISTORY: Social History   Socioeconomic History   Marital status: Widowed    Spouse name: Not on  file   Number of children: 4   Years of education: Not on file   Highest education level: Not on file  Occupational History   Occupation: retired  Tobacco Use   Smoking status: Never   Smokeless tobacco: Never  Vaping Use   Vaping Use: Never used  Substance and Sexual Activity   Alcohol use: No    Comment:     Drug use: No   Sexual activity: Not on file  Other Topics Concern   Not on file  Social History Narrative   Regular exercise- yes 59md w/o symptoms   Social Determinants of Health   Financial Resource Strain: Low Risk  (12/29/2021)   Overall Financial Resource Strain (CARDIA)    Difficulty of Paying Living Expenses: Not hard at all  Food Insecurity: No Food Insecurity (12/29/2021)   Hunger Vital Sign    Worried About Running  Out of Food in the Last Year: Never true    Ran Out of Food in the Last Year: Never true  Transportation Needs: No Transportation Needs (12/29/2021)   PRAPARE - Hydrologist (Medical): No    Lack of Transportation (Non-Medical): No  Physical Activity: Sufficiently Active (12/29/2021)   Exercise Vital Sign    Days of Exercise per Week: 5 days    Minutes of Exercise per Session: 30 min  Stress: No Stress Concern Present (12/29/2021)   [place]    Feeling of Stress : Not at all  Social Connections: Moderately Integrated (12/29/2021)   Social Connection and Isolation Panel [NHANES]    Frequency of Communication with Friends and Family: More than three times a week    Frequency of Social Gatherings with Friends and Family: More than three times a week    Attends Religious Services: More than 4 times per year    Active Member of [employer] or Organizations: Yes    Attends Archivist Meetings: More than 4 times per year    Marital Status: Widowed  Intimate Partner Violence: Not At Risk (12/29/2021)   Humiliation, Afraid, Rape, and Kick questionnaire     Fear of Current or Ex-Partner: No    Emotionally Abused: No    Physically Abused: No    Sexually Abused: No    FAMILY HISTORY: Family History  Problem Relation Age of Onset   Osteoporosis Mother    Leukemia Mother    Cancer Maternal Uncle        ? stomach   Aneurysm Father        AAA   Heart attack Neg Hx     Review of Systems  Constitutional:  Negative for appetite change, chills, fatigue, fever and unexpected weight change.  HENT:   Negative for hearing loss, lump/mass and trouble swallowing.   Eyes:  Negative for eye problems and icterus.  Respiratory:  Negative for chest tightness, cough and shortness of breath.   Cardiovascular:  Negative for chest pain, leg swelling and palpitations.  Gastrointestinal:  Negative for abdominal distention, abdominal pain, constipation, diarrhea, nausea and vomiting.  Endocrine: Negative for hot flashes.  Genitourinary:  Negative for difficulty urinating.   Musculoskeletal:  Negative for arthralgias.  Skin:  Negative for itching and rash.  Neurological:  Negative for dizziness, extremity weakness, headaches and numbness.  Hematological:  Negative for adenopathy. Does not bruise/bleed easily.  Psychiatric/Behavioral:  Negative for depression. The patient is not nervous/anxious.       PHYSICAL EXAMINATION  ECOG PERFORMANCE STATUS: 0 - Asymptomatic  Vitals:   04/17/22 1055  BP: (!) 178/76  Pulse: 67  Resp: 18  Temp: 97.7 F (36.5 C)  SpO2: 99%    Physical Exam Constitutional:      General: She is not in acute distress.    Appearance: Normal appearance. She is not toxic-appearing.  HENT:     Head: Normocephalic and atraumatic.  Eyes:     General: No scleral icterus. Cardiovascular:     Rate and Rhythm: Normal rate and regular rhythm.     Pulses: Normal pulses.     Heart sounds: Normal heart sounds.  Pulmonary:     Effort: Pulmonary effort is normal.     Breath sounds: Normal breath sounds.  Chest:     Comments: Declined  breast exam Abdominal:     General: Abdomen is flat. Bowel sounds are normal. There  is no distension.     Palpations: Abdomen is soft.     Tenderness: There is no abdominal tenderness.  Musculoskeletal:        General: No swelling.     Cervical back: Neck supple.  Lymphadenopathy:     Cervical: No cervical adenopathy.  Skin:    General: Skin is warm and dry.     Findings: No rash.  Neurological:     General: No focal deficit present.     Mental Status: She is alert.  Psychiatric:        Mood and Affect: Mood normal.        Behavior: Behavior normal.     ASSESSMENT and THERAPY PLAN:   Cancer of base of tongue ([place]) She has no clinical signs of recurrence.  Her weight and swallowing are stable.  She will return on an as needed basis.    Breast cancer of upper-outer quadrant of left female breast [facility]) She has no clinical signs of breast cancer recurrence.  She has opted to forego routine breast cancer surveillance which is understandable considering her age.  We talked about her f/u and I recommended she continue to see her PCP regularly and we will see her on an as needed basis.  I gave her my card today so she can reach out to me if needed in the future.     All questions were answered. The patient knows to call the clinic with any problems, questions or concerns. We can certainly see the patient much sooner if necessary.  Total encounter time:20 minutes*in face-to-face visit time, chart review, lab review, care coordination, order entry, and documentation of the encounter time.    Wilber Bihari, NP 04/17/22 11:31 AM Medical Oncology and Hematology [facility] [place], [place] [address] Tel. [phone]    Fax. [phone]  *Total Encounter Time as defined by the Centers for Medicare and Medicaid Services includes, in addition to the face-to-face time of a patient visit (documented in the note above) non-face-to-face time: obtaining and  reviewing outside history, ordering and reviewing medications, tests or procedures, care coordination (communications with other health care professionals or caregivers) and documentation in the medical record.

## 2022-04-17 NOTE — Assessment & Plan Note (Signed)
She has no clinical signs of breast cancer recurrence.  She has opted to forego routine breast cancer surveillance which is understandable considering her age.  We talked about her f/u and I recommended she continue to see her PCP regularly and we will see her on an as needed basis.  I gave her my card today so she can reach out to me if needed in the future.

## 2022-05-25 ENCOUNTER — Telehealth: Payer: Self-pay | Admitting: Internal Medicine

## 2022-05-25 MED ORDER — ALPRAZOLAM 0.25 MG PO TABS: 0.2500 mg | ORAL_TABLET | Freq: Two times a day (BID) | ORAL | 0 refills | Status: DC | PRN

## 2022-05-25 NOTE — Telephone Encounter (Signed)
I can send in a very low-dose of Xanax for her to take twice daily as needed-most likely this will just calm her but not cause drowsiness, but she should use caution when she first takes it.  Would she want me to send it to [facility] [place] [facility] and most likely they will be able to deliver until tomorrow

## 2022-05-25 NOTE — Telephone Encounter (Signed)
Patient is requesting phone call from Dr. Quay Burow - states that her close female friend just passed away and her son was recently hospitalized and she thinks she is having some anxiety.   Next OV scheduled for 10/20.  Call back number is [phone]

## 2022-05-28 NOTE — Progress Notes (Unsigned)
    Subjective:    Patient ID: Jessica Sherman, female    DOB: December 10, 1930, 88 y.o.   MRN: [id number]      HPI Jaelle is here for No chief complaint on file.    Anxiety - her close female friend just recently passed away and her son was recently hospitalized and she has been anxious.  I prescribed her a low dose of xanax the end of last week     Medications and allergies reviewed with patient and updated if appropriate.  Current Outpatient Medications on File Prior to Visit  Medication Sig Dispense Refill   ALPRAZolam (XANAX) 0.25 MG tablet Take 1 tablet (0.25 mg total) by mouth 2 (two) times daily as needed for anxiety. 20 tablet 0   amLODipine (NORVASC) 5 MG tablet TAKE ONE TABLET BY MOUTH ONCE DAILY 90 tablet 3   Calcium Carb-Cholecalciferol (CALCIUM 600+D3 PO) Take 1 tablet by mouth 2 (two) times daily.     Cholecalciferol (VITAMIN D3) 1000 UNITS CAPS Take 1,000 Units by mouth daily.     levothyroxine (SYNTHROID) 75 MCG tablet TAKE ONE TABLET BY MOUTH ONCE DAILY 90 tablet 1   Multiple Vitamins-Minerals (PRESERVISION AREDS PO) Take by mouth in the morning and at bedtime.     simvastatin (ZOCOR) 20 MG tablet TAKE ONE TABLET BY MOUTH EVERYDAY AT BEDTIME 90 tablet 1   [DISCONTINUED] prochlorperazine (COMPAZINE) 10 MG tablet Take 1 tablet (10 mg total) by mouth every 6 (six) hours as needed (Nausea or vomiting). (Patient not taking: Reported on 07/27/2016) 30 tablet 1   No current facility-administered medications on file prior to visit.    Review of Systems     Objective:  There were no vitals filed for this visit. BP Readings from Last 3 Encounters:  04/17/22 (!) 178/76  12/28/21 132/70  11/04/21 (!) 177/68   Wt Readings from Last 3 Encounters:  04/17/22 99 lb 11.2 oz (45.2 kg)  12/29/21 99 lb (44.9 kg)  12/28/21 99 lb (44.9 kg)   There is no height or weight on file to calculate BMI.    Physical Exam         Assessment & Plan:    See Problem List for  Assessment and Plan of chronic medical problems.

## 2022-05-29 ENCOUNTER — Ambulatory Visit (INDEPENDENT_AMBULATORY_CARE_PROVIDER_SITE_OTHER): Payer: PPO | Admitting: Internal Medicine

## 2022-05-29 ENCOUNTER — Encounter: Payer: Self-pay | Admitting: Internal Medicine

## 2022-05-29 VITALS — BP 148/78 | HR 72 | Temp 97.9°F | Ht 64.0 in | Wt 98.8 lb

## 2022-05-29 DIAGNOSIS — Z23 Encounter for immunization: Secondary | ICD-10-CM

## 2022-05-29 DIAGNOSIS — F419 Anxiety disorder, unspecified: Secondary | ICD-10-CM

## 2022-05-29 DIAGNOSIS — I10 Essential (primary) hypertension: Secondary | ICD-10-CM | POA: Diagnosis not present

## 2022-05-29 NOTE — Assessment & Plan Note (Signed)
New She does have a history of anxiety, but this has not been an issue until recently when her son was sick and in the hospital for a month and her companion died She denies any depression, but does feel anxious She will start taking alprazolam 0.25 mg twice daily as needed-discussed possible side effects.  I think she will tolerate this well given that she was on lorazepam in the past and I expect this to be better tolerated She will call if she has any questions or concerns She will come back for her routine visit in 1 month, sooner if needed.

## 2022-05-29 NOTE — Assessment & Plan Note (Signed)
Chronic Blood pressure typically controlled, but today it is elevated and still elevated on repeat She has a routine visit next month so we will not make any changes and reevaluate at that time Continue amlodipine 5 mg daily

## 2022-05-29 NOTE — Patient Instructions (Addendum)
       Medications changes include :  take the alprazolam as needed     Return for follow up as scheduled.

## 2022-06-02 ENCOUNTER — Telehealth: Payer: Self-pay

## 2022-06-02 NOTE — Telephone Encounter (Signed)
Patient called wanting to know what she should do about her worsening issues with swallowing ("I'm scared to swallow and feel like I get choked easily"). Asked patient if she had seen her ENT provider Dr. Redmond Baseman recently. She stated she was scheduled to see him in October, but that was for earwax removal. Offered to call Dr. Redmond Baseman' office on patient's behalf to see if he could see patient sooner to assess symptoms and offer recommendations. Patient verbalized agreement and appreciation.   Spoke with scheduler at Dr. Redmond Baseman' office who stated Dr. Redmond Baseman had an opening next Tuesday 9/26 at 9:00am. She confirmed that the October appointment for wax removal would be kept, but it would now by with a PA since Dr. Redmond Baseman would be seeing patient next week.   Called patient and left detailed message on both patient's home and cell phone with above information. Provided my direct call back number should patient have any other questions or concerns.

## 2022-06-06 DIAGNOSIS — Z85810 Personal history of malignant neoplasm of tongue: Secondary | ICD-10-CM | POA: Insufficient documentation

## 2022-06-06 DIAGNOSIS — R1314 Dysphagia, pharyngoesophageal phase: Secondary | ICD-10-CM | POA: Diagnosis not present

## 2022-06-06 DIAGNOSIS — H6123 Impacted cerumen, bilateral: Secondary | ICD-10-CM | POA: Diagnosis not present

## 2022-06-12 ENCOUNTER — Other Ambulatory Visit: Payer: Self-pay | Admitting: Otolaryngology

## 2022-06-12 DIAGNOSIS — R1314 Dysphagia, pharyngoesophageal phase: Secondary | ICD-10-CM

## 2022-06-14 ENCOUNTER — Ambulatory Visit
Admission: RE | Admit: 2022-06-14 | Discharge: 2022-06-14 | Disposition: A | Discharge status: Home / Self Care | Payer: PPO | Source: Ambulatory Visit | Attending: Otolaryngology | Admitting: Otolaryngology

## 2022-06-14 DIAGNOSIS — R1310 Dysphagia, unspecified: Secondary | ICD-10-CM | POA: Diagnosis not present

## 2022-06-14 DIAGNOSIS — R1314 Dysphagia, pharyngoesophageal phase: Secondary | ICD-10-CM

## 2022-06-22 ENCOUNTER — Other Ambulatory Visit: Payer: Self-pay | Admitting: Internal Medicine

## 2022-06-29 ENCOUNTER — Encounter: Payer: Self-pay | Admitting: Internal Medicine

## 2022-06-29 NOTE — Patient Instructions (Addendum)
      Blood work was ordered.   The lab is on the first floor.    Medications changes include :   valsartan 40 mg daily for your BP      Return in about 4 weeks (around 07/28/2022) for hypertension.

## 2022-06-29 NOTE — Progress Notes (Signed)
Subjective:    Patient ID: Jessica Sherman, female    DOB: Aug 15, 1931, 88 y.o.   MRN: [id number]     HPI Jessica Sherman is here for follow up of her chronic medical problems, including htn, hld, hypothyroid, prediabetes, h/o breast ca, h/o tongue ca  She is taking all of her medications as prescribed.    Medications and allergies reviewed with patient and updated if appropriate.  Current Outpatient Medications on File Prior to Visit  Medication Sig Dispense Refill   ALPRAZolam (XANAX) 0.25 MG tablet TAKE ONE TABLET BY MOUTH TWICE DAILY AS NEEDED FOR anxiety 20 tablet 2   amLODipine (NORVASC) 5 MG tablet TAKE ONE TABLET BY MOUTH ONCE DAILY 90 tablet 3   Calcium Carb-Cholecalciferol (CALCIUM 600+D3 PO) Take 1 tablet by mouth 2 (two) times daily.     Cholecalciferol (VITAMIN D3) 1000 UNITS CAPS Take 1,000 Units by mouth daily.     levothyroxine (SYNTHROID) 75 MCG tablet TAKE ONE TABLET BY MOUTH ONCE DAILY 90 tablet 1   Multiple Vitamins-Minerals (PRESERVISION AREDS PO) Take by mouth in the morning and at bedtime.     simvastatin (ZOCOR) 20 MG tablet TAKE ONE TABLET BY MOUTH EVERYDAY AT BEDTIME 90 tablet 1   [DISCONTINUED] prochlorperazine (COMPAZINE) 10 MG tablet Take 1 tablet (10 mg total) by mouth every 6 (six) hours as needed (Nausea or vomiting). (Patient not taking: Reported on 07/27/2016) 30 tablet 1   No current facility-administered medications on file prior to visit.     Review of Systems  Constitutional:  Negative for fever.  Respiratory:  Negative for cough, shortness of breath and wheezing.   Cardiovascular:  Negative for chest pain, palpitations and leg swelling.  Neurological:  Negative for light-headedness and headaches.       Objective:   Vitals:   06/30/22 1356  BP: (!) 160/60  Pulse: 65  Temp: 98.2 F (36.8 C)  SpO2: 96%   BP Readings from Last 3 Encounters:  06/30/22 (!) 160/60  05/29/22 (!) 148/78  04/17/22 (!) 178/76   Wt Readings from Last 3  Encounters:  06/30/22 100 lb (45.4 kg)  05/29/22 98 lb 12.8 oz (44.8 kg)  04/17/22 99 lb 11.2 oz (45.2 kg)   Body mass index is 17.16 kg/m.    Physical Exam Constitutional:      General: She is not in acute distress.    Appearance: Normal appearance.  HENT:     Head: Normocephalic and atraumatic.  Eyes:     Conjunctiva/sclera: Conjunctivae normal.  Cardiovascular:     Rate and Rhythm: Normal rate and regular rhythm.     Heart sounds: Normal heart sounds. No murmur heard. Pulmonary:     Effort: Pulmonary effort is normal. No respiratory distress.     Breath sounds: Normal breath sounds. No wheezing.  Musculoskeletal:     Cervical back: Neck supple.     Right lower leg: No edema.     Left lower leg: No edema.  Lymphadenopathy:     Cervical: No cervical adenopathy.  Skin:    General: Skin is warm and dry.     Findings: No rash.  Neurological:     Mental Status: She is alert. Mental status is at baseline.  Psychiatric:        Mood and Affect: Mood normal.        Behavior: Behavior normal.        Lab Results  Component Value Date   WBC 5.9 12/28/2021  HGB 11.6 (L) 12/28/2021   HCT 33.1 (L) 12/28/2021   PLT 253.0 12/28/2021   GLUCOSE 129 (H) 12/28/2021   CHOL 133 12/28/2021   TRIG 90.0 12/28/2021   HDL 66.70 12/28/2021   LDLDIRECT 57.0 01/27/2019   LDLCALC 49 12/28/2021   ALT 17 12/28/2021   AST 21 12/28/2021   NA 134 (L) 12/28/2021   K 4.4 12/28/2021   CL 96 12/28/2021   CREATININE 0.71 12/28/2021   BUN 15 12/28/2021   CO2 29 12/28/2021   TSH 1.23 12/28/2021   INR 0.9 04/17/2019   HGBA1C 6.2 12/28/2021     Assessment & Plan:    See Problem List for Assessment and Plan of chronic medical problems.

## 2022-06-30 ENCOUNTER — Ambulatory Visit (INDEPENDENT_AMBULATORY_CARE_PROVIDER_SITE_OTHER): Payer: PPO | Admitting: Internal Medicine

## 2022-06-30 VITALS — BP 120/80 | HR 65 | Temp 98.2°F | Ht 64.0 in | Wt 100.0 lb

## 2022-06-30 DIAGNOSIS — E782 Mixed hyperlipidemia: Secondary | ICD-10-CM

## 2022-06-30 DIAGNOSIS — D649 Anemia, unspecified: Secondary | ICD-10-CM | POA: Diagnosis not present

## 2022-06-30 DIAGNOSIS — E871 Hypo-osmolality and hyponatremia: Secondary | ICD-10-CM

## 2022-06-30 DIAGNOSIS — R7303 Prediabetes: Secondary | ICD-10-CM | POA: Diagnosis not present

## 2022-06-30 DIAGNOSIS — R1312 Dysphagia, oropharyngeal phase: Secondary | ICD-10-CM | POA: Diagnosis not present

## 2022-06-30 DIAGNOSIS — F419 Anxiety disorder, unspecified: Secondary | ICD-10-CM | POA: Diagnosis not present

## 2022-06-30 DIAGNOSIS — I10 Essential (primary) hypertension: Secondary | ICD-10-CM

## 2022-06-30 DIAGNOSIS — E039 Hypothyroidism, unspecified: Secondary | ICD-10-CM

## 2022-06-30 LAB — IBC PANEL
Iron: 88 ug/dL (ref 42–145)
Saturation Ratios: 21.3 % (ref 20.0–50.0)
TIBC: 413 ug/dL (ref 250.0–450.0)
Transferrin: 295 mg/dL (ref 212.0–360.0)

## 2022-06-30 LAB — CBC WITH DIFFERENTIAL/PLATELET
Basophils Absolute: 0 10*3/uL (ref 0.0–0.1)
Basophils Relative: 0.7 % (ref 0.0–3.0)
Eosinophils Absolute: 0.1 10*3/uL (ref 0.0–0.7)
Eosinophils Relative: 1.5 % (ref 0.0–5.0)
HCT: 33.3 % — ABNORMAL LOW (ref 36.0–46.0)
Hemoglobin: 11.3 g/dL — ABNORMAL LOW (ref 12.0–15.0)
Lymphocytes Relative: 16.4 % (ref 12.0–46.0)
Lymphs Abs: 1 10*3/uL (ref 0.7–4.0)
MCHC: 34 g/dL (ref 30.0–36.0)
MCV: 100.2 fl — ABNORMAL HIGH (ref 78.0–100.0)
Monocytes Absolute: 0.7 10*3/uL (ref 0.1–1.0)
Monocytes Relative: 11.1 % (ref 3.0–12.0)
Neutro Abs: 4.4 10*3/uL (ref 1.4–7.7)
Neutrophils Relative %: 70.3 % (ref 43.0–77.0)
Platelets: 259 10*3/uL (ref 150.0–400.0)
RBC: 3.32 Mil/uL — ABNORMAL LOW (ref 3.87–5.11)
RDW: 12.8 % (ref 11.5–15.5)
WBC: 6.2 10*3/uL (ref 4.0–10.5)

## 2022-06-30 LAB — LIPID PANEL
Cholesterol: 147 mg/dL (ref 0–200)
HDL: 74 mg/dL (ref >39.00)
LDL Cholesterol: 54 mg/dL (ref 0–99)
NonHDL: 73.24
Total CHOL/HDL Ratio: 2
Triglycerides: 95 mg/dL (ref 0.0–149.0)
VLDL: 19 mg/dL (ref 0.0–40.0)

## 2022-06-30 LAB — COMPREHENSIVE METABOLIC PANEL
ALT: 21 U/L (ref 0–35)
AST: 23 U/L (ref 0–37)
Albumin: 4.3 g/dL (ref 3.5–5.2)
Alkaline Phosphatase: 57 U/L (ref 39–117)
BUN: 13 mg/dL (ref 6–23)
CO2: 29 mEq/L (ref 19–32)
Calcium: 9.5 mg/dL (ref 8.4–10.5)
Chloride: 92 mEq/L — ABNORMAL LOW (ref 96–112)
Creatinine, Ser: 0.71 mg/dL (ref 0.40–1.20)
GFR: 75.19 mL/min (ref >60.00)
Glucose, Bld: 108 mg/dL — ABNORMAL HIGH (ref 70–99)
Potassium: 4.2 mEq/L (ref 3.5–5.1)
Sodium: 128 mEq/L — ABNORMAL LOW (ref 135–145)
Total Bilirubin: 0.3 mg/dL (ref 0.2–1.2)
Total Protein: 7.1 g/dL (ref 6.0–8.3)

## 2022-06-30 LAB — FERRITIN: Ferritin: 79.6 ng/mL (ref 10.0–291.0)

## 2022-06-30 LAB — HEMOGLOBIN A1C: Hgb A1c MFr Bld: 6.2 % (ref 4.6–6.5)

## 2022-06-30 LAB — TSH: TSH: 0.51 u[IU]/mL (ref 0.35–5.50)

## 2022-06-30 LAB — VITAMIN B12: Vitamin B-12: 437 pg/mL (ref 211–911)

## 2022-06-30 MED ORDER — VALSARTAN 40 MG PO TABS: 40.0000 mg | ORAL_TABLET | Freq: Every day | ORAL | 5 refills | Status: DC

## 2022-06-30 NOTE — Assessment & Plan Note (Signed)
Chronic °Regular exercise and healthy diet encouraged °Check lipid panel  °Continue simvastatin 20 mg daily °

## 2022-06-30 NOTE — Assessment & Plan Note (Addendum)
Chronic BP not well controlled Add valsartan 40 mg dialy Continue amlodipine 5 mg daily  Stressed monitoring BP at home cmp

## 2022-06-30 NOTE — Assessment & Plan Note (Signed)
Chronic Check a1c Low sugar / carb diet Stressed regular exercise  

## 2022-06-30 NOTE — Assessment & Plan Note (Addendum)
Nutritional deficiency CBC, iron panel, B12

## 2022-06-30 NOTE — Assessment & Plan Note (Signed)
Chronic  Clinically euthyroid Check tsh and will titrate med dose if needed Currently taking levothyroxine 75 mcg daily  

## 2022-06-30 NOTE — Assessment & Plan Note (Addendum)
Has had increased anxiety recently Recently started on alprazolam 0.25 mg twice daily as needed - has not taken any of it Feels she is doing good

## 2022-06-30 NOTE — Assessment & Plan Note (Signed)
Chronic Has an EGD scheduled-likely mild esophageal stricture

## 2022-07-02 NOTE — Addendum Note (Signed)
Addended by: [name] on: 07/02/2022 03:49 PM   Modules accepted: Orders

## 2022-07-12 ENCOUNTER — Other Ambulatory Visit (INDEPENDENT_AMBULATORY_CARE_PROVIDER_SITE_OTHER): Payer: PPO

## 2022-07-12 DIAGNOSIS — D649 Anemia, unspecified: Secondary | ICD-10-CM

## 2022-07-12 DIAGNOSIS — E871 Hypo-osmolality and hyponatremia: Secondary | ICD-10-CM | POA: Diagnosis not present

## 2022-07-12 LAB — BASIC METABOLIC PANEL
BUN: 15 mg/dL (ref 6–23)
CO2: 33 mEq/L — ABNORMAL HIGH (ref 19–32)
Calcium: 9.7 mg/dL (ref 8.4–10.5)
Chloride: 93 mEq/L — ABNORMAL LOW (ref 96–112)
Creatinine, Ser: 0.75 mg/dL (ref 0.40–1.20)
GFR: 70.39 mL/min (ref >60.00)
Glucose, Bld: 143 mg/dL — ABNORMAL HIGH (ref 70–99)
Potassium: 4.4 mEq/L (ref 3.5–5.1)
Sodium: 130 mEq/L — ABNORMAL LOW (ref 135–145)

## 2022-07-12 LAB — CBC WITH DIFFERENTIAL/PLATELET
Basophils Absolute: 0 10*3/uL (ref 0.0–0.1)
Basophils Relative: 0.5 % (ref 0.0–3.0)
Eosinophils Absolute: 0.1 10*3/uL (ref 0.0–0.7)
Eosinophils Relative: 0.9 % (ref 0.0–5.0)
HCT: 34.6 % — ABNORMAL LOW (ref 36.0–46.0)
Hemoglobin: 11.7 g/dL — ABNORMAL LOW (ref 12.0–15.0)
Lymphocytes Relative: 13.8 % (ref 12.0–46.0)
Lymphs Abs: 1 10*3/uL (ref 0.7–4.0)
MCHC: 33.7 g/dL (ref 30.0–36.0)
MCV: 96.4 fl (ref 78.0–100.0)
Monocytes Absolute: 0.8 10*3/uL (ref 0.1–1.0)
Monocytes Relative: 11.5 % (ref 3.0–12.0)
Neutro Abs: 5.1 10*3/uL (ref 1.4–7.7)
Neutrophils Relative %: 73.3 % (ref 43.0–77.0)
Platelets: 268 10*3/uL (ref 150.0–400.0)
RBC: 3.59 Mil/uL — ABNORMAL LOW (ref 3.87–5.11)
RDW: 12.9 % (ref 11.5–15.5)
WBC: 6.9 10*3/uL (ref 4.0–10.5)

## 2022-07-13 NOTE — Addendum Note (Signed)
Addended by: Marcina Millard on: 07/13/2022 04:46 PM   Modules accepted: Orders

## 2022-07-16 ENCOUNTER — Other Ambulatory Visit: Payer: Self-pay | Admitting: Internal Medicine

## 2022-07-25 NOTE — Progress Notes (Unsigned)
      Subjective:    Patient ID: Jessica Sherman, female    DOB: 01-Mar-1931, 88 y.o.   MRN: [id number]     HPI Jessica Sherman is here for follow up of her chronic medical problems, including htn   About 3 weeks ago started her on valsartan 40 mg daily for her elevated BP.   Medications and allergies reviewed with patient and updated if appropriate.  Current Outpatient Medications on File Prior to Visit  Medication Sig Dispense Refill   ALPRAZolam (XANAX) 0.25 MG tablet TAKE ONE TABLET BY MOUTH TWICE DAILY AS NEEDED FOR anxiety 20 tablet 2   amLODipine (NORVASC) 5 MG tablet TAKE ONE TABLET BY MOUTH ONCE DAILY 90 tablet 3   Calcium Carb-Cholecalciferol (CALCIUM 600+D3 PO) Take 1 tablet by mouth 2 (two) times daily.     Cholecalciferol (VITAMIN D3) 1000 UNITS CAPS Take 1,000 Units by mouth daily.     levothyroxine (SYNTHROID) 75 MCG tablet TAKE ONE TABLET BY MOUTH ONCE DAILY 90 tablet 2   Multiple Vitamins-Minerals (PRESERVISION AREDS PO) Take by mouth in the morning and at bedtime.     simvastatin (ZOCOR) 20 MG tablet TAKE ONE TABLET BY MOUTH EVERYDAY AT BEDTIME 90 tablet 1   valsartan (DIOVAN) 40 MG tablet Take 1 tablet (40 mg total) by mouth daily. 30 tablet 5   [DISCONTINUED] prochlorperazine (COMPAZINE) 10 MG tablet Take 1 tablet (10 mg total) by mouth every 6 (six) hours as needed (Nausea or vomiting). (Patient not taking: Reported on 07/27/2016) 30 tablet 1   No current facility-administered medications on file prior to visit.     Review of Systems     Objective:  There were no vitals filed for this visit. BP Readings from Last 3 Encounters:  06/30/22 120/80  05/29/22 (!) 148/78  04/17/22 (!) 178/76   Wt Readings from Last 3 Encounters:  06/30/22 100 lb (45.4 kg)  05/29/22 98 lb 12.8 oz (44.8 kg)  04/17/22 99 lb 11.2 oz (45.2 kg)   There is no height or weight on file to calculate BMI.    Physical Exam     Lab Results  Component Value Date   WBC 6.9  07/12/2022   HGB 11.7 (L) 07/12/2022   HCT 34.6 (L) 07/12/2022   PLT 268.0 07/12/2022   GLUCOSE 143 (H) 07/12/2022   CHOL 147 06/30/2022   TRIG 95.0 06/30/2022   HDL 74.00 06/30/2022   LDLDIRECT 57.0 01/27/2019   LDLCALC 54 06/30/2022   ALT 21 06/30/2022   AST 23 06/30/2022   NA 130 (L) 07/12/2022   K 4.4 07/12/2022   CL 93 (L) 07/12/2022   CREATININE 0.75 07/12/2022   BUN 15 07/12/2022   CO2 33 (H) 07/12/2022   TSH 0.51 06/30/2022   INR 0.9 04/17/2019   HGBA1C 6.2 06/30/2022     Assessment & Plan:    See Problem List for Assessment and Plan of chronic medical problems.

## 2022-07-26 ENCOUNTER — Encounter: Payer: Self-pay | Admitting: Internal Medicine

## 2022-07-26 ENCOUNTER — Ambulatory Visit (INDEPENDENT_AMBULATORY_CARE_PROVIDER_SITE_OTHER): Payer: PPO | Admitting: Internal Medicine

## 2022-07-26 VITALS — BP 132/60 | HR 65 | Temp 98.0°F | Ht 64.0 in | Wt 99.0 lb

## 2022-07-26 DIAGNOSIS — I10 Essential (primary) hypertension: Secondary | ICD-10-CM

## 2022-07-26 LAB — BASIC METABOLIC PANEL
BUN: 14 mg/dL (ref 6–23)
CO2: 31 mEq/L (ref 19–32)
Calcium: 9.5 mg/dL (ref 8.4–10.5)
Chloride: 94 mEq/L — ABNORMAL LOW (ref 96–112)
Creatinine, Ser: 0.76 mg/dL (ref 0.40–1.20)
GFR: 69.26 mL/min (ref >60.00)
Glucose, Bld: 101 mg/dL — ABNORMAL HIGH (ref 70–99)
Potassium: 4.5 mEq/L (ref 3.5–5.1)
Sodium: 130 mEq/L — ABNORMAL LOW (ref 135–145)

## 2022-07-26 NOTE — Assessment & Plan Note (Signed)
Chronic Blood pressure well controlled BMP Continue amlodipine 5 mg daily, valsartan 40 mg daily

## 2022-07-26 NOTE — Patient Instructions (Addendum)
      Blood work was ordered.   The lab is on the first floor.    Medications changes include :   none     Return in about 6 months (around 01/24/2023) for Physical Exam.

## 2022-07-27 ENCOUNTER — Telehealth: Payer: Self-pay | Admitting: Internal Medicine

## 2022-07-27 NOTE — Telephone Encounter (Signed)
Patient called back about message below:   Marcina Millard, [facility] 07/27/2022  2:31 PM EST Back to Top    Message left for patient to return call to clinic for results.   [name], MD 07/26/2022  1:02 PM EST     Kidney function and potassium are normal.   Made her aware of Dr Quay Burow message and she didn't have any further questions

## 2022-07-28 ENCOUNTER — Ambulatory Visit: Payer: PPO | Admitting: Internal Medicine

## 2022-09-27 DIAGNOSIS — H353133 Nonexudative age-related macular degeneration, bilateral, advanced atrophic without subfoveal involvement: Secondary | ICD-10-CM | POA: Diagnosis not present

## 2022-09-27 DIAGNOSIS — H5203 Hypermetropia, bilateral: Secondary | ICD-10-CM | POA: Diagnosis not present

## 2022-10-06 DIAGNOSIS — Z961 Presence of intraocular lens: Secondary | ICD-10-CM | POA: Diagnosis not present

## 2022-10-06 DIAGNOSIS — H353221 Exudative age-related macular degeneration, left eye, with active choroidal neovascularization: Secondary | ICD-10-CM | POA: Diagnosis not present

## 2022-10-06 DIAGNOSIS — H353112 Nonexudative age-related macular degeneration, right eye, intermediate dry stage: Secondary | ICD-10-CM | POA: Diagnosis not present

## 2022-10-06 DIAGNOSIS — H35453 Secondary pigmentary degeneration, bilateral: Secondary | ICD-10-CM | POA: Diagnosis not present

## 2022-10-06 DIAGNOSIS — Z85810 Personal history of malignant neoplasm of tongue: Secondary | ICD-10-CM | POA: Diagnosis not present

## 2022-10-06 DIAGNOSIS — H43811 Vitreous degeneration, right eye: Secondary | ICD-10-CM | POA: Diagnosis not present

## 2022-10-06 DIAGNOSIS — Z853 Personal history of malignant neoplasm of breast: Secondary | ICD-10-CM | POA: Diagnosis not present

## 2022-10-06 DIAGNOSIS — H43391 Other vitreous opacities, right eye: Secondary | ICD-10-CM | POA: Diagnosis not present

## 2022-10-06 DIAGNOSIS — H35363 Drusen (degenerative) of macula, bilateral: Secondary | ICD-10-CM | POA: Diagnosis not present

## 2022-10-09 DIAGNOSIS — H353221 Exudative age-related macular degeneration, left eye, with active choroidal neovascularization: Secondary | ICD-10-CM | POA: Diagnosis not present

## 2022-10-11 ENCOUNTER — Other Ambulatory Visit: Payer: Self-pay | Admitting: Internal Medicine

## 2022-11-13 DIAGNOSIS — H353221 Exudative age-related macular degeneration, left eye, with active choroidal neovascularization: Secondary | ICD-10-CM | POA: Diagnosis not present

## 2022-11-17 ENCOUNTER — Other Ambulatory Visit: Payer: Self-pay | Admitting: Internal Medicine

## 2022-11-17 DIAGNOSIS — I10 Essential (primary) hypertension: Secondary | ICD-10-CM

## 2022-12-12 ENCOUNTER — Other Ambulatory Visit: Payer: Self-pay | Admitting: Internal Medicine

## 2022-12-18 DIAGNOSIS — H353221 Exudative age-related macular degeneration, left eye, with active choroidal neovascularization: Secondary | ICD-10-CM | POA: Diagnosis not present

## 2022-12-26 ENCOUNTER — Telehealth: Payer: Self-pay

## 2022-12-26 NOTE — Telephone Encounter (Signed)
Called patient to schedule Medicare Annual Wellness Visit (AWV). Left message for patient to call back and schedule Medicare Annual Wellness Visit (AWV).  Last date of AWV: 12/29/21  Please schedule an appointment at any time with [facility] on Annual Wellness Schedule.   Agnes Lawrence, CMA (AAMA)  [facility]- AWV Program

## 2023-01-17 ENCOUNTER — Other Ambulatory Visit: Payer: Self-pay | Admitting: Internal Medicine

## 2023-01-17 DIAGNOSIS — I10 Essential (primary) hypertension: Secondary | ICD-10-CM

## 2023-01-22 DIAGNOSIS — H353221 Exudative age-related macular degeneration, left eye, with active choroidal neovascularization: Secondary | ICD-10-CM | POA: Diagnosis not present

## 2023-01-30 ENCOUNTER — Encounter: Payer: Self-pay | Admitting: Internal Medicine

## 2023-01-30 NOTE — Progress Notes (Unsigned)
Subjective:    Patient ID: Jessica Sherman, female    DOB: September 02, 1931, 89 y.o.   MRN: [id number]      HPI Jessica Sherman is here for a Physical exam and her chronic medical problems.   Gained 2 lbs - has been trying  to gain weight --no concerns,   Medications and allergies reviewed with patient and updated if appropriate.  Current Outpatient Medications on File Prior to Visit  Medication Sig Dispense Refill   Calcium Carb-Cholecalciferol (CALCIUM 600+D3 PO) Take 1 tablet by mouth 2 (two) times daily.     Cholecalciferol (VITAMIN D3) 1000 UNITS CAPS Take 1,000 Units by mouth daily.     levothyroxine (SYNTHROID) 75 MCG tablet TAKE ONE TABLET BY MOUTH ONCE DAILY 90 tablet 2   Multiple Vitamins-Minerals (PRESERVISION AREDS PO) Take by mouth in the morning and at bedtime.     simvastatin (ZOCOR) 20 MG tablet TAKE ONE TABLET BY MOUTH EVERYDAY AT BEDTIME 90 tablet 1   valsartan (DIOVAN) 40 MG tablet TAKE ONE TABLET BY MOUTH ONCE DAILY 30 tablet 5   [DISCONTINUED] prochlorperazine (COMPAZINE) 10 MG tablet Take 1 tablet (10 mg total) by mouth every 6 (six) hours as needed (Nausea or vomiting). (Patient not taking: Reported on 07/27/2016) 30 tablet 1   No current facility-administered medications on file prior to visit.    Review of Systems  Constitutional:  Negative for fever.  Eyes:  Negative for visual disturbance.  Respiratory:  Negative for cough, shortness of breath and wheezing.   Cardiovascular:  Negative for chest pain, palpitations and leg swelling.  Gastrointestinal:  Negative for abdominal pain, blood in stool, constipation and diarrhea.       No gerd  Genitourinary:  Negative for dysuria.  Musculoskeletal:  Positive for arthralgias. Negative for back pain.  Skin:  Negative for rash.  Neurological:  Positive for light-headedness (occ at night). Negative for headaches.  Psychiatric/Behavioral:  Negative for dysphoric mood. The patient is not nervous/anxious.         Objective:   Vitals:   01/31/23 1004  BP: 118/68  Pulse: 67  Temp: 98 F (36.7 C)  SpO2: 98%   Filed Weights   01/31/23 1004  Weight: 101 lb (45.8 kg)   Body mass index is 17.34 kg/m.  BP Readings from Last 3 Encounters:  01/31/23 118/68  07/26/22 132/60  06/30/22 120/80    Wt Readings from Last 3 Encounters:  01/31/23 101 lb (45.8 kg)  07/26/22 99 lb (44.9 kg)  06/30/22 100 lb (45.4 kg)       Physical Exam Constitutional: She appears well-developed and well-nourished. No distress.  HENT:  Head: Normocephalic and atraumatic.  Right Ear: External ear normal. Normal ear canal and TM Left Ear: External ear normal.  Normal ear canal and TM Mouth/Throat: Oropharynx is clear and moist.  Eyes: Conjunctivae normal.  Neck: Neck supple. No tracheal deviation present. No thyromegaly present.  No carotid bruit  Cardiovascular: Normal rate, regular rhythm and normal heart sounds.   No murmur heard.  No edema. Pulmonary/Chest: Effort normal and breath sounds normal. No respiratory distress. She has no wheezes. She has no rales.  Breast: deferred   Abdominal: Soft. She exhibits no distension. There is no tenderness.  Lymphadenopathy: She has no cervical adenopathy.  Skin: Skin is warm and dry. She is not diaphoretic.  Psychiatric: She has a normal mood and affect. Her behavior is normal.     Lab Results  Component Value Date  WBC 6.9 07/12/2022   HGB 11.7 (L) 07/12/2022   HCT 34.6 (L) 07/12/2022   PLT 268.0 07/12/2022   GLUCOSE 101 (H) 07/26/2022   CHOL 147 06/30/2022   TRIG 95.0 06/30/2022   HDL 74.00 06/30/2022   LDLDIRECT 57.0 01/27/2019   LDLCALC 54 06/30/2022   ALT 21 06/30/2022   AST 23 06/30/2022   NA 130 (L) 07/26/2022   K 4.5 07/26/2022   CL 94 (L) 07/26/2022   CREATININE 0.76 07/26/2022   BUN 14 07/26/2022   CO2 31 07/26/2022   TSH 0.51 06/30/2022   INR 0.9 04/17/2019   HGBA1C 6.2 06/30/2022         Assessment & Plan:   Physical  exam: Screening blood work  ordered Exercise  very active Weight  low - has gained 2 lbs - working on weight gain Substance abuse  none   Reviewed recommended immunizations.   Health Maintenance  Topic Date Due   DTaP/Tdap/Td (2 - Tdap) 01/08/2020   Medicare Annual Wellness (AWV)  12/30/2022   COVID-19 Vaccine (4 - 2023-24 season) 02/16/2023 (Originally 05/12/2022)   DEXA SCAN  06/22/2054 (Originally 04/18/2019)   INFLUENZA VACCINE  04/12/2023   Pneumonia Vaccine 67+ Years old  Completed   Zoster Vaccines- Shingrix  Completed   HPV VACCINES  Aged Out          See Problem List for Assessment and Plan of chronic medical problems.

## 2023-01-30 NOTE — Patient Instructions (Addendum)
Blood work was ordered.   The lab is on the first floor.    Medications changes include :   None      Return in about 6 months (around 08/03/2023) for follow up.   Health Maintenance, Female Adopting a healthy lifestyle and getting preventive care are important in promoting health and wellness. Ask your health care provider about: The right schedule for you to have regular tests and exams. Things you can do on your own to prevent diseases and keep yourself healthy. What should I know about diet, weight, and exercise? Eat a healthy diet  Eat a diet that includes plenty of vegetables, fruits, low-fat dairy products, and lean protein. Do not eat a lot of foods that are high in solid fats, added sugars, or sodium. Maintain a healthy weight Body mass index (BMI) is used to identify weight problems. It estimates body fat based on height and weight. Your health care provider can help determine your BMI and help you achieve or maintain a healthy weight. Get regular exercise Get regular exercise. This is one of the most important things you can do for your health. Most adults should: Exercise for at least 150 minutes each week. The exercise should increase your heart rate and make you sweat (moderate-intensity exercise). Do strengthening exercises at least twice a week. This is in addition to the moderate-intensity exercise. Spend less time sitting. Even light physical activity can be beneficial. Watch cholesterol and blood lipids Have your blood tested for lipids and cholesterol at 87 years of age, then have this test every 5 years. Have your cholesterol levels checked more often if: Your lipid or cholesterol levels are high. You are older than 87 years of age. You are at high risk for heart disease. What should I know about cancer screening? Depending on your health history and family history, you may need to have cancer screening at various ages. This may include screening  for: Breast cancer. Cervical cancer. Colorectal cancer. Skin cancer. Lung cancer. What should I know about heart disease, diabetes, and high blood pressure? Blood pressure and heart disease High blood pressure causes heart disease and increases the risk of stroke. This is more likely to develop in people who have high blood pressure readings or are overweight. Have your blood pressure checked: Every 3-5 years if you are 72-78 years of age. Every year if you are 67 years old or older. Diabetes Have regular diabetes screenings. This checks your fasting blood sugar level. Have the screening done: Once every three years after age 51 if you are at a normal weight and have a low risk for diabetes. More often and at a younger age if you are overweight or have a high risk for diabetes. What should I know about preventing infection? Hepatitis B If you have a higher risk for hepatitis B, you should be screened for this virus. Talk with your health care provider to find out if you are at risk for hepatitis B infection. Hepatitis C Testing is recommended for: Everyone born from [birth year] through 1963. Anyone with known risk factors for hepatitis C. Sexually transmitted infections (STIs) Get screened for STIs, including gonorrhea and chlamydia, if: You are sexually active and are younger than 87 years of age. You are older than 87 years of age and your health care provider tells you that you are at risk for this type of infection. Your sexual activity has changed since you were last screened, and you are  at increased risk for chlamydia or gonorrhea. Ask your health care provider if you are at risk. Ask your health care provider about whether you are at high risk for HIV. Your health care provider may recommend a prescription medicine to help prevent HIV infection. If you choose to take medicine to prevent HIV, you should first get tested for HIV. You should then be tested every 3 months for as long as you  are taking the medicine. Pregnancy If you are about to stop having your period (premenopausal) and you may become pregnant, seek counseling before you get pregnant. Take 400 to 800 micrograms (mcg) of folic acid every day if you become pregnant. Ask for birth control (contraception) if you want to prevent pregnancy. Osteoporosis and menopause Osteoporosis is a disease in which the bones lose minerals and strength with aging. This can result in bone fractures. If you are 37 years old or older, or if you are at risk for osteoporosis and fractures, ask your health care provider if you should: Be screened for bone loss. Take a calcium or vitamin D supplement to lower your risk of fractures. Be given hormone replacement therapy (HRT) to treat symptoms of menopause. Follow these instructions at home: Alcohol use Do not drink alcohol if: Your health care provider tells you not to drink. You are pregnant, may be pregnant, or are planning to become pregnant. If you drink alcohol: Limit how much you have to: 0-1 drink a day. Know how much alcohol is in your drink. In the [place] one drink equals one 12 oz bottle of beer (355 mL), one 5 oz glass of wine (148 mL), or one 1 oz glass of hard liquor (44 mL). Lifestyle Do not use any products that contain nicotine or tobacco. These products include cigarettes, chewing tobacco, and vaping devices, such as e-cigarettes. If you need help quitting, ask your health care provider. Do not use street drugs. Do not share needles. Ask your health care provider for help if you need support or information about quitting drugs. General instructions Schedule regular health, dental, and eye exams. Stay current with your vaccines. Tell your health care provider if: You often feel depressed. You have ever been abused or do not feel safe at home. Summary Adopting a healthy lifestyle and getting preventive care are important in promoting health and wellness. Follow your  health care provider's instructions about healthy diet, exercising, and getting tested or screened for diseases. Follow your health care provider's instructions on monitoring your cholesterol and blood pressure. This information is not intended to replace advice given to you by your health care provider. Make sure you discuss any questions you have with your health care provider. Document Revised: 01/17/2021 Document Reviewed: 01/17/2021 Elsevier Patient Education  2023 [employer].

## 2023-01-31 ENCOUNTER — Ambulatory Visit (INDEPENDENT_AMBULATORY_CARE_PROVIDER_SITE_OTHER): Payer: PPO | Admitting: Internal Medicine

## 2023-01-31 VITALS — BP 118/68 | HR 67 | Temp 98.0°F | Ht 64.0 in | Wt 101.0 lb

## 2023-01-31 DIAGNOSIS — E039 Hypothyroidism, unspecified: Secondary | ICD-10-CM | POA: Diagnosis not present

## 2023-01-31 DIAGNOSIS — E559 Vitamin D deficiency, unspecified: Secondary | ICD-10-CM | POA: Diagnosis not present

## 2023-01-31 DIAGNOSIS — E782 Mixed hyperlipidemia: Secondary | ICD-10-CM

## 2023-01-31 DIAGNOSIS — I10 Essential (primary) hypertension: Secondary | ICD-10-CM

## 2023-01-31 DIAGNOSIS — I700 Atherosclerosis of aorta: Secondary | ICD-10-CM | POA: Diagnosis not present

## 2023-01-31 DIAGNOSIS — Z0000 Encounter for general adult medical examination without abnormal findings: Secondary | ICD-10-CM

## 2023-01-31 DIAGNOSIS — R7303 Prediabetes: Secondary | ICD-10-CM | POA: Diagnosis not present

## 2023-01-31 LAB — TSH: TSH: 0.95 u[IU]/mL (ref 0.35–5.50)

## 2023-01-31 LAB — LIPID PANEL
Cholesterol: 156 mg/dL (ref 0–200)
HDL: 80.2 mg/dL (ref >39.00)
LDL Cholesterol: 64 mg/dL (ref 0–99)
NonHDL: 75.66
Total CHOL/HDL Ratio: 2
Triglycerides: 56 mg/dL (ref 0.0–149.0)
VLDL: 11.2 mg/dL (ref 0.0–40.0)

## 2023-01-31 LAB — COMPREHENSIVE METABOLIC PANEL
ALT: 17 U/L (ref 0–35)
AST: 23 U/L (ref 0–37)
Albumin: 4.2 g/dL (ref 3.5–5.2)
Alkaline Phosphatase: 52 U/L (ref 39–117)
BUN: 17 mg/dL (ref 6–23)
CO2: 31 mEq/L (ref 19–32)
Calcium: 9.7 mg/dL (ref 8.4–10.5)
Chloride: 94 mEq/L — ABNORMAL LOW (ref 96–112)
Creatinine, Ser: 0.8 mg/dL (ref 0.40–1.20)
GFR: 64.89 mL/min (ref >60.00)
Glucose, Bld: 100 mg/dL — ABNORMAL HIGH (ref 70–99)
Potassium: 4.7 mEq/L (ref 3.5–5.1)
Sodium: 131 mEq/L — ABNORMAL LOW (ref 135–145)
Total Bilirubin: 0.5 mg/dL (ref 0.2–1.2)
Total Protein: 7.2 g/dL (ref 6.0–8.3)

## 2023-01-31 LAB — CBC WITH DIFFERENTIAL/PLATELET
Basophils Absolute: 0 10*3/uL (ref 0.0–0.1)
Basophils Relative: 0.7 % (ref 0.0–3.0)
Eosinophils Absolute: 0.1 10*3/uL (ref 0.0–0.7)
Eosinophils Relative: 1.6 % (ref 0.0–5.0)
HCT: 37.3 % (ref 36.0–46.0)
Hemoglobin: 12.2 g/dL (ref 12.0–15.0)
Lymphocytes Relative: 14.4 % (ref 12.0–46.0)
Lymphs Abs: 0.9 10*3/uL (ref 0.7–4.0)
MCHC: 32.7 g/dL (ref 30.0–36.0)
MCV: 98.4 fl (ref 78.0–100.0)
Monocytes Absolute: 0.7 10*3/uL (ref 0.1–1.0)
Monocytes Relative: 11.9 % (ref 3.0–12.0)
Neutro Abs: 4.5 10*3/uL (ref 1.4–7.7)
Neutrophils Relative %: 71.4 % (ref 43.0–77.0)
Platelets: 268 10*3/uL (ref 150.0–400.0)
RBC: 3.79 Mil/uL — ABNORMAL LOW (ref 3.87–5.11)
RDW: 12.4 % (ref 11.5–15.5)
WBC: 6.2 10*3/uL (ref 4.0–10.5)

## 2023-01-31 LAB — HEMOGLOBIN A1C: Hgb A1c MFr Bld: 6 % (ref 4.6–6.5)

## 2023-01-31 LAB — VITAMIN D 25 HYDROXY (VIT D DEFICIENCY, FRACTURES): VITD: 47.33 ng/mL (ref 30.00–100.00)

## 2023-01-31 MED ORDER — AMLODIPINE BESYLATE 2.5 MG PO TABS
2.5000 mg | ORAL_TABLET | Freq: Every day | ORAL | 3 refills | Status: DC
  Filled 2023-05-10 – 2023-05-24 (×2): qty 90, 90d supply, fill #0

## 2023-01-31 NOTE — Addendum Note (Signed)
Addended by: Pincus Sanes on: 01/31/2023 11:18 AM   Modules accepted: Level of Service

## 2023-01-31 NOTE — Assessment & Plan Note (Addendum)
Chronic Blood pressure well controlled CBC, CMP Continue valsartan 40 mg daily Has some lightheadedness at night and given age--  decrease amlodipine 2.5 mg daily

## 2023-01-31 NOTE — Assessment & Plan Note (Signed)
Chronic ?Continue simvastatin 20 mg daily ?Encouraged healthy diet, regular exercise ?

## 2023-01-31 NOTE — Assessment & Plan Note (Signed)
Chronic Check a1c Low sugar / carb diet Stressed regular exercise  

## 2023-01-31 NOTE — Assessment & Plan Note (Signed)
Chronic °Regular exercise and healthy diet encouraged °Check lipid panel  °Continue simvastatin 20 mg daily °

## 2023-01-31 NOTE — Assessment & Plan Note (Signed)
Chronic  Clinically euthyroid Check tsh and will titrate med dose if needed Currently taking levothyroxine 75 mcg daily 

## 2023-01-31 NOTE — Assessment & Plan Note (Signed)
Chronic Taking vitamin D daily Check vitamin D level  

## 2023-02-26 DIAGNOSIS — H353221 Exudative age-related macular degeneration, left eye, with active choroidal neovascularization: Secondary | ICD-10-CM | POA: Diagnosis not present

## 2023-04-02 DIAGNOSIS — H353221 Exudative age-related macular degeneration, left eye, with active choroidal neovascularization: Secondary | ICD-10-CM | POA: Diagnosis not present

## 2023-04-10 ENCOUNTER — Other Ambulatory Visit: Payer: Self-pay | Admitting: Internal Medicine

## 2023-04-17 DIAGNOSIS — H903 Sensorineural hearing loss, bilateral: Secondary | ICD-10-CM | POA: Diagnosis not present

## 2023-04-19 ENCOUNTER — Ambulatory Visit (INDEPENDENT_AMBULATORY_CARE_PROVIDER_SITE_OTHER): Payer: PPO

## 2023-04-19 VITALS — Ht 63.0 in | Wt 101.0 lb

## 2023-04-19 DIAGNOSIS — Z0000 Encounter for general adult medical examination without abnormal findings: Secondary | ICD-10-CM | POA: Diagnosis not present

## 2023-04-19 NOTE — Progress Notes (Signed)
Subjective:   Jessica Sherman is a 89 y.o. female who presents for Medicare Annual (Subsequent) preventive examination.  Visit Complete: Virtual  I connected with  Jessica Sherman on 04/19/23 by a audio enabled telemedicine application and verified that I am speaking with the correct person using two identifiers.  Patient Location: Home  Provider Location: Office/Clinic  I discussed the limitations of evaluation and management by telemedicine. The patient expressed understanding and agreed to proceed.  Vital Signs: Vital signs are patient reported.   Review of Systems    Cardiac Risk Factors include: advanced age (>47men, >48 women);hypertension;Other (see comment), Risk factor comments: ostoepenia, Aortic atherosclerosis     Objective:    Today's Vitals   04/19/23 1305  Weight: 101 lb (45.8 kg)  Height: 5\' 3"  (1.6 m)   Body mass index is 17.89 kg/m.     04/19/2023    1:10 PM 12/29/2021    2:35 PM 11/04/2021    2:33 PM 05/23/2021   10:33 AM 12/10/2020    2:56 PM 10/29/2020    2:29 PM 03/26/2020    2:21 PM  Advanced Directives  Does Patient Have a Medical Advance Directive? Yes Yes Yes Yes Yes Yes Yes  Type of Estate agent of [place];Living will Healthcare Power of [place];Living will Healthcare Power of [place];Living will Healthcare Power of [place];Living will Living will;Healthcare Power of [employer] Power of [place];Living will Healthcare Power of [place];Living will  Does patient want to make changes to medical advance directive? No - Patient declined No - Patient declined No - Patient declined  No - Patient declined No - Patient declined No - Patient declined  Copy of Healthcare Power of Attorney in Chart? Yes - validated most recent copy scanned in chart (See row information) Yes - validated most recent copy scanned in chart (See row information) Yes - validated most recent copy scanned in chart (See row information) Yes -  validated most recent copy scanned in chart (See row information) No - copy requested Yes - validated most recent copy scanned in chart (See row information) Yes - validated most recent copy scanned in chart (See row information)  Would patient like information on creating a medical advance directive?       No - Patient declined    Current Medications (verified) Outpatient Encounter Medications as of 04/19/2023  Medication Sig   amLODipine (NORVASC) 2.5 MG tablet Take 1 tablet (2.5 mg total) by mouth daily.   Calcium Carb-Cholecalciferol (CALCIUM 600+D3 PO) Take 1 tablet by mouth 2 (two) times daily.   Cholecalciferol (VITAMIN D3) 1000 UNITS CAPS Take 1,000 Units by mouth daily.   levothyroxine (SYNTHROID) 75 MCG tablet TAKE ONE TABLET BY MOUTH ONCE DAILY   Multiple Vitamins-Minerals (PRESERVISION AREDS PO) Take by mouth in the morning and at bedtime.   simvastatin (ZOCOR) 20 MG tablet TAKE ONE TABLET BY MOUTH EVERYDAY AT BEDTIME   valsartan (DIOVAN) 40 MG tablet TAKE ONE TABLET BY MOUTH ONCE DAILY   [DISCONTINUED] prochlorperazine (COMPAZINE) 10 MG tablet Take 1 tablet (10 mg total) by mouth every 6 (six) hours as needed (Nausea or vomiting). (Patient not taking: Reported on 07/27/2016)   No facility-administered encounter medications on file as of 04/19/2023.    Allergies (verified) Other   History: Past Medical History:  Diagnosis Date   Hx of colonic polyps    Hyperlipidemia    Hypothyroidism    Macular degeneration, dry    Osteopenia    spine BMD T  score - 1.69   PONV (postoperative nausea and vomiting)    pt has vertigo and can not lie flat   Prediabetes    FBS 136  on 08/29/2010; A1c 6.2%   Vertigo    Past Surgical History:  Procedure Laterality Date   CATARACT EXTRACTION  2009   OS   COLONOSCOPY W/ POLYPECTOMY     X 2; last 2012. Dr Ewing Schlein   IR GASTROSTOMY TUBE MOD SED  04/17/2019   IR GASTROSTOMY TUBE REMOVAL  08/29/2019   IR GENERIC HISTORICAL  05/09/2016   IR US GUIDE  VASC ACCESS RIGHT 05/09/2016 Malachy Moan, MD [facility]   IR GENERIC HISTORICAL  05/09/2016   IR FLUORO GUIDE CV LINE RIGHT 05/09/2016 Malachy Moan, MD [facility]   MODIFIED MASTECTOMY Left 10/18/2016   Procedure: LEFT MODIFIED RADICAL MASTECTOMY;  Surgeon: Abigail Miyamoto, MD;  Location: [facility];  Service: General;  Laterality: Left;   PILONIDAL CYST EXCISION     X 3   TONSILLECTOMY AND ADENOIDECTOMY     TOTAL ABDOMINAL HYSTERECTOMY     w/ bladder repair   Family History  Problem Relation Age of Onset   Osteoporosis Mother    Leukemia Mother    Cancer Maternal Uncle        ? stomach   Aneurysm Father        AAA   Heart attack Neg Hx    Social History   Socioeconomic History   Marital status: Widowed    Spouse name: Not on file   Number of children: 4   Years of education: Not on file   Highest education level: Not on file  Occupational History   Occupation: retired  Tobacco Use   Smoking status: Never   Smokeless tobacco: Never  Vaping Use   Vaping status: Never Used  Substance and Sexual Activity   Alcohol use: Yes    Comment: occasional glass of wine or beer   Drug use: No   Sexual activity: Not on file  Other Topics Concern   Not on file  Social History Narrative   Regular exercise- yes w/o symptoms   Social Determinants of Health   Financial Resource Strain: Low Risk  (04/19/2023)   Overall Financial Resource Strain (CARDIA)    Difficulty of Paying Living Expenses: Not hard at all  Food Insecurity: No Food Insecurity (04/19/2023)   Hunger Vital Sign    Worried About Running Out of Food in the Last Year: Never true    Ran Out of Food in the Last Year: Never true  Transportation Needs: No Transportation Needs (04/19/2023)   PRAPARE - Administrator, Civil Service (Medical): No    Lack of Transportation (Non-Medical): No  Physical Activity: Unknown (04/19/2023)   Exercise Vital Sign    Days of Exercise per Week: 7  days    Minutes of Exercise per Session: Not on file  Stress: No Stress Concern Present (04/19/2023)   [employer] of Occupational Health - Occupational Stress Questionnaire    Feeling of Stress : Only a little  Social Connections: Moderately Integrated (04/19/2023)   Social Connection and Isolation Panel [NHANES]    Frequency of Communication with Friends and Family: More than three times a week    Frequency of Social Gatherings with Friends and Family: More than three times a week    Attends Religious Services: More than 4 times per year    Active Member of [employer] or Organizations: Yes  Attends Banker Meetings: More than 4 times per year    Marital Status: Widowed    Tobacco Counseling Counseling given: Not Answered   Clinical Intake:  Pre-visit preparation completed: Yes  Pain : No/denies pain     BMI - recorded: 17.89 Nutritional Risks: None Diabetes: No     Interpreter Needed?: No  Information entered by ::  , RMA   Activities of Daily Living    04/19/2023    1:07 PM  In your present state of health, do you have any difficulty performing the following activities:  Hearing? 1  Vision? 0  Difficulty concentrating or making decisions? 0  Dressing or bathing? 0  Doing errands, shopping? 0  Preparing Food and eating ? N  Using the Toilet? N    Patient Care Team: Pincus Sanes, MD as PCP - General (Internal Medicine) Serena Croissant, MD as Consulting Physician (Hematology and Oncology) Bensimhon, Bevelyn Buckles, MD as Consulting Physician (Cardiology) Abigail Miyamoto, MD as Consulting Physician (General Surgery) Lonie Peak, MD as Attending Physician (Radiation Oncology) Barron Alvine, CCC-SLP as Speech Language Pathologist (Speech Pathology) Anabel Bene, RD as Dietitian (Nutrition) Antony Contras, MD as Consulting Physician (Ophthalmology) Christia Reading, MD as Consulting Physician (Otolaryngology) Szabat, Vinnie Level, [facility]  (Inactive) as Pharmacist (Pharmacist)  Indicate any recent Medical Services you may have received from other than [facility] providers in the past year (date may be approximate).     Assessment:   This is a routine wellness examination for Gregoria.  Hearing/Vision screen Hearing Screening - Comments:: Hearing aides Vision Screening - Comments:: Wears eyeglasses  Dietary issues and exercise activities discussed:     Goals Addressed   None   Depression Screen    04/19/2023    1:15 PM 06/30/2022    1:58 PM 12/29/2021    2:50 PM 12/28/2021    2:08 PM 12/10/2020    3:13 PM 01/27/2019    1:51 PM 12/13/2016    9:56 AM  PHQ 2/9 Scores  PHQ - 2 Score 0 0 0 0 0 0 0  PHQ- 9 Score 0 0         Fall Risk    04/19/2023    1:10 PM 06/30/2022    1:57 PM 05/29/2022    1:53 PM 05/29/2022    1:24 PM 12/29/2021    2:52 PM  Fall Risk   Falls in the past year? 0 0 0 0 0  Number falls in past yr: 0 0 0 0 0  Injury with Fall? 0 0 0 0 0  Risk for fall due to : No Fall Risks  No Fall Risks No Fall Risks No Fall Risks  Follow up Falls prevention discussed Falls evaluation completed Falls evaluation completed Falls evaluation completed Falls evaluation completed    MEDICARE RISK AT HOME:  Medicare Risk at Home - 04/19/23 1311     Any stairs in or around the home? Yes    If so, are there any without handrails? Yes    Home free of loose throw rugs in walkways, pet beds, electrical cords, etc? Yes    Adequate lighting in your home to reduce risk of falls? Yes    Life alert? No    Use of a cane, walker or w/c? No    Grab bars in the bathroom? No    Shower chair or bench in shower? Yes    Elevated toilet seat or a handicapped toilet? Yes  TIMED UP AND GO:  Was the test performed?  No    Cognitive Function:        04/19/2023    1:11 PM 12/29/2021    2:56 PM  6CIT Screen  What Year? 0 points 0 points  What month? 0 points 0 points  What time? 0 points 0 points  Count back from 20 0  points 0 points  Months in reverse 0 points 0 points  Repeat phrase 2 points 0 points  Total Score 2 points 0 points    Immunizations Immunization History  Administered Date(s) Administered   Fluad Quad(high Dose 65+) 05/23/2019, 05/27/2020, 06/24/2021, 05/29/2022   Influenza Split 06/21/2011   Influenza Whole 09/12/1999, 07/06/2005, 06/18/2008, 06/22/2010   Influenza, High Dose Seasonal PF 07/08/2013, 06/09/2017, 06/01/2018   Influenza,inj,Quad PF,6+ Mos 05/23/2016   Influenza-Unspecified 11/20/2014, 06/12/2015, 06/09/2017, 06/01/2018   PFIZER(Purple Top)SARS-COV-2 Vaccination 10/16/2019, 11/10/2019, 06/09/2020   Pneumococcal Conjugate-13 10/08/2015   Pneumococcal Polysaccharide-23 07/17/2002, 07/31/2013   Td 01/07/2010   Zoster Recombinant(Shingrix) 05/02/2018, 07/01/2018    TDAP status: Due, Education has been provided regarding the importance of this vaccine. Advised may receive this vaccine at local pharmacy or Health Dept. Aware to provide a copy of the vaccination record if obtained from local pharmacy or Health Dept. Verbalized acceptance and understanding.  Flu Vaccine status: Up to date  Pneumococcal vaccine status: Up to date  Covid-19 vaccine status: Information provided on how to obtain vaccines.   Qualifies for Shingles Vaccine? Yes   Zostavax completed Yes   Shingrix Completed?: Yes  Screening Tests Health Maintenance  Topic Date Due   DTaP/Tdap/Td (2 - Tdap) 01/08/2020   COVID-19 Vaccine (4 - 2023-24 season) 05/12/2022   INFLUENZA VACCINE  04/12/2023   DEXA SCAN  06/22/2054 (Originally 04/18/2019)   Medicare Annual Wellness (AWV)  04/18/2024   Pneumonia Vaccine 28+ Years old  Completed   Zoster Vaccines- Shingrix  Completed   HPV VACCINES  Aged Out    Health Maintenance  Health Maintenance Due  Topic Date Due   DTaP/Tdap/Td (2 - Tdap) 01/08/2020   COVID-19 Vaccine (4 - 2023-24 season) 05/12/2022   INFLUENZA VACCINE  04/12/2023    Colorectal cancer  screening: No longer required.   Mammogram status: No longer required due to age.   Lung Cancer Screening: (Low Dose CT Chest recommended if Age 20-80 years, 20 pack-year currently smoking OR have quit w/in 15years.) does not qualify.   Lung Cancer Screening Referral: N/A  Additional Screening:  Hepatitis C Screening: does not qualify;   Vision Screening: Recommended annual ophthalmology exams for early detection of glaucoma and other disorders of the eye. Is the patient up to date with their annual eye exam?  Yes  Who is the provider or what is the name of the office in which the patient attends annual eye exams? Dr. Randon Goldsmith, Dr. Allena Katz If pt is not established with a provider, would they like to be referred to a provider to establish care? Yes .   Dental Screening: Recommended annual dental exams for proper oral hygiene   Community Resource Referral / Chronic Care Management: CRR required this visit?  No   CCM required this visit?  No     Plan:     I have personally reviewed and noted the following in the patient's chart:   Medical and social history Use of alcohol, tobacco or illicit drugs  Current medications and supplements including opioid prescriptions. Patient is not currently taking opioid prescriptions. Functional ability and  status Nutritional status Physical activity Advanced directives List of other physicians Hospitalizations, surgeries, and ER visits in previous 12 months Vitals Screenings to include cognitive, depression, and falls Referrals and appointments  In addition, I have reviewed and discussed with patient certain preventive protocols, quality metrics, and best practice recommendations. A written personalized care plan for preventive services as well as general preventive health recommendations were provided to patient.      L , CMA   04/19/2023   After Visit Summary: (MyChart) Due to this being a telephonic visit, the after visit  summary with patients personalized plan was offered to patient via MyChart   Nurse Notes: Patient is due for her Tdap, and Covid vaccine.  She will get these at her local pharmacy.  Patient declines any more DEXA screening and Mammogram screenings. She has no other concerns today.

## 2023-04-19 NOTE — Patient Instructions (Addendum)
Jessica Sherman , Thank you for taking time to come for your Medicare Wellness Visit. I appreciate your ongoing commitment to your health goals. Please review the following plan we discussed and let me know if I can assist you in the future.   Referrals/Orders/Follow-Ups/Clinician Recommendations: Remember to get your Tetanus and Covid vaccines separately at your local pharmacy.  It is also time to get your flu vaccine as well.  It was a pleasure speaking with you today and keep up the good work.   This is a list of the screening recommended for you and due dates:  Health Maintenance  Topic Date Due   DTaP/Tdap/Td vaccine (2 - Tdap) 01/08/2020   COVID-19 Vaccine (4 - 2023-24 season) 05/12/2022   Flu Shot  04/12/2023   DEXA scan (bone density measurement)  06/22/2054*   Medicare Annual Wellness Visit  04/18/2024   Pneumonia Vaccine  Completed   Zoster (Shingles) Vaccine  Completed   HPV Vaccine  Aged Out  *Topic was postponed. The date shown is not the original due date.    Advanced directives: (ACP Link)Information on Advanced Care Planning can be found at [facility] Advance Health Care Directives Advance Health Care Directives ([email or web address]   Next Medicare Annual Wellness Visit scheduled for next year: No  Preventive Care 65 Years and Older, Female Preventive care refers to lifestyle choices and visits with your health care provider that can promote health and wellness. What does preventive care include? A yearly physical exam. This is also called an annual well check. Dental exams once or twice a year. Routine eye exams. Ask your health care provider how often you should have your eyes checked. Personal lifestyle choices, including: Daily care of your teeth and gums. Regular physical activity. Eating a healthy diet. Avoiding tobacco and drug use. Limiting alcohol use. Practicing safe sex. Taking low-dose aspirin every day. Taking vitamin and mineral  supplements as recommended by your health care provider. What happens during an annual well check? The services and screenings done by your health care provider during your annual well check will depend on your age, overall health, lifestyle risk factors, and family history of disease. Counseling  Your health care provider may ask you questions about your: Alcohol use. Tobacco use. Drug use. Emotional well-being. Home and relationship well-being. Sexual activity. Eating habits. History of falls. Memory and ability to understand (cognition). Work and work Astronomer. Reproductive health. Screening  You may have the following tests or measurements: Height, weight, and BMI. Blood pressure. Lipid and cholesterol levels. These may be checked every 5 years, or more frequently if you are over 80 years old. Skin check. Lung cancer screening. You may have this screening every year starting at age 18 if you have a 30-pack-year history of smoking and currently smoke or have quit within the past 15 years. Fecal occult blood test (FOBT) of the stool. You may have this test every year starting at age 68. Flexible sigmoidoscopy or colonoscopy. You may have a sigmoidoscopy every 5 years or a colonoscopy every 10 years starting at age 12. Hepatitis C blood test. Hepatitis B blood test. Sexually transmitted disease (STD) testing. Diabetes screening. This is done by checking your blood sugar (glucose) after you have not eaten for a while (fasting). You may have this done every 1-3 years. Bone density scan. This is done to screen for osteoporosis. You may have this done starting at age 54. Mammogram. This may be done every 1-2 years. Talk to  your health care provider about how often you should have regular mammograms. Talk with your health care provider about your test results, treatment options, and if necessary, the need for more tests. Vaccines  Your health care provider may recommend certain  vaccines, such as: Influenza vaccine. This is recommended every year. Tetanus, diphtheria, and acellular pertussis (Tdap, Td) vaccine. You may need a Td booster every 10 years. Zoster vaccine. You may need this after age 63. Pneumococcal 13-valent conjugate (PCV13) vaccine. One dose is recommended after age 80. Pneumococcal polysaccharide (PPSV23) vaccine. One dose is recommended after age 37. Talk to your health care provider about which screenings and vaccines you need and how often you need them. This information is not intended to replace advice given to you by your health care provider. Make sure you discuss any questions you have with your health care provider. Document Released: 09/24/2015 Document Revised: 05/17/2016 Document Reviewed: 06/29/2015 Elsevier Interactive Patient Education  2017 [employer].  Fall Prevention in the Home Falls can cause injuries. They can happen to people of all ages. There are many things you can do to make your home safe and to help prevent falls. What can I do on the outside of my home? Regularly fix the edges of walkways and driveways and fix any cracks. Remove anything that might make you trip as you walk through a door, such as a raised step or threshold. Trim any bushes or trees on the path to your home. Use bright outdoor lighting. Clear any walking paths of anything that might make someone trip, such as rocks or tools. Regularly check to see if handrails are loose or broken. Make sure that both sides of any steps have handrails. Any raised decks and porches should have guardrails on the edges. Have any leaves, snow, or ice cleared regularly. Use sand or salt on walking paths during winter. Clean up any spills in your garage right away. This includes oil or grease spills. What can I do in the bathroom? Use night lights. Install grab bars by the toilet and in the tub and shower. Do not use towel bars as grab bars. Use non-skid mats or decals in  the tub or shower. If you need to sit down in the shower, use a plastic, non-slip stool. Keep the floor dry. Clean up any water that spills on the floor as soon as it happens. Remove soap buildup in the tub or shower regularly. Attach bath mats securely with double-sided non-slip rug tape. Do not have throw rugs and other things on the floor that can make you trip. What can I do in the bedroom? Use night lights. Make sure that you have a light by your bed that is easy to reach. Do not use any sheets or blankets that are too big for your bed. They should not hang down onto the floor. Have a firm chair that has side arms. You can use this for support while you get dressed. Do not have throw rugs and other things on the floor that can make you trip. What can I do in the kitchen? Clean up any spills right away. Avoid walking on wet floors. Keep items that you use a lot in easy-to-reach places. If you need to reach something above you, use a strong step stool that has a grab bar. Keep electrical cords out of the way. Do not use floor polish or wax that makes floors slippery. If you must use wax, use non-skid floor wax. Do not  have throw rugs and other things on the floor that can make you trip. What can I do with my stairs? Do not leave any items on the stairs. Make sure that there are handrails on both sides of the stairs and use them. Fix handrails that are broken or loose. Make sure that handrails are as long as the stairways. Check any carpeting to make sure that it is firmly attached to the stairs. Fix any carpet that is loose or worn. Avoid having throw rugs at the top or bottom of the stairs. If you do have throw rugs, attach them to the floor with carpet tape. Make sure that you have a light switch at the top of the stairs and the bottom of the stairs. If you do not have them, ask someone to add them for you. What else can I do to help prevent falls? Wear shoes that: Do not have high  heels. Have rubber bottoms. Are comfortable and fit you well. Are closed at the toe. Do not wear sandals. If you use a stepladder: Make sure that it is fully opened. Do not climb a closed stepladder. Make sure that both sides of the stepladder are locked into place. Ask someone to hold it for you, if possible. Clearly mark and make sure that you can see: Any grab bars or handrails. First and last steps. Where the edge of each step is. Use tools that help you move around (mobility aids) if they are needed. These include: Canes. Walkers. Scooters. Crutches. Turn on the lights when you go into a dark area. Replace any light bulbs as soon as they burn out. Set up your furniture so you have a clear path. Avoid moving your furniture around. If any of your floors are uneven, fix them. If there are any pets around you, be aware of where they are. Review your medicines with your doctor. Some medicines can make you feel dizzy. This can increase your chance of falling. Ask your doctor what other things that you can do to help prevent falls. This information is not intended to replace advice given to you by your health care provider. Make sure you discuss any questions you have with your health care provider. Document Released: 06/24/2009 Document Revised: 02/03/2016 Document Reviewed: 10/02/2014 Elsevier Interactive Patient Education  2017 [employer].

## 2023-05-10 ENCOUNTER — Other Ambulatory Visit (HOSPITAL_COMMUNITY): Payer: Self-pay

## 2023-05-10 MED FILL — Levothyroxine Sodium Tab 75 MCG: ORAL | 90 days supply | Qty: 90 | Fill #0 | Status: CN

## 2023-05-10 MED FILL — Valsartan Tab 40 MG: ORAL | 30 days supply | Qty: 30 | Fill #0 | Status: CN

## 2023-05-10 MED FILL — Simvastatin Tab 20 MG: ORAL | 90 days supply | Qty: 90 | Fill #0 | Status: CN

## 2023-05-21 DIAGNOSIS — H353221 Exudative age-related macular degeneration, left eye, with active choroidal neovascularization: Secondary | ICD-10-CM | POA: Diagnosis not present

## 2023-05-24 ENCOUNTER — Other Ambulatory Visit (HOSPITAL_COMMUNITY): Payer: Self-pay

## 2023-05-24 ENCOUNTER — Other Ambulatory Visit: Payer: Self-pay

## 2023-05-24 DIAGNOSIS — H903 Sensorineural hearing loss, bilateral: Secondary | ICD-10-CM | POA: Diagnosis not present

## 2023-05-24 MED FILL — Levothyroxine Sodium Tab 75 MCG: ORAL | 90 days supply | Qty: 90 | Fill #0 | Status: AC

## 2023-05-24 MED FILL — Simvastatin Tab 20 MG: ORAL | 90 days supply | Qty: 90 | Fill #0 | Status: AC

## 2023-05-24 MED FILL — Valsartan Tab 40 MG: ORAL | 30 days supply | Qty: 30 | Fill #0 | Status: AC

## 2023-06-29 ENCOUNTER — Other Ambulatory Visit (HOSPITAL_COMMUNITY): Payer: Self-pay

## 2023-06-29 ENCOUNTER — Other Ambulatory Visit: Payer: Self-pay | Admitting: Internal Medicine

## 2023-07-02 ENCOUNTER — Other Ambulatory Visit (HOSPITAL_COMMUNITY): Payer: Self-pay

## 2023-07-02 ENCOUNTER — Other Ambulatory Visit: Payer: Self-pay

## 2023-07-02 MED ORDER — VALSARTAN 40 MG PO TABS
40.0000 mg | ORAL_TABLET | Freq: Every day | ORAL | 5 refills | Status: DC
  Filled 2023-07-02: qty 30, 30d supply, fill #0

## 2023-07-03 ENCOUNTER — Other Ambulatory Visit (HOSPITAL_COMMUNITY): Payer: Self-pay

## 2023-07-03 NOTE — Patient Instructions (Addendum)
     Medications changes include :   cough syrup, augmentin and eye drops     Return if symptoms worsen or fail to improve.  

## 2023-07-03 NOTE — Progress Notes (Unsigned)
Subjective:    Patient ID: Jessica Sherman, female    DOB: 17-Jul-1931, 89 y.o.   MRN: [id number]      HPI Jessica Sherman is here for No chief complaint on file.   She is here for an acute visit for cold symptoms.   Her symptoms started   She is experiencing   She has tried taking       Medications and allergies reviewed with patient and updated if appropriate.  Current Outpatient Medications on File Prior to Visit  Medication Sig Dispense Refill   amLODipine (NORVASC) 2.5 MG tablet Take 1 tablet (2.5 mg total) by mouth daily. 90 tablet 3   Calcium Carb-Cholecalciferol (CALCIUM 600+D3 PO) Take 1 tablet by mouth 2 (two) times daily.     Cholecalciferol (VITAMIN D3) 1000 UNITS CAPS Take 1,000 Units by mouth daily.     levothyroxine (SYNTHROID) 75 MCG tablet Take 1 tablet (75 mcg total) by mouth daily. 90 tablet 2   Multiple Vitamins-Minerals (PRESERVISION AREDS PO) Take by mouth in the morning and at bedtime.     simvastatin (ZOCOR) 20 MG tablet Take 1 tablet (20 mg total) by mouth at bedtime. 90 tablet 2   valsartan (DIOVAN) 40 MG tablet Take 1 tablet (40 mg total) by mouth daily. 30 tablet 5   [DISCONTINUED] prochlorperazine (COMPAZINE) 10 MG tablet Take 1 tablet (10 mg total) by mouth every 6 (six) hours as needed (Nausea or vomiting). (Patient not taking: Reported on 07/27/2016) 30 tablet 1   No current facility-administered medications on file prior to visit.    Review of Systems     Objective:  There were no vitals filed for this visit. BP Readings from Last 3 Encounters:  01/31/23 118/68  07/26/22 132/60  06/30/22 120/80   Wt Readings from Last 3 Encounters:  04/19/23 101 lb (45.8 kg)  01/31/23 101 lb (45.8 kg)  07/26/22 99 lb (44.9 kg)   There is no height or weight on file to calculate BMI.    Physical Exam Constitutional:      General: She is not in acute distress.    Appearance: Normal appearance. She is not ill-appearing.  HENT:     Head:  Normocephalic and atraumatic.     Right Ear: Tympanic membrane, ear canal and external ear normal.     Left Ear: Tympanic membrane, ear canal and external ear normal.     Mouth/Throat:     Mouth: Mucous membranes are moist.     Pharynx: No oropharyngeal exudate or posterior oropharyngeal erythema.  Eyes:     Conjunctiva/sclera: Conjunctivae normal.  Cardiovascular:     Rate and Rhythm: Normal rate and regular rhythm.  Pulmonary:     Effort: Pulmonary effort is normal. No respiratory distress.     Breath sounds: Normal breath sounds. No wheezing or rales.  Musculoskeletal:     Cervical back: Neck supple. No tenderness.  Lymphadenopathy:     Cervical: No cervical adenopathy.  Skin:    General: Skin is warm and dry.  Neurological:     Mental Status: She is alert.            Assessment & Plan:    See Problem List for Assessment and Plan of chronic medical problems.

## 2023-07-04 ENCOUNTER — Other Ambulatory Visit: Payer: Self-pay

## 2023-07-04 ENCOUNTER — Encounter: Payer: Self-pay | Admitting: Internal Medicine

## 2023-07-04 ENCOUNTER — Other Ambulatory Visit (HOSPITAL_COMMUNITY): Payer: Self-pay

## 2023-07-04 ENCOUNTER — Ambulatory Visit (INDEPENDENT_AMBULATORY_CARE_PROVIDER_SITE_OTHER): Payer: PPO | Admitting: Internal Medicine

## 2023-07-04 VITALS — BP 132/68 | HR 80 | Temp 98.3°F | Ht 63.0 in | Wt 106.0 lb

## 2023-07-04 DIAGNOSIS — I10 Essential (primary) hypertension: Secondary | ICD-10-CM

## 2023-07-04 DIAGNOSIS — J019 Acute sinusitis, unspecified: Secondary | ICD-10-CM | POA: Diagnosis not present

## 2023-07-04 MED ORDER — AMOXICILLIN-POT CLAVULANATE 400-57 MG/5ML PO SUSR
800.0000 mg | Freq: Two times a day (BID) | ORAL | 0 refills | Status: AC
  Filled 2023-07-04 (×5): qty 200, 10d supply, fill #0

## 2023-07-04 MED ORDER — VALSARTAN 40 MG PO TABS
40.0000 mg | ORAL_TABLET | Freq: Every day | ORAL | 1 refills | Status: DC
  Filled 2023-07-04 – 2023-08-03 (×2): qty 90, 90d supply, fill #0
  Filled 2023-10-31: qty 90, 90d supply, fill #1

## 2023-07-04 NOTE — Assessment & Plan Note (Signed)
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

## 2023-07-04 NOTE — Assessment & Plan Note (Signed)
Chronic Blood pressure well controlled Continue valsartan 40 mg daily, amlodipine 2.5 mg daily

## 2023-07-06 ENCOUNTER — Encounter: Payer: PPO | Admitting: Rheumatology

## 2023-07-09 ENCOUNTER — Other Ambulatory Visit (HOSPITAL_BASED_OUTPATIENT_CLINIC_OR_DEPARTMENT_OTHER): Payer: Self-pay

## 2023-07-23 DIAGNOSIS — H353221 Exudative age-related macular degeneration, left eye, with active choroidal neovascularization: Secondary | ICD-10-CM | POA: Diagnosis not present

## 2023-07-31 ENCOUNTER — Encounter: Payer: Self-pay | Admitting: Internal Medicine

## 2023-07-31 NOTE — Progress Notes (Unsigned)
Subjective:    Patient ID: Jessica Sherman, female    DOB: [birth year], 89 y.o.   MRN: [id number]     HPI Jessica Sherman is here for follow up of her chronic medical problems.  Was here recently for sinus infection-that has cleared with the antibiotic.  Her energy level is still low.  She finds that if she is in the house and she is doing laundry and making beds she does get a little tired and needs to sit down a little bit, but she is overall very active and is going out and doing things with friends and family.  She states whenever she gets out she does not feel tired.  Continues to be very active.  Overall feels well and has no concerns.   Medications and allergies reviewed with patient and updated if appropriate.  Current Outpatient Medications on File Prior to Visit  Medication Sig Dispense Refill   amLODipine (NORVASC) 2.5 MG tablet Take 1 tablet (2.5 mg total) by mouth daily. 90 tablet 3   Calcium Carb-Cholecalciferol (CALCIUM 600+D3 PO) Take 1 tablet by mouth 2 (two) times daily.     Cholecalciferol (VITAMIN D3) 1000 UNITS CAPS Take 1,000 Units by mouth daily.     levothyroxine (SYNTHROID) 75 MCG tablet Take 1 tablet (75 mcg total) by mouth daily. 90 tablet 2   Multiple Vitamins-Minerals (PRESERVISION AREDS PO) Take by mouth in the morning and at bedtime.     simvastatin (ZOCOR) 20 MG tablet Take 1 tablet (20 mg total) by mouth at bedtime. 90 tablet 2   valsartan (DIOVAN) 40 MG tablet Take 1 tablet (40 mg total) by mouth daily. 90 tablet 1   [DISCONTINUED] prochlorperazine (COMPAZINE) 10 MG tablet Take 1 tablet (10 mg total) by mouth every 6 (six) hours as needed (Nausea or vomiting). (Patient not taking: Reported on 07/27/2016) 30 tablet 1   No current facility-administered medications on file prior to visit.     Review of Systems  Constitutional:  Negative for appetite change and fever.  Respiratory:  Negative for cough, shortness of breath and wheezing.    Cardiovascular:  Negative for chest pain, palpitations and leg swelling.  Endocrine: Positive for cold intolerance.  Neurological:  Negative for light-headedness and headaches.       Objective:   Vitals:   08/01/23 1031  BP: 116/72  Pulse: (!) 55  Temp: 97.9 F (36.6 C)  SpO2: 98%   BP Readings from Last 3 Encounters:  08/01/23 116/72  07/04/23 132/68  01/31/23 118/68   Wt Readings from Last 3 Encounters:  08/01/23 104 lb (47.2 kg)  07/04/23 106 lb (48.1 kg)  04/19/23 101 lb (45.8 kg)   Body mass index is 18.42 kg/m.    Physical Exam Constitutional:      General: She is not in acute distress.    Appearance: Normal appearance.  HENT:     Head: Normocephalic and atraumatic.  Eyes:     Conjunctiva/sclera: Conjunctivae normal.  Cardiovascular:     Rate and Rhythm: Normal rate and regular rhythm.     Heart sounds: Normal heart sounds.  Pulmonary:     Effort: Pulmonary effort is normal. No respiratory distress.     Breath sounds: Normal breath sounds. No wheezing.  Musculoskeletal:     Cervical back: Neck supple.     Right lower leg: No edema.     Left lower leg: No edema.  Lymphadenopathy:     Cervical: No cervical adenopathy.  Skin:    General: Skin is warm and dry.     Findings: No rash.  Neurological:     Mental Status: She is alert. Mental status is at baseline.  Psychiatric:        Mood and Affect: Mood normal.        Behavior: Behavior normal.        Lab Results  Component Value Date   WBC 6.2 01/31/2023   HGB 12.2 01/31/2023   HCT 37.3 01/31/2023   PLT 268.0 01/31/2023   GLUCOSE 100 (H) 01/31/2023   CHOL 156 01/31/2023   TRIG 56.0 01/31/2023   HDL 80.20 01/31/2023   LDLDIRECT 57.0 01/27/2019   LDLCALC 64 01/31/2023   ALT 17 01/31/2023   AST 23 01/31/2023   NA 131 (L) 01/31/2023   K 4.7 01/31/2023   CL 94 (L) 01/31/2023   CREATININE 0.80 01/31/2023   BUN 17 01/31/2023   CO2 31 01/31/2023   TSH 0.95 01/31/2023   INR 0.9 04/17/2019    HGBA1C 6.0 01/31/2023     Assessment & Plan:    See Problem List for Assessment and Plan of chronic medical problems.

## 2023-07-31 NOTE — Patient Instructions (Addendum)
      Blood work was ordered.       Medications changes include :   None    A referral was ordered and someone will call you to schedule an appointment.     Return in about 6 months (around 01/29/2024) for Physical Exam.

## 2023-08-01 ENCOUNTER — Ambulatory Visit (INDEPENDENT_AMBULATORY_CARE_PROVIDER_SITE_OTHER): Payer: PPO | Admitting: Internal Medicine

## 2023-08-01 VITALS — BP 116/72 | HR 55 | Temp 97.9°F | Ht 63.0 in | Wt 104.0 lb

## 2023-08-01 DIAGNOSIS — E782 Mixed hyperlipidemia: Secondary | ICD-10-CM

## 2023-08-01 DIAGNOSIS — I10 Essential (primary) hypertension: Secondary | ICD-10-CM

## 2023-08-01 DIAGNOSIS — E039 Hypothyroidism, unspecified: Secondary | ICD-10-CM

## 2023-08-01 DIAGNOSIS — Z8639 Personal history of other endocrine, nutritional and metabolic disease: Secondary | ICD-10-CM | POA: Insufficient documentation

## 2023-08-01 DIAGNOSIS — R5383 Other fatigue: Secondary | ICD-10-CM

## 2023-08-01 DIAGNOSIS — R7303 Prediabetes: Secondary | ICD-10-CM

## 2023-08-01 DIAGNOSIS — E559 Vitamin D deficiency, unspecified: Secondary | ICD-10-CM

## 2023-08-01 DIAGNOSIS — I700 Atherosclerosis of aorta: Secondary | ICD-10-CM

## 2023-08-01 LAB — LIPID PANEL
Cholesterol: 141 mg/dL (ref 0–200)
HDL: 63.7 mg/dL (ref >39.00)
LDL Cholesterol: 52 mg/dL (ref 0–99)
NonHDL: 76.86
Total CHOL/HDL Ratio: 2
Triglycerides: 122 mg/dL (ref 0.0–149.0)
VLDL: 24.4 mg/dL (ref 0.0–40.0)

## 2023-08-01 LAB — CBC WITH DIFFERENTIAL/PLATELET
Basophils Absolute: 0 10*3/uL (ref 0.0–0.1)
Basophils Relative: 0.4 % (ref 0.0–3.0)
Eosinophils Absolute: 0.1 10*3/uL (ref 0.0–0.7)
Eosinophils Relative: 0.9 % (ref 0.0–5.0)
HCT: 34.6 % — ABNORMAL LOW (ref 36.0–46.0)
Hemoglobin: 12.2 g/dL (ref 12.0–15.0)
Lymphocytes Relative: 12.9 % (ref 12.0–46.0)
Lymphs Abs: 0.9 10*3/uL (ref 0.7–4.0)
MCHC: 35.2 g/dL (ref 30.0–36.0)
MCV: 103.3 fL — ABNORMAL HIGH (ref 78.0–100.0)
Monocytes Absolute: 0.7 10*3/uL (ref 0.1–1.0)
Monocytes Relative: 10.4 % (ref 3.0–12.0)
Neutro Abs: 5 10*3/uL (ref 1.4–7.7)
Neutrophils Relative %: 75.4 % (ref 43.0–77.0)
Platelets: 265 10*3/uL (ref 150.0–400.0)
RBC: 3.35 Mil/uL — ABNORMAL LOW (ref 3.87–5.11)
RDW: 12.5 % (ref 11.5–15.5)
WBC: 6.7 10*3/uL (ref 4.0–10.5)

## 2023-08-01 LAB — COMPREHENSIVE METABOLIC PANEL
ALT: 14 U/L (ref 0–35)
AST: 20 U/L (ref 0–37)
Albumin: 4.1 g/dL (ref 3.5–5.2)
Alkaline Phosphatase: 51 U/L (ref 39–117)
BUN: 20 mg/dL (ref 6–23)
CO2: 29 meq/L (ref 19–32)
Calcium: 9.6 mg/dL (ref 8.4–10.5)
Chloride: 95 meq/L — ABNORMAL LOW (ref 96–112)
Creatinine, Ser: 0.8 mg/dL (ref 0.40–1.20)
GFR: 64.67 mL/min (ref >60.00)
Glucose, Bld: 106 mg/dL — ABNORMAL HIGH (ref 70–99)
Potassium: 4.6 meq/L (ref 3.5–5.1)
Sodium: 129 meq/L — ABNORMAL LOW (ref 135–145)
Total Bilirubin: 0.4 mg/dL (ref 0.2–1.2)
Total Protein: 7.2 g/dL (ref 6.0–8.3)

## 2023-08-01 LAB — HEMOGLOBIN A1C: Hgb A1c MFr Bld: 6.1 % (ref 4.6–6.5)

## 2023-08-01 LAB — FERRITIN: Ferritin: 82 ng/mL (ref 10.0–291.0)

## 2023-08-01 LAB — IBC PANEL
Iron: 79 ug/dL (ref 42–145)
Saturation Ratios: 17 % — ABNORMAL LOW (ref 20.0–50.0)
TIBC: 463.4 ug/dL — ABNORMAL HIGH (ref 250.0–450.0)
Transferrin: 331 mg/dL (ref 212.0–360.0)

## 2023-08-01 LAB — TSH: TSH: 1.03 u[IU]/mL (ref 0.35–5.50)

## 2023-08-01 NOTE — Assessment & Plan Note (Signed)
Chronic Taking vitamin D daily Continue vitamin D daily-level normal earlier this year

## 2023-08-01 NOTE — Assessment & Plan Note (Signed)
Chronic Check lipid panel, CMP Continue simvastatin 20 mg daily Encouraged healthy diet, regular exercise

## 2023-08-01 NOTE — Assessment & Plan Note (Signed)
Chronic  Clinically euthyroid Check tsh and will titrate med dose if needed Currently taking levothyroxine 75 mcg daily  

## 2023-08-01 NOTE — Assessment & Plan Note (Signed)
History of iron deficiency Is experiencing a little bit more fatigue Will check iron levels to make sure they are not low.

## 2023-08-01 NOTE — Assessment & Plan Note (Signed)
Chronic °Regular exercise and healthy diet encouraged °Check lipid panel  °Continue simvastatin 20 mg daily °

## 2023-08-01 NOTE — Assessment & Plan Note (Signed)
Chronic Lab Results  Component Value Date   HGBA1C 6.0 01/31/2023   Check a1c Low sugar / carb diet Stressed regular exercise

## 2023-08-01 NOTE — Assessment & Plan Note (Signed)
Subacute Has been experiencing some fatigue for a little while No obvious cause-initially thought it was related to her recent sinus infection, but sinus infection has cleared and she is still fatigued Will be getting basic blood work today She will monitor her fatigue level and let me know if this does not improve in which case I would evaluate for cardiac issues

## 2023-08-01 NOTE — Assessment & Plan Note (Signed)
Chronic Blood pressure well controlled CBC, CMP Continue valsartan 40 mg daily, amlodipine 2.5 mg daily

## 2023-08-03 ENCOUNTER — Other Ambulatory Visit (HOSPITAL_COMMUNITY): Payer: Self-pay

## 2023-08-03 ENCOUNTER — Other Ambulatory Visit: Payer: Self-pay

## 2023-08-06 ENCOUNTER — Telehealth: Payer: Self-pay | Admitting: Internal Medicine

## 2023-08-06 ENCOUNTER — Encounter: Payer: Self-pay | Admitting: Internal Medicine

## 2023-08-06 NOTE — Telephone Encounter (Signed)
Spoke with patient today.  She is coming in tomorrow.

## 2023-08-06 NOTE — Progress Notes (Unsigned)
    Subjective:    Patient ID: Jessica Sherman, female    DOB: 1931-02-10, 89 y.o.   MRN: [id number]      HPI Jaqualine is here for No chief complaint on file.   Weakness     Low sodium - ? 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  Current Outpatient Medications on File Prior to Visit  Medication Sig Dispense Refill   amLODipine (NORVASC) 2.5 MG tablet Take 1 tablet (2.5 mg total) by mouth daily. 90 tablet 3   Calcium Carb-Cholecalciferol (CALCIUM 600+D3 PO) Take 1 tablet by mouth 2 (two) times daily.     Cholecalciferol (VITAMIN D3) 1000 UNITS CAPS Take 1,000 Units by mouth daily.     levothyroxine (SYNTHROID) 75 MCG tablet Take 1 tablet (75 mcg total) by mouth daily. 90 tablet 2   Multiple Vitamins-Minerals (PRESERVISION AREDS PO) Take by mouth in the morning and at bedtime.     simvastatin (ZOCOR) 20 MG tablet Take 1 tablet (20 mg total) by mouth at bedtime. 90 tablet 2   valsartan (DIOVAN) 40 MG tablet Take 1 tablet (40 mg total) by mouth daily. 90 tablet 1   [DISCONTINUED] prochlorperazine (COMPAZINE) 10 MG tablet Take 1 tablet (10 mg total) by mouth every 6 (six) hours as needed (Nausea or vomiting). (Patient not taking: Reported on 07/27/2016) 30 tablet 1   No current facility-administered medications on file prior to visit.    Review of Systems     Objective:  There were no vitals filed for this visit. BP Readings from Last 3 Encounters:  08/01/23 116/72  07/04/23 132/68  01/31/23 118/68   Wt Readings from Last 3 Encounters:  08/01/23 104 lb (47.2 kg)  07/04/23 106 lb (48.1 kg)  04/19/23 101 lb (45.8 kg)   There is no height or weight on file to calculate BMI.    Physical Exam         Assessment & Plan:    See Problem List for Assessment and Plan of chronic medical problems.

## 2023-08-06 NOTE — Telephone Encounter (Signed)
Patient spoke with Albin Felling regarding her lab results. She wants to know how concerned she should be about low sodium. She would like a call back at [phone].

## 2023-08-07 ENCOUNTER — Ambulatory Visit: Payer: PPO | Admitting: Internal Medicine

## 2023-08-07 VITALS — BP 124/60 | HR 76 | Temp 98.1°F | Ht 63.0 in | Wt 103.0 lb

## 2023-08-07 DIAGNOSIS — R42 Dizziness and giddiness: Secondary | ICD-10-CM

## 2023-08-07 DIAGNOSIS — R5383 Other fatigue: Secondary | ICD-10-CM | POA: Diagnosis not present

## 2023-08-07 DIAGNOSIS — R718 Other abnormality of red blood cells: Secondary | ICD-10-CM

## 2023-08-07 DIAGNOSIS — R7303 Prediabetes: Secondary | ICD-10-CM

## 2023-08-07 DIAGNOSIS — J019 Acute sinusitis, unspecified: Secondary | ICD-10-CM | POA: Diagnosis not present

## 2023-08-07 DIAGNOSIS — E871 Hypo-osmolality and hyponatremia: Secondary | ICD-10-CM

## 2023-08-07 DIAGNOSIS — D649 Anemia, unspecified: Secondary | ICD-10-CM

## 2023-08-07 DIAGNOSIS — E559 Vitamin D deficiency, unspecified: Secondary | ICD-10-CM

## 2023-08-07 LAB — BASIC METABOLIC PANEL
BUN: 22 mg/dL (ref 6–23)
CO2: 28 meq/L (ref 19–32)
Calcium: 9.8 mg/dL (ref 8.4–10.5)
Chloride: 95 meq/L — ABNORMAL LOW (ref 96–112)
Creatinine, Ser: 0.97 mg/dL (ref 0.40–1.20)
GFR: 51.31 mL/min — ABNORMAL LOW (ref >60.00)
Glucose, Bld: 142 mg/dL — ABNORMAL HIGH (ref 70–99)
Potassium: 4.5 meq/L (ref 3.5–5.1)
Sodium: 130 meq/L — ABNORMAL LOW (ref 135–145)

## 2023-08-07 LAB — FOLATE: Folate: >24.2 ng/mL (ref >5.9)

## 2023-08-07 LAB — VITAMIN B12: Vitamin B-12: 370 pg/mL (ref 211–911)

## 2023-08-07 LAB — VITAMIN D 25 HYDROXY (VIT D DEFICIENCY, FRACTURES): VITD: 43.54 ng/mL (ref 30.00–100.00)

## 2023-08-07 NOTE — Assessment & Plan Note (Signed)
Chronic Check a1c

## 2023-08-07 NOTE — Assessment & Plan Note (Signed)
Having episodic lightheadedness - this may be the cause of fatigue and not feeling well It is causing some anxiety Stop amlodipine 2.5 mg daily Monitor BP at home Continue good fluid intake

## 2023-08-07 NOTE — Assessment & Plan Note (Signed)
Chronic Taking vitamin d daily Check vitamin d level

## 2023-08-07 NOTE — Patient Instructions (Addendum)
      Blood work was ordered.       Medications changes include :   stop amlodipine for now and monitor your BP - goal BP is < 140/90    A referral was ordered for [facility] and someone will call you to schedule an appointment.     Return if symptoms worsen or fail to improve.

## 2023-08-07 NOTE — Assessment & Plan Note (Signed)
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 Need treatment or not Will refer to Endo

## 2023-08-07 NOTE — Assessment & Plan Note (Signed)
Completed treatment No evidence of residual infection

## 2023-08-07 NOTE — Assessment & Plan Note (Signed)
H/o anemia Iron level were good recently Check B12, folate level Doubt this is the cause of her fatigue

## 2023-08-07 NOTE — Assessment & Plan Note (Signed)
Acute Having fatigue/weakness Started with sinus infection ? Related to lightheadedness - will stop amlodipine and see if that improves fatigue and lightheadedness Anxiety may be playing a role

## 2023-08-07 NOTE — Assessment & Plan Note (Signed)
B12 level, folate

## 2023-08-16 DIAGNOSIS — H353133 Nonexudative age-related macular degeneration, bilateral, advanced atrophic without subfoveal involvement: Secondary | ICD-10-CM | POA: Diagnosis not present

## 2023-08-16 DIAGNOSIS — H52203 Unspecified astigmatism, bilateral: Secondary | ICD-10-CM | POA: Diagnosis not present

## 2023-08-16 DIAGNOSIS — Z961 Presence of intraocular lens: Secondary | ICD-10-CM | POA: Diagnosis not present

## 2023-08-19 NOTE — Progress Notes (Unsigned)
Office Visit Note  Patient: Jessica Sherman             Date of Birth: 12/17/1930           MRN: [id number]             PCP: Pincus Sanes, MD Referring: Pincus Sanes, MD Visit Date: 08/21/2023 Occupation: @GUAROCC @  Subjective:  Pain in both shoulders  History of Present Illness: Jessica Sherman is a 89 y.o. female seen in consultation per request of her PCP.  According the patient her shoulders have been bothering her for the last few months.  She states the pain has been worse since she had COVID-19 vaccine a few months ago in her right arm.  She states the pain occurs only at night when she is sleeping on her side.  She has to toss and turn all night to relieve the discomfort.  She has some difficulty raising her right arm..  She denies difficulty getting dressed.  None of the other joints are painful.  There is family history of osteoarthritis.  There is no family history of autoimmune disease.  She is gravida 4, para 4.  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  She has done crafts and arts all her life.  She enjoys walking for exercise.    Activities of Daily Living:  Patient reports morning stiffness for 10 minutes.   Patient Denies nocturnal pain.  Difficulty dressing/grooming: Denies Difficulty climbing stairs: Denies Difficulty getting out of chair: Denies Difficulty using hands for taps, buttons, cutlery, and/or writing: Reports  Review of Systems  Constitutional:  Positive for fatigue.  HENT:  Positive for mouth dryness. Negative for mouth sores.   Eyes:  Positive for dryness.  Respiratory:  Negative for shortness of breath.   Cardiovascular:  Negative for chest pain and palpitations.  Gastrointestinal:  Negative for blood in stool, constipation and diarrhea.  Endocrine: Negative for increased urination.  Genitourinary:  Negative for involuntary urination.  Musculoskeletal:  Positive for joint pain, joint pain and  morning stiffness. Negative for gait problem, joint swelling, myalgias, muscle weakness, muscle tenderness and myalgias.  Skin:  Negative for color change, rash, hair loss and sensitivity to sunlight.  Allergic/Immunologic: Negative for susceptible to infections.  Neurological:  Negative for dizziness and headaches.  Hematological:  Negative for swollen glands.  Psychiatric/Behavioral:  Positive for depressed mood. Negative for sleep disturbance. The patient is nervous/anxious.     PMFS History:  Patient Active Problem List   Diagnosis Date Noted   Episodic lightheadedness 08/07/2023   Elevated MCV 08/07/2023   History of iron deficiency 08/01/2023   Fatigue 08/01/2023   Acute sinus infection 07/04/2023   Dysphonia 06/29/2020   Aortic atherosclerosis (HCC) 05/27/2020   Anxiety 04/04/2019   Hyponatremia 03/28/2019   Cancer of base of tongue (HCC) 03/24/2019   Oropharyngeal dysphagia 03/14/2019   Dysphasia 01/27/2019   Hypertension 12/13/2016   S/P mastectomy, left 10/18/2016   History of breast cancer 10/18/2016   Port catheter in place 05/23/2016   Breast cancer of upper-outer quadrant of left female breast (HCC) 04/30/2016   Conductive hearing loss in right ear 12/09/2015   Enteritis 05/30/2013   Prediabetes    Macular degeneration, dry    Vertigo    Anemia 09/02/2010   Vitamin D deficiency 11/17/2008   History of colonic polyps 11/17/2008   Hypothyroidism 11/19/2007   HYPERLIPIDEMIA 11/19/2007   Osteopenia 02/18/2007  Past Medical History:  Diagnosis Date   Hx of colonic polyps    Hyperlipidemia    Hypothyroidism    Macular degeneration, dry    Osteopenia    spine BMD T score - 1.69   PONV (postoperative nausea and vomiting)    pt has vertigo and can not lie flat   Prediabetes    FBS 136  on 08/29/2010; A1c 6.2%   Tongue cancer (HCC)    Vertigo     Family History  Problem Relation Age of Onset   Osteoporosis Mother    Leukemia Mother    Aneurysm Father         AAA   Dementia Sister    Cancer Maternal Uncle        ? stomach   Heart attack Neg Hx    Past Surgical History:  Procedure Laterality Date   CATARACT EXTRACTION  2009   OS   COLONOSCOPY W/ POLYPECTOMY     X 2; last 2012. Dr Ewing Schlein   IR GASTROSTOMY TUBE MOD SED  04/17/2019   IR GASTROSTOMY TUBE REMOVAL  08/29/2019   IR GENERIC HISTORICAL  05/09/2016   IR US GUIDE VASC ACCESS RIGHT 05/09/2016 Malachy Moan, MD [facility]   IR GENERIC HISTORICAL  05/09/2016   IR FLUORO GUIDE CV LINE RIGHT 05/09/2016 Malachy Moan, MD [facility]   MODIFIED MASTECTOMY Left 10/18/2016   Procedure: LEFT MODIFIED RADICAL MASTECTOMY;  Surgeon: Abigail Miyamoto, MD;  Location: [facility];  Service: General;  Laterality: Left;   PILONIDAL CYST EXCISION     X 3   TONSILLECTOMY AND ADENOIDECTOMY     TOTAL ABDOMINAL HYSTERECTOMY     w/ bladder repair   Social History   Social History Narrative   Regular exercise- yes w/o symptoms   Immunization History  Administered Date(s) Administered   Fluad Quad(high Dose 65+) 05/23/2019, 05/27/2020, 06/24/2021, 05/29/2022   Influenza Split 06/21/2011   Influenza Whole 09/12/1999, 07/06/2005, 06/18/2008, 06/22/2010   Influenza, High Dose Seasonal PF 07/08/2013, 06/09/2017, 06/01/2018, 04/23/2023   Influenza,inj,Quad PF,6+ Mos 05/23/2016   Influenza-Unspecified 11/20/2014, 06/12/2015, 06/09/2017, 06/01/2018   PFIZER(Purple Top)SARS-COV-2 Vaccination 10/16/2019, 11/10/2019, 06/09/2020   Pfizer(Comirnaty)Fall Seasonal Vaccine 12 years and older 05/24/2023   Pneumococcal Conjugate-13 10/08/2015   Pneumococcal Polysaccharide-23 07/17/2002, 07/31/2013   Td 01/07/2010   Zoster Recombinant(Shingrix) 05/02/2018, 07/01/2018     Objective: Vital Signs: BP (!) 150/62 (BP Location: Right Arm, Patient Position: Sitting, Cuff Size: Normal)   Pulse (!) 54   Resp 16   Ht 5' 1.75" (1.568 m)   Wt 106 lb (48.1 kg)   BMI 19.54 kg/m    Physical  Exam Vitals and nursing note reviewed.  Constitutional:      Appearance: She is well-developed.  HENT:     Head: Normocephalic and atraumatic.  Eyes:     Conjunctiva/sclera: Conjunctivae normal.  Cardiovascular:     Rate and Rhythm: Normal rate and regular rhythm.     Heart sounds: Normal heart sounds.  Pulmonary:     Effort: Pulmonary effort is normal.     Breath sounds: Normal breath sounds.  Abdominal:     General: Bowel sounds are normal.     Palpations: Abdomen is soft.  Musculoskeletal:     Cervical back: Normal range of motion.  Lymphadenopathy:     Cervical: No cervical adenopathy.  Skin:    General: Skin is warm and dry.     Capillary Refill: Capillary refill takes less than 2 seconds.  Neurological:     Mental Status: She is alert and oriented to person, place, and time.  Psychiatric:        Behavior: Behavior normal.     Musculoskeletal Exam: She had limited lateral rotation of the cervical spine.  There was no tenderness over thoracic or lumbar spine.  Lumbar spine range of motion was difficult to assess that she has vertigo.  She had discomfort with abduction of bilateral shoulders.  Shoulder joints were in good range of motion.  Elbow joints, wrist joints, MCPs were in good range of motion with no synovitis.  She had bilateral CMC thickening, PIP and DIP thickening.  Hip joints and knee joints with good range of motion.  She had bilateral PIP and DIP thickening in her feet.  CDAI Exam: CDAI Score: -- Patient Global: --; Provider Global: -- Swollen: --; Tender: -- Joint Exam 08/21/2023   No joint exam has been documented for this visit   There is currently no information documented on the homunculus. Go to the Rheumatology activity and complete the homunculus joint exam.  Investigation: No additional findings.  Imaging: No results found.  Recent Labs: Lab Results  Component Value Date   WBC 6.7 08/01/2023   HGB 12.2 08/01/2023   PLT 265.0 08/01/2023    NA 130 (L) 08/07/2023   K 4.5 08/07/2023   CL 95 (L) 08/07/2023   CO2 28 08/07/2023   GLUCOSE 142 (H) 08/07/2023   BUN 22 08/07/2023   CREATININE 0.97 08/07/2023   BILITOT 0.4 08/01/2023   ALKPHOS 51 08/01/2023   AST 20 08/01/2023   ALT 14 08/01/2023   PROT 7.2 08/01/2023   ALBUMIN 4.1 08/01/2023   CALCIUM 9.8 08/07/2023   GFRAA >60 04/17/2019   August 01, 2023 LDL 52, iron saturation low, TIBC high, hemoglobin A1c 6.1, vitamin D 43.54, folate normal, B12 normal  Speciality Comments: No specialty comments available.  Procedures:  Large Joint Inj: R subacromial bursa on 08/21/2023 10:33 AM Indications: pain Details: 27 G 1.5 in needle, posterior approach  Arthrogram: No  Medications: 1 mL lidocaine 1 %; 40 mg triamcinolone acetonide 40 MG/ML Aspirate: 0 mL Outcome: tolerated well, no immediate complications Procedure, treatment alternatives, risks and benefits explained, specific risks discussed. Consent was given by the patient. Immediately prior to procedure a time out was called to verify the correct patient, procedure, equipment, support staff and site/side marked as required. Patient was prepped and draped in the usual sterile fashion.     Allergies: Other   Assessment / Plan:     Visit Diagnoses: Chronic pain of both shoulders -patient complains of pain and discomfort in her bilateral shoulders for the last 6 months.  She has difficulty sleeping on that side.  No warmth swelling or effusion was noted.  She had difficulty with abduction.  The symptoms appear to be in the subacromial region.  Plan: XR Shoulder Left, XR Shoulder Right.  X-rays of bilateral shoulders were suggestive of acromioclavicular arthritis.  X-ray findings were reviewed with the patient.  Different treatment options and their side effects were discussed.  As patient is experiencing constant discomfort after informed consent was obtained right shoulder joint was injected with lidocaine and Kenalog as  described above.  Patient tolerated the procedure well.  Postprocedure instructions were given.  If her left shoulder continues to hurt then we can bring her back for the left shoulder joint injection.  I did not inject her left shoulder today as her blood pressure was elevated.  She was advised to monitor blood pressure closely.  A handout on shoulder joint exercises was given.  Samples of Salonpas lidocaine patches were also given to be used topically.  Primary osteoarthritis of both hands-she had to bilateral CMC, PIP and DIP thickening consistent with osteoarthritis.  She is asymptomatic.  Detailed counseling on the osteoarthritis was provided.  Joint protection muscle strengthening was advised.  Primary osteoarthritis of both feet-she had Ro PIP and DIP thickening.  She denies any symptoms.  Osteopenia of multiple sites - 2018 T-score -2.2 AP spine.  Calcium rich diet was advised.  Vitamin D deficiency-vitamin D was normal on August 07, 2023.  Other medical problems are listed as follows:  Cancer of base of tongue (HCC)  Oropharyngeal dysphagia  Malignant neoplasm of upper-outer quadrant of left breast in female, estrogen receptor negative (HCC)  Other specified nutritional anemias  Aortic atherosclerosis (HCC)  Primary hypertension  Acquired hypothyroidism  Prediabetes  Vertigo  Conductive hearing loss of right ear, unspecified hearing status on contralateral side  Orders: Orders Placed This Encounter  Procedures   XR Shoulder Left   XR Shoulder Right   No orders of the defined types were placed in this encounter.    Follow-Up Instructions: Return if symptoms worsen or fail to improve, for Osteoarthritis.   Pollyann Savoy, MD  Note - This record has been created using Animal nutritionist.  Chart creation errors have been sought, but may not always  have been located. Such creation errors do not reflect on  the standard of medical care.

## 2023-08-21 ENCOUNTER — Ambulatory Visit: Payer: PPO | Attending: Rheumatology | Admitting: Rheumatology

## 2023-08-21 ENCOUNTER — Ambulatory Visit: Payer: PPO

## 2023-08-21 ENCOUNTER — Encounter: Payer: Self-pay | Admitting: Rheumatology

## 2023-08-21 VITALS — BP 154/58 | HR 50 | Resp 16 | Ht 61.75 in | Wt 106.0 lb

## 2023-08-21 DIAGNOSIS — E559 Vitamin D deficiency, unspecified: Secondary | ICD-10-CM

## 2023-08-21 DIAGNOSIS — M25512 Pain in left shoulder: Secondary | ICD-10-CM

## 2023-08-21 DIAGNOSIS — I10 Essential (primary) hypertension: Secondary | ICD-10-CM

## 2023-08-21 DIAGNOSIS — R7303 Prediabetes: Secondary | ICD-10-CM

## 2023-08-21 DIAGNOSIS — G8929 Other chronic pain: Secondary | ICD-10-CM | POA: Diagnosis not present

## 2023-08-21 DIAGNOSIS — M25511 Pain in right shoulder: Secondary | ICD-10-CM | POA: Diagnosis not present

## 2023-08-21 DIAGNOSIS — C50412 Malignant neoplasm of upper-outer quadrant of left female breast: Secondary | ICD-10-CM

## 2023-08-21 DIAGNOSIS — M19071 Primary osteoarthritis, right ankle and foot: Secondary | ICD-10-CM | POA: Diagnosis not present

## 2023-08-21 DIAGNOSIS — M19041 Primary osteoarthritis, right hand: Secondary | ICD-10-CM

## 2023-08-21 DIAGNOSIS — D538 Other specified nutritional anemias: Secondary | ICD-10-CM

## 2023-08-21 DIAGNOSIS — E039 Hypothyroidism, unspecified: Secondary | ICD-10-CM | POA: Diagnosis not present

## 2023-08-21 DIAGNOSIS — C01 Malignant neoplasm of base of tongue: Secondary | ICD-10-CM | POA: Diagnosis not present

## 2023-08-21 DIAGNOSIS — I700 Atherosclerosis of aorta: Secondary | ICD-10-CM

## 2023-08-21 DIAGNOSIS — M8589 Other specified disorders of bone density and structure, multiple sites: Secondary | ICD-10-CM | POA: Diagnosis not present

## 2023-08-21 DIAGNOSIS — R1312 Dysphagia, oropharyngeal phase: Secondary | ICD-10-CM

## 2023-08-21 DIAGNOSIS — M19072 Primary osteoarthritis, left ankle and foot: Secondary | ICD-10-CM

## 2023-08-21 DIAGNOSIS — Z171 Estrogen receptor negative status [ER-]: Secondary | ICD-10-CM

## 2023-08-21 DIAGNOSIS — M19042 Primary osteoarthritis, left hand: Secondary | ICD-10-CM

## 2023-08-21 DIAGNOSIS — H9011 Conductive hearing loss, unilateral, right ear, with unrestricted hearing on the contralateral side: Secondary | ICD-10-CM

## 2023-08-21 DIAGNOSIS — R42 Dizziness and giddiness: Secondary | ICD-10-CM

## 2023-08-21 MED ORDER — LIDOCAINE HCL 1 % IJ SOLN
1.0000 mL | INTRAMUSCULAR | Status: AC | PRN
  Administered 2023-08-21: 1 mL

## 2023-08-21 MED ORDER — TRIAMCINOLONE ACETONIDE 40 MG/ML IJ SUSP
40.0000 mg | INTRAMUSCULAR | Status: AC | PRN
  Administered 2023-08-21: 40 mg via INTRA_ARTICULAR

## 2023-08-21 NOTE — Patient Instructions (Signed)
Shoulder Exercises Ask your health care provider which exercises are safe for you. Do exercises exactly as told by your health care provider and adjust them as directed. It is normal to feel mild stretching, pulling, tightness, or discomfort as you do these exercises. Stop right away if you feel sudden pain or your pain gets worse. Do not begin these exercises until told by your health care provider. Stretching exercises External rotation and abduction This exercise is sometimes called corner stretch. The exercise rotates your arm outward (external rotation) and moves your arm out from your body (abduction). Stand in a doorway with one of your feet slightly in front of the other. This is called a staggered stance. If you cannot reach your forearms to the door frame, stand facing a corner of a room. Choose one of the following positions as told by your health care provider: Place your hands and forearms on the door frame above your head. Place your hands and forearms on the door frame at the height of your head. Place your hands on the door frame at the height of your elbows. Slowly move your weight onto your front foot until you feel a stretch across your chest and in the front of your shoulders. Keep your head and chest upright and keep your abdominal muscles tight. Hold for __________ seconds. To release the stretch, shift your weight to your back foot. Repeat __________ times. Complete this exercise __________ times a day. Extension, standing  Stand and hold a broomstick, a cane, or a similar object behind your back. Your hands should be a little wider than shoulder-width apart. Your palms should face away from your back. Keeping your elbows straight and your shoulder muscles relaxed, move the stick away from your body until you feel a stretch in your shoulders (extension). Avoid shrugging your shoulders while you move the stick. Keep your shoulder blades tucked down toward the middle of your  back. Hold for __________ seconds. Slowly return to the starting position. Repeat __________ times. Complete this exercise __________ times a day. Range-of-motion exercises Pendulum  Stand near a wall or a surface that you can hold onto for balance. Bend at the waist and let your left / right arm hang straight down. Use your other arm to support you. Keep your back straight and do not lock your knees. Relax your left / right arm and shoulder muscles, and move your hips and your trunk so your left / right arm swings freely. Your arm should swing because of the motion of your body, not because you are using your arm or shoulder muscles. Keep moving your hips and trunk so your arm swings in the following directions, as told by your health care provider: Side to side. Forward and backward. In clockwise and counterclockwise circles. Continue each motion for __________ seconds, or for as long as told by your health care provider. Slowly return to the starting position. Repeat __________ times. Complete this exercise __________ times a day. Shoulder flexion, standing  Stand and hold a broomstick, a cane, or a similar object. Place your hands a little more than shoulder-width apart on the object. Your left / right hand should be palm-up, and your other hand should be palm-down. Keep your elbow straight and your shoulder muscles relaxed. Push the stick up with your healthy arm to raise your left / right arm in front of your body, and then over your head until you feel a stretch in your shoulder (flexion). Avoid shrugging your shoulder  while you raise your arm. Keep your shoulder blade tucked down toward the middle of your back. Hold for __________ seconds. Slowly return to the starting position. Repeat __________ times. Complete this exercise __________ times a day. Shoulder abduction, standing  Stand and hold a broomstick, a cane, or a similar object. Place your hands a little more than  shoulder-width apart on the object. Your left / right hand should be palm-up, and your other hand should be palm-down. Keep your elbow straight and your shoulder muscles relaxed. Push the object across your body toward your left / right side. Raise your left / right arm to the side of your body (abduction) until you feel a stretch in your shoulder. Do not raise your arm above shoulder height unless your health care provider tells you to do that. If directed, raise your arm over your head. Avoid shrugging your shoulder while you raise your arm. Keep your shoulder blade tucked down toward the middle of your back. Hold for __________ seconds. Slowly return to the starting position. Repeat __________ times. Complete this exercise __________ times a day. Internal rotation  Place your left / right hand behind your back, palm-up. Use your other hand to dangle an exercise band, a broomstick, or a similar object over your shoulder. Grasp the band with your left / right hand so you are holding on to both ends. Gently pull up on the band until you feel a stretch in the front of your left / right shoulder. The movement of your arm toward the center of your body is called internal rotation. Avoid shrugging your shoulder while you raise your arm. Keep your shoulder blade tucked down toward the middle of your back. Hold for __________ seconds. Release the stretch by letting go of the band and lowering your hands. Repeat __________ times. Complete this exercise __________ times a day. Strengthening exercises External rotation  Sit in a stable chair without armrests. Secure an exercise band to a stable object at elbow height on your left / right side. Place a soft object, such as a folded towel or a small pillow, between your left / right upper arm and your body to move your elbow about 4 inches (10 cm) away from your side. Hold the end of the exercise band so it is tight and there is no slack. Keeping your  elbow pressed against the soft object, slowly move your forearm out, away from your abdomen (external rotation). Keep your body steady so only your forearm moves. Hold for __________ seconds. Slowly return to the starting position. Repeat __________ times. Complete this exercise __________ times a day. Shoulder abduction  Sit in a stable chair without armrests, or stand up. Hold a __________ lb / kg weight in your left / right hand, or hold an exercise band with both hands. Start with your arms straight down and your left / right palm facing in, toward your body. Slowly lift your left / right hand out to your side (abduction). Do not lift your hand above shoulder height unless your health care provider tells you that this is safe. Keep your arms straight. Avoid shrugging your shoulder while you do this movement. Keep your shoulder blade tucked down toward the middle of your back. Hold for __________ seconds. Slowly lower your arm, and return to the starting position. Repeat __________ times. Complete this exercise __________ times a day. Shoulder extension  Sit in a stable chair without armrests, or stand up. Secure an exercise band to a  stable object in front of you so it is at shoulder height. Hold one end of the exercise band in each hand. Straighten your elbows and lift your hands up to shoulder height. Squeeze your shoulder blades together as you pull your hands down to the sides of your thighs (extension). Stop when your hands are straight down by your sides. Do not let your hands go behind your body. Hold for __________ seconds. Slowly return to the starting position. Repeat __________ times. Complete this exercise __________ times a day. Shoulder row  Sit in a stable chair without armrests, or stand up. Secure an exercise band to a stable object in front of you so it is at chest height. Hold one end of the exercise band in each hand. Position your palms so that your thumbs are  facing the ceiling (neutral position). Bend each of your elbows to a 90-degree angle (right angle) and keep your upper arms at your sides. Step back or move the chair back until the band is tight and there is no slack. Slowly pull your elbows back behind you. Hold for __________ seconds. Slowly return to the starting position. Repeat __________ times. Complete this exercise __________ times a day. Shoulder press-ups  Sit in a stable chair that has armrests. Sit upright, with your feet flat on the floor. Put your hands on the armrests so your elbows are bent and your fingers are pointing forward. Your hands should be about even with the sides of your body. Push down on the armrests and use your arms to lift yourself off the chair. Straighten your elbows and lift yourself up as much as you comfortably can. Move your shoulder blades down, and avoid letting your shoulders move up toward your ears. Keep your feet on the ground. As you get stronger, your feet should support less of your body weight as you lift yourself up. Hold for __________ seconds. Slowly lower yourself back into the chair. Repeat __________ times. Complete this exercise __________ times a day. Wall push-ups  Stand so you are facing a stable wall. Your feet should be about one arm-length away from the wall. Lean forward and place your palms on the wall at shoulder height. Keep your feet flat on the floor as you bend your elbows and lean forward toward the wall. Hold for __________ seconds. Straighten your elbows to push yourself back to the starting position. Repeat __________ times. Complete this exercise __________ times a day. This information is not intended to replace advice given to you by your health care provider. Make sure you discuss any questions you have with your health care provider. Document Revised: 10/18/2021 Document Reviewed: 10/18/2021 Elsevier Patient Education  2024 [employer] Hand Exercises Hand  exercises can be helpful for almost anyone. They can strengthen your hands and improve flexibility and movement. The exercises can also increase blood flow to the hands. These results can make your work and daily tasks easier for you. Hand exercises can be especially helpful for people who have joint pain from arthritis or nerve damage from using their hands over and over. These exercises can also help people who injure a hand. Exercises Most of these hand exercises are gentle stretching and motion exercises. It is usually safe to do them often throughout the day. Warming up your hands before exercise may help reduce stiffness. You can do this with gentle massage or by placing your hands in warm water for 10-15 minutes. It is normal to feel some stretching, pulling,  tightness, or mild discomfort when you begin new exercises. In time, this will improve. Remember to always be careful and stop right away if you feel sudden, very bad pain or your pain gets worse. You want to get better and be safe. Ask your health care provider which exercises are safe for you. Do exercises exactly as told by your provider and adjust them as told. Do not begin these exercises until told by your provider. Knuckle bend or "claw" fist  Stand or sit with your arm, hand, and all five fingers pointed straight up. Make sure to keep your wrist straight. Gently bend your fingers down toward your palm until the tips of your fingers are touching your palm. Keep your big knuckle straight and only bend the small knuckles in your fingers. Hold this position for 10 seconds. Straighten your fingers back to your starting position. Repeat this exercise 5-10 times with each hand. Full finger fist  Stand or sit with your arm, hand, and all five fingers pointed straight up. Make sure to keep your wrist straight. Gently bend your fingers into your palm until the tips of your fingers are touching the middle of your palm. Hold this position  for 10 seconds. Extend your fingers back to your starting position, stretching every joint fully. Repeat this exercise 5-10 times with each hand. Straight fist  Stand or sit with your arm, hand, and all five fingers pointed straight up. Make sure to keep your wrist straight. Gently bend your fingers at the big knuckle, where your fingers meet your hand, and at the middle knuckle. Keep the knuckle at the tips of your fingers straight and try to touch the bottom of your palm. Hold this position for 10 seconds. Extend your fingers back to your starting position, stretching every joint fully. Repeat this exercise 5-10 times with each hand. Tabletop  Stand or sit with your arm, hand, and all five fingers pointed straight up. Make sure to keep your wrist straight. Gently bend your fingers at the big knuckle, where your fingers meet your hand, as far down as you can. Keep the small knuckles in your fingers straight. Think of forming a tabletop with your fingers. Hold this position for 10 seconds. Extend your fingers back to your starting position, stretching every joint fully. Repeat this exercise 5-10 times with each hand. Finger spread  Place your hand flat on a table with your palm facing down. Make sure your wrist stays straight. Spread your fingers and thumb apart from each other as far as you can until you feel a gentle stretch. Hold this position for 10 seconds. Bring your fingers and thumb tight together again. Hold this position for 10 seconds. Repeat this exercise 5-10 times with each hand. Making circles  Stand or sit with your arm, hand, and all five fingers pointed straight up. Make sure to keep your wrist straight. Make a circle by touching the tip of your thumb to the tip of your index finger. Hold for 10 seconds. Then open your hand wide. Repeat this motion with your thumb and each of your fingers. Repeat this exercise 5-10 times with each hand. Thumb motion  Sit with your  forearm resting on a table and your wrist straight. Your thumb should be facing up toward the ceiling. Keep your fingers relaxed as you move your thumb. Lift your thumb up as high as you can toward the ceiling. Hold for 10 seconds. Bend your thumb across your palm as far as  you can, reaching the tip of your thumb for the small finger (pinkie) side of your palm. Hold for 10 seconds. Repeat this exercise 5-10 times with each hand. Grip strengthening  Hold a stress ball or other soft ball in the middle of your hand. Slowly increase the pressure, squeezing the ball as much as you can without causing pain. Think of bringing the tips of your fingers into the middle of your palm. All of your finger joints should bend when doing this exercise. Hold your squeeze for 10 seconds, then relax. Repeat this exercise 5-10 times with each hand. Contact a health care provider if: Your hand pain or discomfort gets much worse when you do an exercise. Your hand pain or discomfort does not improve within 2 hours after you exercise. If you have either of these problems, stop doing these exercises right away. Do not do them again unless your provider says that you can. Get help right away if: You develop sudden, severe hand pain or swelling. If this happens, stop doing these exercises right away. Do not do them again unless your provider says that you can. This information is not intended to replace advice given to you by your health care provider. Make sure you discuss any questions you have with your health care provider. Document Revised: 09/12/2022 Document Reviewed: 09/12/2022 Elsevier Patient Education  2024 [employer].

## 2023-08-23 ENCOUNTER — Other Ambulatory Visit (HOSPITAL_COMMUNITY): Payer: Self-pay

## 2023-08-23 MED FILL — Simvastatin Tab 20 MG: ORAL | 90 days supply | Qty: 90 | Fill #1 | Status: AC

## 2023-08-23 MED FILL — Levothyroxine Sodium Tab 75 MCG: ORAL | 90 days supply | Qty: 90 | Fill #1 | Status: AC

## 2023-09-13 ENCOUNTER — Telehealth: Payer: PPO | Admitting: Nurse Practitioner

## 2023-09-13 ENCOUNTER — Telehealth: Payer: Self-pay

## 2023-09-13 DIAGNOSIS — R42 Dizziness and giddiness: Secondary | ICD-10-CM | POA: Insufficient documentation

## 2023-09-13 NOTE — Telephone Encounter (Signed)
 Copied from CRM [id number]. Topic: Clinical - Medical Advice >> Sep 13, 2023  8:58 AM Russell PARAS wrote: Reason for CRM: Patient called in with symptoms of ear pressure and light headedness since last Sunday. She denies any other symptoms, including fever. She does follow with ENT who regularly clears the earwax from her ears. Requests call back to see if there is any over the counter treatments she can try. CB# [phone]

## 2023-09-13 NOTE — Progress Notes (Signed)
   Established Patient Office Visit  An audio/visual tele-health visit was completed today for this patient. I connected with  Jessica Sherman on 09/13/23 utilizing audio/visual technology and verified that I am speaking with the correct person using two identifiers. The patient was located at their home, and I was located at the office of [facility] during the encounter. I discussed the limitations of evaluation and management by telemedicine. The patient expressed understanding and agreed to proceed.     Subjective   Patient ID: Jessica Sherman, female    DOB: August 07, 1931  Age: 89 y.o. MRN: [id number]  Chief Complaint  Patient presents with   Dizziness    Patient arrives today for virtual visit is accompanied by her daughter and granddaughter.  She reports that 4 days ago she had a few episodes of dizziness that resolved spontaneously fairly quickly.  She wanted evaluation because this is very concerning to her and is causing anxiety.  She reports having anxiety at baseline.  She denies any chest pain or palpitations during the episode.  She denies any sensory changes or weakness.  She is reporting some mild ear pain and hearing loss.  She has had vertigo in the past and reports symptoms do not seem consistent with previous vertigo symptoms.  She has had an episode similar to this in the past and was treated for fluid in her years.  She does see ENT, but was unable to get in to be seen by them today.  She was unable to check vital signs in preparation for her visit today.    Review of Systems  HENT:  Positive for ear pain and hearing loss.   Eyes:  Negative for blurred vision and double vision.  Cardiovascular:  Negative for chest pain and palpitations.  Neurological:  Positive for dizziness. Negative for sensory change, loss of consciousness (felt near syncopal) and weakness.      Objective:     There were no vitals taken for this visit.   Physical  Exam Comprehensive physical exam not completed today as office visit was conducted remotely.  Patient appeared well on camera, no acute distress identified.  Patient was alert and oriented, and appeared to have appropriate judgment.   No results found for any visits on 09/13/23.    The ASCVD Risk score (Arnett DK, et al., 2019) failed to calculate for the following reasons:   The 2019 ASCVD risk score is only valid for ages 61 to 64    Assessment & Plan:   Problem List Items Addressed This Visit       Other   Dizziness - Primary   Acute, etiology unclear Reassured by lack of other cardiac or neurologic associated symptoms. I do feel this concern needs in person evaluation.  She will call ENT office tomorrow to see if she can be seen in person.  I will also see if we can schedule her for an in person appointment as soon as possible at this office as well.  Will reach out to administration to see if any overrides and scheduling can be made. In the meantime patient was told to proceed to the emergency department if symptoms worsen or persist.  She reports understanding.  Daughter also reports understanding.       No follow-ups on file.    Lauraine FORBES Pereyra, NP

## 2023-09-13 NOTE — Telephone Encounter (Signed)
 Message left for patient to return call to clinic.

## 2023-09-13 NOTE — Assessment & Plan Note (Signed)
 Acute, etiology unclear Reassured by lack of other cardiac or neurologic associated symptoms. I do feel this concern needs in person evaluation.  She will call ENT office tomorrow to see if she can be seen in person.  I will also see if we can schedule her for an in person appointment as soon as possible at this office as well.  Will reach out to administration to see if any overrides and scheduling can be made. In the meantime patient was told to proceed to the emergency department if symptoms worsen or persist.  She reports understanding.  Daughter also reports understanding.

## 2023-09-15 ENCOUNTER — Other Ambulatory Visit: Payer: Self-pay | Admitting: Internal Medicine

## 2023-09-15 DIAGNOSIS — E871 Hypo-osmolality and hyponatremia: Secondary | ICD-10-CM

## 2023-09-17 ENCOUNTER — Other Ambulatory Visit (HOSPITAL_COMMUNITY): Payer: Self-pay

## 2023-09-17 ENCOUNTER — Encounter: Payer: Self-pay | Admitting: Internal Medicine

## 2023-09-17 ENCOUNTER — Ambulatory Visit (INDEPENDENT_AMBULATORY_CARE_PROVIDER_SITE_OTHER): Payer: PPO | Admitting: Internal Medicine

## 2023-09-17 VITALS — BP 116/62 | HR 40 | Temp 98.1°F | Ht 61.75 in | Wt 102.0 lb

## 2023-09-17 DIAGNOSIS — E559 Vitamin D deficiency, unspecified: Secondary | ICD-10-CM | POA: Diagnosis not present

## 2023-09-17 DIAGNOSIS — R7303 Prediabetes: Secondary | ICD-10-CM

## 2023-09-17 DIAGNOSIS — I10 Essential (primary) hypertension: Secondary | ICD-10-CM

## 2023-09-17 DIAGNOSIS — J0120 Acute ethmoidal sinusitis, unspecified: Secondary | ICD-10-CM | POA: Diagnosis not present

## 2023-09-17 DIAGNOSIS — J329 Chronic sinusitis, unspecified: Secondary | ICD-10-CM | POA: Insufficient documentation

## 2023-09-17 MED ORDER — BUSPIRONE HCL 7.5 MG PO TABS
7.5000 mg | ORAL_TABLET | Freq: Two times a day (BID) | ORAL | 2 refills | Status: DC
  Filled 2023-09-17: qty 60, 30d supply, fill #0

## 2023-09-17 MED ORDER — AMOXICILLIN-POT CLAVULANATE 600-42.9 MG/5ML PO SUSR
600.0000 mg | Freq: Two times a day (BID) | ORAL | 0 refills | Status: DC
  Filled 2023-09-17: qty 125, 13d supply, fill #0

## 2023-09-17 NOTE — Assessment & Plan Note (Addendum)
 Mild to mod, for antibx course - augmentin soln,  to f/u any worsening symptoms or concerns

## 2023-09-17 NOTE — Assessment & Plan Note (Signed)
 BP Readings from Last 3 Encounters:  09/17/23 116/62  08/21/23 (!) 154/58  08/07/23 124/60   Stable, pt to continue medical treatment diovan 40 mg

## 2023-09-17 NOTE — Patient Instructions (Signed)
 Please take all new medication as prescribed - the antibiotic in solution, and buspar  7.5 mg twice per day as needed  Please continue all other medications as before, and refills have been done if requested.  Please have the pharmacy call with any other refills you may need.  Please continue your efforts at being more active, low cholesterol diet, and weight control  Please keep your appointments with your specialists as you may have planned

## 2023-09-17 NOTE — Progress Notes (Signed)
 Patient ID: Jessica Sherman, female   DOB: June 18, 1931, 89 y.o.   MRN: [id number]        Chief Complaint: follow up sinusitis, anxiety, preDM, low vit d       HPI:  Jessica Sherman is a 89 y.o. female  Here with 2-3 days acute onset fever, behind the nose pain, pressure, headache, general weakness and malaise, and greenish d/c, with mild ST and cough, but pt denies chest pain, wheezing, increased sob or doe, orthopnea, PND, increased LE swelling, palpitations, dizziness or syncope.  Denies worsening depressive symptoms, suicidal ideation, or panic; has ongoing anxiety,  Pt denies polydipsia, polyuria, or new focal neuro s/s.          Wt Readings from Last 3 Encounters:  09/17/23 102 lb (46.3 kg)  08/21/23 106 lb (48.1 kg)  08/07/23 103 lb (46.7 kg)   BP Readings from Last 3 Encounters:  09/17/23 116/62  08/21/23 (!) 154/58  08/07/23 124/60         Past Medical History:  Diagnosis Date   Hx of colonic polyps    Hyperlipidemia    Hypothyroidism    Macular degeneration, dry    Osteopenia    spine BMD T score - 1.69   PONV (postoperative nausea and vomiting)    pt has vertigo and can not lie flat   Prediabetes    FBS 136  on 08/29/2010; A1c 6.2%   Tongue cancer (HCC)    Vertigo    Past Surgical History:  Procedure Laterality Date   CATARACT EXTRACTION  2009   OS   COLONOSCOPY W/ POLYPECTOMY     X 2; last 2012. Dr Rosalie   IR GASTROSTOMY TUBE MOD SED  04/17/2019   IR GASTROSTOMY TUBE REMOVAL  08/29/2019   IR GENERIC HISTORICAL  05/09/2016   IR US  GUIDE VASC ACCESS RIGHT 05/09/2016 Wilkie Lent, MD [facility]   IR GENERIC HISTORICAL  05/09/2016   IR FLUORO GUIDE CV LINE RIGHT 05/09/2016 Wilkie Lent, MD [facility]   MODIFIED MASTECTOMY Left 10/18/2016   Procedure: LEFT MODIFIED RADICAL MASTECTOMY;  Surgeon: Vicenta Poli, MD;  Location: [facility];  Service: General;  Laterality: Left;   PILONIDAL CYST EXCISION     X 3   TONSILLECTOMY AND  ADENOIDECTOMY     TOTAL ABDOMINAL HYSTERECTOMY     w/ bladder repair    reports that she has never smoked. She has been exposed to tobacco smoke. She has never used smokeless tobacco. She reports current alcohol use. She reports that she does not use drugs. family history includes Aneurysm in her father; Cancer in her maternal uncle; Dementia in her sister; Leukemia in her mother; Osteoporosis in her mother. Allergies  Allergen Reactions   Other Nausea And Vomiting    Anesthesia makes me very sick, Please pre-medicate to control nausea and vomiting   Current Outpatient Medications on File Prior to Visit  Medication Sig Dispense Refill   Calcium  Carb-Cholecalciferol  (CALCIUM  600+D3 PO) Take 1 tablet by mouth 2 (two) times daily.     Cholecalciferol  (VITAMIN D3) 1000 UNITS CAPS Take 1,000 Units by mouth daily.     levothyroxine  (SYNTHROID ) 75 MCG tablet Take 1 tablet (75 mcg total) by mouth daily. 90 tablet 2   Multiple Vitamins-Minerals (PRESERVISION AREDS PO) Take by mouth in the morning and at bedtime.     simvastatin  (ZOCOR ) 20 MG tablet Take 1 tablet (20 mg total) by mouth at bedtime. 90 tablet 2  valsartan  (DIOVAN ) 40 MG tablet Take 1 tablet (40 mg total) by mouth daily. 90 tablet 1   [DISCONTINUED] prochlorperazine  (COMPAZINE ) 10 MG tablet Take 1 tablet (10 mg total) by mouth every 6 (six) hours as needed (Nausea or vomiting). (Patient not taking: Reported on 07/27/2016) 30 tablet 1   No current facility-administered medications on file prior to visit.        ROS:  All others reviewed and negative.  Objective        PE:  BP 116/62 (BP Location: Right Arm, Patient Position: Sitting, Cuff Size: Normal)   Pulse (!) 40   Temp 98.1 F (36.7 C) (Oral)   Ht 5' 1.75 (1.568 m)   Wt 102 lb (46.3 kg)   SpO2 98%   BMI 18.81 kg/m                 Constitutional: Pt appears mild ill               HENT: Head: NCAT.                Right Ear: External ear normal.                 Left  Ear: External ear normal. Bilat tm's with mild erythema.  Max sinus areas mild tender.  Pharynx with mild erythema, no exudate                Eyes: . Pupils are equal, round, and reactive to light. Conjunctivae and EOM are normal               Nose: without d/c or deformity               Neck: Neck supple. Gross normal ROM               Cardiovascular: Normal rate and regular rhythm.                 Pulmonary/Chest: Effort normal and breath sounds without rales or wheezing.                Abd:  Soft, NT, ND, + BS, no organomegaly               Neurological: Pt is alert. At baseline orientation, motor grossly intact               Skin: Skin is warm. No rashes, no other new lesions, LE edema - none               Psychiatric: Pt behavior is normal without agitation , mod nervous  Micro: none  Cardiac tracings I have personally interpreted today:  none  Pertinent Radiological findings (summarize): none   Lab Results  Component Value Date   WBC 6.7 08/01/2023   HGB 12.2 08/01/2023   HCT 34.6 (L) 08/01/2023   PLT 265.0 08/01/2023   GLUCOSE 142 (H) 08/07/2023   CHOL 141 08/01/2023   TRIG 122.0 08/01/2023   HDL 63.70 08/01/2023   LDLDIRECT 57.0 01/27/2019   LDLCALC 52 08/01/2023   ALT 14 08/01/2023   AST 20 08/01/2023   NA 130 (L) 08/07/2023   K 4.5 08/07/2023   CL 95 (L) 08/07/2023   CREATININE 0.97 08/07/2023   BUN 22 08/07/2023   CO2 28 08/07/2023   TSH 1.03 08/01/2023   INR 0.9 04/17/2019   HGBA1C 6.1 08/01/2023   Assessment/Plan:  Jessica Sherman is a 89 y.o. White or Caucasian [1] female  with  has a past medical history of colonic polyps, Hyperlipidemia, Hypothyroidism, Macular degeneration, dry, Osteopenia, PONV (postoperative nausea and vomiting), Prediabetes, Tongue cancer (HCC), and Vertigo.  Vitamin D  deficiency Last vitamin D  Lab Results  Component Value Date   VD25OH 43.54 08/07/2023   Stable, cont oral replacement   Prediabetes Lab Results  Component  Value Date   HGBA1C 6.1 08/01/2023   Stable, pt to continue current medical treatment - diet,wt control   Hypertension BP Readings from Last 3 Encounters:  09/17/23 116/62  08/21/23 (!) 154/58  08/07/23 124/60   Stable, pt to continue medical treatment diovan  40 mg   Sinusitis Mild to mod, for antibx course - augmentin  soln,  to f/u any worsening symptoms or concerns  Followup: Return if symptoms worsen or fail to improve.  Jessica Rush, MD 09/17/2023 8:17 PM [facility]

## 2023-09-17 NOTE — Assessment & Plan Note (Signed)
 Lab Results  Component Value Date   HGBA1C 6.1 08/01/2023   Stable, pt to continue current medical treatment - diet,wt control

## 2023-09-17 NOTE — Assessment & Plan Note (Signed)
 Last vitamin D Lab Results  Component Value Date   VD25OH 43.54 08/07/2023   Stable, cont oral replacement

## 2023-09-18 ENCOUNTER — Other Ambulatory Visit (HOSPITAL_COMMUNITY): Payer: Self-pay

## 2023-09-18 ENCOUNTER — Other Ambulatory Visit: Payer: Self-pay

## 2023-09-19 ENCOUNTER — Telehealth: Payer: Self-pay | Admitting: Internal Medicine

## 2023-09-19 NOTE — Telephone Encounter (Signed)
 Copied from CRM [id number]. Topic: Clinical - Medical Advice >> Sep 19, 2023  9:50 AM Cherylynn B wrote: Reason for CRM: Patients daughter called in for clarity on why mother is being referred to Nephrology. Stated that during last office visit, only Endocrinology was mentioned and not Nephrology. Callback Work [phone] (Work number available until [place])

## 2023-09-19 NOTE — Telephone Encounter (Signed)
 Jessica JINNY Hope, MD 08/02/2023  8:03 PM EST     Your iron levels are good.  You do not have any anemia and all your blood counts are normal.  Your kidney function and liver tests are normal.  Your cholesterol is very good.  Your thyroid  function is normal.  Your sugars are stable in the prediabetic range.  Your sodium level is low-it has been chronically low. It would be a good idea for her to see a specialist to help determine why her sodium level is low - if she is ok with that I will refer her

## 2023-09-20 NOTE — Telephone Encounter (Signed)
Spoke with daughter and info given. 

## 2023-10-11 DIAGNOSIS — R42 Dizziness and giddiness: Secondary | ICD-10-CM | POA: Diagnosis not present

## 2023-10-11 DIAGNOSIS — J310 Chronic rhinitis: Secondary | ICD-10-CM | POA: Diagnosis not present

## 2023-10-11 DIAGNOSIS — H6123 Impacted cerumen, bilateral: Secondary | ICD-10-CM | POA: Diagnosis not present

## 2023-10-16 ENCOUNTER — Encounter: Payer: Self-pay | Admitting: Internal Medicine

## 2023-10-16 NOTE — Addendum Note (Signed)
Addended by: Pincus Sanes on: 10/16/2023 07:56 PM   Modules accepted: Orders

## 2023-10-16 NOTE — Telephone Encounter (Signed)
 Copied from CRM [id number]. Topic: Clinical - Medical Advice >> Oct 16, 2023 10:21 AM Isabell A wrote: Reason for CRM: Jon - daughter calling to leave a message for the nurse, requesting a callback in regards to patients referral to nephrologist and to discuss her anxiety.   Callback number: (from now to 4pm) [phone] (after [phone]

## 2023-10-16 NOTE — Telephone Encounter (Signed)
 She was prescribed buspar  -- did that not help? Side effects?   We can consider xanax  but -- the concern with xanax  is it can cause drowsiness, confusion or memory issues.  It is also potentially addicting but I know she will not use often.  I know she will use only as needed and a low dose but these side effects are important to understand

## 2023-10-18 ENCOUNTER — Other Ambulatory Visit (HOSPITAL_COMMUNITY): Payer: Self-pay

## 2023-10-18 MED ORDER — ALPRAZOLAM 0.25 MG PO TABS
0.2500 mg | ORAL_TABLET | Freq: Two times a day (BID) | ORAL | 2 refills | Status: DC | PRN
  Filled 2023-10-18: qty 30, 15d supply, fill #0
  Filled 2023-10-19: qty 30, 15d supply, fill #1
  Filled 2023-10-19: qty 30, 15d supply, fill #0

## 2023-10-18 NOTE — Telephone Encounter (Signed)
 Spoke with Jessica Sherman today (correction)

## 2023-10-18 NOTE — Telephone Encounter (Signed)
 Spoke with patient.

## 2023-10-18 NOTE — Addendum Note (Signed)
 Addended by: Colene Dauphin on: 10/18/2023 03:51 PM   Modules accepted: Orders

## 2023-10-18 NOTE — Telephone Encounter (Signed)
 She does not need to take buspar  every day -- it is ok to take as needed.  See how that works and if it does not workwe can try the xanax .

## 2023-10-18 NOTE — Telephone Encounter (Signed)
 Xanax  sent to pharmacy.  Buspar  taken off list

## 2023-10-19 ENCOUNTER — Other Ambulatory Visit: Payer: Self-pay

## 2023-10-19 ENCOUNTER — Other Ambulatory Visit (HOSPITAL_COMMUNITY): Payer: Self-pay

## 2023-10-31 ENCOUNTER — Other Ambulatory Visit (HOSPITAL_COMMUNITY): Payer: Self-pay

## 2023-11-05 ENCOUNTER — Other Ambulatory Visit (HOSPITAL_COMMUNITY): Payer: Self-pay

## 2023-11-22 ENCOUNTER — Other Ambulatory Visit (HOSPITAL_COMMUNITY): Payer: Self-pay

## 2023-11-22 MED FILL — Simvastatin Tab 20 MG: ORAL | 60 days supply | Qty: 60 | Fill #2 | Status: AC

## 2023-11-22 MED FILL — Levothyroxine Sodium Tab 75 MCG: ORAL | 60 days supply | Qty: 60 | Fill #2 | Status: AC

## 2023-11-26 DIAGNOSIS — H353221 Exudative age-related macular degeneration, left eye, with active choroidal neovascularization: Secondary | ICD-10-CM | POA: Diagnosis not present

## 2023-12-10 DIAGNOSIS — I10 Essential (primary) hypertension: Secondary | ICD-10-CM | POA: Diagnosis not present

## 2023-12-10 DIAGNOSIS — E871 Hypo-osmolality and hyponatremia: Secondary | ICD-10-CM | POA: Diagnosis not present

## 2023-12-10 DIAGNOSIS — N1831 Chronic kidney disease, stage 3a: Secondary | ICD-10-CM | POA: Diagnosis not present

## 2024-01-14 DIAGNOSIS — H353221 Exudative age-related macular degeneration, left eye, with active choroidal neovascularization: Secondary | ICD-10-CM | POA: Diagnosis not present

## 2024-01-23 ENCOUNTER — Other Ambulatory Visit: Payer: Self-pay | Admitting: Internal Medicine

## 2024-01-23 ENCOUNTER — Other Ambulatory Visit: Payer: Self-pay

## 2024-01-23 ENCOUNTER — Other Ambulatory Visit (HOSPITAL_COMMUNITY): Payer: Self-pay

## 2024-01-23 MED ORDER — SIMVASTATIN 20 MG PO TABS
20.0000 mg | ORAL_TABLET | Freq: Every evening | ORAL | 2 refills | Status: DC
  Filled 2024-01-23: qty 90, 90d supply, fill #0
  Filled 2024-04-21 (×2): qty 90, 90d supply, fill #1
  Filled 2024-07-13: qty 90, 90d supply, fill #2

## 2024-01-23 MED ORDER — LEVOTHYROXINE SODIUM 75 MCG PO TABS
75.0000 ug | ORAL_TABLET | Freq: Every day | ORAL | 2 refills | Status: DC
  Filled 2024-01-23: qty 90, 90d supply, fill #0
  Filled 2024-04-21 (×2): qty 90, 90d supply, fill #1
  Filled 2024-07-13: qty 90, 90d supply, fill #2

## 2024-01-23 MED ORDER — VALSARTAN 40 MG PO TABS
40.0000 mg | ORAL_TABLET | Freq: Every day | ORAL | 1 refills | Status: DC
  Filled 2024-01-23: qty 90, 90d supply, fill #0

## 2024-01-29 ENCOUNTER — Encounter: Payer: PPO | Admitting: Internal Medicine

## 2024-01-31 ENCOUNTER — Encounter: Payer: Self-pay | Admitting: Internal Medicine

## 2024-01-31 NOTE — Patient Instructions (Addendum)
 Blood work was ordered.       Medications changes include :   decrease valsartan  to 20 mg daily    Monitor your BP at home    Return in about 6 months (around 08/03/2024) for follow up.   Health Maintenance, Female Adopting a healthy lifestyle and getting preventive care are important in promoting health and wellness. Ask your health care provider about: The right schedule for you to have regular tests and exams. Things you can do on your own to prevent diseases and keep yourself healthy. What should I know about diet, weight, and exercise? Eat a healthy diet  Eat a diet that includes plenty of vegetables, fruits, low-fat dairy products, and lean protein. Do not eat a lot of foods that are high in solid fats, added sugars, or sodium. Maintain a healthy weight Body mass index (BMI) is used to identify weight problems. It estimates body fat based on height and weight. Your health care provider can help determine your BMI and help you achieve or maintain a healthy weight. Get regular exercise Get regular exercise. This is one of the most important things you can do for your health. Most adults should: Exercise for at least 150 minutes each week. The exercise should increase your heart rate and make you sweat (moderate-intensity exercise). Do strengthening exercises at least twice a week. This is in addition to the moderate-intensity exercise. Spend less time sitting. Even light physical activity can be beneficial. Watch cholesterol and blood lipids Have your blood tested for lipids and cholesterol at 88 years of age, then have this test every 5 years. Have your cholesterol levels checked more often if: Your lipid or cholesterol levels are high. You are older than 88 years of age. You are at high risk for heart disease. What should I know about cancer screening? Depending on your health history and family history, you may need to have cancer screening at various ages. This  may include screening for: Breast cancer. Cervical cancer. Colorectal cancer. Skin cancer. Lung cancer. What should I know about heart disease, diabetes, and high blood pressure? Blood pressure and heart disease High blood pressure causes heart disease and increases the risk of stroke. This is more likely to develop in people who have high blood pressure readings or are overweight. Have your blood pressure checked: Every 3-5 years if you are 12-41 years of age. Every year if you are 40 years old or older. Diabetes Have regular diabetes screenings. This checks your fasting blood sugar level. Have the screening done: Once every three years after age 85 if you are at a normal weight and have a low risk for diabetes. More often and at a younger age if you are overweight or have a high risk for diabetes. What should I know about preventing infection? Hepatitis B If you have a higher risk for hepatitis B, you should be screened for this virus. Talk with your health care provider to find out if you are at risk for hepatitis B infection. Hepatitis C Testing is recommended for: Everyone born from [birth year] through 1963. Anyone with known risk factors for hepatitis C. Sexually transmitted infections (STIs) Get screened for STIs, including gonorrhea and chlamydia, if: You are sexually active and are younger than 88 years of age. You are older than 88 years of age and your health care provider tells you that you are at risk for this type of infection. Your sexual activity has changed since you were  last screened, and you are at increased risk for chlamydia or gonorrhea. Ask your health care provider if you are at risk. Ask your health care provider about whether you are at high risk for HIV. Your health care provider may recommend a prescription medicine to help prevent HIV infection. If you choose to take medicine to prevent HIV, you should first get tested for HIV. You should then be tested every 3  months for as long as you are taking the medicine. Pregnancy If you are about to stop having your period (premenopausal) and you may become pregnant, seek counseling before you get pregnant. Take 400 to 800 micrograms (mcg) of folic acid  every day if you become pregnant. Ask for birth control (contraception) if you want to prevent pregnancy. Osteoporosis and menopause Osteoporosis is a disease in which the bones lose minerals and strength with aging. This can result in bone fractures. If you are 75 years old or older, or if you are at risk for osteoporosis and fractures, ask your health care provider if you should: Be screened for bone loss. Take a calcium or vitamin D  supplement to lower your risk of fractures. Be given hormone replacement therapy (HRT) to treat symptoms of menopause. Follow these instructions at home: Alcohol use Do not drink alcohol if: Your health care provider tells you not to drink. You are pregnant, may be pregnant, or are planning to become pregnant. If you drink alcohol: Limit how much you have to: 0-1 drink a day. Know how much alcohol is in your drink. In the [place] one drink equals one 12 oz bottle of beer (355 mL), one 5 oz glass of wine (148 mL), or one 1 oz glass of hard liquor (44 mL). Lifestyle Do not use any products that contain nicotine or tobacco. These products include cigarettes, chewing tobacco, and vaping devices, such as e-cigarettes. If you need help quitting, ask your health care provider. Do not use street drugs. Do not share needles. Ask your health care provider for help if you need support or information about quitting drugs. General instructions Schedule regular health, dental, and eye exams. Stay current with your vaccines. Tell your health care provider if: You often feel depressed. You have ever been abused or do not feel safe at home. Summary Adopting a healthy lifestyle and getting preventive care are important in promoting health  and wellness. Follow your health care provider's instructions about healthy diet, exercising, and getting tested or screened for diseases. Follow your health care provider's instructions on monitoring your cholesterol and blood pressure. This information is not intended to replace advice given to you by your health care provider. Make sure you discuss any questions you have with your health care provider. Document Revised: 01/17/2021 Document Reviewed: 01/17/2021 Elsevier Patient Education  2024 [employer].

## 2024-01-31 NOTE — Progress Notes (Signed)
 Subjective:    Patient ID: Jessica Sherman, female    DOB: [birth year], 89 y.o.   MRN: [id number]      HPI Jessica Sherman is here for a Physical exam and her chronic medical problems.   Doing well.  Feels more tired - has less energy.     Medications and allergies reviewed with patient and updated if appropriate.  Current Outpatient Medications on File Prior to Visit  Medication Sig Dispense Refill   ALPRAZolam  (XANAX ) 0.25 MG tablet Take 1 tablet (0.25 mg total) by mouth 2 (two) times daily as needed for anxiety. 30 tablet 2   Calcium Carb-Cholecalciferol (CALCIUM 600+D3 PO) Take 1 tablet by mouth 2 (two) times daily.     Cholecalciferol (VITAMIN D3) 1000 UNITS CAPS Take 1,000 Units by mouth daily.     levothyroxine  (SYNTHROID ) 75 MCG tablet Take 1 tablet (75 mcg total) by mouth daily. 90 tablet 2   Multiple Vitamins-Minerals (PRESERVISION AREDS PO) Take by mouth in the morning and at bedtime.     simvastatin  (ZOCOR ) 20 MG tablet Take 1 tablet (20 mg total) by mouth at bedtime. 90 tablet 2   [DISCONTINUED] prochlorperazine  (COMPAZINE ) 10 MG tablet Take 1 tablet (10 mg total) by mouth every 6 (six) hours as needed (Nausea or vomiting). (Patient not taking: Reported on 07/27/2016) 30 tablet 1   No current facility-administered medications on file prior to visit.    Review of Systems  Constitutional:  Negative for fever.       Low energy  Eyes:  Negative for visual disturbance.  Respiratory:  Negative for cough, shortness of breath and wheezing.   Cardiovascular:  Negative for chest pain, palpitations and leg swelling.  Gastrointestinal:  Negative for abdominal pain, blood in stool, constipation and diarrhea.       No gerd  Genitourinary:  Negative for dysuria.  Musculoskeletal:  Positive for arthralgias (mild). Negative for back pain.  Skin:  Negative for rash.  Neurological:  Positive for light-headedness. Negative for headaches.       Balance is not as good   Psychiatric/Behavioral:  Negative for dysphoric mood. The patient is not nervous/anxious.        Objective:   Vitals:   02/01/24 1028  BP: 102/72   There were no vitals filed for this visit. There is no height or weight on file to calculate BMI.  BP Readings from Last 3 Encounters:  02/01/24 102/72  02/01/24 102/72  09/17/23 116/62    Wt Readings from Last 3 Encounters:  02/01/24 102 lb 3.2 oz (46.4 kg)  09/17/23 102 lb (46.3 kg)  08/21/23 106 lb (48.1 kg)       Physical Exam Constitutional: She appears well-developed and well-nourished. No distress.  HENT:  Head: Normocephalic and atraumatic.  Right Ear: External ear normal. Normal ear canal and TM Left Ear: External ear normal.  Normal ear canal and TM Mouth/Throat: Oropharynx is clear and moist.  Eyes: Conjunctivae normal.  Neck: Neck supple. No tracheal deviation present. No thyromegaly present.  No carotid bruit  Cardiovascular: Normal rate, regular rhythm and normal heart sounds.   No murmur heard.  No edema. Pulmonary/Chest: Effort normal and breath sounds normal. No respiratory distress. She has no wheezes. She has no rales.  Breast: deferred   Abdominal: Soft. She exhibits no distension. There is no tenderness.  Lymphadenopathy: She has no cervical adenopathy.  Skin: Skin is warm and dry. She is not diaphoretic.  Psychiatric: She has a normal mood  and affect. Her behavior is normal.     Lab Results  Component Value Date   WBC 6.7 08/01/2023   HGB 12.2 08/01/2023   HCT 34.6 (L) 08/01/2023   PLT 265.0 08/01/2023   GLUCOSE 142 (H) 08/07/2023   CHOL 141 08/01/2023   TRIG 122.0 08/01/2023   HDL 63.70 08/01/2023   LDLDIRECT 57.0 01/27/2019   LDLCALC 52 08/01/2023   ALT 14 08/01/2023   AST 20 08/01/2023   NA 130 (L) 08/07/2023   K 4.5 08/07/2023   CL 95 (L) 08/07/2023   CREATININE 0.97 08/07/2023   BUN 22 08/07/2023   CO2 28 08/07/2023   TSH 1.03 08/01/2023   INR 0.9 04/17/2019   HGBA1C 6.1  08/01/2023         Assessment & Plan:   Physical exam: Screening blood work  ordered Exercise  walking some Weight  stable Substance abuse  none   Reviewed recommended immunizations.   Health Maintenance  Topic Date Due   DTaP/Tdap/Td (2 - Tdap) 01/08/2020   COVID-19 Vaccine (5 - Pfizer risk 2024-25 season) 11/21/2023   DEXA SCAN  06/22/2054 (Originally 04/18/2019)   INFLUENZA VACCINE  04/11/2024   Medicare Annual Wellness (AWV)  01/31/2025   Pneumonia Vaccine 63+ Years old  Completed   Zoster Vaccines- Shingrix  Completed   HPV VACCINES  Aged Out   Meningococcal B Vaccine  Aged Out          See Problem List for Assessment and Plan of chronic medical problems.

## 2024-02-01 ENCOUNTER — Ambulatory Visit (INDEPENDENT_AMBULATORY_CARE_PROVIDER_SITE_OTHER)

## 2024-02-01 ENCOUNTER — Ambulatory Visit (INDEPENDENT_AMBULATORY_CARE_PROVIDER_SITE_OTHER): Payer: PPO | Admitting: Internal Medicine

## 2024-02-01 VITALS — BP 102/72 | HR 72 | Ht 62.0 in | Wt 102.2 lb

## 2024-02-01 VITALS — BP 102/72

## 2024-02-01 DIAGNOSIS — F419 Anxiety disorder, unspecified: Secondary | ICD-10-CM | POA: Diagnosis not present

## 2024-02-01 DIAGNOSIS — R7303 Prediabetes: Secondary | ICD-10-CM | POA: Diagnosis not present

## 2024-02-01 DIAGNOSIS — E538 Deficiency of other specified B group vitamins: Secondary | ICD-10-CM | POA: Diagnosis not present

## 2024-02-01 DIAGNOSIS — E782 Mixed hyperlipidemia: Secondary | ICD-10-CM

## 2024-02-01 DIAGNOSIS — E559 Vitamin D deficiency, unspecified: Secondary | ICD-10-CM

## 2024-02-01 DIAGNOSIS — Z0000 Encounter for general adult medical examination without abnormal findings: Secondary | ICD-10-CM

## 2024-02-01 DIAGNOSIS — E039 Hypothyroidism, unspecified: Secondary | ICD-10-CM | POA: Diagnosis not present

## 2024-02-01 DIAGNOSIS — I10 Essential (primary) hypertension: Secondary | ICD-10-CM

## 2024-02-01 DIAGNOSIS — M8589 Other specified disorders of bone density and structure, multiple sites: Secondary | ICD-10-CM

## 2024-02-01 DIAGNOSIS — E871 Hypo-osmolality and hyponatremia: Secondary | ICD-10-CM

## 2024-02-01 LAB — CBC WITH DIFFERENTIAL/PLATELET
Basophils Absolute: 0 10*3/uL (ref 0.0–0.1)
Basophils Relative: 0.7 % (ref 0.0–3.0)
Eosinophils Absolute: 0.1 10*3/uL (ref 0.0–0.7)
Eosinophils Relative: 0.9 % (ref 0.0–5.0)
HCT: 36.9 % (ref 36.0–46.0)
Hemoglobin: 12.4 g/dL (ref 12.0–15.0)
Lymphocytes Relative: 11.9 % — ABNORMAL LOW (ref 12.0–46.0)
Lymphs Abs: 0.8 10*3/uL (ref 0.7–4.0)
MCHC: 33.5 g/dL (ref 30.0–36.0)
MCV: 97.8 fl (ref 78.0–100.0)
Monocytes Absolute: 0.8 10*3/uL (ref 0.1–1.0)
Monocytes Relative: 12 % (ref 3.0–12.0)
Neutro Abs: 5.1 10*3/uL (ref 1.4–7.7)
Neutrophils Relative %: 74.5 % (ref 43.0–77.0)
Platelets: 267 10*3/uL (ref 150.0–400.0)
RBC: 3.77 Mil/uL — ABNORMAL LOW (ref 3.87–5.11)
RDW: 12.3 % (ref 11.5–15.5)
WBC: 6.9 10*3/uL (ref 4.0–10.5)

## 2024-02-01 LAB — LIPID PANEL
Cholesterol: 160 mg/dL (ref 0–200)
HDL: 77.5 mg/dL (ref >39.00)
LDL Cholesterol: 67 mg/dL (ref 0–99)
NonHDL: 82.95
Total CHOL/HDL Ratio: 2
Triglycerides: 79 mg/dL (ref 0.0–149.0)
VLDL: 15.8 mg/dL (ref 0.0–40.0)

## 2024-02-01 LAB — COMPREHENSIVE METABOLIC PANEL WITH GFR
ALT: 14 U/L (ref 0–35)
AST: 20 U/L (ref 0–37)
Albumin: 4.4 g/dL (ref 3.5–5.2)
Alkaline Phosphatase: 46 U/L (ref 39–117)
BUN: 16 mg/dL (ref 6–23)
CO2: 32 meq/L (ref 19–32)
Calcium: 9.6 mg/dL (ref 8.4–10.5)
Chloride: 93 meq/L — ABNORMAL LOW (ref 96–112)
Creatinine, Ser: 0.67 mg/dL (ref 0.40–1.20)
GFR: 76.44 mL/min (ref >60.00)
Glucose, Bld: 88 mg/dL (ref 70–99)
Potassium: 4.6 meq/L (ref 3.5–5.1)
Sodium: 130 meq/L — ABNORMAL LOW (ref 135–145)
Total Bilirubin: 0.5 mg/dL (ref 0.2–1.2)
Total Protein: 7.4 g/dL (ref 6.0–8.3)

## 2024-02-01 LAB — HEMOGLOBIN A1C: Hgb A1c MFr Bld: 6 % (ref 4.6–6.5)

## 2024-02-01 LAB — VITAMIN B12: Vitamin B-12: 376 pg/mL (ref 211–911)

## 2024-02-01 LAB — VITAMIN D 25 HYDROXY (VIT D DEFICIENCY, FRACTURES): VITD: 35.51 ng/mL (ref 30.00–100.00)

## 2024-02-01 LAB — TSH: TSH: 0.54 u[IU]/mL (ref 0.35–5.50)

## 2024-02-01 MED ORDER — VALSARTAN 40 MG PO TABS: 20.0000 mg | ORAL_TABLET | Freq: Every day | ORAL | Status: AC

## 2024-02-01 NOTE — Assessment & Plan Note (Signed)
 Chronic Controlled Continue alprazolam  0.25 mg twice daily as needed

## 2024-02-01 NOTE — Assessment & Plan Note (Signed)
 Chronic dexa up to date Taking calcium, vit d Exercises regularly Check vitamin D  level

## 2024-02-01 NOTE — Assessment & Plan Note (Signed)
 Chronic Lab Results  Component Value Date   HGBA1C 6.1 08/01/2023    Check a1c

## 2024-02-01 NOTE — Assessment & Plan Note (Addendum)
 Chronic Blood pressure well controlled CBC, CMP Decrease valsartan  to 20 mg daily Monitor BP at home

## 2024-02-01 NOTE — Assessment & Plan Note (Signed)
 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

## 2024-02-01 NOTE — Patient Instructions (Signed)
 Jessica Sherman , Thank you for taking time out of your busy schedule to complete your Annual Wellness Visit with me. I enjoyed our conversation and look forward to speaking with you again next year. I, as well as your care team,  appreciate your ongoing commitment to your health goals. Please review the following plan we discussed and let me know if I can assist you in the future. Your Game plan/ To Do List    Referrals: If you haven't heard from the office you've been referred to, please reach out to them at the phone provided.  I have placed a [employer] referral today for a Life alert.  Listen out for someone to call you about the Life alert. Follow up Visits: Next Medicare AWV with our clinical staff: 5/27/*2026.   Have you seen your provider in the last 6 months (3 months if uncontrolled diabetes)? Yes Next Office Visit with your provider: 02/01/2024.  Clinician Recommendations:  Aim for 30 minutes of exercise or brisk walking, 6-8 glasses of water, and 5 servings of fruits and vegetables each day. You are due for a tetanus vaccine.  Keep up the good work.      This is a list of the screening recommended for you and due dates:  Health Maintenance  Topic Date Due   DTaP/Tdap/Td vaccine (2 - Tdap) 01/08/2020   COVID-19 Vaccine (5 - Pfizer risk 2024-25 season) 11/21/2023   DEXA scan (bone density measurement)  06/22/2054*   Flu Shot  04/11/2024   Medicare Annual Wellness Visit  04/18/2024   Pneumonia Vaccine  Completed   Zoster (Shingles) Vaccine  Completed   HPV Vaccine  Aged Out   Meningitis B Vaccine  Aged Out  *Topic was postponed. The date shown is not the original due date.    Advanced directives: (In Chart) A copy of your advanced directives are scanned into your chart should your provider ever need it. Advance Care Planning is important because it:  [x]  Makes sure you receive the medical care that is consistent with your values, goals, and preferences  [x]  It provides  guidance to your family and loved ones and reduces their decisional burden about whether or not they are making the right decisions based on your wishes.  Follow the link provided in your after visit summary or read over the paperwork we have mailed to you to help you started getting your Advance Directives in place. If you need assistance in completing these, please reach out to us  so that we can help you!  See attachments for Preventive Care and Fall Prevention Tips.

## 2024-02-01 NOTE — Assessment & Plan Note (Signed)
Chronic  Clinically euthyroid Check tsh and will titrate med dose if needed Currently taking levothyroxine 75 mcg daily  

## 2024-02-01 NOTE — Progress Notes (Signed)
 Subjective:   Jessica Sherman is a 89 y.o. who presents for a Medicare Wellness preventive visit.  As a reminder, Annual Wellness Visits don't include a physical exam, and some assessments may be limited, especially if this visit is performed virtually. We may recommend an in-person follow-up visit with your provider if needed.  Visit Complete: In person  Persons Participating in Visit: Patient.  AWV Questionnaire: No: Patient Medicare AWV questionnaire was not completed prior to this visit.  Cardiac Risk Factors include: advanced age (>42men, >29 women);hypertension;dyslipidemia;Other (see comment), Risk factor comments: Aortic atherosclerosis     Objective:     Today's Vitals   02/01/24 0928  Pulse: 72  SpO2: 100%  Weight: 102 lb 3.2 oz (46.4 kg)  Height: 5\' 2"  (1.575 m)   Body mass index is 18.69 kg/m.     02/01/2024    9:21 AM 04/19/2023    1:10 PM 12/29/2021    2:35 PM 11/04/2021    2:33 PM 05/23/2021   10:33 AM 12/10/2020    2:56 PM 10/29/2020    2:29 PM  Advanced Directives  Does Patient Have a Medical Advance Directive? Yes Yes Yes Yes Yes Yes Yes  Type of Estate agent of [place];Living will Healthcare Power of [place];Living will Healthcare Power of [place];Living will Healthcare Power of [place];Living will Healthcare Power of [place];Living will Living will;Healthcare Power of [employer] Power of [place];Living will  Does patient want to make changes to medical advance directive? No - Patient declined No - Patient declined No - Patient declined No - Patient declined  No - Patient declined No - Patient declined  Copy of Healthcare Power of Attorney in Chart? Yes - validated most recent copy scanned in chart (See row information) Yes - validated most recent copy scanned in chart (See row information) Yes - validated most recent copy scanned in chart (See row information) Yes - validated most recent copy scanned in chart (See row  information) Yes - validated most recent copy scanned in chart (See row information) No - copy requested Yes - validated most recent copy scanned in chart (See row information)    Current Medications (verified) Outpatient Encounter Medications as of 02/01/2024  Medication Sig   ALPRAZolam  (XANAX ) 0.25 MG tablet Take 1 tablet (0.25 mg total) by mouth 2 (two) times daily as needed for anxiety.   Calcium Carb-Cholecalciferol (CALCIUM 600+D3 PO) Take 1 tablet by mouth 2 (two) times daily.   Cholecalciferol (VITAMIN D3) 1000 UNITS CAPS Take 1,000 Units by mouth daily.   levothyroxine  (SYNTHROID ) 75 MCG tablet Take 1 tablet (75 mcg total) by mouth daily.   Multiple Vitamins-Minerals (PRESERVISION AREDS PO) Take by mouth in the morning and at bedtime.   simvastatin  (ZOCOR ) 20 MG tablet Take 1 tablet (20 mg total) by mouth at bedtime.   valsartan  (DIOVAN ) 40 MG tablet Take 1 tablet (40 mg total) by mouth daily.   [DISCONTINUED] prochlorperazine  (COMPAZINE ) 10 MG tablet Take 1 tablet (10 mg total) by mouth every 6 (six) hours as needed (Nausea or vomiting). (Patient not taking: Reported on 07/27/2016)   No facility-administered encounter medications on file as of 02/01/2024.    Allergies (verified) Other   History: Past Medical History:  Diagnosis Date   Hx of colonic polyps    Hyperlipidemia    Hypothyroidism    Macular degeneration, dry    Osteopenia    spine BMD T score - 1.69   PONV (postoperative nausea and vomiting)  pt has vertigo and can not lie flat   Prediabetes    FBS 136  on 08/29/2010; A1c 6.2%   Tongue cancer (HCC)    Vertigo    Past Surgical History:  Procedure Laterality Date   CATARACT EXTRACTION  2009   OS   COLONOSCOPY W/ POLYPECTOMY     X 2; last 2012. Dr Lavaughn [place]   IR GASTROSTOMY TUBE MOD SED  04/17/2019   IR GASTROSTOMY TUBE REMOVAL  08/29/2019   IR GENERIC HISTORICAL  05/09/2016   IR US  GUIDE VASC ACCESS RIGHT 05/09/2016 Fernando Hoyer, MD [facility]   IR  GENERIC HISTORICAL  05/09/2016   IR FLUORO GUIDE CV LINE RIGHT 05/09/2016 Fernando Hoyer, MD [facility]   MODIFIED MASTECTOMY Left 10/18/2016   Procedure: LEFT MODIFIED RADICAL MASTECTOMY;  Surgeon: Oza Blumenthal, MD;  Location: [facility];  Service: General;  Laterality: Left;   PILONIDAL CYST EXCISION     X 3   TONSILLECTOMY AND ADENOIDECTOMY     TOTAL ABDOMINAL HYSTERECTOMY     w/ bladder repair   Family History  Problem Relation Age of Onset   Osteoporosis Mother    Leukemia Mother    Aneurysm Father        AAA   Dementia Sister    Cancer Maternal Uncle        ? stomach   Heart attack Neg Hx    Social History   Socioeconomic History   Marital status: Widowed    Spouse name: Not on file   Number of children: 4   Years of education: Not on file   Highest education level: Not on file  Occupational History   Occupation: retired  Tobacco Use   Smoking status: Never    Passive exposure: Past   Smokeless tobacco: Never  Vaping Use   Vaping status: Never Used  Substance and Sexual Activity   Alcohol use: Yes    Comment: occasional glass of wine or beer   Drug use: No   Sexual activity: Not on file  Other Topics Concern   Not on file  Social History Narrative   Regular exercise- yes w/o symptoms    Son lives with her/2025   Social Drivers of Health   Financial Resource Strain: Low Risk  (02/01/2024)   Overall Financial Resource Strain (CARDIA)    Difficulty of Paying Living Expenses: Not hard at all  Food Insecurity: No Food Insecurity (02/01/2024)   Hunger Vital Sign    Worried About Running Out of Food in the Last Year: Never true    Ran Out of Food in the Last Year: Never true  Transportation Needs: No Transportation Needs (02/01/2024)   PRAPARE - Administrator, Civil Service (Medical): No    Lack of Transportation (Non-Medical): No  Physical Activity: Inactive (02/01/2024)   Exercise Vital Sign    Days of Exercise per Week:  0 days    Minutes of Exercise per Session: 0 min  Stress: Stress Concern Present (02/01/2024)   [employer] of Occupational Health - Occupational Stress Questionnaire    Feeling of Stress : To some extent  Social Connections: Moderately Integrated (02/01/2024)   Social Connection and Isolation Panel [NHANES]    Frequency of Communication with Friends and Family: More than three times a week    Frequency of Social Gatherings with Friends and Family: More than three times a week    Attends Religious Services: More than 4 times per year  Active Member of Clubs or Organizations: Yes    Attends Banker Meetings: Never    Marital Status: Widowed    Tobacco Counseling Counseling given: Not Answered    Clinical Intake:  Pre-visit preparation completed: Yes  Pain : No/denies pain     BMI - recorded: 18.69 Nutritional Status: BMI <19  Underweight Nutritional Risks: None Diabetes: No  Lab Results  Component Value Date   HGBA1C 6.1 08/01/2023   HGBA1C 6.0 01/31/2023   HGBA1C 6.2 06/30/2022     How often do you need to have someone help you when you read instructions, pamphlets, or other written materials from your doctor or pharmacy?: 1 - Never  Interpreter Needed?: No  Information entered by :: Elvis Laufer, RMA   Activities of Daily Living     02/01/2024    9:21 AM 04/19/2023    1:07 PM  In your present state of health, do you have any difficulty performing the following activities:  Hearing? 1 1  Comment has hearing aides but does not wear them   Vision? 0 0  Difficulty concentrating or making decisions? 0 0  Walking or climbing stairs? 0   Dressing or bathing? 0 0  Doing errands, shopping? 0 0  Comment pt still driving /not at night   Preparing Food and eating ? N N  Using the Toilet? N N  In the past six months, have you accidently leaked urine? N   Do you have problems with loss of bowel control? N   Managing your Medications? N   Managing  your Finances? N   Housekeeping or managing your Housekeeping? N     Patient Care Team: Colene Dauphin, MD as PCP - General (Internal Medicine) Cameron Cea, MD as Consulting Physician (Hematology and Oncology) Bensimhon, Rheta Celestine, MD as Consulting Physician (Cardiology) Oza Blumenthal, MD as Consulting Physician (General Surgery) Colie Dawes, MD as Attending Physician (Radiation Oncology) Lilia Reges, CCC-SLP as Speech Language Pathologist (Speech Pathology) Elana Grayer, RD as Dietitian (Nutrition) Alvina Axon, MD as Consulting Physician (Ophthalmology) Virgina Grills, MD as Consulting Physician (Otolaryngology) Szabat, Tino Foreman, [facility] (Inactive) as Pharmacist (Pharmacist)  Indicate any recent Medical Services you may have received from other than [facility] providers in the past year (date may be approximate).     Assessment:    This is a routine wellness examination for [place].  Hearing/Vision screen Hearing Screening - Comments:: Has hearing aides but does not wear them Vision Screening - Comments:: Wears eyeglasses/Dr. Terrall Ferraris   Goals Addressed             This Visit's Progress    My goal is to gain some weight because I have a high metabolism.   On track      Depression Screen     02/01/2024    9:36 AM 09/17/2023    3:38 PM 04/19/2023    1:15 PM 06/30/2022    1:58 PM 12/29/2021    2:50 PM 12/28/2021    2:08 PM 12/10/2020    3:13 PM  PHQ 2/9 Scores  PHQ - 2 Score 0 0 0 0 0 0 0  PHQ- 9 Score 1  0 0       Fall Risk     02/01/2024    9:33 AM 09/17/2023    3:38 PM 04/19/2023    1:10 PM 06/30/2022    1:57 PM 05/29/2022    1:53 PM  Fall Risk   Falls in the past year?  0 0 0 0 0  Number falls in past yr: 0 0 0 0 0  Injury with Fall? 0 0 0 0 0  Risk for fall due to :  No Fall Risks No Fall Risks  No Fall Risks  Follow up Falls evaluation completed;Falls prevention discussed Falls evaluation completed Falls prevention discussed Falls evaluation completed Falls  evaluation completed    MEDICARE RISK AT HOME:  Medicare Risk at Home Any stairs in or around the home?: Yes (6 steps) If so, are there any without handrails?: Yes Home free of loose throw rugs in walkways, pet beds, electrical cords, etc?: Yes Adequate lighting in your home to reduce risk of falls?: Yes Life alert?: No (would like one) Use of a cane, walker or w/c?: No Grab bars in the bathroom?: No Shower chair or bench in shower?: No Elevated toilet seat or a handicapped toilet?: No  TIMED UP AND GO:  Was the test performed?  Yes  Length of time to ambulate 10 feet: 30 sec Gait slow and steady without use of assistive device  Cognitive Function: 6CIT completed        02/01/2024   10:12 AM 04/19/2023    1:11 PM 12/29/2021    2:56 PM  6CIT Screen  What Year? 0 points 0 points 0 points  What month? 0 points 0 points 0 points  What time? 0 points 0 points 0 points  Count back from 20 0 points 0 points 0 points  Months in reverse 0 points 0 points 0 points  Repeat phrase 0 points 2 points 0 points  Total Score 0 points 2 points 0 points    Immunizations Immunization History  Administered Date(s) Administered   Fluad Quad(high Dose 65+) 05/23/2019, 05/27/2020, 06/24/2021, 05/29/2022   Influenza Split 06/21/2011   Influenza Whole 09/12/1999, 07/06/2005, 06/18/2008, 06/22/2010   Influenza, High Dose Seasonal PF 07/08/2013, 06/09/2017, 06/01/2018, 04/23/2023   Influenza,inj,Quad PF,6+ Mos 05/23/2016   Influenza-Unspecified 11/20/2014, 06/12/2015, 06/09/2017, 06/01/2018   PFIZER(Purple Top)SARS-COV-2 Vaccination 10/16/2019, 11/10/2019, 06/09/2020   Pfizer(Comirnaty)Fall Seasonal Vaccine 12 years and older 05/24/2023   Pneumococcal Conjugate-13 10/08/2015   Pneumococcal Polysaccharide-23 07/17/2002, 07/31/2013   Td 01/07/2010   Zoster Recombinant(Shingrix) 05/02/2018, 07/01/2018    Screening Tests Health Maintenance  Topic Date Due   DTaP/Tdap/Td (2 - Tdap) 01/08/2020    COVID-19 Vaccine (5 - Pfizer risk 2024-25 season) 11/21/2023   DEXA SCAN  06/22/2054 (Originally 04/18/2019)   INFLUENZA VACCINE  04/11/2024   Medicare Annual Wellness (AWV)  01/31/2025   Pneumonia Vaccine 71+ Years old  Completed   Zoster Vaccines- Shingrix  Completed   HPV VACCINES  Aged Out   Meningococcal B Vaccine  Aged Out    Health Maintenance  Health Maintenance Due  Topic Date Due   DTaP/Tdap/Td (2 - Tdap) 01/08/2020   COVID-19 Vaccine (5 - Pfizer risk 2024-25 season) 11/21/2023   Health Maintenance Items Addressed: UACR (Urine Albumin:Creatinine Ratio)  Additional Screening:  Vision Screening: Recommended annual ophthalmology exams for early detection of glaucoma and other disorders of the eye.  Dental Screening: Recommended annual dental exams for proper oral hygiene  Community Resource Referral / Chronic Care Management: CRR required this visit?  Yes   CCM required this visit?  No   Plan:    I have personally reviewed and noted the following in the patient's chart:   Medical and social history Use of alcohol, tobacco or illicit drugs  Current medications and supplements including opioid prescriptions. Patient is not currently  taking opioid prescriptions. Functional ability and status Nutritional status Physical activity Advanced directives List of other physicians Hospitalizations, surgeries, and ER visits in previous 12 months Vitals Screenings to include cognitive, depression, and falls Referrals and appointments  In addition, I have reviewed and discussed with patient certain preventive protocols, quality metrics, and best practice recommendations. A written personalized care plan for preventive services as well as general preventive health recommendations were provided to patient.   Rumi Taras L Raymel Cull, CMA   02/01/2024   After Visit Summary: (MyChart) Due to this being a telephonic visit, the after visit summary with patients personalized plan was  offered to patient via MyChart   Notes: Please refer to Routing Comments.

## 2024-02-01 NOTE — Assessment & Plan Note (Signed)
 Chronic Ran out of vitamins and needs to get more-stressed taking them on a regular basis Check B12 level

## 2024-02-01 NOTE — Assessment & Plan Note (Signed)
 Chronic Taking vitamin d daily Check vitamin d level

## 2024-02-01 NOTE — Assessment & Plan Note (Signed)
Chronic Regular exercise and healthy diet encouraged Check lipid panel, CMP Continue simvastatin 20 mg daily 

## 2024-02-03 ENCOUNTER — Ambulatory Visit: Payer: Self-pay | Admitting: Internal Medicine

## 2024-02-14 ENCOUNTER — Telehealth: Payer: Self-pay | Admitting: *Deleted

## 2024-02-14 NOTE — Progress Notes (Signed)
 Complex Care Management Note Care Guide Note  02/14/2024 Name: Jessica Sherman MRN: [id number] DOB: March 31, 1931  Jessica Sherman is a 89 y.o. year old female who is a primary care patient of Burns, Beckey Bourgeois, MD . The community resource team was consulted for assistance with life alerts   SDOH screenings and interventions completed:  Yes     SDOH Interventions Today    Flowsheet Row Most Recent Value  SDOH Interventions   Financial Strain Interventions Intervention Not Indicated  [Provided a listing of life alert services to patient]        Care guide performed the following interventions: Patient provided with information about care guide support team and interviewed to confirm resource needs.  Follow Up Plan:  No further follow up planned at this time. The patient has been provided with needed resources.  Encounter Outcome:  Patient Visit Completed Heidy Mccubbin Greenauer-Moran  [facility] HealthPopulation Health Care Guide  Direct [phone] Fax:[phone] Website: [email or web address]

## 2024-03-12 DIAGNOSIS — H353221 Exudative age-related macular degeneration, left eye, with active choroidal neovascularization: Secondary | ICD-10-CM | POA: Diagnosis not present

## 2024-04-21 ENCOUNTER — Other Ambulatory Visit (HOSPITAL_COMMUNITY): Payer: Self-pay

## 2024-04-21 ENCOUNTER — Other Ambulatory Visit (HOSPITAL_BASED_OUTPATIENT_CLINIC_OR_DEPARTMENT_OTHER): Payer: Self-pay

## 2024-04-21 ENCOUNTER — Other Ambulatory Visit: Payer: Self-pay

## 2024-04-21 ENCOUNTER — Other Ambulatory Visit: Payer: Self-pay | Admitting: Internal Medicine

## 2024-04-21 MED ORDER — VALSARTAN 40 MG PO TABS
40.0000 mg | ORAL_TABLET | Freq: Every day | ORAL | 1 refills | Status: DC
  Filled 2024-04-21: qty 90, 90d supply, fill #0

## 2024-04-25 ENCOUNTER — Encounter: Payer: Self-pay | Admitting: Internal Medicine

## 2024-04-25 ENCOUNTER — Ambulatory Visit (INDEPENDENT_AMBULATORY_CARE_PROVIDER_SITE_OTHER): Admitting: Internal Medicine

## 2024-04-25 ENCOUNTER — Other Ambulatory Visit (HOSPITAL_COMMUNITY): Payer: Self-pay

## 2024-04-25 ENCOUNTER — Ambulatory Visit: Payer: Self-pay | Admitting: Internal Medicine

## 2024-04-25 ENCOUNTER — Ambulatory Visit: Admitting: Internal Medicine

## 2024-04-25 VITALS — BP 168/82 | HR 74 | Temp 98.0°F | Ht 62.0 in | Wt 105.8 lb

## 2024-04-25 DIAGNOSIS — J0120 Acute ethmoidal sinusitis, unspecified: Secondary | ICD-10-CM

## 2024-04-25 DIAGNOSIS — R739 Hyperglycemia, unspecified: Secondary | ICD-10-CM

## 2024-04-25 DIAGNOSIS — R7303 Prediabetes: Secondary | ICD-10-CM | POA: Diagnosis not present

## 2024-04-25 DIAGNOSIS — E538 Deficiency of other specified B group vitamins: Secondary | ICD-10-CM | POA: Diagnosis not present

## 2024-04-25 DIAGNOSIS — H6123 Impacted cerumen, bilateral: Secondary | ICD-10-CM | POA: Diagnosis not present

## 2024-04-25 DIAGNOSIS — Z8639 Personal history of other endocrine, nutritional and metabolic disease: Secondary | ICD-10-CM

## 2024-04-25 DIAGNOSIS — I10 Essential (primary) hypertension: Secondary | ICD-10-CM | POA: Diagnosis not present

## 2024-04-25 DIAGNOSIS — E559 Vitamin D deficiency, unspecified: Secondary | ICD-10-CM | POA: Diagnosis not present

## 2024-04-25 LAB — IBC PANEL
Iron: 95 ug/dL (ref 42–145)
Saturation Ratios: 23.9 % (ref 20.0–50.0)
TIBC: 397.6 ug/dL (ref 250.0–450.0)
Transferrin: 284 mg/dL (ref 212.0–360.0)

## 2024-04-25 LAB — HEPATIC FUNCTION PANEL
ALT: 18 U/L (ref 0–35)
AST: 19 U/L (ref 0–37)
Albumin: 3.9 g/dL (ref 3.5–5.2)
Alkaline Phosphatase: 46 U/L (ref 39–117)
Bilirubin, Direct: 0.1 mg/dL (ref 0.0–0.3)
Total Bilirubin: 0.4 mg/dL (ref 0.2–1.2)
Total Protein: 6.6 g/dL (ref 6.0–8.3)

## 2024-04-25 LAB — HEMOGLOBIN A1C: Hgb A1c MFr Bld: 6.3 % (ref 4.6–6.5)

## 2024-04-25 LAB — CBC WITH DIFFERENTIAL/PLATELET
Basophils Absolute: 0 K/uL (ref 0.0–0.1)
Basophils Relative: 0.6 % (ref 0.0–3.0)
Eosinophils Absolute: 0 K/uL (ref 0.0–0.7)
Eosinophils Relative: 0.6 % (ref 0.0–5.0)
HCT: 33.8 % — ABNORMAL LOW (ref 36.0–46.0)
Hemoglobin: 11.3 g/dL — ABNORMAL LOW (ref 12.0–15.0)
Lymphocytes Relative: 9.7 % — ABNORMAL LOW (ref 12.0–46.0)
Lymphs Abs: 0.7 K/uL (ref 0.7–4.0)
MCHC: 33.4 g/dL (ref 30.0–36.0)
MCV: 97.3 fl (ref 78.0–100.0)
Monocytes Absolute: 0.7 K/uL (ref 0.1–1.0)
Monocytes Relative: 10 % (ref 3.0–12.0)
Neutro Abs: 5.4 K/uL (ref 1.4–7.7)
Neutrophils Relative %: 79.1 % — ABNORMAL HIGH (ref 43.0–77.0)
Platelets: 199 K/uL (ref 150.0–400.0)
RBC: 3.47 Mil/uL — ABNORMAL LOW (ref 3.87–5.11)
RDW: 12.6 % (ref 11.5–15.5)
WBC: 6.8 K/uL (ref 4.0–10.5)

## 2024-04-25 LAB — BASIC METABOLIC PANEL WITH GFR
BUN: 21 mg/dL (ref 6–23)
CO2: 27 meq/L (ref 19–32)
Calcium: 9 mg/dL (ref 8.4–10.5)
Chloride: 95 meq/L — ABNORMAL LOW (ref 96–112)
Creatinine, Ser: 0.89 mg/dL (ref 0.40–1.20)
GFR: 56.61 mL/min — ABNORMAL LOW (ref >60.00)
Glucose, Bld: 104 mg/dL — ABNORMAL HIGH (ref 70–99)
Potassium: 4.4 meq/L (ref 3.5–5.1)
Sodium: 129 meq/L — ABNORMAL LOW (ref 135–145)

## 2024-04-25 LAB — FERRITIN: Ferritin: 86.8 ng/mL (ref 10.0–291.0)

## 2024-04-25 LAB — VITAMIN D 25 HYDROXY (VIT D DEFICIENCY, FRACTURES): VITD: 41.19 ng/mL (ref 30.00–100.00)

## 2024-04-25 LAB — VITAMIN B12: Vitamin B-12: 353 pg/mL (ref 211–911)

## 2024-04-25 LAB — TSH: TSH: 1.1 u[IU]/mL (ref 0.35–5.50)

## 2024-04-25 MED ORDER — AMOXICILLIN 400 MG/5ML PO SUSR
400.0000 mg | Freq: Two times a day (BID) | ORAL | 0 refills | Status: DC
  Filled 2024-04-25 – 2024-04-26 (×2): qty 100, 10d supply, fill #0

## 2024-04-25 NOTE — Patient Instructions (Signed)
 Please take all new medication as prescribed - the amoxil  liquid  You can also take Delsym OTC for cough, and/or Mucinex (or it's generic off brand) for congestion, and tylenol  as needed for pain.  Please continue all other medications as before, and refills have been done if requested.  Please have the pharmacy call with any other refills you may need.  Please keep your appointments with your specialists as you may have planned - ENT for the wax removal  Please go to the LAB at the blood drawing area for the tests to be done  You will be contacted by phone if any changes need to be made immediately.  Otherwise, you will receive a letter about your results with an explanation, but please check with MyChart first.

## 2024-04-25 NOTE — Progress Notes (Signed)
 Patient ID: Jessica Sherman, female   DOB: Jan 20, 1931, 89 y.o.   MRN: [id number]        Chief Complaint: follow up sinusitis, fatigue, bilateral cerumen impaction, low b12 , PreDM       HPI:  Jessica Sherman is a 89 y.o. female   Here with 2-3 days acute onset fever, facial pain, pressure, headache, general weakness and malaise, and greenish d/c, with mild ST and cough, but pt denies chest pain, wheezing, increased sob or doe, orthopnea, PND, increased LE swelling, palpitations, dizziness or syncope.  Does c/o worsening fatigue and weakness, but denies signficant daytime hypersomnolence.Does also have reduced hearing with finding of bilateral cerumen impactions.   Pt denies polydipsia, polyuria, or new focal neuro s/s.    Pt denies fever, wt loss, night sweats, loss of appetite, or other constitutional symptoms  Pt asking for lab evaluation today as well.        Wt Readings from Last 3 Encounters:  04/25/24 105 lb 12.8 oz (48 kg)  02/01/24 102 lb 3.2 oz (46.4 kg)  09/17/23 102 lb (46.3 kg)   BP Readings from Last 3 Encounters:  04/25/24 (!) 168/82  02/01/24 102/72  02/01/24 102/72         Past Medical History:  Diagnosis Date   Hx of colonic polyps    Hyperlipidemia    Hypothyroidism    Macular degeneration, dry    Osteopenia    spine BMD T score - 1.69   PONV (postoperative nausea and vomiting)    pt has vertigo and can not lie flat   Prediabetes    FBS 136  on 08/29/2010; A1c 6.2%   Tongue cancer (HCC)    Vertigo    Past Surgical History:  Procedure Laterality Date   CATARACT EXTRACTION  2009   OS   COLONOSCOPY W/ POLYPECTOMY     X 2; last 2012. Dr Rosalie   IR GASTROSTOMY TUBE MOD SED  04/17/2019   IR GASTROSTOMY TUBE REMOVAL  08/29/2019   IR GENERIC HISTORICAL  05/09/2016   IR US  GUIDE VASC ACCESS RIGHT 05/09/2016 Wilkie Lent, MD [facility]   IR GENERIC HISTORICAL  05/09/2016   IR FLUORO GUIDE CV LINE RIGHT 05/09/2016 Wilkie Lent, MD [facility]    MODIFIED MASTECTOMY Left 10/18/2016   Procedure: LEFT MODIFIED RADICAL MASTECTOMY;  Surgeon: Vicenta Poli, MD;  Location: [facility];  Service: General;  Laterality: Left;   PILONIDAL CYST EXCISION     X 3   TONSILLECTOMY AND ADENOIDECTOMY     TOTAL ABDOMINAL HYSTERECTOMY     w/ bladder repair    reports that she has never smoked. She has been exposed to tobacco smoke. She has never used smokeless tobacco. She reports current alcohol use. She reports that she does not use drugs. family history includes Aneurysm in her father; Cancer in her maternal uncle; Dementia in her sister; Leukemia in her mother; Osteoporosis in her mother. Allergies  Allergen Reactions   Other Nausea And Vomiting    Anesthesia makes me very sick, Please pre-medicate to control nausea and vomiting   Current Outpatient Medications on File Prior to Visit  Medication Sig Dispense Refill   ALPRAZolam  (XANAX ) 0.25 MG tablet Take 1 tablet (0.25 mg total) by mouth 2 (two) times daily as needed for anxiety. 30 tablet 2   Calcium  Carb-Cholecalciferol  (CALCIUM  600+D3 PO) Take 1 tablet by mouth 2 (two) times daily.     Cholecalciferol  (VITAMIN D3) 1000 UNITS CAPS  Take 1,000 Units by mouth daily.     levothyroxine  (SYNTHROID ) 75 MCG tablet Take 1 tablet (75 mcg total) by mouth daily. 90 tablet 2   Multiple Vitamins-Minerals (PRESERVISION AREDS PO) Take by mouth in the morning and at bedtime.     simvastatin  (ZOCOR ) 20 MG tablet Take 1 tablet (20 mg total) by mouth at bedtime. 90 tablet 2   valsartan  (DIOVAN ) 40 MG tablet Take 0.5 tablets (20 mg total) by mouth daily.     valsartan  (DIOVAN ) 40 MG tablet Take 1 tablet (40 mg total) by mouth daily. 90 tablet 1   [DISCONTINUED] prochlorperazine  (COMPAZINE ) 10 MG tablet Take 1 tablet (10 mg total) by mouth every 6 (six) hours as needed (Nausea or vomiting). (Patient not taking: Reported on 07/27/2016) 30 tablet 1   No current facility-administered medications on  file prior to visit.        ROS:  All others reviewed and negative.  Objective        PE:  BP (!) 168/82   Pulse 74   Temp 98 F (36.7 C)   Ht 5' 2 (1.575 m)   Wt 105 lb 12.8 oz (48 kg)   SpO2 97%   BMI 19.35 kg/m                 Constitutional: Pt appears mild ill               HENT: Head: NCAT.                Right Ear: External ear normal.                 Left Ear: External ear normal. Bilat tm's with mild erythema.  Max sinus areas mild tender.  Pharynx with mild erythema, no exudate                 Eyes: . Pupils are equal, round, and reactive to light. Conjunctivae and EOM are normal               Nose: without d/c or deformity               Neck: Neck supple. Gross normal ROM               Cardiovascular: Normal rate and regular rhythm.                 Pulmonary/Chest: Effort normal and breath sounds without rales or wheezing.                               Neurological: Pt is alert. At baseline orientation, motor grossly intact               Skin: Skin is warm. No rashes, no other new lesions, LE edema - none               Psychiatric: Pt behavior is normal without agitation   Micro: none  Cardiac tracings I have personally interpreted today:  none  Pertinent Radiological findings (summarize): none   Lab Results  Component Value Date   WBC 6.8 04/25/2024   HGB 11.3 (L) 04/25/2024   HCT 33.8 (L) 04/25/2024   PLT 199.0 04/25/2024   GLUCOSE 104 (H) 04/25/2024   CHOL 160 02/01/2024   TRIG 79.0 02/01/2024   HDL 77.50 02/01/2024   LDLDIRECT 57.0 01/27/2019   LDLCALC 67 02/01/2024   ALT 18 04/25/2024  AST 19 04/25/2024   NA 129 (L) 04/25/2024   K 4.4 04/25/2024   CL 95 (L) 04/25/2024   CREATININE 0.89 04/25/2024   BUN 21 04/25/2024   CO2 27 04/25/2024   TSH 1.10 04/25/2024   INR 0.9 04/17/2019   HGBA1C 6.3 04/25/2024   Assessment/Plan:  Jessica Sherman is a 89 y.o. White or Caucasian [1] female with  has a past medical history of colonic polyps,  Hyperlipidemia, Hypothyroidism, Macular degeneration, dry, Osteopenia, PONV (postoperative nausea and vomiting), Prediabetes, Tongue cancer (HCC), and Vertigo.  Bilateral impacted cerumen With recent worsening, chronic recurrent, pt declines irrigation here, but states will f/u with her ENT she has seen for this in the past  Vitamin D  deficiency Last vitamin D  Lab Results  Component Value Date   VD25OH 41.19 04/25/2024   Stable, cont oral replacement   Sinusitis Mild to mod, for antibx course amoxil  liquid due to difficulty with pills,  to f/u any worsening symptoms or concerns  Prediabetes Lab Results  Component Value Date   HGBA1C 6.3 04/25/2024   Stable, pt to continue current medical treatment  - diet, wt control   Low serum vitamin B12 Lab Results  Component Value Date   VITAMINB12 353 04/25/2024   Stable, cont oral replacement - b12 1000 mcg qd   Hypertension BP Readings from Last 3 Encounters:  04/25/24 (!) 168/82  02/01/24 102/72  02/01/24 102/72   Uncontrolled, likely reactive to illness, pt to continue medical treatment diovan  40 gm qd   History of iron deficiency Also for lab f/u today  Followup: Return if symptoms worsen or fail to improve.  Lynwood Rush, MD 04/26/2024 12:00 PM  [facility] Internal Medicine

## 2024-04-25 NOTE — Progress Notes (Signed)
 The test results show that your current treatment is OK, as the tests are stable.  Please continue the same plan.  There is no other need for change of treatment or further evaluation based on these results, at this time.  thanks

## 2024-04-26 ENCOUNTER — Encounter: Payer: Self-pay | Admitting: Internal Medicine

## 2024-04-26 ENCOUNTER — Other Ambulatory Visit (HOSPITAL_COMMUNITY): Payer: Self-pay

## 2024-04-26 NOTE — Assessment & Plan Note (Signed)
 With recent worsening, chronic recurrent, pt declines irrigation here, but states will f/u with her ENT she has seen for this in the past

## 2024-04-26 NOTE — Assessment & Plan Note (Signed)
 Lab Results  Component Value Date   VITAMINB12 353 04/25/2024   Stable, cont oral replacement - b12 1000 mcg qd

## 2024-04-26 NOTE — Assessment & Plan Note (Signed)
 Lab Results  Component Value Date   HGBA1C 6.3 04/25/2024   Stable, pt to continue current medical treatment  - diet, wt control

## 2024-04-26 NOTE — Assessment & Plan Note (Signed)
 Also for lab f/u today

## 2024-04-26 NOTE — Assessment & Plan Note (Signed)
 Last vitamin D  Lab Results  Component Value Date   VD25OH 41.19 04/25/2024   Stable, cont oral replacement

## 2024-04-26 NOTE — Assessment & Plan Note (Signed)
 Mild to mod, for antibx course amoxil  liquid due to difficulty with pills,  to f/u any worsening symptoms or concerns

## 2024-04-26 NOTE — Assessment & Plan Note (Signed)
 BP Readings from Last 3 Encounters:  04/25/24 (!) 168/82  02/01/24 102/72  02/01/24 102/72   Uncontrolled, likely reactive to illness, pt to continue medical treatment diovan  40 gm qd

## 2024-04-30 ENCOUNTER — Other Ambulatory Visit: Payer: Self-pay | Admitting: Internal Medicine

## 2024-05-01 ENCOUNTER — Other Ambulatory Visit: Payer: Self-pay

## 2024-05-01 ENCOUNTER — Other Ambulatory Visit (HOSPITAL_COMMUNITY): Payer: Self-pay

## 2024-05-01 MED ORDER — ALPRAZOLAM 0.25 MG PO TABS
0.2500 mg | ORAL_TABLET | Freq: Two times a day (BID) | ORAL | 2 refills | Status: AC | PRN
  Filled 2024-05-01 – 2024-05-03 (×3): qty 30, 15d supply, fill #0

## 2024-05-02 ENCOUNTER — Other Ambulatory Visit: Payer: Self-pay

## 2024-05-02 ENCOUNTER — Other Ambulatory Visit (HOSPITAL_COMMUNITY): Payer: Self-pay

## 2024-05-02 DIAGNOSIS — R42 Dizziness and giddiness: Secondary | ICD-10-CM | POA: Diagnosis not present

## 2024-05-02 DIAGNOSIS — R682 Dry mouth, unspecified: Secondary | ICD-10-CM | POA: Diagnosis not present

## 2024-05-02 DIAGNOSIS — Z85810 Personal history of malignant neoplasm of tongue: Secondary | ICD-10-CM | POA: Diagnosis not present

## 2024-05-02 DIAGNOSIS — H6123 Impacted cerumen, bilateral: Secondary | ICD-10-CM | POA: Diagnosis not present

## 2024-05-02 MED ORDER — MECLIZINE HCL 25 MG PO TABS
25.0000 mg | ORAL_TABLET | Freq: Three times a day (TID) | ORAL | 2 refills | Status: AC | PRN
  Filled 2024-05-02 – 2024-05-03 (×2): qty 40, 14d supply, fill #0

## 2024-05-03 ENCOUNTER — Other Ambulatory Visit (HOSPITAL_COMMUNITY): Payer: Self-pay

## 2024-05-05 ENCOUNTER — Other Ambulatory Visit (HOSPITAL_COMMUNITY): Payer: Self-pay

## 2024-05-05 ENCOUNTER — Other Ambulatory Visit: Payer: Self-pay | Admitting: Otolaryngology

## 2024-05-05 ENCOUNTER — Other Ambulatory Visit: Payer: Self-pay

## 2024-05-05 DIAGNOSIS — R42 Dizziness and giddiness: Secondary | ICD-10-CM

## 2024-05-06 ENCOUNTER — Inpatient Hospital Stay (HOSPITAL_COMMUNITY)
Admission: EM | Admit: 2024-05-06 | Discharge: 2024-05-08 | DRG: 243 | Disposition: A | Discharge status: Home / Self Care | Attending: Family Medicine | Admitting: Family Medicine

## 2024-05-06 ENCOUNTER — Emergency Department (HOSPITAL_COMMUNITY)

## 2024-05-06 DIAGNOSIS — I441 Atrioventricular block, second degree: Principal | ICD-10-CM

## 2024-05-06 DIAGNOSIS — I442 Atrioventricular block, complete: Principal | ICD-10-CM | POA: Diagnosis present

## 2024-05-06 DIAGNOSIS — R7303 Prediabetes: Secondary | ICD-10-CM | POA: Diagnosis present

## 2024-05-06 DIAGNOSIS — Z79890 Hormone replacement therapy (postmenopausal): Secondary | ICD-10-CM | POA: Diagnosis not present

## 2024-05-06 DIAGNOSIS — E871 Hypo-osmolality and hyponatremia: Secondary | ICD-10-CM | POA: Diagnosis not present

## 2024-05-06 DIAGNOSIS — I160 Hypertensive urgency: Secondary | ICD-10-CM | POA: Diagnosis not present

## 2024-05-06 DIAGNOSIS — I517 Cardiomegaly: Secondary | ICD-10-CM | POA: Diagnosis not present

## 2024-05-06 DIAGNOSIS — F419 Anxiety disorder, unspecified: Secondary | ICD-10-CM | POA: Diagnosis present

## 2024-05-06 DIAGNOSIS — R404 Transient alteration of awareness: Secondary | ICD-10-CM | POA: Diagnosis not present

## 2024-05-06 DIAGNOSIS — Z79899 Other long term (current) drug therapy: Secondary | ICD-10-CM

## 2024-05-06 DIAGNOSIS — Z923 Personal history of irradiation: Secondary | ICD-10-CM

## 2024-05-06 DIAGNOSIS — Z85810 Personal history of malignant neoplasm of tongue: Secondary | ICD-10-CM

## 2024-05-06 DIAGNOSIS — R54 Age-related physical debility: Secondary | ICD-10-CM | POA: Diagnosis not present

## 2024-05-06 DIAGNOSIS — Z66 Do not resuscitate: Secondary | ICD-10-CM | POA: Diagnosis present

## 2024-05-06 DIAGNOSIS — Z853 Personal history of malignant neoplasm of breast: Secondary | ICD-10-CM | POA: Diagnosis not present

## 2024-05-06 DIAGNOSIS — Z8262 Family history of osteoporosis: Secondary | ICD-10-CM

## 2024-05-06 DIAGNOSIS — Z806 Family history of leukemia: Secondary | ICD-10-CM

## 2024-05-06 DIAGNOSIS — Z860100 Personal history of colon polyps, unspecified: Secondary | ICD-10-CM | POA: Diagnosis not present

## 2024-05-06 DIAGNOSIS — M8580 Other specified disorders of bone density and structure, unspecified site: Secondary | ICD-10-CM | POA: Diagnosis present

## 2024-05-06 DIAGNOSIS — J984 Other disorders of lung: Secondary | ICD-10-CM | POA: Diagnosis not present

## 2024-05-06 DIAGNOSIS — Z950 Presence of cardiac pacemaker: Secondary | ICD-10-CM | POA: Diagnosis not present

## 2024-05-06 DIAGNOSIS — E039 Hypothyroidism, unspecified: Secondary | ICD-10-CM | POA: Diagnosis not present

## 2024-05-06 DIAGNOSIS — Z7401 Bed confinement status: Secondary | ICD-10-CM | POA: Diagnosis not present

## 2024-05-06 DIAGNOSIS — R55 Syncope and collapse: Secondary | ICD-10-CM | POA: Diagnosis not present

## 2024-05-06 DIAGNOSIS — Z90710 Acquired absence of both cervix and uterus: Secondary | ICD-10-CM | POA: Diagnosis not present

## 2024-05-06 DIAGNOSIS — Z681 Body mass index (BMI) 19 or less, adult: Secondary | ICD-10-CM | POA: Diagnosis not present

## 2024-05-06 DIAGNOSIS — Z723 Lack of physical exercise: Secondary | ICD-10-CM

## 2024-05-06 DIAGNOSIS — I700 Atherosclerosis of aorta: Secondary | ICD-10-CM | POA: Diagnosis not present

## 2024-05-06 DIAGNOSIS — Z9012 Acquired absence of left breast and nipple: Secondary | ICD-10-CM

## 2024-05-06 DIAGNOSIS — R001 Bradycardia, unspecified: Secondary | ICD-10-CM

## 2024-05-06 DIAGNOSIS — I453 Trifascicular block: Secondary | ICD-10-CM | POA: Diagnosis present

## 2024-05-06 DIAGNOSIS — E785 Hyperlipidemia, unspecified: Secondary | ICD-10-CM | POA: Diagnosis not present

## 2024-05-06 DIAGNOSIS — I10 Essential (primary) hypertension: Secondary | ICD-10-CM | POA: Diagnosis present

## 2024-05-06 DIAGNOSIS — I4510 Unspecified right bundle-branch block: Secondary | ICD-10-CM | POA: Diagnosis present

## 2024-05-06 DIAGNOSIS — R531 Weakness: Secondary | ICD-10-CM | POA: Diagnosis not present

## 2024-05-06 DIAGNOSIS — Z888 Allergy status to other drugs, medicaments and biological substances status: Secondary | ICD-10-CM

## 2024-05-06 LAB — CBC
HCT: 37.1 % (ref 36.0–46.0)
Hemoglobin: 11.9 g/dL — ABNORMAL LOW (ref 12.0–15.0)
MCH: 31.3 pg (ref 26.0–34.0)
MCHC: 32.1 g/dL (ref 30.0–36.0)
MCV: 97.6 fL (ref 80.0–100.0)
Platelets: 296 K/uL (ref 150–400)
RBC: 3.8 MIL/uL — ABNORMAL LOW (ref 3.87–5.11)
RDW: 11.9 % (ref 11.5–15.5)
WBC: 10.2 K/uL (ref 4.0–10.5)
nRBC: 0 % (ref 0.0–0.2)

## 2024-05-06 LAB — BASIC METABOLIC PANEL WITH GFR
Anion gap: 14 (ref 5–15)
BUN: 18 mg/dL (ref 8–23)
CO2: 24 mmol/L (ref 22–32)
Calcium: 9.5 mg/dL (ref 8.9–10.3)
Chloride: 88 mmol/L — ABNORMAL LOW (ref 98–111)
Creatinine, Ser: 0.73 mg/dL (ref 0.44–1.00)
GFR, Estimated: >60 mL/min (ref >60)
Glucose, Bld: 128 mg/dL — ABNORMAL HIGH (ref 70–99)
Potassium: 5.1 mmol/L (ref 3.5–5.1)
Sodium: 125 mmol/L — ABNORMAL LOW (ref 135–145)

## 2024-05-06 LAB — HEPATIC FUNCTION PANEL
ALT: 20 U/L (ref 0–44)
AST: 29 U/L (ref 15–41)
Albumin: 3.8 g/dL (ref 3.5–5.0)
Alkaline Phosphatase: 46 U/L (ref 38–126)
Bilirubin, Direct: 0.1 mg/dL (ref 0.0–0.2)
Indirect Bilirubin: 0.3 mg/dL (ref 0.3–0.9)
Total Bilirubin: 0.5 mg/dL (ref 0.0–1.2)
Total Protein: 6.3 g/dL — ABNORMAL LOW (ref 6.5–8.1)

## 2024-05-06 LAB — PHOSPHORUS: Phosphorus: 2.9 mg/dL (ref 2.5–4.6)

## 2024-05-06 LAB — SODIUM: Sodium: 125 mmol/L — ABNORMAL LOW (ref 135–145)

## 2024-05-06 LAB — MAGNESIUM: Magnesium: 2.2 mg/dL (ref 1.7–2.4)

## 2024-05-06 LAB — TROPONIN T, HIGH SENSITIVITY: Troponin T High Sensitivity: 33 ng/L — ABNORMAL HIGH (ref 0–19)

## 2024-05-06 MED ORDER — SENNOSIDES-DOCUSATE SODIUM 8.6-50 MG PO TABS: 1.0000 | ORAL_TABLET | Freq: Every evening | ORAL | Status: DC | PRN

## 2024-05-06 MED ORDER — SODIUM CHLORIDE 0.9 % IV BOLUS: 500.0000 mL | Freq: Once | INTRAVENOUS | Status: DC

## 2024-05-06 MED ORDER — IRBESARTAN 75 MG PO TABS
37.5000 mg | ORAL_TABLET | Freq: Every day | ORAL | Status: DC
  Administered 2024-05-07 – 2024-05-08 (×2): 37.5 mg via ORAL
  Filled 2024-05-06 (×2): qty 0.5

## 2024-05-06 MED ORDER — ONDANSETRON HCL 4 MG/2ML IJ SOLN
4.0000 mg | Freq: Four times a day (QID) | INTRAMUSCULAR | Status: DC | PRN
Start: 2024-05-06 — End: 2024-05-06

## 2024-05-06 MED ORDER — OYSTER SHELL CALCIUM/D3 500-5 MG-MCG PO TABS
1.0000 | ORAL_TABLET | Freq: Two times a day (BID) | ORAL | Status: DC
  Administered 2024-05-07 – 2024-05-08 (×2): 1 via ORAL
  Filled 2024-05-06 (×4): qty 1

## 2024-05-06 MED ORDER — ACETAMINOPHEN 325 MG PO TABS
650.0000 mg | ORAL_TABLET | Freq: Four times a day (QID) | ORAL | Status: DC | PRN
  Administered 2024-05-07: 650 mg via ORAL
  Filled 2024-05-06: qty 2

## 2024-05-06 MED ORDER — BISACODYL 5 MG PO TBEC: 5.0000 mg | DELAYED_RELEASE_TABLET | Freq: Every day | ORAL | Status: DC | PRN

## 2024-05-06 MED ORDER — ENOXAPARIN SODIUM 40 MG/0.4ML IJ SOSY: 40.0000 mg | PREFILLED_SYRINGE | INTRAMUSCULAR | Status: DC

## 2024-05-06 MED ORDER — VITAMIN D 25 MCG (1000 UNIT) PO TABS
1000.0000 [IU] | ORAL_TABLET | Freq: Every day | ORAL | Status: DC
  Administered 2024-05-08: 1000 [IU] via ORAL
  Filled 2024-05-06: qty 1

## 2024-05-06 MED ORDER — ONDANSETRON HCL 4 MG PO TABS
4.0000 mg | ORAL_TABLET | Freq: Four times a day (QID) | ORAL | Status: DC | PRN
Start: 2024-05-06 — End: 2024-05-06

## 2024-05-06 MED ORDER — LEVOTHYROXINE SODIUM 75 MCG PO TABS
75.0000 ug | ORAL_TABLET | Freq: Every day | ORAL | Status: DC
  Administered 2024-05-07 – 2024-05-08 (×2): 75 ug via ORAL
  Filled 2024-05-06 (×2): qty 1

## 2024-05-06 MED ORDER — SIMVASTATIN 20 MG PO TABS
20.0000 mg | ORAL_TABLET | Freq: Every day | ORAL | Status: DC
  Administered 2024-05-07: 20 mg via ORAL
  Filled 2024-05-06: qty 1

## 2024-05-06 MED ORDER — SODIUM CHLORIDE 0.9 % IV BOLUS
500.0000 mL | Freq: Once | INTRAVENOUS | Status: AC
  Administered 2024-05-06: 500 mL via INTRAVENOUS

## 2024-05-06 MED ORDER — ACETAMINOPHEN 650 MG RE SUPP: 650.0000 mg | Freq: Four times a day (QID) | RECTAL | Status: DC | PRN

## 2024-05-06 MED ORDER — CALCIUM CARB-CHOLECALCIFEROL 600-10 MG-MCG PO TABS: ORAL_TABLET | Freq: Two times a day (BID) | ORAL | Status: DC

## 2024-05-06 NOTE — ED Triage Notes (Signed)
 Patient had a syncopal episode while in bed. She reports dizziness, HX vertigo, bradycardia  EMS vitals: 172/72 BP 46 HR 98% SPO2 on room air 110 CBG

## 2024-05-06 NOTE — ED Provider Notes (Signed)
 [place] EMERGENCY DEPARTMENT AT [facility] Provider Note   CSN: [id number] Arrival date & time: 05/06/24  1845     Patient presents with: Bradycardia   Jessica Sherman is a 89 y.o. female.   HPI Patient presents with dizziness and syncopal episode.  States she was sitting watching TV and passed out.  Has been dealing with a lot of anxiety recently.  Saw PCP and started on Xanax .  Had a half dose last night and another dose today.  No chest pain.  No trouble breathing.  Initial EKG showed the patient. 2 to 1 heart block.  States she has not had this before.  Reviewing notes.  Does have a history of hypothyroidism but TSH was reassuring 11 years ago.     Past Medical History:  Diagnosis Date   Hx of colonic polyps    Hyperlipidemia    Hypothyroidism    Macular degeneration, dry    Osteopenia    spine BMD T score - 1.69   PONV (postoperative nausea and vomiting)    pt has vertigo and can not lie flat   Prediabetes    FBS 136  on 08/29/2010; A1c 6.2%   Tongue cancer (HCC)    Vertigo     Prior to Admission medications   Medication Sig Start Date End Date Taking? Authorizing Provider  ALPRAZolam  (XANAX ) 0.25 MG tablet Take 1 tablet (0.25 mg total) by mouth 2 (two) times daily as needed for anxiety. 05/01/24   Geofm Glade PARAS, MD  amoxicillin  (AMOXIL ) 400 MG/5ML suspension Take 5 mLs (400 mg total) by mouth 2 (two) times daily for 10 days. 04/25/24 05/06/24  Norleen Lynwood ORN, MD  Calcium  Carb-Cholecalciferol  (CALCIUM  600+D3 PO) Take 1 tablet by mouth 2 (two) times daily.    [provider]  Cholecalciferol  (VITAMIN D3) 1000 UNITS CAPS Take 1,000 Units by mouth daily.    [provider]  levothyroxine  (SYNTHROID ) 75 MCG tablet Take 1 tablet (75 mcg total) by mouth daily. 01/23/24   Geofm Glade PARAS, MD  meclizine  (ANTIVERT ) 25 MG tablet Take 1 tablet (25 mg total) by mouth every 8 (eight) hours as needed for dizziness 05/02/24     Multiple  Vitamins-Minerals (PRESERVISION AREDS PO) Take by mouth in the morning and at bedtime.    [provider]  simvastatin  (ZOCOR ) 20 MG tablet Take 1 tablet (20 mg total) by mouth at bedtime. 01/23/24   Geofm Glade PARAS, MD  valsartan  (DIOVAN ) 40 MG tablet Take 0.5 tablets (20 mg total) by mouth daily. 02/01/24   Geofm Glade PARAS, MD  valsartan  (DIOVAN ) 40 MG tablet Take 1 tablet (40 mg total) by mouth daily. 04/21/24   Geofm Glade PARAS, MD  prochlorperazine  (COMPAZINE ) 10 MG tablet Take 1 tablet (10 mg total) by mouth every 6 (six) hours as needed (Nausea or vomiting). Patient not taking: Reported on 07/27/2016 05/01/16 09/18/16  Odean Potts, MD    Allergies: Other    Review of Systems  Updated Vital Signs BP (!) 186/53   Pulse (!) 39   Temp 98.6 F (37 C)   Resp 13   SpO2 100%   Physical Exam Vitals and nursing note reviewed.  HENT:     Head: Normocephalic.  Cardiovascular:     Rate and Rhythm: Bradycardia present.  Pulmonary:     Breath sounds: No wheezing.  Abdominal:     Tenderness: There is no abdominal tenderness.  Musculoskeletal:        General:  No tenderness.  Skin:    Capillary Refill: Capillary refill takes less than 2 seconds.  Neurological:     Mental Status: She is alert and oriented to person, place, and time.     (all labs ordered are listed, but only abnormal results are displayed) Labs Reviewed  BASIC METABOLIC PANEL WITH GFR - Abnormal; Notable for the following components:      Result Value   Sodium 125 (*)    Chloride 88 (*)    Glucose, Bld 128 (*)    All other components within normal limits  CBC - Abnormal; Notable for the following components:   RBC 3.80 (*)    Hemoglobin 11.9 (*)    All other components within normal limits  MAGNESIUM     EKG: EKG Interpretation Date/Time:  Tuesday May 06 2024 18:59:46 EDT Ventricular Rate:  43 PR Interval:  206 QRS Duration:  128 QT Interval:  656 QTC Calculation: 555 R Axis:   -63  Text  Interpretation: 2:1 heart block Right bundle branch block LVH with IVCD and secondary repol abnrm Prolonged QT interval Confirmed by Patsey Lot [phone]) on 05/06/2024 7:11:32 PM  Radiology: No results found.   Procedures   Medications Ordered in the ED - No data to display                                  Medical Decision Making Amount and/or Complexity of Data Reviewed Labs: ordered. Radiology: ordered.   Patient with dizziness syncopal episode.  Found to be in 2-1 heart block.  This is likely the cause.  Not on definite AV nodal blocking agents.  However did have Xanax .  Hypothyroid also considered but recent TSH normal.  ZOLL pads placed in room.  Will discuss with cardiology.  Will check electrolytes.  Patient is somewhat hypertensive.  Has had recent adjustments of her blood pressure medicines.  No chest pain.  No headache.  States she does feel anxious at this time.   Discussed with Dr. Loni from cardiology.  Will require transfer [facility].  Ideally should be admitted but if there is a delay would need ER to ER.  Blood work reassuring.  Will discuss with hospitalist for admission.  CRITICAL CARE Performed by: Lot Patsey Total critical care time: 30 minutes Critical care time was exclusive of separately billable procedures and treating other patients. Critical care was necessary to treat or prevent imminent or life-threatening deterioration. Critical care was time spent personally by me on the following activities: development of treatment plan with patient and/or surrogate as well as nursing, discussions with consultants, evaluation of patient's response to treatment, examination of patient, obtaining history from patient or surrogate, ordering and performing treatments and interventions, ordering and review of laboratory studies, ordering and review of radiographic studies, pulse oximetry and re-evaluation of patient's condition.      Final diagnoses:   Second degree heart block    ED Discharge Orders     None          Patsey Lot, MD 05/06/24 2107

## 2024-05-06 NOTE — Consult Note (Signed)
 Cardiology Consultation   Patient ID: [name] MRN: [id number]; DOB: 05-04-1931  Admit date: 05/06/2024 Date of Consult: 05/06/2024  PCP:  Geofm Glade PARAS, MD   [facility] Providers Cardiologist:  Previously Bensimhon - not seen since 2018 Click here to update MD or APP on Care Team, Refresh:1}     Patient Profile: Jessica Sherman is a 89 y.o. female with a hx of HTN, HLD, hypothyroidism, breast cancer treated with herceptin  with stable echocardiograms who is being seen 05/06/2024 for the evaluation of 2 to 1 AV block at the request of ***.  History of Present Illness: Jessica Sherman was previously followed by Dr. Bensimhon for echo surveillance of heart function on Herceptin  therapy for left sided breast cancer. Her last appointment was in 2018 at which time echocardiograms did not show any evidence of therapy related cardiotoxicity.  She presented today to the [facility] ED for evaluation after syncopal episode while in a seated position watching TV. Notably she was recently prescribed Xanax  for anxiety which she took a half dose of yesterday and an additional dose today.   Preliminary ECG revealed 2 to 1 AV block with HR of 43 bpm, tri-fascicular block (1st degree AVB, RBBB, LAFB), and LVH.   Past Medical History:  Diagnosis Date   Hx of colonic polyps    Hyperlipidemia    Hypothyroidism    Macular degeneration, dry    Osteopenia    spine BMD T score - 1.69   PONV (postoperative nausea and vomiting)    pt has vertigo and can not lie flat   Prediabetes    FBS 136  on 08/29/2010; A1c 6.2%   Tongue cancer (HCC)    Vertigo     Past Surgical History:  Procedure Laterality Date   CATARACT EXTRACTION  2009   OS   COLONOSCOPY W/ POLYPECTOMY     X 2; last 2012. Dr Rosalie   IR GASTROSTOMY TUBE MOD SED  04/17/2019   IR GASTROSTOMY TUBE REMOVAL  08/29/2019   IR GENERIC HISTORICAL  05/09/2016   IR US  GUIDE VASC ACCESS RIGHT 05/09/2016 Wilkie Lent, MD [facility]  RAD   IR GENERIC HISTORICAL  05/09/2016   IR FLUORO GUIDE CV LINE RIGHT 05/09/2016 Wilkie Lent, MD [facility] RAD   MODIFIED MASTECTOMY Left 10/18/2016   Procedure: LEFT MODIFIED RADICAL MASTECTOMY;  Surgeon: Vicenta Poli, MD;  Location: [facility];  Service: General;  Laterality: Left;   PILONIDAL CYST EXCISION     X 3   TONSILLECTOMY AND ADENOIDECTOMY     TOTAL ABDOMINAL HYSTERECTOMY     w/ bladder repair     Home Medications:  Prior to Admission medications   Medication Sig Start Date End Date Taking? Authorizing Provider  ALPRAZolam  (XANAX ) 0.25 MG tablet Take 1 tablet (0.25 mg total) by mouth 2 (two) times daily as needed for anxiety. 05/01/24   Geofm Glade PARAS, MD  amoxicillin  (AMOXIL ) 400 MG/5ML suspension Take 5 mLs (400 mg total) by mouth 2 (two) times daily for 10 days. 04/25/24 05/06/24  Norleen Lynwood ORN, MD  Calcium  Carb-Cholecalciferol  (CALCIUM  600+D3 PO) Take 1 tablet by mouth 2 (two) times daily.    [provider]  Cholecalciferol  (VITAMIN D3) 1000 UNITS CAPS Take 1,000 Units by mouth daily.    [provider]  levothyroxine  (SYNTHROID ) 75 MCG tablet Take 1 tablet (75 mcg total) by mouth daily. 01/23/24   Geofm Glade PARAS, MD  meclizine  (ANTIVERT ) 25 MG tablet Take 1 tablet (25  mg total) by mouth every 8 (eight) hours as needed for dizziness 05/02/24     Multiple Vitamins-Minerals (PRESERVISION AREDS PO) Take by mouth in the morning and at bedtime.    [provider]  simvastatin  (ZOCOR ) 20 MG tablet Take 1 tablet (20 mg total) by mouth at bedtime. 01/23/24   Geofm Glade PARAS, MD  valsartan  (DIOVAN ) 40 MG tablet Take 0.5 tablets (20 mg total) by mouth daily. 02/01/24   Geofm Glade PARAS, MD  valsartan  (DIOVAN ) 40 MG tablet Take 1 tablet (40 mg total) by mouth daily. 04/21/24   Geofm Glade PARAS, MD  prochlorperazine  (COMPAZINE ) 10 MG tablet Take 1 tablet (10 mg total) by mouth every 6 (six) hours as needed (Nausea or vomiting). Patient not taking:  Reported on 07/27/2016 05/01/16 09/18/16  Gudena, Vinay, MD    Scheduled Meds:  Continuous Infusions:  PRN Meds:   Allergies:    Allergies  Allergen Reactions   Other Nausea And Vomiting    Anesthesia makes me very sick, Please pre-medicate to control nausea and vomiting    Social History:   Social History   Socioeconomic History   Marital status: Widowed    Spouse name: Not on file   Number of children: 4   Years of education: Not on file   Highest education level: Not on file  Occupational History   Occupation: retired  Tobacco Use   Smoking status: Never    Passive exposure: Past   Smokeless tobacco: Never  Vaping Use   Vaping status: Never Used  Substance and Sexual Activity   Alcohol use: Yes    Comment: occasional glass of wine or beer   Drug use: No   Sexual activity: Not on file  Other Topics Concern   Not on file  Social History Narrative   Regular exercise- yes w/o symptoms    Son lives with her/2025   Social Drivers of Health   Financial Resource Strain: Low Risk  (02/01/2024)   Overall Financial Resource Strain (CARDIA)    Difficulty of Paying Living Expenses: Not hard at all  Food Insecurity: No Food Insecurity (02/01/2024)   Hunger Vital Sign    Worried About Running Out of Food in the Last Year: Never true    Ran Out of Food in the Last Year: Never true  Transportation Needs: No Transportation Needs (02/01/2024)   PRAPARE - Administrator, Civil Service (Medical): No    Lack of Transportation (Non-Medical): No  Physical Activity: Inactive (02/01/2024)   Exercise Vital Sign    Days of Exercise per Week: 0 days    Minutes of Exercise per Session: 0 min  Stress: Stress Concern Present (02/01/2024)   [employer] of Occupational Health - Occupational Stress Questionnaire    Feeling of Stress : To some extent  Social Connections: Moderately Integrated (02/01/2024)   Social Connection and Isolation Panel    Frequency of  Communication with Friends and Family: More than three times a week    Frequency of Social Gatherings with Friends and Family: More than three times a week    Attends Religious Services: More than 4 times per year    Active Member of [employer] or Organizations: Yes    Attends Banker Meetings: Never    Marital Status: Widowed  Intimate Partner Violence: Patient Unable To Answer (02/01/2024)   Humiliation, Afraid, Rape, and Kick questionnaire    Fear of Current or Ex-Partner: Patient unable to answer  Emotionally Abused: Patient unable to answer    Physically Abused: Patient unable to answer    Sexually Abused: Patient unable to answer    Family History:    Family History  Problem Relation Age of Onset   Osteoporosis Mother    Leukemia Mother    Aneurysm Father        AAA   Dementia Sister    Cancer Maternal Uncle        ? stomach   Heart attack Neg Hx      ROS:  Please see the history of present illness.   All other ROS reviewed and negative.     Physical Exam/Data: Vitals:   05/06/24 1852 05/06/24 2030  BP: (!) 218/69 (!) 186/53  Pulse: (!) 38 (!) 39  Resp: 16 13  Temp: 97.9 F (36.6 C) 98.6 F (37 C)  TempSrc: Oral   SpO2: 98% 100%   No intake or output data in the 24 hours ending 05/06/24 2100    04/25/2024    2:32 PM 02/01/2024    9:28 AM 09/17/2023    3:30 PM  Last 3 Weights  Weight (lbs) 105 lb 12.8 oz 102 lb 3.2 oz 102 lb  Weight (kg) 47.991 kg 46.358 kg 46.267 kg     There is no height or weight on file to calculate BMI.  General:  Well nourished, well developed, in no acute distress HEENT: normal Neck: no JVD Vascular: No carotid bruits; Distal pulses 2+ bilaterally Cardiac:  normal S1, S2; RRR; no murmur  Lungs:  clear to auscultation bilaterally, no wheezing, rhonchi or rales  Abd: soft, nontender, no hepatomegaly  Ext: no edema Musculoskeletal:  No deformities, BUE and BLE strength normal and equal Skin: warm and dry  Neuro:  CNs 2-12  intact, no focal abnormalities noted Psych:  Normal affect   EKG:  The EKG was personally reviewed and demonstrates:  2 to 1 AV block, trifascicular block, LVH, ST changes Telemetry:  Telemetry was personally reviewed and demonstrates:  pending  Relevant CV Studies: Echo 2018 -  - Left ventricle: The cavity size was normal. There was mild focal    basal hypertrophy of the septum. Systolic function was normal.    The estimated ejection fraction was in the range of 55% to 60%.    Wall motion was normal; there were no regional wall motion    abnormalities. Doppler parameters are consistent with abnormal    left ventricular relaxation (grade 1 diastolic dysfunction).  - Aortic valve: There was trivial regurgitation.  - Mitral valve: There was mild regurgitation.  - Right ventricle: The cavity size was mildly dilated.  - Right atrium: The atrium was mildly dilated.  - Tricuspid valve: There was mild-moderate regurgitation.  - Pulmonary arteries: PA peak pressure: 66 mm Hg (S).  - Impressions: LS&' 9.3 GLS - 17.8%   Impressions:   - LS&' 9.3 GLS - 17.8%   Laboratory Data: High Sensitivity Troponin:  No results for input(s): TROPONINIHS in the last 720 hours.   ChemistryNo results for input(s): NA, K, CL, CO2, GLUCOSE, BUN, CREATININE, CALCIUM , MG, GFRNONAA, GFRAA, ANIONGAP in the last 168 hours.  No results for input(s): PROT, ALBUMIN, AST, ALT, ALKPHOS, BILITOT in the last 168 hours. Lipids No results for input(s): CHOL, TRIG, HDL, LABVLDL, LDLCALC, CHOLHDL in the last 168 hours.  Hematology Recent Labs  Lab 05/06/24 2013  WBC 10.2  RBC 3.80*  HGB 11.9*  HCT 37.1  MCV 97.6  MCH 31.3  MCHC 32.1  RDW 11.9  PLT 296   Thyroid  No results for input(s): TSH, FREET4 in the last 168 hours.  BNPNo results for input(s): BNP, PROBNP in the last 168 hours.  DDimer No results for input(s): DDIMER in the last 168  hours.  Radiology/Studies:  No results found.   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  - Echo  - NPO at midnight  - EP consult  - PM at next available time   Risk Assessment/Risk Scores:               For questions or updates, please contact [facility] Please consult [email or web address] for contact info under    Signed, Jalaine DELENA Newcomer, MD  05/06/2024 9:00 PM

## 2024-05-06 NOTE — H&P (Signed)
 History and Physical  Jessica Sherman FMW:[id number] DOB: July 28, 1931 DOA: 05/06/2024  PCP: Geofm Glade PARAS, MD   Chief Complaint: Syncope  HPI: Jessica Sherman is a 89 y.o. female with medical history significant for HTN, HLD, hypothyroidism, anxiety, breast cancer s/p mastectomy, prediabetes, tongue cancer, vertigo, dysphagia and IDA who presented to the ED for evaluation of syncope. Patient reports over the last few weeks he has had low energy and fatigue. She was eval by her PCP over a week ago, diagnosed with sinusitis and prescribed Amoxil  liquid suspension. She was also found to have low sodium so she has been drinking Gatorade. Over this timeframe, she has had intermittent dizziness and  feeling like I am going to pass out she was recently prescribed Xanax  for anxiety, took half dose last night and another dose this morning. Earlier today, while sitting in her bed, she had another feeling that she was going to pass out and had a brief episode of syncope. Per granddaughter, patient lost consciousness for about 30 to 60 seconds while sitting in bed but did not fall over. When she woke up, she was slightly shaking and afraid but did not have any confusion. Patient had a similar episode over a week ago and EMS was called however vitals were stable and patient was not transported to the ER. Patient currently endorsing fatigue but denies any chest pain, shortness of breath, nausea, vomiting, headache, vision changes, abdominal pain, dysuria, focal weakness, numbness or tingling.  ED Course: Initial vitals show temp 97.9, RR 16, HR 38-42, SBP 180s to 200s, SpO2 98% on room air. Initial labs significant for sodium 125, K+ 5.1, glucose 128, creatinine 0.73, WBC 10.2, Hgb 11.9, mag 2.2. EKG shows 2-1 heart block, RBBB, LVH and prolonged QTc of 555. CXR shows mild cardiomegaly but no active disease. Pt received IV NS 500 cc bolus.  Cardiology was consulted for evaluation. [facility] was consulted for  admission.   Review of Systems: Please see HPI for pertinent positives and negatives. A complete 10 system review of systems are otherwise negative.  Past Medical History:  Diagnosis Date   Hx of colonic polyps    Hyperlipidemia    Hypothyroidism    Macular degeneration, dry    Osteopenia    spine BMD T score - 1.69   PONV (postoperative nausea and vomiting)    pt has vertigo and can not lie flat   Prediabetes    FBS 136  on 08/29/2010; A1c 6.2%   Tongue cancer (HCC)    Vertigo    Past Surgical History:  Procedure Laterality Date   CATARACT EXTRACTION  2009   OS   COLONOSCOPY W/ POLYPECTOMY     X 2; last 2012. Dr Rosalie   IR GASTROSTOMY TUBE MOD SED  04/17/2019   IR GASTROSTOMY TUBE REMOVAL  08/29/2019   IR GENERIC HISTORICAL  05/09/2016   IR US  GUIDE VASC ACCESS RIGHT 05/09/2016 Wilkie Lent, MD [facility]   IR GENERIC HISTORICAL  05/09/2016   IR FLUORO GUIDE CV LINE RIGHT 05/09/2016 Wilkie Lent, MD [facility]   MODIFIED MASTECTOMY Left 10/18/2016   Procedure: LEFT MODIFIED RADICAL MASTECTOMY;  Surgeon: Vicenta Poli, MD;  Location: [facility];  Service: General;  Laterality: Left;   PILONIDAL CYST EXCISION     X 3   TONSILLECTOMY AND ADENOIDECTOMY     TOTAL ABDOMINAL HYSTERECTOMY     w/ bladder repair   Social History:  reports that she has never smoked. She  has been exposed to tobacco smoke. She has never used smokeless tobacco. She reports current alcohol use. She reports that she does not use drugs.  Allergies  Allergen Reactions   Other Nausea And Vomiting    Anesthesia makes me very sick, Please pre-medicate to control nausea and vomiting    Family History  Problem Relation Age of Onset   Osteoporosis Mother    Leukemia Mother    Aneurysm Father        AAA   Dementia Sister    Cancer Maternal Uncle        ? stomach   Heart attack Neg Hx      Prior to Admission medications   Medication Sig Start Date End Date Taking?  Authorizing Provider  ALPRAZolam  (XANAX ) 0.25 MG tablet Take 1 tablet (0.25 mg total) by mouth 2 (two) times daily as needed for anxiety. 05/01/24   Geofm Glade PARAS, MD  amoxicillin  (AMOXIL ) 400 MG/5ML suspension Take 5 mLs (400 mg total) by mouth 2 (two) times daily for 10 days. 04/25/24 05/06/24  Norleen Lynwood ORN, MD  Calcium  Carb-Cholecalciferol  (CALCIUM  600+D3 PO) Take 1 tablet by mouth 2 (two) times daily.    [provider]  Cholecalciferol  (VITAMIN D3) 1000 UNITS CAPS Take 1,000 Units by mouth daily.    [provider]  levothyroxine  (SYNTHROID ) 75 MCG tablet Take 1 tablet (75 mcg total) by mouth daily. 01/23/24   Geofm Glade PARAS, MD  meclizine  (ANTIVERT ) 25 MG tablet Take 1 tablet (25 mg total) by mouth every 8 (eight) hours as needed for dizziness 05/02/24     Multiple Vitamins-Minerals (PRESERVISION AREDS PO) Take by mouth in the morning and at bedtime.    [provider]  simvastatin  (ZOCOR ) 20 MG tablet Take 1 tablet (20 mg total) by mouth at bedtime. 01/23/24   Geofm Glade PARAS, MD  valsartan  (DIOVAN ) 40 MG tablet Take 0.5 tablets (20 mg total) by mouth daily. 02/01/24   Geofm Glade PARAS, MD  valsartan  (DIOVAN ) 40 MG tablet Take 1 tablet (40 mg total) by mouth daily. 04/21/24   Geofm Glade PARAS, MD  prochlorperazine  (COMPAZINE ) 10 MG tablet Take 1 tablet (10 mg total) by mouth every 6 (six) hours as needed (Nausea or vomiting). Patient not taking: Reported on 07/27/2016 05/01/16 09/18/16  Odean Potts, MD    Physical Exam: BP (!) 186/53   Pulse (!) 39   Temp 98.6 F (37 C)   Resp 13   SpO2 100%  General: Pleasant, well-appearing elderly woman laying in bed. No acute distress. HEENT: Kay/AT. Anicteric sclera.  Dry mucous membrane. CV: Bradycardic.  Regular rhythm. No murmurs, rubs, or gallops. No LE edema Pulmonary: Lungs CTAB. Normal effort. No wheezing or rales. Abdominal: Soft, nontender, nondistended. Normal bowel sounds. Extremities: Palpable radial and DP pulses.  Normal ROM. Skin: Warm and dry. No obvious rash or lesions. Neuro: A&Ox3. Moves all extremities. Normal sensation to light touch. No focal deficit. Psych: Normal mood and affect          Labs on Admission:  Basic Metabolic Panel: Recent Labs  Lab 05/06/24 2013  NA 125*  K 5.1  CL 88*  CO2 24  GLUCOSE 128*  BUN 18  CREATININE 0.73  CALCIUM  9.5  MG 2.2   Liver Function Tests: No results for input(s): AST, ALT, ALKPHOS, BILITOT, PROT, ALBUMIN in the last 168 hours. No results for input(s): LIPASE, AMYLASE in the last 168 hours. No results for input(s): AMMONIA in the last 168  hours. CBC: Recent Labs  Lab 05/06/24 2013  WBC 10.2  HGB 11.9*  HCT 37.1  MCV 97.6  PLT 296   Cardiac Enzymes: No results for input(s): CKTOTAL, CKMB, CKMBINDEX, TROPONINI in the last 168 hours. BNP (last 3 results) No results for input(s): BNP in the last 8760 hours.  ProBNP (last 3 results) No results for input(s): PROBNP in the last 8760 hours.  CBG: No results for input(s): GLUCAP in the last 168 hours.  Radiological Exams on Admission: DG Chest Portable 1 View Result Date: 05/06/2024 CLINICAL DATA:  Syncope and weakness EXAM: PORTABLE CHEST 1 VIEW COMPARISON:  03/28/2019 FINDINGS: Mild cardiac enlargement. No vascular congestion, edema, or consolidation. Scarring in the lung apices. No airspace disease or consolidation. Calcification of the aorta. Degenerative changes in the spine and shoulders. IMPRESSION: Mild cardiac enlargement. Scarring in the lung apices. No evidence of active pulmonary disease. Electronically Signed   By: Elsie Gravely M.D.   On: 05/06/2024 21:16   Assessment/Plan Jessica Sherman is a 89 y.o. female with medical history significant for HTN, HLD, hypothyroidism, anxiety, breast cancer s/p mastectomy, prediabetes, tongue cancer, dysphagia and IDA who presented to the ED for evaluation of syncope and admitted for symptomatic  bradycardia.  # Symptomatic bradycardia # High-grade AV block - Elderly patient with no cardiac history presenting with a brief syncope episode - Reports taking Xanax  last night and today - Found to have HR in the 30s to 40s, EKG showing 2-1 AV block with RBBB - Patient without any chest pain or shortness of breath, BP elevated but hemodynamically stable - Cardiology consulted, recommends transfer to [facility] for EP evaluation and likely pacemaker placement - Follow-up echocardiogram - Check TSH, free T4, troponin and phosphorus - Telemetry  # Acute on chronic hyponatremia - Sodium of 125 on admission from baseline 129-132 over the last 2 years - Pt slightly dry on exam with recent dizzy spells and fatigue but at baseline mental status - S/p IV NS 500 cc bolus in the ED, repeat sodium still at 125 - Give additional 500 cc bolus of IVNS and start 100 cc/h infusion for 10 hours - Check urine sodium and urine osm - F/u repeat sodium  # QT prolongation - EKG on admission showed QTc of 555 likely in the setting of AV block - Avoid QTc prolonging meds - Mag 2.2, K+ 5.1  # Hypertensive urgency - BP elevated with SBP in the 170s to 210s - Continue valsartan  (Irbesartan  as substitute) - IV hydralazine  PRN for SBP > 180  # HLD - Continue simvastatin   # Hypothyroidism - Follow-up repeat TSH, free T4 - Continue levothyroxine   # Anxiety - Holding Xanax  in the setting of bradycardia - Start hydroxyzine  as needed for anxiety  DVT prophylaxis: Lovenox      Code Status: Limited: Do not attempt resuscitation (DNR) -DNR-LIMITED -Do Not Intubate/DNI   Consults called: Cardiology  Family Communication: Discussed admission with granddaughters at bedside  Severity of Illness: The appropriate patient status for this patient is INPATIENT. Inpatient status is judged to be reasonable and necessary in order to provide the required intensity of service to ensure the patient's safety. The patient's  presenting symptoms, physical exam findings, and initial radiographic and laboratory data in the context of their chronic comorbidities is felt to place them at high risk for further clinical deterioration. Furthermore, it is not anticipated that the patient will be medically stable for discharge from the hospital within 2 midnights of admission.   *  I certify that at the point of admission it is my clinical judgment that the patient will require inpatient hospital care spanning beyond 2 midnights from the point of admission due to high intensity of service, high risk for further deterioration and high frequency of surveillance required.*  Level of care: Telemetry Cardiac   This record has been created using Dragon voice recognition software. Errors have been sought and corrected, but may not always be located. Such creation errors do not reflect on the standard of care.   Lou Claretta HERO, MD 05/06/2024, 10:55 PM [facility] Pager: [phone] Isaiah 41:10   If 7PM-7AM, please contact night-coverage [email or web address] Password [email or web address]

## 2024-05-07 ENCOUNTER — Inpatient Hospital Stay (HOSPITAL_COMMUNITY)

## 2024-05-07 ENCOUNTER — Other Ambulatory Visit: Payer: Self-pay

## 2024-05-07 ENCOUNTER — Inpatient Hospital Stay (HOSPITAL_COMMUNITY)
Admission: EM | Disposition: A | Discharge status: Home / Self Care | Payer: Self-pay | Source: Home / Self Care | Attending: Family Medicine

## 2024-05-07 DIAGNOSIS — I160 Hypertensive urgency: Secondary | ICD-10-CM

## 2024-05-07 DIAGNOSIS — I441 Atrioventricular block, second degree: Secondary | ICD-10-CM | POA: Diagnosis not present

## 2024-05-07 DIAGNOSIS — E871 Hypo-osmolality and hyponatremia: Secondary | ICD-10-CM | POA: Diagnosis not present

## 2024-05-07 DIAGNOSIS — R001 Bradycardia, unspecified: Secondary | ICD-10-CM | POA: Diagnosis not present

## 2024-05-07 DIAGNOSIS — I442 Atrioventricular block, complete: Principal | ICD-10-CM

## 2024-05-07 DIAGNOSIS — R55 Syncope and collapse: Secondary | ICD-10-CM | POA: Diagnosis not present

## 2024-05-07 HISTORY — PX: PACEMAKER IMPLANT: EP1218

## 2024-05-07 LAB — CBC
HCT: 32.5 % — ABNORMAL LOW (ref 36.0–46.0)
Hemoglobin: 11.4 g/dL — ABNORMAL LOW (ref 12.0–15.0)
MCH: 33.7 pg (ref 26.0–34.0)
MCHC: 35.1 g/dL (ref 30.0–36.0)
MCV: 96.2 fL (ref 80.0–100.0)
Platelets: 304 K/uL (ref 150–400)
RBC: 3.38 MIL/uL — ABNORMAL LOW (ref 3.87–5.11)
RDW: 11.9 % (ref 11.5–15.5)
WBC: 10.5 K/uL (ref 4.0–10.5)
nRBC: 0 % (ref 0.0–0.2)

## 2024-05-07 LAB — ECHOCARDIOGRAM COMPLETE
Area-P 1/2: 5.84 cm2
Calc EF: 66.4 %
Height: 62 in
S' Lateral: 2.7 cm
Single Plane A2C EF: 70.8 %
Single Plane A4C EF: 61.5 %
Weight: 1707.24 [oz_av]

## 2024-05-07 LAB — SURGICAL PCR SCREEN
MRSA, PCR: NEGATIVE
Staphylococcus aureus: POSITIVE — AB

## 2024-05-07 LAB — TSH: TSH: 2.83 u[IU]/mL (ref 0.350–4.500)

## 2024-05-07 LAB — BASIC METABOLIC PANEL WITH GFR
Anion gap: 10 (ref 5–15)
BUN: 16 mg/dL (ref 8–23)
CO2: 21 mmol/L — ABNORMAL LOW (ref 22–32)
Calcium: 8.9 mg/dL (ref 8.9–10.3)
Chloride: 94 mmol/L — ABNORMAL LOW (ref 98–111)
Creatinine, Ser: 0.66 mg/dL (ref 0.44–1.00)
GFR, Estimated: >60 mL/min (ref >60)
Glucose, Bld: 134 mg/dL — ABNORMAL HIGH (ref 70–99)
Potassium: 4.5 mmol/L (ref 3.5–5.1)
Sodium: 125 mmol/L — ABNORMAL LOW (ref 135–145)

## 2024-05-07 LAB — T4, FREE: Free T4: 1.21 ng/dL — ABNORMAL HIGH (ref 0.61–1.12)

## 2024-05-07 LAB — SODIUM, URINE, RANDOM: Sodium, Ur: 115 mmol/L

## 2024-05-07 LAB — OSMOLALITY, URINE: Osmolality, Ur: 683 mosm/kg (ref 300–900)

## 2024-05-07 LAB — TROPONIN I (HIGH SENSITIVITY)
Troponin I (High Sensitivity): 47 ng/L — ABNORMAL HIGH (ref <18)
Troponin I (High Sensitivity): 43 ng/L — ABNORMAL HIGH (ref <18)

## 2024-05-07 SURGERY — PACEMAKER IMPLANT

## 2024-05-07 MED ORDER — SODIUM CHLORIDE 0.9 % IV SOLN: 80.0000 mg | INTRAVENOUS | Status: AC

## 2024-05-07 MED ORDER — CEFAZOLIN SODIUM-DEXTROSE 1-4 GM/50ML-% IV SOLN
1.0000 g | Freq: Four times a day (QID) | INTRAVENOUS | Status: AC
  Administered 2024-05-07 – 2024-05-08 (×3): 1 g via INTRAVENOUS
  Filled 2024-05-07 (×3): qty 50

## 2024-05-07 MED ORDER — SODIUM CHLORIDE 0.9 % IV SOLN: INTRAVENOUS | Status: DC

## 2024-05-07 MED ORDER — MIDAZOLAM HCL 5 MG/5ML IJ SOLN
INTRAMUSCULAR | Status: DC | PRN
  Administered 2024-05-07 (×3): .5 mg via INTRAVENOUS

## 2024-05-07 MED ORDER — FENTANYL CITRATE (PF) 100 MCG/2ML IJ SOLN
INTRAMUSCULAR | Status: AC
  Filled 2024-05-07: qty 2

## 2024-05-07 MED ORDER — HEPARIN (PORCINE) IN NACL 1000-0.9 UT/500ML-% IV SOLN
INTRAVENOUS | Status: DC | PRN
Start: 2024-05-07 — End: 2024-05-07
  Administered 2024-05-07: 500 mL

## 2024-05-07 MED ORDER — HYDRALAZINE HCL 20 MG/ML IJ SOLN
10.0000 mg | Freq: Three times a day (TID) | INTRAMUSCULAR | Status: DC | PRN
  Administered 2024-05-07: 10 mg via INTRAVENOUS
  Filled 2024-05-07 (×2): qty 1

## 2024-05-07 MED ORDER — FENTANYL CITRATE (PF) 100 MCG/2ML IJ SOLN
INTRAMUSCULAR | Status: DC | PRN
Start: 2024-05-07 — End: 2024-05-07
  Administered 2024-05-07 (×2): 25 ug via INTRAVENOUS
  Administered 2024-05-07: 12.5 ug via INTRAVENOUS

## 2024-05-07 MED ORDER — MUPIROCIN 2 % EX OINT
1.0000 | TOPICAL_OINTMENT | Freq: Two times a day (BID) | CUTANEOUS | Status: DC
  Administered 2024-05-07 – 2024-05-08 (×2): 1 via NASAL
  Filled 2024-05-07: qty 22

## 2024-05-07 MED ORDER — HYDROXYZINE HCL 10 MG PO TABS
10.0000 mg | ORAL_TABLET | Freq: Three times a day (TID) | ORAL | Status: DC | PRN
  Administered 2024-05-07: 10 mg via ORAL
  Filled 2024-05-07: qty 1

## 2024-05-07 MED ORDER — HYDROXYZINE HCL 10 MG PO TABS: 10.0000 mg | ORAL_TABLET | Freq: Three times a day (TID) | ORAL | Status: DC | PRN

## 2024-05-07 MED ORDER — LIDOCAINE HCL (PF) 1 % IJ SOLN
INTRAMUSCULAR | Status: DC | PRN
  Administered 2024-05-07: 30 mL

## 2024-05-07 MED ORDER — LIDOCAINE HCL (PF) 1 % IJ SOLN
INTRAMUSCULAR | Status: AC
  Filled 2024-05-07: qty 30

## 2024-05-07 MED ORDER — ONDANSETRON HCL 4 MG/2ML IJ SOLN: 4.0000 mg | Freq: Four times a day (QID) | INTRAMUSCULAR | Status: DC | PRN

## 2024-05-07 MED ORDER — SODIUM CHLORIDE 0.9 % IV SOLN
INTRAVENOUS | Status: AC
  Administered 2024-05-07: 80 mg
  Filled 2024-05-07: qty 2

## 2024-05-07 MED ORDER — CEFAZOLIN SODIUM-DEXTROSE 2-4 GM/100ML-% IV SOLN
INTRAVENOUS | Status: AC
  Administered 2024-05-07: 2 g via INTRAVENOUS
  Filled 2024-05-07: qty 100

## 2024-05-07 MED ORDER — CHLORHEXIDINE GLUCONATE 4 % EX SOLN: 60.0000 mL | Freq: Once | CUTANEOUS | Status: DC

## 2024-05-07 MED ORDER — CHLORHEXIDINE GLUCONATE CLOTH 2 % EX PADS
6.0000 | MEDICATED_PAD | Freq: Every day | CUTANEOUS | Status: DC
  Administered 2024-05-07 – 2024-05-08 (×2): 6 via TOPICAL

## 2024-05-07 MED ORDER — MIDAZOLAM HCL 2 MG/2ML IJ SOLN
INTRAMUSCULAR | Status: AC
Start: 2024-05-07 — End: 2024-05-07
  Filled 2024-05-07: qty 2

## 2024-05-07 MED ORDER — CEFAZOLIN SODIUM-DEXTROSE 2-4 GM/100ML-% IV SOLN: 2.0000 g | INTRAVENOUS | Status: AC

## 2024-05-07 MED ORDER — ONDANSETRON HCL 4 MG/2ML IJ SOLN
INTRAMUSCULAR | Status: AC
  Filled 2024-05-07: qty 2

## 2024-05-07 MED ORDER — FENTANYL CITRATE (PF) 100 MCG/2ML IJ SOLN
INTRAMUSCULAR | Status: AC
Start: 2024-05-07 — End: 2024-05-07
  Filled 2024-05-07: qty 2

## 2024-05-07 MED ORDER — SODIUM CHLORIDE 0.9 % IV SOLN: INTRAVENOUS | Status: AC

## 2024-05-07 MED ORDER — ONDANSETRON HCL 4 MG/2ML IJ SOLN
INTRAMUSCULAR | Status: DC | PRN
  Administered 2024-05-07: 4 mg via INTRAVENOUS

## 2024-05-07 SURGICAL SUPPLY — 14 items
CABLE SURGICAL S-101-97-12 (CABLE) ×1 IMPLANT
CATH CPS LOCATOR 3D SM (CATHETERS) IMPLANT
LEAD ULTIPACE 46 LPA1231/46 (Lead) IMPLANT
LEAD ULTIPACE 58 LPA1231/58 (Lead) IMPLANT
PACEMAKER ASSURITY DR-RF (Pacemaker) IMPLANT
PAD DEFIB RADIO PHYSIO CONN (PAD) ×1 IMPLANT
POUCH AIGIS-R ANTIBACT PPM MED (Mesh General) IMPLANT
SHEATH 7FR PRELUDE SNAP 13 (SHEATH) IMPLANT
SHEATH 9FR PRELUDE SNAP 13 (SHEATH) IMPLANT
SHEATH PROBE COVER 6X72 (BAG) IMPLANT
SLITTER AGILIS HISPRO (INSTRUMENTS) IMPLANT
TOOL HELIX LOCKING (MISCELLANEOUS) IMPLANT
TRAY PACEMAKER INSERTION (PACKS) ×1 IMPLANT
WIRE HI TORQ VERSACORE-J 145CM (WIRE) IMPLANT

## 2024-05-07 NOTE — Interval H&P Note (Signed)
 History and Physical Interval Note:  05/07/2024 1:56 PM  Jessica Sherman  has presented today for surgery, with the diagnosis of high grade heart block.  The various methods of treatment have been discussed with the patient and family. After consideration of risks, benefits and other options for treatment, the patient has consented to  Procedure(s): PACEMAKER IMPLANT (N/A) as a surgical intervention.  The patient's history has been reviewed, patient examined, no change in status, stable for surgery.  I have reviewed the patient's chart and labs.  Questions were answered to the patient's satisfaction.     Fonda Kitty

## 2024-05-07 NOTE — Progress Notes (Signed)
  Echocardiogram 2D Echocardiogram has been performed.  Juliene JINNY Rucks 05/07/2024, 12:22 PM

## 2024-05-07 NOTE — H&P (View-Only) (Signed)
 ELECTROPHYSIOLOGY CONSULT NOTE    Patient ID: Jessica Sherman MRN: [id number], DOB/AGE: 88/05/1931 88 y.o.  Admit date: 05/06/2024 Date of Consult: 05/07/2024  Primary Physician: Geofm Glade PARAS, MD Primary Cardiologist: None  Electrophysiologist: New   Referring Provider: Dr. Drusilla  Patient Profile: ISELLA Sherman is a 89 y.o. female with a history of HTN, HLD, hypothyroidism, Breast CA treated with herceptin  and LEFT mastectomy (stable echos), and IDA who is being seen today for the evaluation of symptomatic bradycardia at the request of DR. Lama.  HPI:  Jessica Sherman is a 89 y.o. female with medical history as above.   Pt recently seen by PCP for intermittent dizziness and pre-syncope x 1-2 weeks. Work up unremarkable at that time.   Pt presented [facility]/06/2024 to [facility] after syncope while seated at home. Per granddaughter she lost consciousness for approx 30-60 seconds. No confusion on waking up.  Previously evaluated by EMS for a similar episode but was not transported to the ER.     Pt is currently feeling poorly. Very tired. Chronically has poor appetite after tongue cancer requiring radiation.  No chest pain or SOB at rest. No edema. Her weight has been relatively stable for the past several years, right around 100 lbs.   Labs Potassium4.5 (08/27 9692) Magnesium   2.2 (08/26 2013) Creatinine, ser  0.66 (08/27 0307) PLT  304 (08/27 0307) HGB  11.4* (08/27 0307) WBC 10.5 (08/27 0307) Troponin I (High Sensitivity)43* (08/27 0317).    Past Medical History:  Diagnosis Date   Hx of colonic polyps    Hyperlipidemia    Hypothyroidism    Macular degeneration, dry    Osteopenia    spine BMD T score - 1.69   PONV (postoperative nausea and vomiting)    pt has vertigo and can not lie flat   Prediabetes    FBS 136  on 08/29/2010; A1c 6.2%   Tongue cancer (HCC)    Vertigo      Surgical History:  Past Surgical History:  Procedure Laterality Date   CATARACT  EXTRACTION  2009   OS   COLONOSCOPY W/ POLYPECTOMY     X 2; last 2012. Dr Rosalie   IR GASTROSTOMY TUBE MOD SED  04/17/2019   IR GASTROSTOMY TUBE REMOVAL  08/29/2019   IR GENERIC HISTORICAL  05/09/2016   IR US  GUIDE VASC ACCESS RIGHT 05/09/2016 Wilkie Lent, MD [facility]   IR GENERIC HISTORICAL  05/09/2016   IR FLUORO GUIDE CV LINE RIGHT 05/09/2016 Wilkie Lent, MD [facility]   MODIFIED MASTECTOMY Left 10/18/2016   Procedure: LEFT MODIFIED RADICAL MASTECTOMY;  Surgeon: Vicenta Poli, MD;  Location: [facility];  Service: General;  Laterality: Left;   PILONIDAL CYST EXCISION     X 3   TONSILLECTOMY AND ADENOIDECTOMY     TOTAL ABDOMINAL HYSTERECTOMY     w/ bladder repair     Medications Prior to Admission  Medication Sig Dispense Refill Last Dose/Taking   ALPRAZolam  (XANAX ) 0.25 MG tablet Take 1 tablet (0.25 mg total) by mouth 2 (two) times daily as needed for anxiety. 30 tablet 2 Unknown   Calcium  Carb-Cholecalciferol  (CALCIUM  600+D3 PO) Take 1 tablet by mouth 2 (two) times daily.   05/06/2024   Cholecalciferol  (VITAMIN D3) 1000 UNITS CAPS Take 1,000 Units by mouth daily.   05/06/2024   levothyroxine  (SYNTHROID ) 75 MCG tablet Take 1 tablet (75 mcg total) by mouth daily. 90 tablet 2 05/06/2024   meclizine  (ANTIVERT ) 25 MG tablet  Take 1 tablet (25 mg total) by mouth every 8 (eight) hours as needed for dizziness 40 tablet 2 Unknown   simvastatin  (ZOCOR ) 20 MG tablet Take 1 tablet (20 mg total) by mouth at bedtime. (Patient taking differently: Take 20 mg by mouth at bedtime.) 90 tablet 2 05/05/2024   valsartan  (DIOVAN ) 40 MG tablet Take 0.5 tablets (20 mg total) by mouth daily.   05/06/2024   Multiple Vitamins-Minerals (PRESERVISION AREDS PO) Take by mouth in the morning and at bedtime. (Patient not taking: Reported on 05/06/2024)   Not Taking    Inpatient Medications:   calcium -vitamin D   1 tablet Oral BID   cholecalciferol   1,000 Units Oral Daily   irbesartan   37.5 mg  Oral Daily   levothyroxine   75 mcg Oral Q0600   simvastatin   20 mg Oral QHS    Allergies:  Allergies  Allergen Reactions   Other Nausea And Vomiting    Anesthesia makes me very sick, Please pre-medicate to control nausea and vomiting    Family History  Problem Relation Age of Onset   Osteoporosis Mother    Leukemia Mother    Aneurysm Father        AAA   Dementia Sister    Cancer Maternal Uncle        ? stomach   Heart attack Neg Hx      Physical Exam: Vitals:   05/06/24 2030 05/07/24 0015 05/07/24 0300 05/07/24 0437  BP: (!) 186/53 (!) 188/57 (!) 187/53   Pulse: (!) 39 (!) 47 (!) 41 (!) 35  Resp: 13 16 13 17   Temp: 98.6 F (37 C) 98.1 F (36.7 C) 98.2 F (36.8 C)   TempSrc:  Oral Oral   SpO2: 100% 99% 93%   Weight:  48.4 kg    Height:  5' 2 (1.575 m)      GEN-  Chronically ill appearing. Cachetic with temporal wasting.  HEENT: Normocephalic, atraumatic Lungs- CTAB, Normal effort.  Heart- Slow, regular rate and rhythm, No M/G/R.  GI- Soft, NT, ND.  Extremities- No clubbing, cyanosis, or edema   Radiology/Studies: DG Chest Portable 1 View Result Date: 05/06/2024 CLINICAL DATA:  Syncope and weakness EXAM: PORTABLE CHEST 1 VIEW COMPARISON:  03/28/2019 FINDINGS: Mild cardiac enlargement. No vascular congestion, edema, or consolidation. Scarring in the lung apices. No airspace disease or consolidation. Calcification of the aorta. Degenerative changes in the spine and shoulders. IMPRESSION: Mild cardiac enlargement. Scarring in the lung apices. No evidence of active pulmonary disease. Electronically Signed   By: Elsie Gravely M.D.   On: 05/06/2024 21:16    EKG:2:1 bradycardia in the 40s (personally reviewed)  TELEMETRY: 2:1 bradycardia in the 40s (personally reviewed)  Assessment/Plan:  Advanced AV block 2:1 AV block Symptomatic bradycardia Incomplete left bundle at baseline.  Not on [facility]  Not on AV nodal agents No clear reversible cause.   She is at  increase risk of complication due to her very small body stature.  She is not a candidate for leadless pacing given throat radiation.   She may need a R sided device given history of left mastectomy.   Explained risks, benefits, and alternatives to PPM implantation, including but not limited to bleeding, infection, pneumothorax, pericardial effusion, lead dislodgement, heart attack, stroke, or death.  Pt verbalized understanding and agrees to proceed.    Not on [facility]  Dr. Kennyth to see. Clear breakfast and then will leave NPO for now.   For questions or updates, please contact [facility] Please consult [email or web address] for contact info under     Signed, Ozell Prentice Passey, PA-C  05/07/2024, 7:35 AM    I have seen, examined the patient, and reviewed the above assessment and plan.    HPI: Ms. Cid is a 89 year old female with past medical history notable for left-sided breast cancer status postmastectomy, HTN, HLD, hypothyroidism who presented to the ED with chief complaint of multiple syncopal episodes and exertional fatigue.  She was found to have bradycardia with 2-1 AV block and was transferred to [facility].  At rest she reports feeling okay but is concerned about her ongoing health.  She reports that she was previously very functional and independent, including driving herself prior to the passing out episodes.  General: Frail elderly female, in no acute distress.  Neck: No JVD.  Cardiac: Bradycardic, regular Resp: Normal work of breathing.  Ext: No edema.  Neuro: No gross focal deficits.  Psych: Normal affect.   Assessment: Ms. Robinette is a 89 year old female who presents with high-grade AV block and symptomatic bradycardia.  Plan:  - Patient meets criteria for permanent pacemaker given symptomatic bradycardia and high-grade AV block.  I suspect that her syncopal episodes are related to her conduction disease.  Due to her history of breast cancer, she necessitates a  right sided transvenous device.  Given her low BMI, I discussed with patient and her family that she is higher than normal risk of infection. Explained risks, benefits, and alternatives to pacemaker implantation, including but not limited to bleeding, infection, damage to heart or lungs, heart attack, stroke, or death.  Pt and family members have verbalized understanding and all parties want to proceed.  We will plan for right sided transvenous pacemaker implant today.   Fonda Kitty, MD 05/07/2024 1:51 PM

## 2024-05-07 NOTE — Progress Notes (Signed)
 [facility]  PROGRESS NOTE  EMRI SAMPLE FMW:[id number] DOB: [birth year] DOA: 05/06/2024 PCP: Geofm Glade PARAS, MD   Brief HPI:   89 y.o. female with medical history significant for HTN, HLD, hypothyroidism, anxiety, breast cancer s/p mastectomy, prediabetes, tongue cancer, vertigo, dysphagia and IDA who presented to the ED for evaluation of syncope. Patient reports over the last few weeks he has had low energy and fatigue. She was eval by her PCP over a week ago, diagnosed with sinusitis and prescribed Amoxil  liquid suspension. She was also found to have low sodium so she has been drinking Gatorade. Over this timeframe, she has had intermittent dizziness and  feeling like I am going to pass out she was recently prescribed Xanax  for anxiety, took half dose last night and another dose this morning. Earlier today, while sitting in her bed, she had another feeling that she was going to pass out and had a brief episode of syncope. Per granddaughter, patient lost consciousness for about 30 to 60 seconds while sitting in bed but did not fall over. When she woke up, she was slightly shaking and afraid but did not have any confusion. Patient had a similar episode over a week ago and EMS was called however vitals were stable and patient was not transported to the ER. Patient currently endorsing fatigue but denies any chest pain, shortness of breath, nausea, vomiting, headache, vision changes, abdominal pain, dysuria, focal weakness, numbness or tingling.     Assessment/Plan:    Symptomatic bradycardia # High-grade AV block -Status post pacemaker placement - Elderly patient with no cardiac history presenting with a brief syncope episode - Reports taking Xanax  last night and today - Found to have HR in the 30s to 40s, EKG showing 2-1 AV block with RBBB - Patient without any chest pain or shortness of breath, BP elevated but hemodynamically stable - Cardiology consulted, recommends transfer to [facility]  for EP evaluation and likely pacemaker placement - Follow-up echocardiogram - Check TSH 2.830, T4 1.21, free T4, troponin and phosphorus 2.9    # Acute on chronic hyponatremia - Sodium of 125 on admission from baseline 129-132 over the last 2 years - Pt slightly dry on exam with recent dizzy spells and fatigue but at baseline mental status - S/p IV NS 500 cc bolus in the ED, repeat sodium still at 125 - Give additional 500 cc bolus of IVNS and start 100 cc/h infusion for 10 hours - Urine osmolality 683 -Follow serum sodium level in a.m.   # QT prolongation - EKG on admission showed QTc of 555 likely in the setting of AV block - Avoid QTc prolonging meds -   # Hypertensive urgency - BP elevated with SBP in the 170s to 210s - Continue valsartan  (Irbesartan  as substitute) - IV hydralazine  PRN for SBP > 180   # HLD - Continue simvastatin    # Hypothyroidism - Continue levothyroxine    # Anxiety - Holding Xanax  in the setting of bradycardia - Start hydroxyzine  as needed for anxiety      Medications     calcium -vitamin D   1 tablet Oral BID   cholecalciferol   1,000 Units Oral Daily   irbesartan   37.5 mg Oral Daily   levothyroxine   75 mcg Oral Q0600   simvastatin   20 mg Oral QHS     Data Reviewed:   CBG:  No results for input(s): GLUCAP in the last 168 hours.  SpO2: 93 %    Vitals:   05/07/24 0300 05/07/24  9562 05/07/24 0803 05/07/24 0902  BP: (!) 187/53  (!) 189/64 (!) 137/55  Pulse: (!) 41 (!) 35 (!) 36   Resp: 13 17 12    Temp: 98.2 F (36.8 C)  98 F (36.7 C)   TempSrc: Oral  Oral   SpO2: 93%     Weight:      Height:          Data Reviewed:  Basic Metabolic Panel: Recent Labs  Lab 05/06/24 2013 05/06/24 2307 05/07/24 0307  NA 125* 125* 125*  K 5.1  --  4.5  CL 88*  --  94*  CO2 24  --  21*  GLUCOSE 128*  --  134*  BUN 18  --  16  CREATININE 0.73  --  0.66  CALCIUM  9.5  --  8.9  MG 2.2  --   --   PHOS  --  2.9  --     CBC: Recent  Labs  Lab 05/06/24 2013 05/07/24 0307  WBC 10.2 10.5  HGB 11.9* 11.4*  HCT 37.1 32.5*  MCV 97.6 96.2  PLT 296 304    LFT Recent Labs  Lab 05/06/24 2307  AST 29  ALT 20  ALKPHOS 46  BILITOT 0.5  PROT 6.3*  ALBUMIN 3.8     Antibiotics: Anti-infectives (From admission, onward)    None        DVT prophylaxis: SCDs  Code Status: DNR  Family Communication: Discussed with family at bedside   CONSULTS cardiology   Subjective   Denies any complaints.  Status post pacemaker placement   Objective    Physical Examination:   General-appears in no acute distress Heart-S1-S2, regular, no murmur auscultated Lungs-clear to auscultation bilaterally, no wheezing or crackles auscultated Abdomen-soft, nontender, no organomegaly Extremities-no edema in the lower extremities Neuro-alert, oriented x3, no focal deficit noted   Status is: Inpatient:             Sabas GORMAN Brod   [facility] If 7PM-7AM, please contact night-coverage at [email or web address], Office  [phone]   05/07/2024, 9:36 AM  LOS: 1 day

## 2024-05-07 NOTE — Consult Note (Addendum)
 ELECTROPHYSIOLOGY CONSULT NOTE    Patient ID: Jessica Sherman MRN: [id number], DOB/AGE: 88/05/1931 88 y.o.  Admit date: 05/06/2024 Date of Consult: 05/07/2024  Primary Physician: Geofm Glade PARAS, MD Primary Cardiologist: None  Electrophysiologist: New   Referring Provider: Dr. Drusilla  Patient Profile: Jessica Sherman is a 89 y.o. female with a history of HTN, HLD, hypothyroidism, Breast CA treated with herceptin  and LEFT mastectomy (stable echos), and IDA who is being seen today for the evaluation of symptomatic bradycardia at the request of DR. Lama.  HPI:  Jessica Sherman is a 89 y.o. female with medical history as above.   Pt recently seen by PCP for intermittent dizziness and pre-syncope x 1-2 weeks. Work up unremarkable at that time.   Pt presented 05/06/2024 to [facility] after syncope while seated at home. Per granddaughter she lost consciousness for approx 30-60 seconds. No confusion on waking up.  Previously evaluated by EMS for a similar episode but was not transported to the ER.     Pt is currently feeling poorly. Very tired. Chronically has poor appetite after tongue cancer requiring radiation.  No chest pain or SOB at rest. No edema. Her weight has been relatively stable for the past several years, right around 100 lbs.   Labs Potassium4.5 (08/27 9692) Magnesium   2.2 (08/26 2013) Creatinine, ser  0.66 (08/27 0307) PLT  304 (08/27 0307) HGB  11.4* (08/27 0307) WBC 10.5 (08/27 0307) Troponin I (High Sensitivity)43* (08/27 0317).    Past Medical History:  Diagnosis Date   Hx of colonic polyps    Hyperlipidemia    Hypothyroidism    Macular degeneration, dry    Osteopenia    spine BMD T score - 1.69   PONV (postoperative nausea and vomiting)    pt has vertigo and can not lie flat   Prediabetes    FBS 136  on 08/29/2010; A1c 6.2%   Tongue cancer (HCC)    Vertigo      Surgical History:  Past Surgical History:  Procedure Laterality Date   CATARACT  EXTRACTION  2009   OS   COLONOSCOPY W/ POLYPECTOMY     X 2; last 2012. Dr Rosalie   IR GASTROSTOMY TUBE MOD SED  04/17/2019   IR GASTROSTOMY TUBE REMOVAL  08/29/2019   IR GENERIC HISTORICAL  05/09/2016   IR US  GUIDE VASC ACCESS RIGHT 05/09/2016 Wilkie Lent, MD [facility]   IR GENERIC HISTORICAL  05/09/2016   IR FLUORO GUIDE CV LINE RIGHT 05/09/2016 Wilkie Lent, MD [facility]   MODIFIED MASTECTOMY Left 10/18/2016   Procedure: LEFT MODIFIED RADICAL MASTECTOMY;  Surgeon: Vicenta Poli, MD;  Location: [facility];  Service: General;  Laterality: Left;   PILONIDAL CYST EXCISION     X 3   TONSILLECTOMY AND ADENOIDECTOMY     TOTAL ABDOMINAL HYSTERECTOMY     w/ bladder repair     Medications Prior to Admission  Medication Sig Dispense Refill Last Dose/Taking   ALPRAZolam  (XANAX ) 0.25 MG tablet Take 1 tablet (0.25 mg total) by mouth 2 (two) times daily as needed for anxiety. 30 tablet 2 Unknown   Calcium  Carb-Cholecalciferol  (CALCIUM  600+D3 PO) Take 1 tablet by mouth 2 (two) times daily.   05/06/2024   Cholecalciferol  (VITAMIN D3) 1000 UNITS CAPS Take 1,000 Units by mouth daily.   05/06/2024   levothyroxine  (SYNTHROID ) 75 MCG tablet Take 1 tablet (75 mcg total) by mouth daily. 90 tablet 2 05/06/2024   meclizine  (ANTIVERT ) 25 MG tablet  Take 1 tablet (25 mg total) by mouth every 8 (eight) hours as needed for dizziness 40 tablet 2 Unknown   simvastatin  (ZOCOR ) 20 MG tablet Take 1 tablet (20 mg total) by mouth at bedtime. (Patient taking differently: Take 20 mg by mouth at bedtime.) 90 tablet 2 05/05/2024   valsartan  (DIOVAN ) 40 MG tablet Take 0.5 tablets (20 mg total) by mouth daily.   05/06/2024   Multiple Vitamins-Minerals (PRESERVISION AREDS PO) Take by mouth in the morning and at bedtime. (Patient not taking: Reported on 05/06/2024)   Not Taking    Inpatient Medications:   calcium -vitamin D   1 tablet Oral BID   cholecalciferol   1,000 Units Oral Daily   irbesartan   37.5 mg  Oral Daily   levothyroxine   75 mcg Oral Q0600   simvastatin   20 mg Oral QHS    Allergies:  Allergies  Allergen Reactions   Other Nausea And Vomiting    Anesthesia makes me very sick, Please pre-medicate to control nausea and vomiting    Family History  Problem Relation Age of Onset   Osteoporosis Mother    Leukemia Mother    Aneurysm Father        AAA   Dementia Sister    Cancer Maternal Uncle        ? stomach   Heart attack Neg Hx      Physical Exam: Vitals:   05/06/24 2030 05/07/24 0015 05/07/24 0300 05/07/24 0437  BP: (!) 186/53 (!) 188/57 (!) 187/53   Pulse: (!) 39 (!) 47 (!) 41 (!) 35  Resp: 13 16 13 17   Temp: 98.6 F (37 C) 98.1 F (36.7 C) 98.2 F (36.8 C)   TempSrc:  Oral Oral   SpO2: 100% 99% 93%   Weight:  48.4 kg    Height:  5' 2 (1.575 m)      GEN-  Chronically ill appearing. Cachetic with temporal wasting.  HEENT: Normocephalic, atraumatic Lungs- CTAB, Normal effort.  Heart- Slow, regular rate and rhythm, No M/G/R.  GI- Soft, NT, ND.  Extremities- No clubbing, cyanosis, or edema   Radiology/Studies: DG Chest Portable 1 View Result Date: 05/06/2024 CLINICAL DATA:  Syncope and weakness EXAM: PORTABLE CHEST 1 VIEW COMPARISON:  03/28/2019 FINDINGS: Mild cardiac enlargement. No vascular congestion, edema, or consolidation. Scarring in the lung apices. No airspace disease or consolidation. Calcification of the aorta. Degenerative changes in the spine and shoulders. IMPRESSION: Mild cardiac enlargement. Scarring in the lung apices. No evidence of active pulmonary disease. Electronically Signed   By: Elsie Gravely M.D.   On: 05/06/2024 21:16    EKG:2:1 bradycardia in the 40s (personally reviewed)  TELEMETRY: 2:1 bradycardia in the 40s (personally reviewed)  Assessment/Plan:  Advanced AV block 2:1 AV block Symptomatic bradycardia Incomplete left bundle at baseline.  Not on [facility]  Not on AV nodal agents No clear reversible cause.   She is at  increase risk of complication due to her very small body stature.  She is not a candidate for leadless pacing given throat radiation.   She may need a R sided device given history of left mastectomy.   Explained risks, benefits, and alternatives to PPM implantation, including but not limited to bleeding, infection, pneumothorax, pericardial effusion, lead dislodgement, heart attack, stroke, or death.  Pt verbalized understanding and agrees to proceed.    Not on [facility]  Dr. Kennyth to see. Clear breakfast and then will leave NPO for now.   For questions or updates, please contact [facility] Please consult [email or web address] for contact info under     Signed, Ozell Prentice Passey, PA-C  05/07/2024, 7:35 AM    I have seen, examined the patient, and reviewed the above assessment and plan.    HPI: Ms. Cid is a 89 year old female with past medical history notable for left-sided breast cancer status postmastectomy, HTN, HLD, hypothyroidism who presented to the ED with chief complaint of multiple syncopal episodes and exertional fatigue.  She was found to have bradycardia with 2-1 AV block and was transferred to [facility].  At rest she reports feeling okay but is concerned about her ongoing health.  She reports that she was previously very functional and independent, including driving herself prior to the passing out episodes.  General: Frail elderly female, in no acute distress.  Neck: No JVD.  Cardiac: Bradycardic, regular Resp: Normal work of breathing.  Ext: No edema.  Neuro: No gross focal deficits.  Psych: Normal affect.   Assessment: Ms. Robinette is a 89 year old female who presents with high-grade AV block and symptomatic bradycardia.  Plan:  - Patient meets criteria for permanent pacemaker given symptomatic bradycardia and high-grade AV block.  I suspect that her syncopal episodes are related to her conduction disease.  Due to her history of breast cancer, she necessitates a  right sided transvenous device.  Given her low BMI, I discussed with patient and her family that she is higher than normal risk of infection. Explained risks, benefits, and alternatives to pacemaker implantation, including but not limited to bleeding, infection, damage to heart or lungs, heart attack, stroke, or death.  Pt and family members have verbalized understanding and all parties want to proceed.  We will plan for right sided transvenous pacemaker implant today.   Fonda Kitty, MD 05/07/2024 1:51 PM

## 2024-05-08 ENCOUNTER — Inpatient Hospital Stay (HOSPITAL_COMMUNITY)

## 2024-05-08 ENCOUNTER — Encounter (HOSPITAL_COMMUNITY): Payer: Self-pay | Admitting: Cardiology

## 2024-05-08 DIAGNOSIS — Z950 Presence of cardiac pacemaker: Secondary | ICD-10-CM | POA: Diagnosis not present

## 2024-05-08 DIAGNOSIS — E871 Hypo-osmolality and hyponatremia: Secondary | ICD-10-CM | POA: Diagnosis not present

## 2024-05-08 DIAGNOSIS — R001 Bradycardia, unspecified: Secondary | ICD-10-CM | POA: Diagnosis not present

## 2024-05-08 DIAGNOSIS — I700 Atherosclerosis of aorta: Secondary | ICD-10-CM | POA: Diagnosis not present

## 2024-05-08 DIAGNOSIS — I441 Atrioventricular block, second degree: Secondary | ICD-10-CM | POA: Diagnosis not present

## 2024-05-08 LAB — COMPREHENSIVE METABOLIC PANEL WITH GFR
ALT: 20 U/L (ref 0–44)
AST: 28 U/L (ref 15–41)
Albumin: 3.1 g/dL — ABNORMAL LOW (ref 3.5–5.0)
Alkaline Phosphatase: 41 U/L (ref 38–126)
Anion gap: 11 (ref 5–15)
BUN: 15 mg/dL (ref 8–23)
CO2: 22 mmol/L (ref 22–32)
Calcium: 8.8 mg/dL — ABNORMAL LOW (ref 8.9–10.3)
Chloride: 93 mmol/L — ABNORMAL LOW (ref 98–111)
Creatinine, Ser: 0.72 mg/dL (ref 0.44–1.00)
GFR, Estimated: >60 mL/min
Glucose, Bld: 109 mg/dL — ABNORMAL HIGH (ref 70–99)
Potassium: 4.2 mmol/L (ref 3.5–5.1)
Sodium: 126 mmol/L — ABNORMAL LOW (ref 135–145)
Total Bilirubin: 0.6 mg/dL (ref 0.0–1.2)
Total Protein: 6.1 g/dL — ABNORMAL LOW (ref 6.5–8.1)

## 2024-05-08 MED FILL — Lidocaine HCl Local Preservative Free (PF) Inj 1%: INTRAMUSCULAR | Qty: 30 | Status: AC

## 2024-05-08 MED FILL — Midazolam HCl Inj 2 MG/2ML (Base Equivalent): INTRAMUSCULAR | Qty: 1.5 | Status: AC

## 2024-05-08 NOTE — Progress Notes (Addendum)
  Patient Name: Jessica Sherman Date of Encounter: 05/08/2024  Primary Cardiologist: None Electrophysiologist: None  Interval Summary   Feeling good this am. Family not present.   Vital Signs    Vitals:   05/07/24 2200 05/07/24 2330 05/08/24 0337 05/08/24 0400  BP: (!) 143/60 (!) 143/71 (!) 173/78 (!) 161/86  Pulse:   69   Resp:   20   Temp:  97.8 F (36.6 C) 98.4 F (36.9 C)   TempSrc:  Oral Oral   SpO2:  97% 100%   Weight:      Height:        Intake/Output Summary (Last 24 hours) at 05/08/2024 0708 Last data filed at 05/08/2024 0551 Gross per 24 hour  Intake 560 ml  Output 300 ml  Net 260 ml   Filed Weights   05/07/24 0015  Weight: 48.4 kg    Physical Exam    GEN- NAD, Alert and oriented  Lungs- Clear to ausculation bilaterally, normal work of breathing Cardiac- Regular rate and rhythm, no murmurs, rubs or gallops GI- soft, NT, ND, + BS Extremities- no clubbing or cyanosis. No edema  Telemetry    NSR with V pacing (personally reviewed)  Hospital Course    Jessica Sherman is a 89 y.o. female with a history of HTN, HLD, hypothyroidism, Breast CA treated with herceptin  and LEFT mastectomy (stable echos), and IDA who is being seen today for the evaluation of symptomatic bradycardia at the request of DR. Lama.   Assessment & Plan    Advanced AV block S/p Abbott dual chamber PPM CXR stable Interrogation stable. Wound care and restrictions to be reviewed on family arrival.  Usual follow up in place.   Remove pressure dressing in 48 hours.  RN visit next week to review site.  Usually follow up in 10-14 days.   She will be OK for discharge after family has come for teaching.   For questions or updates, please contact [facility] Please consult [email or web address] for contact info under     Signed, Ozell Prentice Passey, PA-C  05/08/2024, 7:08 AM    I have seen, examined the patient, and reviewed the above assessment and plan.     Interval: Patient underwent submuscular right-sided dual-chamber pacemaker implant yesterday.  No acute overnight events.  Patient reports feeling relatively well this morning.  General: Frail elderly female, in no acute distress.  Neck: No JVD.  Cardiac: Normal rate, regular rhythm.  Right sided pacemaker pocket without bleeding or hematoma, some ecchymosis. Resp: Normal work of breathing.  Ext: No edema.  Neuro: No gross focal deficits.  Psych: Normal affect.   Assessment:  #.  Symptomatic bradycardia: #.  High-grade AV block/intermittent complete heart block: #.  Status post right sided submuscular dual-chamber pacemaker on 05/07/2024:  Plan:  - Chest x-ray with appropriate lead positions and no pneumothorax. - Bedside device interrogation performed with appropriate device function and stable lead parameters.  Presenting rhythm is a AS-VP. - Usual post implant instructions regarding activity restrictions and wound care were provided. - Remove right sided pressure dressing in 48 hours. - Given submuscular implant, will have pocket checked by device clinic in 1 week.  Fonda Kitty, MD 05/08/2024 10:34 PM

## 2024-05-08 NOTE — Progress Notes (Signed)
   05/08/24 1425  TOC Brief Assessment  Insurance and Status Reviewed  Patient has primary care physician Yes  Home environment has been reviewed home- has family support  Prior level of function: self  Prior/Current Home Services No current home services  Social Drivers of Health Review SDOH reviewed no interventions necessary  Readmission risk has been reviewed Yes  Transition of care needs no transition of care needs at this time    Pt s/p PPM, no HH or DM needs noted. Family to transport home.

## 2024-05-08 NOTE — Discharge Instructions (Signed)
 After Your Pacemaker   You have a Abbott Pacemaker  ACTIVITY Do not lift your arm above shoulder height for 1 week after your procedure. After 7 days, you may progress as below.  You should remove your sling 24 hours after your procedure, unless otherwise instructed by your provider.     Thursday May 15, 2024  Friday May 16, 2024 Saturday May 17, 2024 Sunday May 18, 2024   Do not lift, push, pull, or carry anything over 10 pounds with the affected arm until 6 weeks (Thursday June 19, 2024 ) after your procedure.   You may drive AFTER your wound check, unless you have been told otherwise by your provider.   Ask your healthcare provider when you can go back to work   INCISION/Dressing If you are on a blood thinner such as Coumadin, Xarelto, Eliquis, Plavix, or Pradaxa please confirm with your provider when this should be resumed.   If large square, outer bandage is left in place, this can be removed after 24 hours from your procedure. Do not remove steri-strips or glue as below.   If a PRESSURE DRESSING (a bulky dressing that usually goes up over your shoulder) was applied or left in place, please follow instructions given by your provider on when to return to have this removed.   Monitor your Pacemaker site for redness, swelling, and drainage. Call the device clinic at [phone] if you experience these symptoms or fever/chills.  If your incision is sealed with Steri-strips or staples, you may shower 7 days after your procedure or when told by your provider. Do not remove the steri-strips or let the shower hit directly on your site. You may wash around your site with soap and water.    If you were discharged in a sling, please do not wear this during the day more than 48 hours after your surgery unless otherwise instructed. This may increase the risk of stiffness and soreness in your shoulder.   Avoid lotions, ointments, or perfumes over your incision until it  is well-healed.  You may use a hot tub or a pool AFTER your wound check appointment if the incision is completely closed.  Pacemaker Alerts:  Some alerts are vibratory and others beep. These are NOT emergencies. Please call our office to let us  know. If this occurs at night or on weekends, it can wait until the next business day. Send a remote transmission.  If your device is capable of reading fluid status (for heart failure), you will be offered monthly monitoring to review this with you.   DEVICE MANAGEMENT Remote monitoring is used to monitor your pacemaker from home. This monitoring is scheduled every 91 days by our office. It allows us  to keep an eye on the functioning of your device to ensure it is working properly. You will routinely see your Electrophysiologist annually (more often if necessary).   You should receive your ID card for your new device in 4-8 weeks. Keep this card with you at all times once received. Consider wearing a medical alert bracelet or necklace.  Your Pacemaker may be MRI compatible. This will be discussed at your next office visit/wound check.  You should avoid contact with strong electric or magnetic fields.   Do not use amateur (ham) radio equipment or electric (arc) welding torches. MP3 player headphones with magnets should not be used. Some devices are safe to use if held at least 12 inches (30 cm) from your Pacemaker. These include power tools, lawn  mowers, and speakers. If you are unsure if something is safe to use, ask your health care provider.  When using your cell phone, hold it to the ear that is on the opposite side from the Pacemaker. Do not leave your cell phone in a pocket over the Pacemaker.  You may safely use electric blankets, heating pads, computers, and microwave ovens.  Call the office right away if: You have chest pain. You feel more short of breath than you have felt before. You feel more light-headed than you have felt before. Your  incision starts to open up.  This information is not intended to replace advice given to you by your health care provider. Make sure you discuss any questions you have with your health care provider.

## 2024-05-08 NOTE — Discharge Summary (Signed)
 Physician Discharge Summary   Patient: Jessica Sherman MRN: [id number] DOB: 11-Jun-1931  Admit date:     05/06/2024  Discharge date: 05/08/24  Discharge Physician: Sabas GORMAN Brod   PCP: Geofm Glade PARAS, MD   Recommendations at discharge:   Follow-up cardiology in 2 weeks Follow-up PCP in 1 week  Discharge Diagnoses: Principal Problem:   Symptomatic bradycardia Active Problems:   2nd degree AV block   Hypertensive urgency  Resolved Problems:   * No resolved hospital problems. *  Hospital Course: 89 y.o. female with medical history significant for HTN, HLD, hypothyroidism, anxiety, breast cancer s/p mastectomy, prediabetes, tongue cancer, vertigo, dysphagia and IDA who presented to the ED for evaluation of syncope. Patient reports over the last few weeks he has had low energy and fatigue. She was eval by her PCP over a week ago, diagnosed with sinusitis and prescribed Amoxil  liquid suspension. She was also found to have low sodium so she has been drinking Gatorade. Over this timeframe, she has had intermittent dizziness and  feeling like I am going to pass out she was recently prescribed Xanax  for anxiety, took half dose last night and another dose this morning. Earlier today, while sitting in her bed, she had another feeling that she was going to pass out and had a brief episode of syncope. Per granddaughter, patient lost consciousness for about 30 to 60 seconds while sitting in bed but did not fall over. When she woke up, she was slightly shaking and afraid but did not have any confusion. Patient had a similar episode over a week ago and EMS was called however vitals were stable and patient was not transported to the ER. Patient currently endorsing fatigue but denies any chest pain, shortness of breath, nausea, vomiting, headache, vision changes, abdominal pain, dysuria, focal weakness, numbness or tingling.   Assessment and Plan:   Symptomatic bradycardia # High-grade AV block -Status  post pacemaker placement - Elderly patient with no cardiac history presenting with a brief syncope episode - Found to have HR in the 30s to 40s, EKG showing 2-1 AV block with RBBB - Patient without any chest pain or shortness of breath, BP elevated but hemodynamically stable - Cardiology consulted, recommends transfer to [facility] for EP evaluation and likely pacemaker placement - Follow-up echocardiogram showed EF of 60 to 65% - Check TSH 2.830, T4 1.21, free T4, troponin and phosphorus 2.9     # Acute on chronic hyponatremia - Sodium of 125 on admission from baseline 129-132 over the last 2 years - Pt slightly dry on exam with recent dizzy spells and fatigue but at baseline mental status - S/p IV NS 500 cc bolus in the ED, repeat sodium still at 125 - Give additional 500 cc bolus of IVNS and start 100 cc/h infusion for 10 hours - Urine osmolality 683 -Follow serum sodium level at PCP office in 2 weeks   # QT prolongation - EKG on admission showed QTc of 555 likely in the setting of AV block -Status post pacemaker placement as above -   # Hypertensive urgency -Resolved Continue home medications   # HLD - Continue simvastatin    # Hypothyroidism - Continue levothyroxine    # Anxiety - Continue Xanax  as needed             Consultants:  Procedures performed:  Disposition: Home Diet recommendation:  Discharge Diet Orders (From admission, onward)     Start     Ordered   05/08/24 0000  Diet -  low sodium heart healthy        05/08/24 1328           Regular diet DISCHARGE MEDICATION: Allergies as of 05/08/2024       Reactions   Other Nausea And Vomiting   Anesthesia makes me very sick, Please pre-medicate to control nausea and vomiting        Medication List     TAKE these medications    ALPRAZolam  0.25 MG tablet Commonly known as: XANAX  Take 1 tablet (0.25 mg total) by mouth 2 (two) times daily as needed for anxiety.   CALCIUM  600+D3 PO Take 1 tablet  by mouth 2 (two) times daily.   levothyroxine  75 MCG tablet Commonly known as: SYNTHROID  Take 1 tablet (75 mcg total) by mouth daily.   meclizine  25 MG tablet Commonly known as: ANTIVERT  Take 1 tablet (25 mg total) by mouth every 8 (eight) hours as needed for dizziness   PRESERVISION AREDS PO Take by mouth in the morning and at bedtime.   simvastatin  20 MG tablet Commonly known as: ZOCOR  Take 1 tablet (20 mg total) by mouth at bedtime. What changed: when to take this   valsartan  40 MG tablet Commonly known as: DIOVAN  Take 0.5 tablets (20 mg total) by mouth daily.   Vitamin D3 25 MCG (1000 UT) Caps Take 1,000 Units by mouth daily.        Follow-up Information     Geofm Glade PARAS, MD Follow up in 2 week(s).   Specialty: Internal Medicine Contact information: [address] [place] [address] [phone]         Kennyth Chew, MD. Schedule an appointment as soon as possible for a visit.   Specialties: Cardiology, Radiology Contact information: [address] [place] [address] [phone]                Discharge Exam: Fredricka Weights   05/07/24 0015  Weight: 48.4 kg   Appears in no acute distress S1-S2, regular Lungs clear to auscultation bilaterally Abdomen is soft, nontender, no organomegaly  Condition at discharge: good  The results of significant diagnostics from this hospitalization (including imaging, microbiology, ancillary and laboratory) are listed below for reference.   Imaging Studies: DG Chest 2 View Result Date: 05/08/2024 EXAM: 2 VIEW(S) XRAY OF THE CHEST 05/08/2024 06:05:49 AM COMPARISON: 05/06/2024 CLINICAL HISTORY: Cardiac device in situ, other [id number]. S/P pacemaker 05/07/24; Evaluate; pneumothorax FINDINGS: LUNGS AND PLEURA: No pneumothorax identified. Biapical scarring. HEART AND MEDIASTINUM: New right chest dual-chamber pacemaker with leads in right atrium and ventricle. Aortic atherosclerotic calcifications.  BONES AND SOFT TISSUES: Surgical clips in left axilla. Multilevel degenerative changes of thoracic spine. IMPRESSION: 1. No pneumothorax identified. 2. New right chest dual-chamber pacemaker with leads in right atrium and right ventricle. Electronically signed by: Waddell Calk MD 05/08/2024 06:43 AM EDT RP Workstation: [id number]   EP PPM/ICD IMPLANT Result Date: 05/07/2024  CONCLUSIONS: 1. Symptomatic bradycardia and intermittent complete heart block. 2. Successful sub-muscular right sided dual chamber pacemaker implant. 3.  No early apparent complications. Chew Kennyth, MD, [facility], [facility] Cardiac Electrophysiology   ECHOCARDIOGRAM COMPLETE Result Date: 05/07/2024    ECHOCARDIOGRAM REPORT   Patient Name:   PENNIE VANBLARCOM Date of Exam: 05/07/2024 Medical Rec #:  [id number]           Height:       62.0 in Accession #:    [id number]          Weight:  106.7 lb Date of Birth:  August 07, 1931            BSA:          1.464 m Patient Age:    89 years            BP:           146/45 mmHg Patient Gender: F                   HR:           43 bpm. Exam Location:  Inpatient Procedure: 2D Echo, Color Doppler and Cardiac Doppler (Both Spectral and Color            Flow Doppler were utilized during procedure). Indications:    Syncope, heart block, RBBB  History:        Patient has prior history of Echocardiogram examinations, most                 recent 05/31/2017. Arrythmias:RBBB and Bradycardia,                 Signs/Symptoms:Syncope, Dizziness/Lightheadedness and Fatigue;                 Risk Factors:Hypertension.  Sonographer:    VALENTE, ADAM Referring Phys: [id number] PROSPER M AMPONSAH IMPRESSIONS  1. Left ventricular ejection fraction, by estimation, is 60 to 65%. The left ventricle has normal function. The left ventricle has no regional wall motion abnormalities. Left ventricular diastolic function could not be evaluated.  2. Right ventricular systolic function is normal. The right ventricular size is normal.  3. The  mitral valve is normal in structure. Mild mitral valve regurgitation. No evidence of mitral stenosis.  4. The aortic valve is normal in structure. Aortic valve regurgitation is trivial. No aortic stenosis is present.  5. The inferior vena cava is normal in size with greater than 50% respiratory variability, suggesting right atrial pressure of 3 mmHg. FINDINGS  Left Ventricle: Left ventricular ejection fraction, by estimation, is 60 to 65%. The left ventricle has normal function. The left ventricle has no regional wall motion abnormalities. The left ventricular internal cavity size was normal in size. There is  no left ventricular hypertrophy. Left ventricular diastolic function could not be evaluated due to nondiagnostic images. Left ventricular diastolic function could not be evaluated. Right Ventricle: The right ventricular size is normal. No increase in right ventricular wall thickness. Right ventricular systolic function is normal. Left Atrium: Left atrial size was normal in size. Right Atrium: Right atrial size was normal in size. Pericardium: There is no evidence of pericardial effusion. Mitral Valve: The mitral valve is normal in structure. Mild mitral valve regurgitation. No evidence of mitral valve stenosis. Tricuspid Valve: The tricuspid valve is normal in structure. Tricuspid valve regurgitation is trivial. No evidence of tricuspid stenosis. Aortic Valve: The aortic valve is normal in structure. Aortic valve regurgitation is trivial. No aortic stenosis is present. Pulmonic Valve: The pulmonic valve was normal in structure. Pulmonic valve regurgitation is not visualized. No evidence of pulmonic stenosis. Aorta: The aortic root is normal in size and structure. Venous: The inferior vena cava is normal in size with greater than 50% respiratory variability, suggesting right atrial pressure of 3 mmHg. IAS/Shunts: No atrial level shunt detected by color flow Doppler.  LEFT VENTRICLE PLAX 2D LVIDd:         4.00  cm     Diastology LVIDs:         2.70  cm     LV e' medial:    10.20 cm/s LV PW:         0.90 cm     LV E/e' medial:  13.2 LV IVS:        1.00 cm     LV e' lateral:   13.20 cm/s LVOT diam:     2.00 cm     LV E/e' lateral: 10.2 LV SV:         112 LV SV Index:   76 LVOT Area:     3.14 cm  LV Volumes (MOD) LV vol d, MOD A2C: 37.7 ml LV vol d, MOD A4C: 43.9 ml LV vol s, MOD A2C: 11.0 ml LV vol s, MOD A4C: 16.9 ml LV SV MOD A2C:     26.7 ml LV SV MOD A4C:     43.9 ml LV SV MOD BP:      28.1 ml RIGHT VENTRICLE RV Basal diam:  3.50 cm RV Mid diam:    2.80 cm LEFT ATRIUM           Index        RIGHT ATRIUM           Index LA diam:      3.50 cm 2.39 cm/m   RA Area:     16.00 cm LA Vol (A2C): 22.3 ml 15.24 ml/m  RA Volume:   46.10 ml  31.50 ml/m LA Vol (A4C): 22.7 ml 15.51 ml/m  AORTIC VALVE LVOT Vmax:   149.00 cm/s LVOT Vmean:  89.500 cm/s LVOT VTI:    0.355 m  AORTA Ao Root diam: 3.30 cm MITRAL VALVE MV Area (PHT): 5.84 cm     SHUNTS MV Decel Time: 130 msec     Systemic VTI:  0.36 m MV E velocity: 135.00 cm/s  Systemic Diam: 2.00 cm MV A velocity: 73.00 cm/s MV E/A ratio:  1.85 Aditya Sabharwal Electronically signed by Ria Commander Signature Date/Time: 05/07/2024/4:55:19 PM    Final    DG Chest Portable 1 View Result Date: 05/06/2024 CLINICAL DATA:  Syncope and weakness EXAM: PORTABLE CHEST 1 VIEW COMPARISON:  03/28/2019 FINDINGS: Mild cardiac enlargement. No vascular congestion, edema, or consolidation. Scarring in the lung apices. No airspace disease or consolidation. Calcification of the aorta. Degenerative changes in the spine and shoulders. IMPRESSION: Mild cardiac enlargement. Scarring in the lung apices. No evidence of active pulmonary disease. Electronically Signed   By: Elsie Gravely M.D.   On: 05/06/2024 21:16    Microbiology: Results for orders placed or performed during the hospital encounter of 05/06/24  Surgical PCR screen     Status: Abnormal   Collection Time: 05/07/24 12:20 PM    Specimen: Nasal Mucosa; Nasal Swab  Result Value Ref Range Status   MRSA, PCR NEGATIVE NEGATIVE Final   Staphylococcus aureus POSITIVE (A) NEGATIVE Final    Comment: (NOTE) The Xpert SA Assay (FDA approved for NASAL specimens in patients 47 years of age and older), is one component of a comprehensive surveillance program. It is not intended to diagnose infection nor to guide or monitor treatment. Performed at [facility], [address] [place], [place] [address]     Labs: CBC: Recent Labs  Lab 05/06/24 2013 05/07/24 0307  WBC 10.2 10.5  HGB 11.9* 11.4*  HCT 37.1 32.5*  MCV 97.6 96.2  PLT 296 304   Basic Metabolic Panel: Recent Labs  Lab 05/06/24 2013 05/06/24 2307 05/07/24 0307 05/08/24 0349  NA 125*  125* 125* 126*  K 5.1  --  4.5 4.2  CL 88*  --  94* 93*  CO2 24  --  21* 22  GLUCOSE 128*  --  134* 109*  BUN 18  --  16 15  CREATININE 0.73  --  0.66 0.72  CALCIUM  9.5  --  8.9 8.8*  MG 2.2  --   --   --   PHOS  --  2.9  --   --    Liver Function Tests: Recent Labs  Lab 05/06/24 2307 05/08/24 0349  AST 29 28  ALT 20 20  ALKPHOS 46 41  BILITOT 0.5 0.6  PROT 6.3* 6.1*  ALBUMIN 3.8 3.1*   CBG: No results for input(s): GLUCAP in the last 168 hours.  Discharge time spent: greater than 30 minutes.  Signed: Sabas GORMAN Brod, MD [facility] 05/08/2024

## 2024-05-08 NOTE — Plan of Care (Signed)
  Problem: Clinical Measurements: Goal: Ability to maintain clinical measurements within normal limits will improve Outcome: Progressing Goal: Cardiovascular complication will be avoided Outcome: Progressing   Problem: Coping: Goal: Level of anxiety will decrease Outcome: Progressing   Problem: Pain Managment: Goal: General experience of comfort will improve and/or be controlled Outcome: Progressing   Problem: Education: Goal: Knowledge of cardiac device and self-care will improve Outcome: Progressing Goal: Ability to safely manage health related needs after discharge will improve Outcome: Progressing Goal: Individualized Educational Video(s) Outcome: Progressing   Problem: Cardiac: Goal: Ability to achieve and maintain adequate cardiopulmonary perfusion will improve Outcome: Progressing

## 2024-05-13 ENCOUNTER — Ambulatory Visit: Attending: Student | Admitting: Student

## 2024-05-13 ENCOUNTER — Encounter: Payer: Self-pay | Admitting: Student

## 2024-05-13 DIAGNOSIS — I441 Atrioventricular block, second degree: Secondary | ICD-10-CM

## 2024-05-13 NOTE — Patient Instructions (Signed)
 Medication Instructions:  Your physician recommends that you continue on your current medications as directed. Please refer to the Current Medication list given to you today.  *If you need a refill on your cardiac medications before your next appointment, please call your pharmacy*  Lab Work: None ordered  If you have labs (blood work) drawn today and your tests are completely normal, you will receive your results only by: MyChart Message (if you have MyChart) OR A paper copy in the mail If you have any lab test that is abnormal or we need to change your treatment, we will call you to review the results.  Testing/Procedures: None ordered  Follow-Up: At [facility], you and your health needs are our priority.  As part of our continuing mission to provide you with exceptional heart care, our providers are all part of one team.  This team includes your primary Cardiologist (physician) and Advanced Practice Providers or APPs (Physician Assistants and Nurse Practitioners) who all work together to provide you with the care you need, when you need it.  Your next appointment:   As scheduled   Provider:   Fonda Kitty, MD    We recommend signing up for the patient portal called MyChart.  Sign up information is provided on this After Visit Summary.  MyChart is used to connect with patients for Virtual Visits (Telemedicine).  Patients are able to view lab/test results, encounter notes, upcoming appointments, etc.  Non-urgent messages can be sent to your provider as well.   To learn more about what you can do with MyChart, go to [email or web address].   Other Instructions

## 2024-05-13 NOTE — Progress Notes (Signed)
 Pressure dressing removed without issue.   Site well healing without significant ecchymosis or hematoma.   On-going wound care and arm restrictions reviewed.   Ozell Jodie Passey, PA-C  05/13/2024 12:26 PM

## 2024-05-15 ENCOUNTER — Encounter: Payer: Self-pay | Admitting: Internal Medicine

## 2024-05-15 ENCOUNTER — Telehealth: Payer: Self-pay

## 2024-05-15 NOTE — Progress Notes (Unsigned)
 Subjective:    Patient ID: Jessica Sherman, female    DOB: Mar 04, 1931, 89 y.o.   MRN: [id number]     HPI Jessica Sherman is here for follow up of her chronic medical problems.from the hospital  Admitted 8/27 - 8/28  She is brought to the ED by family after syncope.  States few weeks of low energy and fatigue.  She was also experiencing intermittent dizziness-feeling like she was going to pass out.  Earlier that day she was sitting on her bed and felt like she was going to pass out and had a brief episode of syncope.  Her granddaughter states she lost consciousness for 30-60 seconds while sitting in bed-she did not fall over.  She was shaking slightly when she came to but was not confused.  She had a similar episode over a week ago and EMS was called-she was stable and was not transported to ED.  She denies chest pain, shortness of breath, nausea, headache, blurry vision, abdominal pain, focal weakness or dysuria.  In ED heart rate 38-42.  SBP 180s-200s.  Sodium 125.  EKG 2: 1 heart block, RBBB, LVH with prolonged QTc.  Chest x-ray without acute disease.  She received IVF and cardiology was consulted.  Symptomatic bradycardia, high grade AV block Acute, S/p pacemaker EKG 2:1 AV block  Cardiology [facility]  Echo - EF 60-65% Pacemaker placed  Acute on chronic hyponatremia Na 125 on admission - baseline 129-132 Slightly dehydrated on admission Received 500 cc IVF - Na stable at 125 Additional 500 cc IV NS given Urine osm 683 F/u Na as outpatient  QT prolongation - QTc 555 likely in setting of AV block S/p PPM  Hypertensive urgency Resolved Continued home meds  HLD, hypothyroid, anxiety -  Stable  Continued home meds   [facility] cardiology  - EP 9/2 for wound check.      Medications and allergies reviewed with patient and updated if appropriate.  Current Outpatient Medications on File Prior to Visit  Medication Sig Dispense Refill    ALPRAZolam  (XANAX ) 0.25 MG tablet Take 1 tablet (0.25 mg total) by mouth 2 (two) times daily as needed for anxiety. 30 tablet 2   Calcium  Carb-Cholecalciferol  (CALCIUM  600+D3 PO) Take 1 tablet by mouth 2 (two) times daily.     Cholecalciferol  (VITAMIN D3) 1000 UNITS CAPS Take 1,000 Units by mouth daily.     levothyroxine  (SYNTHROID ) 75 MCG tablet Take 1 tablet (75 mcg total) by mouth daily. 90 tablet 2   meclizine  (ANTIVERT ) 25 MG tablet Take 1 tablet (25 mg total) by mouth every 8 (eight) hours as needed for dizziness 40 tablet 2   Multiple Vitamins-Minerals (PRESERVISION AREDS PO) Take by mouth in the morning and at bedtime. (Patient not taking: Reported on 05/06/2024)     simvastatin  (ZOCOR ) 20 MG tablet Take 1 tablet (20 mg total) by mouth at bedtime. (Patient taking differently: Take 20 mg by mouth at bedtime.) 90 tablet 2   valsartan  (DIOVAN ) 40 MG tablet Take 0.5 tablets (20 mg total) by mouth daily.     [DISCONTINUED] prochlorperazine  (COMPAZINE ) 10 MG tablet Take 1 tablet (10 mg total) by mouth every 6 (six) hours as needed (Nausea or vomiting). (Patient not taking: Reported on 07/27/2016) 30 tablet 1   No current facility-administered medications on file prior to visit.     Review of Systems     Objective:  There were no vitals filed for this visit.  BP Readings from Last 3 Encounters:  05/08/24 (!) 162/82  04/25/24 (!) 168/82  02/01/24 102/72   Wt Readings from Last 3 Encounters:  05/07/24 106 lb 11.2 oz (48.4 kg)  04/25/24 105 lb 12.8 oz (48 kg)  02/01/24 102 lb 3.2 oz (46.4 kg)   There is no height or weight on file to calculate BMI.    Physical Exam     Lab Results  Component Value Date   WBC 10.5 05/07/2024   HGB 11.4 (L) 05/07/2024   HCT 32.5 (L) 05/07/2024   PLT 304 05/07/2024   GLUCOSE 109 (H) 05/08/2024   CHOL 160 02/01/2024   TRIG 79.0 02/01/2024   HDL 77.50 02/01/2024   LDLDIRECT 57.0 01/27/2019   LDLCALC 67 02/01/2024   ALT 20 05/08/2024   AST 28  05/08/2024   NA 126 (L) 05/08/2024   K 4.2 05/08/2024   CL 93 (L) 05/08/2024   CREATININE 0.72 05/08/2024   BUN 15 05/08/2024   CO2 22 05/08/2024   TSH 2.830 05/06/2024   INR 0.9 04/17/2019   HGBA1C 6.3 04/25/2024     Assessment & Plan:    See Problem List for Assessment and Plan of chronic medical problems.

## 2024-05-15 NOTE — Patient Instructions (Incomplete)
     Medications changes include :   None    Monitor your blood pressure at home and keep a log of your readings.    Follow up in November as scheduled.

## 2024-05-15 NOTE — Telephone Encounter (Signed)
 Spoke with daughter about not getting pt transmission. Daughter will call the sitter for the pt and let her know how to send a transmission

## 2024-05-16 ENCOUNTER — Ambulatory Visit (INDEPENDENT_AMBULATORY_CARE_PROVIDER_SITE_OTHER): Admitting: Internal Medicine

## 2024-05-16 ENCOUNTER — Ambulatory Visit

## 2024-05-16 ENCOUNTER — Telehealth: Payer: Self-pay | Admitting: *Deleted

## 2024-05-16 VITALS — BP 138/72 | HR 70 | Temp 98.0°F | Ht 62.0 in | Wt 102.0 lb

## 2024-05-16 DIAGNOSIS — I160 Hypertensive urgency: Secondary | ICD-10-CM | POA: Diagnosis not present

## 2024-05-16 DIAGNOSIS — I10 Essential (primary) hypertension: Secondary | ICD-10-CM

## 2024-05-16 DIAGNOSIS — E871 Hypo-osmolality and hyponatremia: Secondary | ICD-10-CM

## 2024-05-16 DIAGNOSIS — I441 Atrioventricular block, second degree: Secondary | ICD-10-CM

## 2024-05-16 DIAGNOSIS — F419 Anxiety disorder, unspecified: Secondary | ICD-10-CM | POA: Diagnosis not present

## 2024-05-16 NOTE — Assessment & Plan Note (Signed)
 Chronic Blood pressure elevated here today and has been elevated on a few occasions, but better on repeat I do not want to increase her medication unless it is necessary due to risk of hypotension Monitor BP at home regularly and keep a log Continue valsartan  to 20 mg daily

## 2024-05-16 NOTE — Assessment & Plan Note (Signed)
 Resolved BP slightly elevated, but better on repeat She will monitor Continue valsartan  20 mg daily

## 2024-05-16 NOTE — Telephone Encounter (Addendum)
 High alert received from [employer]:  Unscheduled remote transmission: Clinic requested transmission Normal device function.  19 AMS EGM's c/w AF/AFL, no hx of AF noted - route to triage high alert Presenting AS/VP Follow up as scheduled [place], CV[facility] _______________________________________________________________________________  Presenting EGM on transmission c/w SR    3 AF/AFL episodes noted >6 hrs  Patient has no documented history of AF/AFL and is not on an [facility]   Called and spoke with patient and her daughter   Pt denied experiencing any symptoms during AF/AFL episodes  Discussed what AF/AFL was and the increased risk of stroke with patient and her daughter and strongly suggested an appointment with our AF clinic to initiate treatment  Patient and her daughter were agreeable to this  Only available appointment next week was on Monday and pt has wound check visit on Tuesday  Asked if daughter could get patient to clinic on Monday for AF appointment and we could reschedule her wound check to happen on Monday right before her AF appointment and save them an extra drive  They were agreeable and very grateful for this idea  Patient rescheduled in device clinic on 9/8 at 1230 and then in AF clinic at 1330  ED precautions reviewed with patient and daughter going into weekend and they both verbalized understanding  Routing to Kennyth, MD for review and awareness

## 2024-05-16 NOTE — Assessment & Plan Note (Signed)
 New Recently got pacemaker placed Still feeling fatigued and weak, but denies any lightheadedness, dizziness, palpitations, chest pain or shortness of breath Wound check 3 days ago has follow-up with EPS scheduled

## 2024-05-16 NOTE — Assessment & Plan Note (Signed)
 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 intake and dehydration She is eating is much as she can and will try to increase how much she is eating and drinking plenty of fluids Will hold off on blood work today to monitor since this is chronic and has been relatively stable

## 2024-05-16 NOTE — Assessment & Plan Note (Signed)
 Chronic Controlled Continue alprazolam  0.25 mg twice daily as needed

## 2024-05-19 ENCOUNTER — Ambulatory Visit (INDEPENDENT_AMBULATORY_CARE_PROVIDER_SITE_OTHER): Admitting: *Deleted

## 2024-05-19 ENCOUNTER — Other Ambulatory Visit (HOSPITAL_COMMUNITY): Payer: Self-pay

## 2024-05-19 ENCOUNTER — Encounter (HOSPITAL_COMMUNITY): Payer: Self-pay | Admitting: Physician Assistant

## 2024-05-19 ENCOUNTER — Other Ambulatory Visit: Payer: Self-pay

## 2024-05-19 ENCOUNTER — Ambulatory Visit (HOSPITAL_COMMUNITY)
Admission: RE | Admit: 2024-05-19 | Discharge: 2024-05-19 | Disposition: A | Discharge status: Home / Self Care | Source: Ambulatory Visit | Attending: Physician Assistant | Admitting: Physician Assistant

## 2024-05-19 VITALS — BP 150/84 | HR 69 | Ht 62.0 in | Wt 101.0 lb

## 2024-05-19 DIAGNOSIS — D6869 Other thrombophilia: Secondary | ICD-10-CM

## 2024-05-19 DIAGNOSIS — I480 Paroxysmal atrial fibrillation: Secondary | ICD-10-CM | POA: Diagnosis not present

## 2024-05-19 DIAGNOSIS — I4891 Unspecified atrial fibrillation: Secondary | ICD-10-CM | POA: Diagnosis not present

## 2024-05-19 DIAGNOSIS — I441 Atrioventricular block, second degree: Secondary | ICD-10-CM | POA: Diagnosis not present

## 2024-05-19 MED ORDER — APIXABAN 2.5 MG PO TABS
2.5000 mg | ORAL_TABLET | Freq: Two times a day (BID) | ORAL | 6 refills | Status: AC
  Filled 2024-05-19: qty 60, 30d supply, fill #0
  Filled 2024-06-15: qty 60, 30d supply, fill #1
  Filled 2024-07-13: qty 60, 30d supply, fill #2
  Filled 2024-08-13: qty 60, 30d supply, fill #3
  Filled 2024-09-07: qty 60, 30d supply, fill #4
  Filled 2024-10-08: qty 60, 30d supply, fill #5

## 2024-05-19 NOTE — Patient Instructions (Signed)

## 2024-05-19 NOTE — Progress Notes (Signed)
 Normal dual chamber pacemaker wound check. Presenting rhythm: AS/VP. Wound well healed. Routine testing performed. Thresholds, sensing, and impedance consistent with implant measurements and at 3.5V safety margin/auto capture until 3 month visit. Multiple AMS episodes noted with several c/w AF/AFL >6hrs in length. Patient has AF clinic visit immediately after this appointment. Reviewed arm restrictions to continue for 6 weeks total post op.  Pt enrolled in remote follow-up.

## 2024-05-19 NOTE — Progress Notes (Signed)
 Primary Care Physician: Geofm Glade PARAS, MD Primary Cardiologist: None Electrophysiologist: Fonda Kitty, MD  Referring Physician: Device Clinic/Dr Kitty Gilbert [name] is a 89 y.o. female with a history of HTN, HLD, hypothyroidism, breast cancer, IDA, AV block/CHB s/p PPM, atrial fibrillation who presents for follow up in the [facility].  The patient presented to the ED 05/06/24 with syncope, found to have 2:1 AV block with intermittent CHB. She is s/p PPM implant. The device clinic received an alert 9/5 for new onset afib. The longest episode was 16.5 hours, controlled V rates.   Patient presents today for follow up for atrial fibrillation. She is in SR today and feels well. She states her strength is slowly returning after her recent hospitalization. She was unaware of her afib.   Today, she denies symptoms of palpitations, chest pain, shortness of breath, orthopnea, PND, lower extremity edema, dizziness, presyncope, syncope, snoring, daytime somnolence, bleeding, or neurologic sequela. The patient is tolerating medications without difficulties and is otherwise without complaint today.    Atrial Fibrillation Risk Factors:  she does not have symptoms or diagnosis of sleep apnea. she does not have a history of rheumatic fever.   Atrial Fibrillation Management history:  Previous antiarrhythmic drugs: none Previous cardioversions: none Previous ablations: none Anticoagulation history: none  ROS- All systems are reviewed and negative except as per the HPI above.  Past Medical History:  Diagnosis Date   Hx of colonic polyps    Hyperlipidemia    Hypothyroidism    Macular degeneration, dry    Osteopenia    spine BMD T score - 1.69   PONV (postoperative nausea and vomiting)    pt has vertigo and can not lie flat   Prediabetes    FBS 136  on 08/29/2010; A1c 6.2%   Tongue cancer (HCC)    Vertigo     Current Outpatient Medications   Medication Sig Dispense Refill   ALPRAZolam  (XANAX ) 0.25 MG tablet Take 1 tablet (0.25 mg total) by mouth 2 (two) times daily as needed for anxiety. 30 tablet 2   Calcium  Carb-Cholecalciferol  (CALCIUM  600+D3 PO) Take 1 tablet by mouth 2 (two) times daily.     Cholecalciferol  (VITAMIN D3) 1000 UNITS CAPS Take 1,000 Units by mouth daily.     levothyroxine  (SYNTHROID ) 75 MCG tablet Take 1 tablet (75 mcg total) by mouth daily. 90 tablet 2   meclizine  (ANTIVERT ) 25 MG tablet Take 1 tablet (25 mg total) by mouth every 8 (eight) hours as needed for dizziness 40 tablet 2   Multiple Vitamins-Minerals (PRESERVISION AREDS PO) Take by mouth in the morning and at bedtime.     simvastatin  (ZOCOR ) 20 MG tablet Take 1 tablet (20 mg total) by mouth at bedtime. (Patient taking differently: Take 20 mg by mouth at bedtime.) 90 tablet 2   valsartan  (DIOVAN ) 40 MG tablet Take 0.5 tablets (20 mg total) by mouth daily.     No current facility-administered medications for this encounter.    Physical Exam: There were no vitals taken for this visit.  GEN: Well nourished, well developed in no acute distress CARDIAC: Regular rate and rhythm, no murmurs, rubs, gallops RESPIRATORY:  Clear to auscultation without rales, wheezing or rhonchi  ABDOMEN: Soft, non-tender, non-distended EXTREMITIES:  No edema; No deformity   Wt Readings from Last 3 Encounters:  05/16/24 46.3 kg  05/07/24 48.4 kg  04/25/24 48 kg     EKG today demonstrates  A sense V paced  rhythm Vent. rate 69 BPM PR interval 188 ms QRS duration 154 ms QT/QTcB 454/486 ms   Echo 05/07/24 demonstrated   1. Left ventricular ejection fraction, by estimation, is 60 to 65%. The  left ventricle has normal function. The left ventricle has no regional  wall motion abnormalities. Left ventricular diastolic function could not  be evaluated.   2. Right ventricular systolic function is normal. The right ventricular  size is normal.   3. The mitral valve is  normal in structure. Mild mitral valve  regurgitation. No evidence of mitral stenosis.   4. The aortic valve is normal in structure. Aortic valve regurgitation is  trivial. No aortic stenosis is present.   5. The inferior vena cava is normal in size with greater than 50%  respiratory variability, suggesting right atrial pressure of 3 mmHg.    CHA2DS2-VASc Score = 4  The patient's score is based upon: CHF History: 0 HTN History: 1 Diabetes History: 0 Stroke History: 0 Vascular Disease History: 0 Age Score: 2 Gender Score: 1       ASSESSMENT AND PLAN: Paroxysmal Atrial Fibrillation (ICD10:  I48.0) The patient's CHA2DS2-VASc score is 4, indicating a 4.8% annual risk of stroke.   General education about afib provided and questions answered. We also discussed her stroke risk and the risks and benefits of anticoagulation. Start Eliquis  2.5 mg BID (age, weight) Will continue to monitor afib burden on PPM.  Secondary Hypercoagulable State (ICD10:  D68.69) The patient is at significant risk for stroke/thromboembolism based upon her CHA2DS2-VASc Score of 4.  Start Apixaban  (Eliquis ).   Advanced AV block S/p PPM, followed by Dr Kennyth  HTN Stable on current regimen   Follow up in the AF clinic in 4-6 weeks.        [facility] [address] [place], [place] [address] [phone]

## 2024-05-19 NOTE — Telephone Encounter (Signed)
 Patient has appointment with R. Fenton PA on 05/19/24.

## 2024-05-19 NOTE — Patient Instructions (Addendum)
Start Eliquis 2.5 mg twice a day

## 2024-05-20 ENCOUNTER — Ambulatory Visit

## 2024-05-21 ENCOUNTER — Other Ambulatory Visit: Payer: Self-pay

## 2024-06-06 ENCOUNTER — Ambulatory Visit
Admission: RE | Admit: 2024-06-06 | Discharge: 2024-06-06 | Disposition: A | Discharge status: Home / Self Care | Source: Ambulatory Visit | Attending: Otolaryngology | Admitting: Otolaryngology

## 2024-06-06 DIAGNOSIS — I10 Essential (primary) hypertension: Secondary | ICD-10-CM | POA: Diagnosis not present

## 2024-06-06 DIAGNOSIS — R55 Syncope and collapse: Secondary | ICD-10-CM | POA: Diagnosis not present

## 2024-06-06 DIAGNOSIS — I6521 Occlusion and stenosis of right carotid artery: Secondary | ICD-10-CM | POA: Diagnosis not present

## 2024-06-06 DIAGNOSIS — R42 Dizziness and giddiness: Secondary | ICD-10-CM | POA: Diagnosis not present

## 2024-06-15 ENCOUNTER — Other Ambulatory Visit (HOSPITAL_COMMUNITY): Payer: Self-pay

## 2024-06-16 ENCOUNTER — Other Ambulatory Visit (HOSPITAL_COMMUNITY): Payer: Self-pay

## 2024-06-16 DIAGNOSIS — H353221 Exudative age-related macular degeneration, left eye, with active choroidal neovascularization: Secondary | ICD-10-CM | POA: Diagnosis not present

## 2024-06-19 ENCOUNTER — Ambulatory Visit

## 2024-06-19 DIAGNOSIS — I441 Atrioventricular block, second degree: Secondary | ICD-10-CM | POA: Diagnosis not present

## 2024-06-20 LAB — CUP PACEART REMOTE DEVICE CHECK
Battery Remaining Longevity: 92 mo
Battery Remaining Percentage: 95.5 %
Battery Voltage: 3.02 V
Brady Statistic AP VP Percent: 38 %
Brady Statistic AP VS Percent: 1 %
Brady Statistic AS VP Percent: 62 %
Brady Statistic AS VS Percent: 1 %
Brady Statistic RA Percent Paced: 37 %
Brady Statistic RV Percent Paced: 99 %
Date Time Interrogation Session: 20251009020017
Implantable Lead Connection Status: 753985
Implantable Lead Connection Status: 753985
Implantable Lead Implant Date: 20250827
Implantable Lead Implant Date: 20250827
Implantable Lead Location: 753859
Implantable Lead Location: 753860
Implantable Pulse Generator Implant Date: 20250827
Lead Channel Impedance Value: 410 Ohm
Lead Channel Impedance Value: 590 Ohm
Lead Channel Pacing Threshold Amplitude: 0.5 V
Lead Channel Pacing Threshold Amplitude: 0.75 V
Lead Channel Pacing Threshold Pulse Width: 0.5 ms
Lead Channel Pacing Threshold Pulse Width: 0.5 ms
Lead Channel Sensing Intrinsic Amplitude: 12 mV
Lead Channel Sensing Intrinsic Amplitude: 5 mV
Lead Channel Setting Pacing Amplitude: 0.75 V
Lead Channel Setting Pacing Amplitude: 3.5 V
Lead Channel Setting Pacing Pulse Width: 0.5 ms
Lead Channel Setting Sensing Sensitivity: 4 mV
Pulse Gen Model: 2272
Pulse Gen Serial Number: 8296346

## 2024-06-24 NOTE — Progress Notes (Signed)
 Remote PPM Transmission

## 2024-06-30 ENCOUNTER — Ambulatory Visit (HOSPITAL_COMMUNITY)
Admission: RE | Admit: 2024-06-30 | Discharge: 2024-06-30 | Disposition: A | Discharge status: Home / Self Care | Source: Ambulatory Visit | Attending: Physician Assistant | Admitting: Physician Assistant

## 2024-06-30 VITALS — BP 170/80 | HR 68 | Ht 62.0 in | Wt 102.6 lb

## 2024-06-30 DIAGNOSIS — I4891 Unspecified atrial fibrillation: Secondary | ICD-10-CM | POA: Diagnosis not present

## 2024-06-30 DIAGNOSIS — I480 Paroxysmal atrial fibrillation: Secondary | ICD-10-CM

## 2024-06-30 DIAGNOSIS — D6869 Other thrombophilia: Secondary | ICD-10-CM

## 2024-06-30 NOTE — Progress Notes (Signed)
 Primary Care Physician: Jessica Glade PARAS, MD Primary Cardiologist: None Electrophysiologist: Jessica Kitty, MD  Referring Physician: Device Clinic/Jessica [name] is a 89 y.o. female with a history of HTN, HLD, hypothyroidism, breast cancer, IDA, AV block/CHB s/p PPM, atrial fibrillation who presents for follow up in the [facility].  The patient presented to the ED 05/06/24 with syncope, found to have 2:1 AV block with intermittent CHB. She is s/p PPM implant. The device clinic received an alert 9/5 for new onset afib. The longest episode was 16.5 hours, controlled V rates. She was started on Eliquis  for stroke prevention.    Patient returns for follow up for atrial fibrillation. She remains in SR today. She denies any bleeding issues since starting anticoagulation. Her PPM shows only two 6 second episodes since her last visit.   Today, she  denies symptoms of palpitations, chest pain, shortness of breath, orthopnea, PND, lower extremity edema, dizziness, presyncope, syncope, snoring, daytime somnolence, bleeding, or neurologic sequela. The patient is tolerating medications without difficulties and is otherwise without complaint today.    Atrial Fibrillation Risk Factors:  she does not have symptoms or diagnosis of sleep apnea. she does not have a history of rheumatic fever.   Atrial Fibrillation Management history:  Previous antiarrhythmic drugs: none Previous cardioversions: none Previous ablations: none Anticoagulation history: Eliquis   ROS- All systems are reviewed and negative except as per the HPI above.  Past Medical History:  Diagnosis Date   Hx of colonic polyps    Hyperlipidemia    Hypothyroidism    Macular degeneration, dry    Osteopenia    spine BMD T score - 1.69   PONV (postoperative nausea and vomiting)    pt has vertigo and can not lie flat   Prediabetes    FBS 136  on 08/29/2010; A1c 6.2%   Tongue cancer (HCC)     Vertigo     Current Outpatient Medications  Medication Sig Dispense Refill   ALPRAZolam  (XANAX ) 0.25 MG tablet Take 1 tablet (0.25 mg total) by mouth 2 (two) times daily as needed for anxiety. 30 tablet 2   apixaban  (ELIQUIS ) 2.5 MG TABS tablet Take 1 tablet (2.5 mg total) by mouth 2 (two) times daily. 60 tablet 6   Calcium  Carb-Cholecalciferol  (CALCIUM  600+D3 PO) Take 1 tablet by mouth 2 (two) times daily.     Cholecalciferol  (VITAMIN D3) 1000 UNITS CAPS Take 1,000 Units by mouth daily.     levothyroxine  (SYNTHROID ) 75 MCG tablet Take 1 tablet (75 mcg total) by mouth daily. 90 tablet 2   meclizine  (ANTIVERT ) 25 MG tablet Take 1 tablet (25 mg total) by mouth every 8 (eight) hours as needed for dizziness 40 tablet 2   Multiple Vitamin (MULTIVITAMIN ADULT PO) Take 1 tablet by mouth 2 (two) times a week.     Multiple Vitamins-Minerals (PRESERVISION AREDS PO) Take by mouth in the morning and at bedtime.     simvastatin  (ZOCOR ) 20 MG tablet Take 1 tablet (20 mg total) by mouth at bedtime. 90 tablet 2   valsartan  (DIOVAN ) 40 MG tablet Take 0.5 tablets (20 mg total) by mouth daily.     No current facility-administered medications for this encounter.    Physical Exam: BP (!) 170/80   Pulse 68   Ht 5' 2 (1.575 m)   Wt 46.5 kg   BMI 18.77 kg/m   GEN: Well nourished, well developed in no acute distress CARDIAC: Regular rate and  rhythm, no murmurs, rubs, gallops RESPIRATORY:  Clear to auscultation without rales, wheezing or rhonchi  ABDOMEN: Soft, non-tender, non-distended EXTREMITIES:  No edema; No deformity    Wt Readings from Last 3 Encounters:  06/30/24 46.5 kg  05/19/24 45.8 kg  05/16/24 46.3 kg     EKG today demonstrates  A sense V paced rhythm Vent. rate 68 BPM PR interval 186 ms QRS duration 160 ms QT/QTcB 438/465 ms   Echo 05/07/24 demonstrated   1. Left ventricular ejection fraction, by estimation, is 60 to 65%. The  left ventricle has normal function. The left  ventricle has no regional  wall motion abnormalities. Left ventricular diastolic function could not  be evaluated.   2. Right ventricular systolic function is normal. The right ventricular  size is normal.   3. The mitral valve is normal in structure. Mild mitral valve  regurgitation. No evidence of mitral stenosis.   4. The aortic valve is normal in structure. Aortic valve regurgitation is  trivial. No aortic stenosis is present.   5. The inferior vena cava is normal in size with greater than 50%  respiratory variability, suggesting right atrial pressure of 3 mmHg.     CHA2DS2-VASc Score = 4  The patient's score is based upon: CHF History: 0 HTN History: 1 Diabetes History: 0 Stroke History: 0 Vascular Disease History: 0 Age Score: 2 Gender Score: 1       ASSESSMENT AND PLAN: Paroxysmal Atrial Fibrillation (ICD10:  I48.0) The patient's CHA2DS2-VASc score is 4, indicating a 4.8% annual risk of stroke.   Patient appears to be maintaining SR Continue Eliquis  2.5 mg BID Last device interrogation showed 0% afib burden.  Secondary Hypercoagulable State (ICD10:  D68.69) The patient is at significant risk for stroke/thromboembolism based upon her CHA2DS2-VASc Score of 4.  Continue Apixaban  (Eliquis ). No bleeding issues. Check cbc today.   Advanced AV block S/p PPM, followed by Jessica Sherman  HTN Stable on current regimen   Follow up with Jessica Sherman as scheduled.      [facility] [address] [place], [place] [address] [phone]

## 2024-07-01 ENCOUNTER — Ambulatory Visit (HOSPITAL_COMMUNITY): Payer: Self-pay | Admitting: Physician Assistant

## 2024-07-01 LAB — CBC
Hematocrit: 34.9 % (ref 34.0–46.6)
Hemoglobin: 11.3 g/dL (ref 11.1–15.9)
MCH: 32.8 pg (ref 26.6–33.0)
MCHC: 32.4 g/dL (ref 31.5–35.7)
MCV: 102 fL — ABNORMAL HIGH (ref 79–97)
Platelets: 231 x10E3/uL (ref 150–450)
RBC: 3.44 x10E6/uL — ABNORMAL LOW (ref 3.77–5.28)
RDW: 11.7 % (ref 11.7–15.4)
WBC: 7.8 x10E3/uL (ref 3.4–10.8)

## 2024-07-05 ENCOUNTER — Ambulatory Visit: Payer: Self-pay | Admitting: Cardiology

## 2024-07-14 ENCOUNTER — Other Ambulatory Visit (HOSPITAL_COMMUNITY): Payer: Self-pay

## 2024-07-14 ENCOUNTER — Other Ambulatory Visit: Payer: Self-pay

## 2024-07-16 ENCOUNTER — Other Ambulatory Visit (HOSPITAL_COMMUNITY): Payer: Self-pay

## 2024-07-21 ENCOUNTER — Other Ambulatory Visit: Payer: Self-pay

## 2024-07-21 ENCOUNTER — Other Ambulatory Visit (HOSPITAL_COMMUNITY): Payer: Self-pay

## 2024-07-21 DIAGNOSIS — H353221 Exudative age-related macular degeneration, left eye, with active choroidal neovascularization: Secondary | ICD-10-CM | POA: Diagnosis not present

## 2024-07-21 DIAGNOSIS — H3562 Retinal hemorrhage, left eye: Secondary | ICD-10-CM | POA: Diagnosis not present

## 2024-07-21 DIAGNOSIS — H353112 Nonexudative age-related macular degeneration, right eye, intermediate dry stage: Secondary | ICD-10-CM | POA: Diagnosis not present

## 2024-07-21 DIAGNOSIS — H35363 Drusen (degenerative) of macula, bilateral: Secondary | ICD-10-CM | POA: Diagnosis not present

## 2024-07-21 MED ORDER — OFLOXACIN 0.3 % OP SOLN
OPHTHALMIC | 3 refills | Status: AC
  Filled 2024-07-21: qty 5, 30d supply, fill #0
  Filled 2024-10-15: qty 5, 30d supply, fill #1

## 2024-08-03 ENCOUNTER — Encounter: Payer: Self-pay | Admitting: Internal Medicine

## 2024-08-03 NOTE — Progress Notes (Unsigned)
 Subjective:    Patient ID: Jessica Sherman, female    DOB: 01-30-1931, 89 y.o.   MRN: [id number]     HPI Lorissa is here for follow up of her chronic medical problems.  Has less energy - pushes herself.  She denies feeling tired-states she just does not have the get up and go.  Drinking lots of gatarode and water.   Medications and allergies reviewed with patient and updated if appropriate.  Current Outpatient Medications on File Prior to Visit  Medication Sig Dispense Refill   ALPRAZolam  (XANAX ) 0.25 MG tablet Take 1 tablet (0.25 mg total) by mouth 2 (two) times daily as needed for anxiety. 30 tablet 2   apixaban  (ELIQUIS ) 2.5 MG TABS tablet Take 1 tablet (2.5 mg total) by mouth 2 (two) times daily. 60 tablet 6   Calcium  Carb-Cholecalciferol  (CALCIUM  600+D3 PO) Take 1 tablet by mouth 2 (two) times daily.     Cholecalciferol  (VITAMIN D3) 1000 UNITS CAPS Take 1,000 Units by mouth daily.     levothyroxine  (SYNTHROID ) 75 MCG tablet Take 1 tablet (75 mcg total) by mouth daily. 90 tablet 2   meclizine  (ANTIVERT ) 25 MG tablet Take 1 tablet (25 mg total) by mouth every 8 (eight) hours as needed for dizziness 40 tablet 2   Multiple Vitamin (MULTIVITAMIN ADULT PO) Take 1 tablet by mouth 2 (two) times a week.     Multiple Vitamins-Minerals (PRESERVISION AREDS PO) Take by mouth in the morning and at bedtime.     ofloxacin  (OCUFLOX ) 0.3 % ophthalmic solution Instill 1 drop into left eye three times daily for 3 days. 5 mL 3   simvastatin  (ZOCOR ) 20 MG tablet Take 1 tablet (20 mg total) by mouth at bedtime. 90 tablet 2   valsartan  (DIOVAN ) 40 MG tablet Take 0.5 tablets (20 mg total) by mouth daily.     No current facility-administered medications on file prior to visit.     Review of Systems  Constitutional:  Negative for fever.  Respiratory:  Negative for cough, shortness of breath and wheezing.   Cardiovascular:  Negative for chest pain, palpitations and leg swelling.   Gastrointestinal:  Negative for abdominal pain.  Neurological:  Positive for light-headedness. Negative for headaches.       Objective:   Vitals:   08/04/24 1100  BP: 122/62  Pulse: 68  Temp: 97.9 F (36.6 C)  SpO2: 96%   BP Readings from Last 3 Encounters:  08/04/24 122/62  06/30/24 (!) 170/80  05/19/24 (!) 150/84   Wt Readings from Last 3 Encounters:  08/04/24 101 lb (45.8 kg)  06/30/24 102 lb 9.6 oz (46.5 kg)  05/19/24 101 lb (45.8 kg)   Body mass index is 18.47 kg/m.    Physical Exam Constitutional:      General: She is not in acute distress.    Appearance: Normal appearance.  HENT:     Head: Normocephalic and atraumatic.  Eyes:     Conjunctiva/sclera: Conjunctivae normal.  Cardiovascular:     Rate and Rhythm: Normal rate and regular rhythm.     Heart sounds: Normal heart sounds.  Pulmonary:     Effort: Pulmonary effort is normal. No respiratory distress.     Breath sounds: Normal breath sounds. No wheezing.  Musculoskeletal:     Cervical back: Neck supple.     Right lower leg: No edema.     Left lower leg: No edema.  Lymphadenopathy:     Cervical: No cervical adenopathy.  Skin:    General: Skin is warm and dry.     Findings: No rash.  Neurological:     Mental Status: She is alert. Mental status is at baseline.  Psychiatric:        Mood and Affect: Mood normal.        Behavior: Behavior normal.        Lab Results  Component Value Date   WBC 7.8 06/30/2024   HGB 11.3 06/30/2024   HCT 34.9 06/30/2024   PLT 231 06/30/2024   GLUCOSE 109 (H) 05/08/2024   CHOL 160 02/01/2024   TRIG 79.0 02/01/2024   HDL 77.50 02/01/2024   LDLDIRECT 57.0 01/27/2019   LDLCALC 67 02/01/2024   ALT 20 05/08/2024   AST 28 05/08/2024   NA 126 (L) 05/08/2024   K 4.2 05/08/2024   CL 93 (L) 05/08/2024   CREATININE 0.72 05/08/2024   BUN 15 05/08/2024   CO2 22 05/08/2024   TSH 2.830 05/06/2024   INR 0.9 04/17/2019   HGBA1C 6.3 04/25/2024     Assessment &  Plan:    See Problem List for Assessment and Plan of chronic medical problems.

## 2024-08-03 NOTE — Patient Instructions (Addendum)
      Blood work was ordered.       Medications changes include :   None    A referral was ordered and someone will call you to schedule an appointment.     Return in about 6 months (around 02/01/2025) for follow up.

## 2024-08-04 ENCOUNTER — Ambulatory Visit: Admitting: Internal Medicine

## 2024-08-04 VITALS — BP 122/62 | HR 68 | Temp 97.9°F | Ht 62.0 in | Wt 101.0 lb

## 2024-08-04 DIAGNOSIS — F419 Anxiety disorder, unspecified: Secondary | ICD-10-CM | POA: Diagnosis not present

## 2024-08-04 DIAGNOSIS — E871 Hypo-osmolality and hyponatremia: Secondary | ICD-10-CM

## 2024-08-04 DIAGNOSIS — I10 Essential (primary) hypertension: Secondary | ICD-10-CM

## 2024-08-04 DIAGNOSIS — E538 Deficiency of other specified B group vitamins: Secondary | ICD-10-CM

## 2024-08-04 DIAGNOSIS — M8589 Other specified disorders of bone density and structure, multiple sites: Secondary | ICD-10-CM | POA: Diagnosis not present

## 2024-08-04 DIAGNOSIS — E039 Hypothyroidism, unspecified: Secondary | ICD-10-CM | POA: Diagnosis not present

## 2024-08-04 DIAGNOSIS — Z95811 Presence of heart assist device: Secondary | ICD-10-CM | POA: Insufficient documentation

## 2024-08-04 DIAGNOSIS — Z950 Presence of cardiac pacemaker: Secondary | ICD-10-CM | POA: Diagnosis not present

## 2024-08-04 DIAGNOSIS — E559 Vitamin D deficiency, unspecified: Secondary | ICD-10-CM

## 2024-08-04 DIAGNOSIS — E7800 Pure hypercholesterolemia, unspecified: Secondary | ICD-10-CM

## 2024-08-04 DIAGNOSIS — R7303 Prediabetes: Secondary | ICD-10-CM | POA: Diagnosis not present

## 2024-08-04 DIAGNOSIS — Z8639 Personal history of other endocrine, nutritional and metabolic disease: Secondary | ICD-10-CM | POA: Diagnosis not present

## 2024-08-04 LAB — CBC WITH DIFFERENTIAL/PLATELET
Basophils Absolute: 0 K/uL (ref 0.0–0.1)
Basophils Relative: 0.5 % (ref 0.0–3.0)
Eosinophils Absolute: 0.1 K/uL (ref 0.0–0.7)
Eosinophils Relative: 1.3 % (ref 0.0–5.0)
HCT: 34.6 % — ABNORMAL LOW (ref 36.0–46.0)
Hemoglobin: 12.5 g/dL (ref 12.0–15.0)
Lymphocytes Relative: 14.3 % (ref 12.0–46.0)
Lymphs Abs: 0.7 K/uL (ref 0.7–4.0)
MCHC: 36.1 g/dL — ABNORMAL HIGH (ref 30.0–36.0)
MCV: 101 fl — ABNORMAL HIGH (ref 78.0–100.0)
Monocytes Absolute: 0.7 K/uL (ref 0.1–1.0)
Monocytes Relative: 13.7 % — ABNORMAL HIGH (ref 3.0–12.0)
Neutro Abs: 3.4 K/uL (ref 1.4–7.7)
Neutrophils Relative %: 70.2 % (ref 43.0–77.0)
Platelets: 212 K/uL (ref 150.0–400.0)
RBC: 3.42 Mil/uL — ABNORMAL LOW (ref 3.87–5.11)
RDW: 13 % (ref 11.5–15.5)
WBC: 4.9 K/uL (ref 4.0–10.5)

## 2024-08-04 LAB — COMPREHENSIVE METABOLIC PANEL WITH GFR
ALT: 12 U/L (ref 0–35)
AST: 22 U/L (ref 0–37)
Albumin: 4.4 g/dL (ref 3.5–5.2)
Alkaline Phosphatase: 48 U/L (ref 39–117)
BUN: 18 mg/dL (ref 6–23)
CO2: 31 meq/L (ref 19–32)
Calcium: 9.8 mg/dL (ref 8.4–10.5)
Chloride: 95 meq/L — ABNORMAL LOW (ref 96–112)
Creatinine, Ser: 0.79 mg/dL (ref 0.40–1.20)
GFR: 65.19 mL/min (ref >60.00)
Glucose, Bld: 95 mg/dL (ref 70–99)
Potassium: 4.4 meq/L (ref 3.5–5.1)
Sodium: 131 meq/L — ABNORMAL LOW (ref 135–145)
Total Bilirubin: 0.5 mg/dL (ref 0.2–1.2)
Total Protein: 7.4 g/dL (ref 6.0–8.3)

## 2024-08-04 LAB — LIPID PANEL
Cholesterol: 161 mg/dL (ref 0–200)
HDL: 76.4 mg/dL (ref >39.00)
LDL Cholesterol: 68 mg/dL (ref 0–99)
NonHDL: 84.67
Total CHOL/HDL Ratio: 2
Triglycerides: 85 mg/dL (ref 0.0–149.0)
VLDL: 17 mg/dL (ref 0.0–40.0)

## 2024-08-04 LAB — IBC PANEL
Iron: 85 ug/dL (ref 42–145)
Saturation Ratios: 18.4 % — ABNORMAL LOW (ref 20.0–50.0)
TIBC: 462 ug/dL — ABNORMAL HIGH (ref 250.0–450.0)
Transferrin: 330 mg/dL (ref 212.0–360.0)

## 2024-08-04 LAB — HEMOGLOBIN A1C: Hgb A1c MFr Bld: 5.8 % (ref 4.6–6.5)

## 2024-08-04 LAB — TSH: TSH: 0.38 u[IU]/mL (ref 0.35–5.50)

## 2024-08-04 LAB — FERRITIN: Ferritin: 70.6 ng/mL (ref 10.0–291.0)

## 2024-08-04 NOTE — Assessment & Plan Note (Signed)
 Chronic  Clinically euthyroid Check tsh and will titrate med dose if needed Continue levothyroxine  75 mcg daily

## 2024-08-04 NOTE — Assessment & Plan Note (Signed)
Chronic Regular exercise and healthy diet encouraged Check lipid panel, CMP Continue simvastatin 20 mg daily 

## 2024-08-04 NOTE — Assessment & Plan Note (Signed)
 Chronic Blood pressure well-controlled CBC, CMP Monitor BP at home  Continue valsartan  to 20 mg daily

## 2024-08-04 NOTE — Assessment & Plan Note (Signed)
 Chronic History of iron deficiency Not currently taking any iron wonders if she should restart Check iron panel, CBC

## 2024-08-04 NOTE — Assessment & Plan Note (Signed)
 Chronic Controlled Continue alprazolam  0.25 mg twice daily as needed

## 2024-08-04 NOTE — Assessment & Plan Note (Signed)
 Chronic Taking vitamin B12 B12 level recently normal

## 2024-08-04 NOTE — Assessment & Plan Note (Signed)
 Chronic Lab Results  Component Value Date   HGBA1C 6.3 04/25/2024   Check a1c

## 2024-08-04 NOTE — Assessment & Plan Note (Signed)
 Chronic dexa due-deferred Taking calcium , vit d Exercises regularly Vitamin D  level has been normal

## 2024-08-04 NOTE — Assessment & Plan Note (Signed)
 Chronic Taking vitamin d  daily Vitamin D  level in August was good

## 2024-08-04 NOTE — Assessment & Plan Note (Signed)
 Chronic Pacemaker placed for second-degree AV block Following with cardiology

## 2024-08-04 NOTE — Assessment & Plan Note (Signed)
 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 intake and dehydration She is eating is much as she can and will try to increase how much she is eating and drinking plenty of fluids CMP

## 2024-08-05 ENCOUNTER — Ambulatory Visit: Payer: Self-pay | Admitting: Internal Medicine

## 2024-08-13 ENCOUNTER — Other Ambulatory Visit (HOSPITAL_BASED_OUTPATIENT_CLINIC_OR_DEPARTMENT_OTHER): Payer: Self-pay

## 2024-08-14 ENCOUNTER — Ambulatory Visit: Admitting: Cardiology

## 2024-08-20 ENCOUNTER — Ambulatory Visit: Attending: Cardiology | Admitting: Cardiology

## 2024-08-20 VITALS — BP 160/78 | HR 79 | Ht 63.0 in | Wt 102.2 lb

## 2024-08-20 DIAGNOSIS — I10 Essential (primary) hypertension: Secondary | ICD-10-CM | POA: Diagnosis not present

## 2024-08-20 DIAGNOSIS — I480 Paroxysmal atrial fibrillation: Secondary | ICD-10-CM

## 2024-08-20 DIAGNOSIS — I4891 Unspecified atrial fibrillation: Secondary | ICD-10-CM | POA: Insufficient documentation

## 2024-08-20 DIAGNOSIS — D6869 Other thrombophilia: Secondary | ICD-10-CM

## 2024-08-20 DIAGNOSIS — I441 Atrioventricular block, second degree: Secondary | ICD-10-CM | POA: Diagnosis not present

## 2024-08-20 DIAGNOSIS — Z950 Presence of cardiac pacemaker: Secondary | ICD-10-CM | POA: Diagnosis not present

## 2024-08-20 LAB — CUP PACEART INCLINIC DEVICE CHECK
Battery Remaining Longevity: 99 mo
Battery Voltage: 3.02 V
Brady Statistic RA Percent Paced: 38 %
Brady Statistic RV Percent Paced: 71 %
Date Time Interrogation Session: 20251210154044
Implantable Lead Connection Status: 753985
Implantable Lead Connection Status: 753985
Implantable Lead Implant Date: 20250827
Implantable Lead Implant Date: 20250827
Implantable Lead Location: 753859
Implantable Lead Location: 753860
Implantable Pulse Generator Implant Date: 20250827
Lead Channel Impedance Value: 400 Ohm
Lead Channel Impedance Value: 550 Ohm
Lead Channel Pacing Threshold Amplitude: 0.5 V
Lead Channel Pacing Threshold Amplitude: 0.5 V
Lead Channel Pacing Threshold Amplitude: 0.75 V
Lead Channel Pacing Threshold Amplitude: 0.75 V
Lead Channel Pacing Threshold Pulse Width: 0.5 ms
Lead Channel Pacing Threshold Pulse Width: 0.5 ms
Lead Channel Pacing Threshold Pulse Width: 0.5 ms
Lead Channel Pacing Threshold Pulse Width: 0.5 ms
Lead Channel Sensing Intrinsic Amplitude: 10 mV
Lead Channel Sensing Intrinsic Amplitude: 5 mV
Lead Channel Setting Pacing Amplitude: 0.75 V
Lead Channel Setting Pacing Amplitude: 3.5 V
Lead Channel Setting Pacing Pulse Width: 0.5 ms
Lead Channel Setting Sensing Sensitivity: 4 mV
Pulse Gen Model: 2272
Pulse Gen Serial Number: 8296346

## 2024-08-20 NOTE — Progress Notes (Signed)
 Electrophysiology Clinic Note    Date:  08/20/2024  Patient ID:  [name], [name] 01/18/1931, MRN [id number] PCP:  Geofm Glade PARAS, MD  Cardiologist:  None  Electrophysiologist:  Fonda Kitty, MD     Discussed the use of AI scribe software for clinical note transcription with the patient, who gave verbal consent to proceed.   Patient Profile    Chief Complaint: 3mon PPM follow-up  History of Present Illness: Jessica Sherman is a 89 y.o. female with PMH notable for high grade HB s/p PPM, parox Afib, HTN, HLD, breast cancer s/p L mastectomy treated w herceptin , IDA; seen today for Fonda Kitty, MD for routine electrophysiology follow-up s/p Pacemaker implant.  She presented to [facility] ER 04/2024 after syncopal episode found to be in 2:1 HB and is s/p PPM implant. Device clinic was alerted 9/5 for AFib episode and she saw AF clinic quickly thereafter and was started on eliquis .   On follow-up today, she has no concerns related to her PPM. She thinks she has slightly less energy than she had pre-PPM. She is tolerating eliquis  well without bleeding concerns.  She denies chest pain, chest pressure, palpitations. She denies any further syncopal or presyncope episodes.   She states she has white coat HTN, does not check BP regularly at home.    Her daughter joins for visit.  Arrhythmia/Device History St. Jude dual chamber PPM, imp 04/2024; dx high grade HB   AAD -  None     ROS:  Please see the history of present illness. All other systems are reviewed and otherwise negative.    Physical Exam    VS:  BP (!) 160/78 (BP Location: Right Arm, Patient Position: Sitting, Cuff Size: Normal)   Pulse 79   Ht 5' 3 (1.6 m)   Wt 102 lb 3.2 oz (46.4 kg)   SpO2 96%   BMI 18.10 kg/m  BMI: Body mass index is 18.1 kg/m.           Wt Readings from Last 3 Encounters:  08/20/24 102 lb 3.2 oz (46.4 kg)  08/04/24 101 lb (45.8 kg)  06/30/24 102 lb 9.6 oz (46.5 kg)     GEN-  The patient is frail-appearing, alert and oriented x 3 today.   Lungs- Clear to ausculation bilaterally, normal work of breathing.  Heart- Regular rate and rhythm, no murmurs, rubs or gallops Extremities- No peripheral edema, warm, dry Skin-  device pocket well-healed, no tethering   Device interrogation done today and reviewed by myself:  Battery 6-8 years Lead thresholds, impedence, sensing stable  Increased AV delay to allow intrinsic conduction Very brief SVT episodes, appears Atach No other changes made today   Studies Reviewed   Previous EP, cardiology notes.    EKG is ordered. Personal review of EKG from today shows:    EKG Interpretation Date/Time:  Wednesday August 20 2024 14:24:12 EST Ventricular Rate:  79 PR Interval:  164 QRS Duration:  144 QT Interval:  422 QTC Calculation: 483 R Axis:   9  Text Interpretation: Atrial-sensed ventricular-paced rhythm Reconfirmed by Fidelia Cathers [phone] on 08/20/2024 3:33:09 PM     TTE, 05/07/2024  1. Left ventricular ejection fraction, by estimation, is 60 to 65%. The left ventricle has normal function. The left ventricle has no regional wall motion abnormalities. Left ventricular diastolic function could not be evaluated.   2. Right ventricular systolic function is normal. The right ventricular size is normal.   3. The mitral  valve is normal in structure. Mild mitral valve regurgitation. No evidence of mitral stenosis.   4. The aortic valve is normal in structure. Aortic valve regurgitation is trivial. No aortic stenosis is present.   5. The inferior vena cava is normal in size with greater than 50% respiratory variability, suggesting right atrial pressure of 3 mmHg.   TTE, 05/31/2017 - Left ventricle: The cavity size was normal. There was mild focal basal hypertrophy of the septum. Systolic function was normal. The estimated ejection fraction was in the range of 55% to 60%. Wall motion was normal; there were no regional wall  motion abnormalities. Doppler parameters are consistent with abnormal left ventricular relaxation (grade 1 diastolic dysfunction).  - Aortic valve: There was trivial regurgitation.  - Mitral valve: There was mild regurgitation.  - Right ventricle: The cavity size was mildly dilated.  - Right atrium: The atrium was mildly dilated.  - Tricuspid valve: There was mild-moderate regurgitation.  - Pulmonary arteries: PA peak pressure: 66 mm Hg (S).  - Impressions: LS&' 9.3 GLS - 17.8%    Assessment and Plan     #) high grade HB s/p PPM Battery good Increased AV delay to allow intrinsic conduction, 1:1 today Lead measurements stable  #) parox AFib Overall low burden, asymptomatic during episodes Continue to monitor via PPM  #) Hypercoag d/t afib CHA2DS2-VASc Score = at least 4 [CHF History: 0, HTN History: 1, Diabetes History: 0, Stroke History: 0, Vascular Disease History: 0, Age Score: 2, Gender Score: 1].  Therefore, the patient's annual risk of stroke is 4.8 %.    Stroke ppx - 2.5mg  eliquis  BID, appropriately dosed No bleeding concerns  #) HTN Elevated in office today, unsure of control at home Encouraged patient to check BP more often at home and record measurements Bring BP log to PCP appt for further mgmt Continue 20mg  valsartan  daily       Current medicines are reviewed at length with the patient today.   The patient does not have concerns regarding her medicines.  The following changes were made today:  none  Labs/ tests ordered today include:  Orders Placed This Encounter  Procedures   EKG 12-Lead     Disposition: Follow up with Dr. Kennyth or EP APP in 6 months   Signed, Kimyata Milich, NP  08/20/24  3:33 PM  Electrophysiology [facility]

## 2024-08-20 NOTE — Patient Instructions (Signed)
 Medication Instructions:  Your physician recommends that you continue on your current medications as directed. Please refer to the Current Medication list given to you today.   *If you need a refill on your cardiac medications before your next appointment, please call your pharmacy*  Lab Work: No labs ordered today  If you have labs (blood work) drawn today and your tests are completely normal, you will receive your results only by: MyChart Message (if you have MyChart) OR A paper copy in the mail If you have any lab test that is abnormal or we need to change your treatment, we will call you to review the results.  Testing/Procedures: No test ordered today   Follow-Up: At [facility], you and your health needs are our priority.  As part of our continuing mission to provide you with exceptional heart care, our providers are all part of one team.  This team includes your primary Cardiologist (physician) and Advanced Practice Providers or APPs (Physician Assistants and Nurse Practitioners) who all work together to provide you with the care you need, when you need it.  Your next appointment:   5-6 month(s)  Provider:   Suzann Riddle, NP    We recommend signing up for the patient portal called MyChart.  Sign up information is provided on this After Visit Summary.  MyChart is used to connect with patients for Virtual Visits (Telemedicine).  Patients are able to view lab/test results, encounter notes, upcoming appointments, etc.  Non-urgent messages can be sent to your provider as well.   To learn more about what you can do with MyChart, go to [email or web address].

## 2024-09-07 ENCOUNTER — Other Ambulatory Visit (HOSPITAL_COMMUNITY): Payer: Self-pay

## 2024-09-18 ENCOUNTER — Ambulatory Visit

## 2024-09-18 DIAGNOSIS — I441 Atrioventricular block, second degree: Secondary | ICD-10-CM

## 2024-09-19 LAB — CUP PACEART REMOTE DEVICE CHECK
Battery Remaining Longevity: 92 mo
Battery Remaining Percentage: 95.5 %
Battery Voltage: 3.01 V
Brady Statistic AP VP Percent: 1 %
Brady Statistic AP VS Percent: 32 %
Brady Statistic AS VP Percent: 3.1 %
Brady Statistic AS VS Percent: 65 %
Brady Statistic RA Percent Paced: 31 %
Brady Statistic RV Percent Paced: 3.2 %
Date Time Interrogation Session: 20260108020015
Implantable Lead Connection Status: 753985
Implantable Lead Connection Status: 753985
Implantable Lead Implant Date: 20250827
Implantable Lead Implant Date: 20250827
Implantable Lead Location: 753859
Implantable Lead Location: 753860
Implantable Pulse Generator Implant Date: 20250827
Lead Channel Impedance Value: 400 Ohm
Lead Channel Impedance Value: 550 Ohm
Lead Channel Pacing Threshold Amplitude: 0.5 V
Lead Channel Pacing Threshold Amplitude: 0.75 V
Lead Channel Pacing Threshold Pulse Width: 0.5 ms
Lead Channel Pacing Threshold Pulse Width: 0.5 ms
Lead Channel Sensing Intrinsic Amplitude: 12 mV
Lead Channel Sensing Intrinsic Amplitude: 5 mV
Lead Channel Setting Pacing Amplitude: 0.75 V
Lead Channel Setting Pacing Amplitude: 3.5 V
Lead Channel Setting Pacing Pulse Width: 0.5 ms
Lead Channel Setting Sensing Sensitivity: 4 mV
Pulse Gen Model: 2272
Pulse Gen Serial Number: 8296346

## 2024-09-20 ENCOUNTER — Ambulatory Visit: Payer: Self-pay | Admitting: Cardiology

## 2024-09-23 NOTE — Progress Notes (Signed)
 Remote PPM Transmission

## 2024-10-08 ENCOUNTER — Other Ambulatory Visit: Payer: Self-pay | Admitting: Internal Medicine

## 2024-10-08 ENCOUNTER — Other Ambulatory Visit: Payer: Self-pay

## 2024-10-08 MED ORDER — LEVOTHYROXINE SODIUM 75 MCG PO TABS
75.0000 ug | ORAL_TABLET | Freq: Every day | ORAL | 2 refills | Status: AC
  Filled 2024-10-08: qty 90, 90d supply, fill #0

## 2024-10-09 ENCOUNTER — Other Ambulatory Visit (HOSPITAL_COMMUNITY): Payer: Self-pay

## 2024-10-09 ENCOUNTER — Other Ambulatory Visit: Payer: Self-pay

## 2024-10-09 ENCOUNTER — Encounter: Payer: Self-pay | Admitting: Pharmacist

## 2024-10-15 ENCOUNTER — Other Ambulatory Visit: Payer: Self-pay | Admitting: Internal Medicine

## 2024-10-16 ENCOUNTER — Other Ambulatory Visit: Payer: Self-pay

## 2024-10-16 ENCOUNTER — Other Ambulatory Visit (HOSPITAL_COMMUNITY): Payer: Self-pay

## 2024-10-16 MED ORDER — SIMVASTATIN 20 MG PO TABS
20.0000 mg | ORAL_TABLET | Freq: Every evening | ORAL | 2 refills | Status: AC
  Filled 2024-10-16: qty 90, 90d supply, fill #0

## 2024-12-18 ENCOUNTER — Encounter

## 2025-01-19 ENCOUNTER — Ambulatory Visit: Admitting: Cardiology

## 2025-02-03 ENCOUNTER — Ambulatory Visit: Admitting: Internal Medicine

## 2025-02-04 ENCOUNTER — Ambulatory Visit

## 2025-03-19 ENCOUNTER — Encounter

## 2025-06-18 ENCOUNTER — Encounter
